# Patient Record
Sex: Female | Born: 1950 | Race: White | Hispanic: No | Marital: Single | Smoking: Never smoker
Health system: Southern US, Community
[De-identification: ages and names within clinical notes are randomized; demographics above are authoritative.]

## PROBLEM LIST (undated history)

## (undated) DIAGNOSIS — L02419 Cutaneous abscess of limb, unspecified: Secondary | ICD-10-CM

## (undated) DIAGNOSIS — L03119 Cellulitis of unspecified part of limb: Secondary | ICD-10-CM

## (undated) DIAGNOSIS — G35 Multiple sclerosis: Secondary | ICD-10-CM

## (undated) DIAGNOSIS — R32 Unspecified urinary incontinence: Secondary | ICD-10-CM

## (undated) DIAGNOSIS — D332 Benign neoplasm of brain, unspecified: Secondary | ICD-10-CM

## (undated) DIAGNOSIS — D696 Thrombocytopenia, unspecified: Secondary | ICD-10-CM

## (undated) DIAGNOSIS — R5383 Other fatigue: Secondary | ICD-10-CM

## (undated) DIAGNOSIS — G709 Myoneural disorder, unspecified: Secondary | ICD-10-CM

## (undated) DIAGNOSIS — I10 Essential (primary) hypertension: Secondary | ICD-10-CM

## (undated) DIAGNOSIS — Z9289 Personal history of other medical treatment: Secondary | ICD-10-CM

## (undated) DIAGNOSIS — G0491 Myelitis, unspecified: Secondary | ICD-10-CM

## (undated) DIAGNOSIS — Z9889 Other specified postprocedural states: Secondary | ICD-10-CM

## (undated) DIAGNOSIS — T4145XA Adverse effect of unspecified anesthetic, initial encounter: Secondary | ICD-10-CM

## (undated) DIAGNOSIS — T8859XA Other complications of anesthesia, initial encounter: Secondary | ICD-10-CM

## (undated) DIAGNOSIS — M1710 Unilateral primary osteoarthritis, unspecified knee: Secondary | ICD-10-CM

## (undated) DIAGNOSIS — C801 Malignant (primary) neoplasm, unspecified: Secondary | ICD-10-CM

## (undated) DIAGNOSIS — R112 Nausea with vomiting, unspecified: Secondary | ICD-10-CM

## (undated) DIAGNOSIS — W19XXXA Unspecified fall, initial encounter: Secondary | ICD-10-CM

## (undated) HISTORY — DX: Essential (primary) hypertension: I10

## (undated) HISTORY — DX: Benign neoplasm of brain, unspecified: D33.2

---

## 1999-06-11 DIAGNOSIS — G709 Myoneural disorder, unspecified: Secondary | ICD-10-CM

## 1999-06-11 HISTORY — DX: Myoneural disorder, unspecified: G70.9

## 2009-06-10 HISTORY — PX: ABDOMINAL HYSTERECTOMY: SHX81

## 2012-01-13 ENCOUNTER — Ambulatory Visit: Payer: Self-pay | Admitting: General Surgery

## 2012-04-24 ENCOUNTER — Ambulatory Visit: Payer: Self-pay | Admitting: Gastroenterology

## 2012-06-10 DIAGNOSIS — C801 Malignant (primary) neoplasm, unspecified: Secondary | ICD-10-CM

## 2012-06-10 HISTORY — PX: BREAST SURGERY: SHX581

## 2012-06-10 HISTORY — DX: Malignant (primary) neoplasm, unspecified: C80.1

## 2013-04-15 DIAGNOSIS — E669 Obesity, unspecified: Secondary | ICD-10-CM | POA: Insufficient documentation

## 2013-04-29 DIAGNOSIS — I10 Essential (primary) hypertension: Secondary | ICD-10-CM | POA: Insufficient documentation

## 2013-04-29 DIAGNOSIS — D320 Benign neoplasm of cerebral meninges: Secondary | ICD-10-CM | POA: Insufficient documentation

## 2013-04-30 DIAGNOSIS — D332 Benign neoplasm of brain, unspecified: Secondary | ICD-10-CM | POA: Insufficient documentation

## 2013-06-10 HISTORY — PX: BRAIN SURGERY: SHX531

## 2014-04-27 ENCOUNTER — Ambulatory Visit: Payer: Self-pay | Admitting: Hematology and Oncology

## 2014-04-27 LAB — LACTATE DEHYDROGENASE: LDH: 235 U/L (ref 81–246)

## 2014-04-27 LAB — FOLATE: FOLIC ACID: 18.5 ng/mL (ref 3.1–100.0)

## 2014-04-27 LAB — FIBRIN DEGRADATION PROD.(ARMC ONLY): Fibrin Degradation Prod.: <10 ug/ml (ref 2.1–7.7)

## 2014-04-27 LAB — CBC CANCER CENTER
Basophil: 1 %
Eosinophil: 2 %
HCT: 40.1 % (ref 35.0–47.0)
HGB: 12.8 g/dL (ref 12.0–16.0)
Lymphocytes: 31 %
MCH: 28.9 pg (ref 26.0–34.0)
MCHC: 31.8 g/dL — AB (ref 32.0–36.0)
MCV: 91 fL (ref 80–100)
Monocytes: 1 %
Platelet: 49 x10 3/mm — ABNORMAL LOW (ref 150–440)
RBC: 4.41 10*6/uL (ref 3.80–5.20)
RDW: 13.8 % (ref 11.5–14.5)
Segmented Neutrophils: 65 %
WBC: 6.7 x10 3/mm (ref 3.6–11.0)

## 2014-04-27 LAB — IRON AND TIBC
IRON SATURATION: 17 %
Iron Bind.Cap.(Total): 398 ug/dL (ref 250–450)
Iron: 68 ug/dL (ref 50–170)
Unbound Iron-Bind.Cap.: 330 ug/dL

## 2014-04-27 LAB — RETICULOCYTES
Absolute Retic Count: 0.0583 10*6/uL (ref 0.019–0.186)
RETICULOCYTE: 1.32 % (ref 0.4–3.1)

## 2014-04-27 LAB — FIBRINOGEN: FIBRINOGEN: 496 mg/dL — AB (ref 210–470)

## 2014-04-27 LAB — PROTIME-INR
INR: 1
PROTHROMBIN TIME: 12.7 s (ref 11.5–14.7)

## 2014-04-27 LAB — FERRITIN: Ferritin (ARMC): 75 ng/mL (ref 8–388)

## 2014-04-27 LAB — APTT: ACTIVATED PTT: 29.9 s (ref 23.6–35.9)

## 2014-04-29 LAB — URINE IEP, RANDOM

## 2014-04-29 LAB — PROT IMMUNOELECTROPHORES(ARMC)

## 2014-05-10 ENCOUNTER — Ambulatory Visit: Payer: Self-pay | Admitting: Hematology and Oncology

## 2014-06-10 ENCOUNTER — Ambulatory Visit: Payer: Self-pay | Admitting: Hematology and Oncology

## 2014-09-15 DIAGNOSIS — D332 Benign neoplasm of brain, unspecified: Secondary | ICD-10-CM

## 2014-09-15 HISTORY — DX: Benign neoplasm of brain, unspecified: D33.2

## 2015-02-24 DIAGNOSIS — I10 Essential (primary) hypertension: Secondary | ICD-10-CM | POA: Insufficient documentation

## 2015-02-24 DIAGNOSIS — G35 Multiple sclerosis: Secondary | ICD-10-CM | POA: Insufficient documentation

## 2015-02-24 DIAGNOSIS — E785 Hyperlipidemia, unspecified: Secondary | ICD-10-CM | POA: Insufficient documentation

## 2015-08-16 ENCOUNTER — Encounter: Payer: Self-pay | Admitting: Family Medicine

## 2015-08-16 ENCOUNTER — Ambulatory Visit (INDEPENDENT_AMBULATORY_CARE_PROVIDER_SITE_OTHER): Payer: Medicaid Other | Admitting: Family Medicine

## 2015-08-16 VITALS — BP 151/77 | HR 72 | Temp 98.3°F | Ht 66.6 in | Wt 303.0 lb

## 2015-08-16 DIAGNOSIS — S61259A Open bite of unspecified finger without damage to nail, initial encounter: Secondary | ICD-10-CM | POA: Diagnosis not present

## 2015-08-16 DIAGNOSIS — W5501XA Bitten by cat, initial encounter: Secondary | ICD-10-CM

## 2015-08-16 MED ORDER — CLINDAMYCIN HCL 300 MG PO CAPS: 300.0000 mg | ORAL_CAPSULE | Freq: Four times a day (QID) | ORAL | Status: DC

## 2015-08-16 MED ORDER — SULFAMETHOXAZOLE-TRIMETHOPRIM 800-160 MG PO TABS: 1.0000 | ORAL_TABLET | Freq: Two times a day (BID) | ORAL | Status: DC

## 2015-08-16 NOTE — Progress Notes (Signed)
BP 151/77 mmHg  Pulse 72  Temp(Src) 98.3 F (36.8 C)  Ht 5' 6.6" (1.692 m)  Wt 303 lb (137.44 kg)  BMI 48.01 kg/m2  SpO2 96%   Subjective:    Patient ID: Leslie Smith, female    DOB: 04-10-1951, 65 y.o.   MRN: [id number]  HPI: Leslie Smith is a 65 y.o. female  Chief Complaint  Patient presents with  . Animal Bite    left hand   patient with Bite to left thumb feels went down to the bone is treated and released from the emergency room placed on Augmentin 875 twice a day initially seemed to help some and then this not now some is gotten red inflamed tender no localization no fever Been getting significantly worse  Relevant past medical, surgical, family and social history reviewed and updated as indicated. Interim medical history since our last visit reviewed. Allergies and medications reviewed and updated.  Review of Systems  Constitutional: Positive for fatigue. Negative for fever.  HENT: Negative.   Respiratory: Negative.   Cardiovascular: Negative.     Per HPI unless specifically indicated above     Objective:    BP 151/77 mmHg  Pulse 72  Temp(Src) 98.3 F (36.8 C)  Ht 5' 6.6" (1.692 m)  Wt 303 lb (137.44 kg)  BMI 48.01 kg/m2  SpO2 96%  Wt Readings from Last 3 Encounters:  08/16/15 303 lb (137.44 kg)  09/15/14 299 lb (135.626 kg)    Physical Exam  Constitutional: She is oriented to person, place, and time. She appears well-developed and well-nourished. No distress.  HENT:  Head: Normocephalic and atraumatic.  Right Ear: Hearing normal.  Left Ear: Hearing normal.  Nose: Nose normal.  Eyes: Conjunctivae and lids are normal. Right eye exhibits no discharge. Left eye exhibits no discharge. No scleral icterus.  Pulmonary/Chest: Effort normal. No respiratory distress.  Musculoskeletal: Normal range of motion.  Neurological: She is alert and oriented to person, place, and time.  Skin: Skin is intact. No rash noted.  Left thumb with puncture wound inflamed  and swollen  Psychiatric: She has a normal mood and affect. Her speech is normal and behavior is normal. Judgment and thought content normal. Cognition and memory are normal.    Results for orders placed or performed in visit on 04/27/14  Prot. Immunoelectrophores [facility])  Result Value Ref Range   Prot Immunoelectrophores      PROT IMMUNOELECTROPHORES IFE and PE, Serum Immunoglobulin G, Qn, Serum     [   936 mg/dL            ]          [phone] Immunoglobulin A, Qn, Serum     [   214 mg/dL            ]            91-414 Immunoglobulin M, Qn, Serum     [   117 mg/dL            ]            40-230 Protein, Total, Serum           [   6.5 g/dL             ]           6.0-8.5 Albumin                         [  3.6 g/dL             ]           3.2-5.6 Alpha-1-Globulin                [   0.2 g/dL             ]           0.1-0.4 Alpha-2-Globulin                [   0.7 g/dL             ]           0.4-1.2 Beta Globulin                   [   1.0 g/dL             ]           0.6-1.3 Gamma Globulin                  [   0.9 g/dL             ]           0.5-1.6 M-Spike                         [   Not Observed g/dL    ]      Not Observed Globulin, Total                 [   2.9 g/dL             ]           2.0-4.5 A/G Ratio                       [   1.3                  ]           0.7-2.0 Immunofixation Resu lt, Serum    [   Final Report         ]                   An apparent normal immunofixation pattern. Please note:                    [   Final Report         ]                   Protein electrophoresis scan will follow via computer, mail, or courier delivery.               [place]            No: [id number]           [place], [place] [address]           Lindon Romp, MD         [phone]     Urine IEP, RANDOM  Result Value Ref Range   Urine IEP, Random      ========== TEST NAME ==========  ========= RESULTS =========  = REFERENCE RANGE =  URINE IEP,  RANDOM IFE and PE, Random Urine Protein,Total,Urine             [   10.1 mg/dL           ]  0.0-15.0 Albumin, U                      [   70.6 %               ]                   Alpha-1-Globulin, U             [   1.7 %                ]                   Alpha-2-Globulin, U             [   5.7 %                ]                   Beta Globulin, U                [   13.2 %               ]                   Gamma Globulin, U               [   8.8 %                ]                   M-Spike, %                      [   Not Observed %       ]      Not Observed Immunofixation Result, Urine    [   Final Report         ]                   An apparent normal immunofixation pattern.   Note:                         [   Final Report         ]                   Protein electrophoresis scan will follow via computer, mail, or courier delivery.               [place]             No: [id number]           [address], [place], [place] [address]           Lindon Romp, MD         [phone]     [facility]  Result Value Ref Range   WBC 6.7 3.6-11.0 x10 3/mm    RBC 4.41 3.80-5.20 x10 6/mm    HGB 12.8 12.0-16.0 g/dL   HCT 40.1 35.0-47.0 %   MCV 91 80-100 fL   MCH 28.9 26.0-34.0 pg   MCHC 31.8 (L) 32.0-36.0 g/dL   RDW 13.8 11.5-14.5 %   Platelet 49 (L) 150-440 x10 3/mm    Segmented Neutrophils 65 %   Lymphocytes 31 %   Monocytes 1 %   Eosinophil 2 %  Basophil 1 %   Comment - H1-Com1 HYPOCHROMIA    Comment - H1-Com2 PLTS VARIED IN SIZE   Reticulocytes  Result Value Ref Range   Reticulocyte 1.32 0.4-3.1 %   Absolute Retic Count 0.0583 0.019-0.186 x10 6/uL  Ferritin  Result Value Ref Range   Ferritin ([facility]) 75 8-388 ng/mL  Folate  Result Value Ref Range   Folic Acid [id number] 0000000 ng/mL  Lactate dehydrogenase  Result Value Ref Range   LDH 235 81-246 Unit/L  Protime-INR  Result Value Ref Range   Prothrombin Time 12.7 11.5-14.7 secs   INR 1.0   APTT   Result Value Ref Range   Activated PTT 29.9 23.6-35.9 secs  Fibrin Degradation Products [facility])  Result Value Ref Range   Fibrin Degradation Prod. < 10 2.1-7.7 ug/ml  Fibrinogen  Result Value Ref Range   Fibrinogen 496 (H) 210-470 mg/dL  Iron and TIBC  Result Value Ref Range   Iron 68 50-170 mcg/dL   Iron Bind.Cap.(Total) 398 250-450 mcg/dL   Unbound Iron-Bind.Cap. 330 mcg/dL   Iron Saturation 17 %      Assessment & Plan:   Problem List Items Addressed This Visit    None    Visit Diagnoses    Cat bite of finger, initial encounter    -  Primary    Discuss hot compresses use of antibiotics will give clindamycin and Septra if not better to orthopedics to consider I&D        Follow up plan: Return if symptoms worsen or fail to improve, for As scheduled.

## 2015-08-21 ENCOUNTER — Other Ambulatory Visit: Payer: Self-pay | Admitting: Family Medicine

## 2015-08-25 ENCOUNTER — Encounter (HOSPITAL_COMMUNITY): Payer: Self-pay | Admitting: Certified Registered Nurse Anesthetist

## 2015-08-25 ENCOUNTER — Ambulatory Visit (HOSPITAL_COMMUNITY)
Admission: RE | Admit: 2015-08-25 | Discharge: 2015-08-25 | Disposition: A | Discharge status: Home / Self Care | Payer: Medicaid Other | Source: Ambulatory Visit | Attending: Orthopedic Surgery | Admitting: Orthopedic Surgery

## 2015-08-25 ENCOUNTER — Encounter (HOSPITAL_COMMUNITY)
Admission: RE | Disposition: A | Discharge status: Home / Self Care | Payer: Self-pay | Source: Ambulatory Visit | Attending: Orthopedic Surgery

## 2015-08-25 ENCOUNTER — Ambulatory Visit (HOSPITAL_COMMUNITY): Payer: Medicaid Other | Admitting: Certified Registered Nurse Anesthetist

## 2015-08-25 ENCOUNTER — Telehealth: Payer: Self-pay | Admitting: Family Medicine

## 2015-08-25 ENCOUNTER — Other Ambulatory Visit: Payer: Self-pay | Admitting: Family Medicine

## 2015-08-25 DIAGNOSIS — S61052A Open bite of left thumb without damage to nail, initial encounter: Secondary | ICD-10-CM | POA: Diagnosis not present

## 2015-08-25 DIAGNOSIS — S66212A Strain of extensor muscle, fascia and tendon of left thumb at wrist and hand level, initial encounter: Secondary | ICD-10-CM | POA: Diagnosis not present

## 2015-08-25 DIAGNOSIS — L089 Local infection of the skin and subcutaneous tissue, unspecified: Secondary | ICD-10-CM | POA: Diagnosis not present

## 2015-08-25 DIAGNOSIS — W5501XA Bitten by cat, initial encounter: Secondary | ICD-10-CM | POA: Insufficient documentation

## 2015-08-25 DIAGNOSIS — E785 Hyperlipidemia, unspecified: Secondary | ICD-10-CM | POA: Insufficient documentation

## 2015-08-25 DIAGNOSIS — I10 Essential (primary) hypertension: Secondary | ICD-10-CM | POA: Diagnosis not present

## 2015-08-25 DIAGNOSIS — G35 Multiple sclerosis: Secondary | ICD-10-CM | POA: Diagnosis not present

## 2015-08-25 DIAGNOSIS — Z79899 Other long term (current) drug therapy: Secondary | ICD-10-CM | POA: Insufficient documentation

## 2015-08-25 DIAGNOSIS — W5501XD Bitten by cat, subsequent encounter: Secondary | ICD-10-CM

## 2015-08-25 HISTORY — DX: Nausea with vomiting, unspecified: R11.2

## 2015-08-25 HISTORY — PX: I & D EXTREMITY: SHX5045

## 2015-08-25 HISTORY — DX: Other specified postprocedural states: Z98.890

## 2015-08-25 HISTORY — DX: Other complications of anesthesia, initial encounter: T88.59XA

## 2015-08-25 HISTORY — DX: Adverse effect of unspecified anesthetic, initial encounter: T41.45XA

## 2015-08-25 LAB — BASIC METABOLIC PANEL WITH GFR
Anion gap: 18 — ABNORMAL HIGH (ref 5–15)
BUN: <5 mg/dL — ABNORMAL LOW (ref 6–20)
CO2: 8 mmol/L — ABNORMAL LOW (ref 22–32)
Calcium: 6.3 mg/dL — CL (ref 8.9–10.3)
Chloride: 108 mmol/L (ref 101–111)
Creatinine, Ser: <0.3 mg/dL — ABNORMAL LOW (ref 0.44–1.00)
Glucose, Bld: 21 mg/dL — CL (ref 65–99)
Potassium: 4.1 mmol/L (ref 3.5–5.1)
Sodium: 134 mmol/L — ABNORMAL LOW (ref 135–145)

## 2015-08-25 LAB — CBC
HCT: 14.2 % — ABNORMAL LOW (ref 36.0–46.0)
HEMATOCRIT: 25.6 % — AB (ref 36.0–46.0)
HEMOGLOBIN: 4.5 g/dL — AB (ref 12.0–15.0)
HEMOGLOBIN: 8.5 g/dL — AB (ref 12.0–15.0)
MCH: 28.3 pg (ref 26.0–34.0)
MCH: 29.2 pg (ref 26.0–34.0)
MCHC: 31.7 g/dL (ref 30.0–36.0)
MCHC: 33.2 g/dL (ref 30.0–36.0)
MCV: 89.3 fL (ref 78.0–100.0)
MCV: 88 fL (ref 78.0–100.0)
PLATELETS: 19 10*3/uL — AB (ref 150–400)
Platelets: 34 10*3/uL — ABNORMAL LOW (ref 150–400)
RBC: 1.59 MIL/uL — AB (ref 3.87–5.11)
RBC: 2.91 MIL/uL — ABNORMAL LOW (ref 3.87–5.11)
RDW: 14.2 % (ref 11.5–15.5)
RDW: 14.1 % (ref 11.5–15.5)
WBC: 3.9 10*3/uL — AB (ref 4.0–10.5)
WBC: 7.5 10*3/uL (ref 4.0–10.5)

## 2015-08-25 LAB — POCT I-STAT 4, (NA,K, GLUC, HGB,HCT)
Glucose, Bld: 58 mg/dL — ABNORMAL LOW (ref 65–99)
HCT: 24 % — ABNORMAL LOW (ref 36.0–46.0)
Hemoglobin: 8.2 g/dL — ABNORMAL LOW (ref 12.0–15.0)
Potassium: 4.1 mmol/L (ref 3.5–5.1)
Sodium: 136 mmol/L (ref 135–145)

## 2015-08-25 SURGERY — IRRIGATION AND DEBRIDEMENT EXTREMITY
Anesthesia: Monitor Anesthesia Care | Laterality: Left

## 2015-08-25 MED ORDER — OXYCODONE HCL 5 MG PO TABS
ORAL_TABLET | ORAL | Status: AC
  Administered 2015-08-25: 5 mg via ORAL
  Filled 2015-08-25: qty 1

## 2015-08-25 MED ORDER — OXYCODONE-ACETAMINOPHEN 5-325 MG PO TABS: 1.0000 | ORAL_TABLET | ORAL | Status: DC | PRN

## 2015-08-25 MED ORDER — HYDROMORPHONE HCL 1 MG/ML IJ SOLN
INTRAMUSCULAR | Status: AC
  Administered 2015-08-25: 0.5 mg via INTRAVENOUS
  Filled 2015-08-25: qty 1

## 2015-08-25 MED ORDER — OXYCODONE HCL 5 MG/5ML PO SOLN: 5.0000 mg | Freq: Once | ORAL | Status: AC | PRN

## 2015-08-25 MED ORDER — BUPIVACAINE HCL (PF) 0.25 % IJ SOLN
INTRAMUSCULAR | Status: DC | PRN
  Administered 2015-08-25: 20 mL

## 2015-08-25 MED ORDER — OXYCODONE-ACETAMINOPHEN 5-325 MG PO TABS
1.0000 | ORAL_TABLET | Freq: Once | ORAL | Status: AC
  Administered 2015-08-25: 1 via ORAL

## 2015-08-25 MED ORDER — LACTATED RINGERS IV SOLN
INTRAVENOUS | Status: DC
  Administered 2015-08-25 (×2): via INTRAVENOUS

## 2015-08-25 MED ORDER — AMOXICILLIN-POT CLAVULANATE 875-125 MG PO TABS: 1.0000 | ORAL_TABLET | Freq: Two times a day (BID) | ORAL | Status: DC

## 2015-08-25 MED ORDER — CHLORHEXIDINE GLUCONATE 4 % EX LIQD: 60.0000 mL | Freq: Once | CUTANEOUS | Status: DC

## 2015-08-25 MED ORDER — PROPOFOL 10 MG/ML IV BOLUS
INTRAVENOUS | Status: AC
  Filled 2015-08-25: qty 20

## 2015-08-25 MED ORDER — MIDAZOLAM HCL 2 MG/2ML IJ SOLN
INTRAMUSCULAR | Status: AC
  Filled 2015-08-25: qty 2

## 2015-08-25 MED ORDER — FENTANYL CITRATE (PF) 100 MCG/2ML IJ SOLN
INTRAMUSCULAR | Status: DC | PRN
  Administered 2015-08-25: 50 ug via INTRAVENOUS

## 2015-08-25 MED ORDER — FENTANYL CITRATE (PF) 250 MCG/5ML IJ SOLN
INTRAMUSCULAR | Status: AC
  Filled 2015-08-25: qty 5

## 2015-08-25 MED ORDER — OXYCODONE HCL 5 MG PO TABS
5.0000 mg | ORAL_TABLET | Freq: Once | ORAL | Status: AC | PRN
  Administered 2015-08-25: 5 mg via ORAL

## 2015-08-25 MED ORDER — HYDROMORPHONE HCL 1 MG/ML IJ SOLN
0.2500 mg | INTRAMUSCULAR | Status: DC | PRN
  Administered 2015-08-25 (×2): 0.5 mg via INTRAVENOUS

## 2015-08-25 MED ORDER — SODIUM CHLORIDE 0.9 % IR SOLN
Status: DC | PRN
  Administered 2015-08-25: 1000 mL

## 2015-08-25 MED ORDER — MEPERIDINE HCL 25 MG/ML IJ SOLN: 6.2500 mg | INTRAMUSCULAR | Status: DC | PRN

## 2015-08-25 MED ORDER — OXYCODONE-ACETAMINOPHEN 5-325 MG PO TABS
ORAL_TABLET | ORAL | Status: AC
  Filled 2015-08-25: qty 1

## 2015-08-25 MED ORDER — BUPIVACAINE HCL (PF) 0.25 % IJ SOLN
INTRAMUSCULAR | Status: AC
  Filled 2015-08-25: qty 30

## 2015-08-25 MED ORDER — MIDAZOLAM HCL 5 MG/5ML IJ SOLN
INTRAMUSCULAR | Status: DC | PRN
  Administered 2015-08-25 (×2): 1 mg via INTRAVENOUS

## 2015-08-25 MED ORDER — SCOPOLAMINE 1 MG/3DAYS TD PT72
MEDICATED_PATCH | TRANSDERMAL | Status: AC
  Administered 2015-08-25: 1.5 mg
  Filled 2015-08-25: qty 1

## 2015-08-25 SURGICAL SUPPLY — 57 items
BANDAGE ACE 4X5 VEL STRL LF (GAUZE/BANDAGES/DRESSINGS) ×3 IMPLANT
BANDAGE ELASTIC 3 VELCRO ST LF (GAUZE/BANDAGES/DRESSINGS) IMPLANT
BANDAGE ELASTIC 4 VELCRO ST LF (GAUZE/BANDAGES/DRESSINGS) IMPLANT
BNDG COHESIVE 1X5 TAN STRL LF (GAUZE/BANDAGES/DRESSINGS) IMPLANT
BNDG CONFORM 2 STRL LF (GAUZE/BANDAGES/DRESSINGS) ×6 IMPLANT
BNDG ESMARK 4X9 LF (GAUZE/BANDAGES/DRESSINGS) ×3 IMPLANT
BNDG GAUZE ELAST 4 BULKY (GAUZE/BANDAGES/DRESSINGS) IMPLANT
CORDS BIPOLAR (ELECTRODE) ×3 IMPLANT
COVER SURGICAL LIGHT HANDLE (MISCELLANEOUS) ×3 IMPLANT
CUFF TOURNIQUET SINGLE 18IN (TOURNIQUET CUFF) ×3 IMPLANT
CUFF TOURNIQUET SINGLE 24IN (TOURNIQUET CUFF) IMPLANT
DRAIN PENROSE 1/4X12 LTX STRL (WOUND CARE) IMPLANT
DRAPE SURG 17X23 STRL (DRAPES) ×3 IMPLANT
DRSG ADAPTIC 3X8 NADH LF (GAUZE/BANDAGES/DRESSINGS) IMPLANT
ELECT REM PT RETURN 9FT ADLT (ELECTROSURGICAL)
ELECTRODE REM PT RTRN 9FT ADLT (ELECTROSURGICAL) IMPLANT
GAUZE SPONGE 4X4 12PLY STRL (GAUZE/BANDAGES/DRESSINGS) ×3 IMPLANT
GAUZE XEROFORM 1X8 LF (GAUZE/BANDAGES/DRESSINGS) ×3 IMPLANT
GAUZE XEROFORM 5X9 LF (GAUZE/BANDAGES/DRESSINGS) IMPLANT
GLOVE BIOGEL PI IND STRL 8.5 (GLOVE) ×1 IMPLANT
GLOVE BIOGEL PI INDICATOR 8.5 (GLOVE) ×2
GLOVE SURG ORTHO 8.0 STRL STRW (GLOVE) ×3 IMPLANT
GOWN STRL REUS W/ TWL LRG LVL3 (GOWN DISPOSABLE) ×3 IMPLANT
GOWN STRL REUS W/ TWL XL LVL3 (GOWN DISPOSABLE) ×1 IMPLANT
GOWN STRL REUS W/TWL LRG LVL3 (GOWN DISPOSABLE) ×6
GOWN STRL REUS W/TWL XL LVL3 (GOWN DISPOSABLE) ×2
HANDPIECE INTERPULSE COAX TIP (DISPOSABLE)
KIT BASIN OR (CUSTOM PROCEDURE TRAY) ×3 IMPLANT
KIT ROOM TURNOVER OR (KITS) ×3 IMPLANT
MANIFOLD NEPTUNE II (INSTRUMENTS) ×3 IMPLANT
NEEDLE HYPO 25GX1X1/2 BEV (NEEDLE) ×3 IMPLANT
NS IRRIG 1000ML POUR BTL (IV SOLUTION) ×3 IMPLANT
PACK ORTHO EXTREMITY (CUSTOM PROCEDURE TRAY) ×3 IMPLANT
PAD ARMBOARD 7.5X6 YLW CONV (MISCELLANEOUS) ×6 IMPLANT
PAD CAST 4YDX4 CTTN HI CHSV (CAST SUPPLIES) ×1 IMPLANT
PADDING CAST COTTON 4X4 STRL (CAST SUPPLIES) ×2
SET HNDPC FAN SPRY TIP SCT (DISPOSABLE) IMPLANT
SOAP 2 % CHG 4 OZ (WOUND CARE) ×3 IMPLANT
SPLINT FIBERGLASS 3X12 (CAST SUPPLIES) ×3 IMPLANT
SPONGE LAP 18X18 X RAY DECT (DISPOSABLE) ×3 IMPLANT
SPONGE LAP 4X18 X RAY DECT (DISPOSABLE) ×3 IMPLANT
SUCTION FRAZIER HANDLE 10FR (MISCELLANEOUS) ×2
SUCTION TUBE FRAZIER 10FR DISP (MISCELLANEOUS) ×1 IMPLANT
SUT ETHIBOND 3-0 V-5 (SUTURE) ×3 IMPLANT
SUT ETHILON 4 0 PS 2 18 (SUTURE) IMPLANT
SUT ETHILON 5 0 P 3 18 (SUTURE)
SUT NYLON ETHILON 5-0 P-3 1X18 (SUTURE) IMPLANT
SUT PROLENE 4 0 PS 2 18 (SUTURE) ×3 IMPLANT
SYR CONTROL 10ML LL (SYRINGE) IMPLANT
TOWEL OR 17X24 6PK STRL BLUE (TOWEL DISPOSABLE) ×3 IMPLANT
TOWEL OR 17X26 10 PK STRL BLUE (TOWEL DISPOSABLE) ×3 IMPLANT
TUBE ANAEROBIC SPECIMEN COL (MISCELLANEOUS) IMPLANT
TUBE CONNECTING 12'X1/4 (SUCTIONS) ×1
TUBE CONNECTING 12X1/4 (SUCTIONS) ×2 IMPLANT
UNDERPAD 30X30 INCONTINENT (UNDERPADS AND DIAPERS) ×3 IMPLANT
WATER STERILE IRR 1000ML POUR (IV SOLUTION) ×3 IMPLANT
YANKAUER SUCT BULB TIP NO VENT (SUCTIONS) ×3 IMPLANT

## 2015-08-25 NOTE — OR Nursing (Signed)
Critical value of Hgb 4.5 and Plt count of 19 recvd from lab. Report forwarded via phone to [place] and Dr. Al Corpus in [place] room 7.  Report verified on computer results with them.

## 2015-08-25 NOTE — Anesthesia Preprocedure Evaluation (Signed)
Anesthesia Evaluation  Patient identified by MRN, date of birth, ID band Patient awake    Reviewed: Allergy & Precautions, NPO status , Patient's Chart, lab work & pertinent test results  History of Anesthesia Complications (+) PONV  Airway Mallampati: I  TM Distance: >3 FB Neck ROM: Full    Dental  (+) Teeth Intact, Dental Advisory Given   Pulmonary    breath sounds clear to auscultation       Cardiovascular hypertension, Pt. on medications  Rhythm:Regular Rate:Normal     Neuro/Psych    GI/Hepatic   Endo/Other  Morbid obesity  Renal/GU      Musculoskeletal   Abdominal   Peds  Hematology   Anesthesia Other Findings   Reproductive/Obstetrics                             Anesthesia Physical Anesthesia Plan  ASA: III  Anesthesia Plan: General   Post-op Pain Management:    Induction: Intravenous  Airway Management Planned: LMA  Additional Equipment:   Intra-op Plan:   Post-operative Plan: Extubation in OR  Informed Consent: I have reviewed the patients History and Physical, chart, labs and discussed the procedure including the risks, benefits and alternatives for the proposed anesthesia with the patient or authorized representative who has indicated his/her understanding and acceptance.   Dental advisory given  Plan Discussed with: CRNA, Anesthesiologist and Surgeon  Anesthesia Plan Comments:         Anesthesia Quick Evaluation

## 2015-08-25 NOTE — Transfer of Care (Signed)
Immediate Anesthesia Transfer of Care Note  Patient: Leslie Smith  Procedure(s) Performed: Procedure(s): IRRIGATION AND DEBRIDEMENT THUMB (Left)  Patient Location: PACU  Anesthesia Type:MAC  Level of Consciousness: awake, alert  and oriented  Airway & Oxygen Therapy: Patient Spontanous Breathing  Post-op Assessment: Report given to RN and Post -op Vital signs reviewed and stable  Post vital signs: Reviewed and stable  Last Vitals:  Filed Vitals:   08/25/15 1658 08/25/15 1822  BP: 174/47   Pulse: 75 70  Temp: 36.8 C 36.3 C  Resp: 22 16    Complications: No apparent anesthesia complications

## 2015-08-25 NOTE — Op Note (Signed)
NAMEKARIANA, SIELSKI NO.:  [id number]  MEDICAL RECORD NO.:  [id number]  LOCATION:  [facility]                         FACILITY:  [facility]  PHYSICIAN:  Linna Hoff IV, M.D.DATE OF BIRTH:  [birth year]  DATE OF PROCEDURE: DATE OF DISCHARGE:  08/25/2015                              OPERATIVE REPORT   PREOPERATIVE DIAGNOSIS:  Left thumb cat bite with infection.  POSTOPERATIVE DIAGNOSIS: 1. Left thumb cat bite with infection. 2. Left thumb EPL tendon rupture.  SURGEON:  Linna Hoff, M.D., who scrubbed and present for the entire procedure.  ASSISTANT SURGEON:  None.  ANESTHESIA:  4% Marcaine, local block with IV sedation.  INDICATIONS:  Ms. Kinghorn is a right-hand dominant female presented to the office with worsening infection over the dorsal aspect of the left thumb.  The patient was seen and evaluated and recommended to undergo the above procedure.  Risks, benefits, and alternatives were discussed in detail with the patient.  Signed informed consent was obtained. Risks include, but not limited to bleeding; infection; damage to nearby nerves, arteries, or tendons; loss of motion of wrist and digits; incomplete relief of symptoms; and need for further surgical intervention.  DESCRIPTION OF PROCEDURE:  The patient was properly identified in the preoperative holding area and marked by permanent marker on left thumb to indicate correct operative site.  The patient was brought back to the operating room, placed supine on anesthesia table where IV sedation was administered, local anesthetic was administered.  Well-padded tourniquet placed on the left forearm and sealed with 1000 drape.  Left upper extremity was prepped and draped in normal sterile fashion.  Time-out was called, correct site was identified and procedure then begun. Attention turned to the left thumb.  A longitudinal incision was made directly over the thumb MP joint.  Dissection was carried down  through the skin and subcutaneous tissue.  Small amount of purulence was encounter dorsally.  The patient did have a complete rupture of the EPL tendon.  The wound was then irrigated.  After thorough wound irrigation and drainage of the abscess area, the tendon was then repaired with 4-0 Ethibond suture figure-of-eight horizontal mattress sutures.  The wound was then irrigated, copious wound irrigation.  The skin was then closed loosely with a 4-0 Prolene sutures.  Xeroform dressing and sterile compressive bandage then applied.  The patient tolerated the procedure well.  Returned to the recovery room in good condition after being placed in a thumb spica splint.  POSTPROCEDURAL PLAN:  The patient will be discharged home and seen back in the office in approximately 3 days for wound check, application of a cast for total of 4 weeks cast immobilization.  Begin an outpatient therapy regimen at the 4-week mark and protect the tendon given the infection.     Melrose Nakayama, M.D.     FWO/MEDQ  D:  08/25/2015  T:  08/25/2015  Job:  [id number]

## 2015-08-25 NOTE — Discharge Instructions (Signed)
KEEP BANDAGE CLEAN AND DRY CALL OFFICE FOR F/U APPT [phone] ON Monday 3/20 KEEP HAND ELEVATED ABOVE HEART OK TO APPLY ICE TO OPERATIVE AREA CONTACT OFFICE IF ANY WORSENING PAIN OR CONCERNS.

## 2015-08-25 NOTE — H&P (Signed)
Leslie Smith is an 65 y.o. female.   Chief Complaint: left thumb pain and swelling HPI: pt with cat bite several days ago, been on abx Presented to office with persistent infection No prior surgery to left thumb  Past Medical History  Diagnosis Date  . Multiple sclerosis ([place])   . Hypertension   . Hyperlipidemia   . Complication of anesthesia   . PONV (postoperative nausea and vomiting)     Past Surgical History  Procedure Laterality Date  . Abdominal hysterectomy    . Breast surgery Left     biopsy  . 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 Heart disease Father   . Cancer Father    Social History:  reports that she has never smoked. She has never used smokeless tobacco. She reports that she drinks alcohol. She reports that she uses illicit drugs (Marijuana).  Allergies:  Allergies  Allergen Reactions  . Atorvastatin Other (See Comments)    Word finding  . Meloxicam Nausea Only    Medications Prior to Admission  Medication Sig Dispense Refill  . ibuprofen (ADVIL,MOTRIN) 800 MG tablet Take 800 mg by mouth every 8 (eight) hours as needed for mild pain or moderate pain.     Marland Kitchen losartan (COZAAR) 100 MG tablet TAKE ONE TABLET BY MOUTH EVERY DAY 30 tablet 6  . sulfamethoxazole-trimethoprim (BACTRIM DS,SEPTRA DS) 800-160 MG tablet Take 1 tablet by mouth 2 (two) times daily. 20 tablet 0  . tiZANidine (ZANAFLEX) 4 MG capsule Take 4 mg by mouth 3 (three) times daily as needed for muscle spasms.       No results found for this or any previous visit (from the past 48 hour(s)). No results found.  ROS NO RECENT ILLNESSES OR HOSPITALIZATIONS   Blood pressure 174/47, pulse 75, temperature 98.3 F (36.8 C), temperature source Oral, resp. rate 22, SpO2 100 %. Physical Exam  General Appearance:  Alert, cooperative, no distress, appears stated age  Head:  Normocephalic, without obvious abnormality, atraumatic   Eyes:  Pupils equal, conjunctiva/corneas clear,         Throat: Lips, mucosa, and tongue normal; teeth and gums normal  Neck: No visible masses     Lungs:   respirations unlabored  Chest Wall:  No tenderness or deformity  Heart:  Regular rate and rhythm,  Abdomen:   Soft, non-tender,         Extremities: LEFT THUMB: SMALL FLUCTUANT AREA OVER DORSUM OF THUMB  MP JOINT, FINGERS WARM WELL PERFUSED GOOD WRIST AND DIGITAL MOTION  Pulses: 2+ and symmetric  Skin: Skin color, texture, turgor normal, no rashes or lesions     Neurologic: Normal     Assessment/Plan LEFT THUMB CAT BITE WITH INFECTION  LEFT THUMB INCISION AND DRAINAGE   R/B/A DISCUSSED WITH PT IN OFFICE.  PT VOICED UNDERSTANDING OF PLAN CONSENT SIGNED DAY OF SURGERY PT SEEN AND EXAMINED PRIOR TO OPERATIVE PROCEDURE/DAY OF SURGERY SITE MARKED. QUESTIONS ANSWERED WILL GO HOME FOLLOWING SURGERY  WE ARE PLANNING SURGERY FOR YOUR UPPER EXTREMITY. THE RISKS AND BENEFITS OF SURGERY INCLUDE BUT NOT LIMITED TO BLEEDING INFECTION, DAMAGE TO NEARBY NERVES ARTERIES TENDONS, FAILURE OF SURGERY TO ACCOMPLISH ITS INTENDED GOALS, PERSISTENT SYMPTOMS AND NEED FOR FURTHER SURGICAL INTERVENTION. WITH THIS IN MIND WE WILL PROCEED. I HAVE DISCUSSED WITH THE PATIENT THE PRE AND POSTOPERATIVE REGIMEN AND THE DOS AND DON'TS. PT VOICED UNDERSTANDING AND INFORMED  CONSENT SIGNED.  Linna Hoff 08/25/2015, 5:35 PM

## 2015-08-25 NOTE — Anesthesia Postprocedure Evaluation (Signed)
Anesthesia Post Note  Patient: Leslie Smith  Procedure(s) Performed: Procedure(s) (LRB): IRRIGATION AND DEBRIDEMENT THUMB (Left)  Patient location during evaluation: PACU Anesthesia Type: MAC Level of consciousness: awake and alert Pain management: pain level controlled Vital Signs Assessment: post-procedure vital signs reviewed and stable Respiratory status: spontaneous breathing, nonlabored ventilation and respiratory function stable Cardiovascular status: stable and blood pressure returned to baseline Anesthetic complications: no    Last Vitals:  Filed Vitals:   08/25/15 1915 08/25/15 1940  BP: 135/67   Pulse: 71 76  Temp:  36.4 C  Resp: 18     Last Pain:  Filed Vitals:   08/25/15 1943  PainSc: 0-No pain                 Mahad Newstrom,W. EDMOND

## 2015-08-25 NOTE — Telephone Encounter (Signed)
Pt needs a ref to see Dr Caralyn Guile at [facility] orthopedics

## 2015-08-25 NOTE — Brief Op Note (Signed)
08/25/2015  5:37 PM  PATIENT:  Leslie Smith  65 y.o. female  PRE-OPERATIVE DIAGNOSIS:  cat bite infection  POST-OPERATIVE DIAGNOSIS:  cat bite infection  PROCEDURE:  Procedure(s): IRRIGATION AND DEBRIDEMENT THUMB (Left)  SURGEON:  Surgeon(s) and Role:    * Iran Planas, MD - Primary  PHYSICIAN ASSISTANT:   ASSISTANTS: none   ANESTHESIA:   local  EBL:     BLOOD ADMINISTERED:none  DRAINS: none   LOCAL MEDICATIONS USED:  MARCAINE     SPECIMEN:  No Specimen  DISPOSITION OF SPECIMEN:  N/A  COUNTS:  YES  TOURNIQUET:    DICTATION: .Other Dictation: Dictation Number [id number]  PLAN OF CARE: Discharge to home after PACU  PATIENT DISPOSITION:  PACU - hemodynamically stable.   Delay start of Pharmacological VTE agent (>24hrs) due to surgical blood loss or risk of bleeding: not applicable

## 2015-08-25 NOTE — Progress Notes (Signed)
Report to Virgina Organ, RN (pacu)

## 2015-08-25 NOTE — Telephone Encounter (Signed)
Urgent referral put in.

## 2015-08-28 ENCOUNTER — Encounter (HOSPITAL_COMMUNITY): Payer: Self-pay | Admitting: Orthopedic Surgery

## 2015-08-28 LAB — WOUND CULTURE: CULTURE: NO GROWTH

## 2015-08-28 NOTE — Telephone Encounter (Signed)
Call pt 

## 2015-08-31 LAB — ANAEROBIC CULTURE

## 2015-09-18 ENCOUNTER — Ambulatory Visit: Payer: Medicaid Other | Admitting: Family Medicine

## 2015-09-27 ENCOUNTER — Ambulatory Visit: Payer: Medicaid Other | Attending: Orthopedic Surgery | Admitting: Occupational Therapy

## 2015-09-27 ENCOUNTER — Encounter: Payer: Self-pay | Admitting: Occupational Therapy

## 2015-09-27 DIAGNOSIS — M7989 Other specified soft tissue disorders: Secondary | ICD-10-CM | POA: Diagnosis not present

## 2015-09-27 DIAGNOSIS — M25632 Stiffness of left wrist, not elsewhere classified: Secondary | ICD-10-CM | POA: Insufficient documentation

## 2015-09-27 DIAGNOSIS — M79642 Pain in left hand: Secondary | ICD-10-CM | POA: Diagnosis present

## 2015-09-27 NOTE — Therapy (Signed)
[facility] [address] [place], [place], [address] Phone: [phone]   Fax:  [phone]  Occupational Therapy Evaluation  Patient Details  Name: Leslie Smith MRN: [id number] Date of Birth: [birth year]  Date of Injury: 08/13/15 Date of Surgery: 08/25/15 S/p surgical repair left EPL tendon (Extensor Pollicus Longus Tendon [id number] Codes [id number]; [phone]) Referring MD: Dr Caralyn Guile  Encounter Date: 09/27/2015      OT End of Session - 09/27/15 1216    Authorization Time Period --  ICD 10 code: [id number]; [id number]      Past Medical History  Diagnosis Date  . Multiple sclerosis ([place]) 12/08/2002  . Hypertension 02/06/2012  . Hyperlipidemia 02/06/2012  . Complication of anesthesia   . PONV (postoperative nausea and vomiting)   . 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[place]) 09/15/2014    Past Surgical History  Procedure Laterality Date  . Abdominal hysterectomy    . Breast surgery Left     biopsy  . Brain surgery  Nov. 2014    tumor resection-denies seizures  . I&d extremity Left 08/25/2015    Procedure: IRRIGATION AND DEBRIDEMENT THUMB;  Surgeon: Iran Planas, MD;  Location: [facility];  Service: Orthopedics;  Laterality: Left;    There were no vitals filed for this visit.      Subjective Assessment - 09/27/15 1030    Subjective  Pt sustained a EPL laceration on 08/13/15 when she sustained a cat bite. Pt underwent EPL laceration/tendon repair on 08/25/15 by Dr Caralyn Guile. She is now 4 weeks and 5 days s/p surgery and presents for protective splinting and treatment per tendon healing protocol EPL repair L non-dominant hand. Pt has signicant PMH including MS (see PMH for details).    Pertinent History See PMH   Currently in Pain? Yes   Pain Score 2    Pain Location Hand   Pain Orientation Left   Pain Descriptors / Indicators Discomfort   Pain Type Surgical pain   Pain Onset More than a month ago   Pain Frequency Occasional   Pain Relieving Factors  Elevation of hand   Multiple Pain Sites No           [facility] OT Assessment - 09/27/15 0001    ADL   ADL comments Pt is a right hand dominant female s/p Left EPL repair on 08/25/15 (DOI 08/13/15) She also has MS & reports inability to use left hand as assist during ADL's.   Pt is 4 weeks and 5 days post op today.   Observation/Other Assessments   Observations Pt w/ well healed scar along left dorsal thumb, no open areas or wounds noted.  Pt was educated in gentle scar mobilization/massage 1-2x/day   Coordination   Fine Motor Movements are Fluid and Coordinated Not tested  NT secondary to recent EPL repair.   Edema   Edema None noted per visual observation today & compared to right dominant hand.   ROM / Strength   AROM / PROM / Strength --  NT secondary to 4 weeks and 5 days post-op EPL repair left    Hand Function   Left Hand Gross Grasp Impaired   Left Hand Grip (lbs) Impaired/NT secondary to EPL repair Left; No functional use left hand at this time. Protective splinting & gentle ARO Mper protocol only at this time, as per tendon protocol.   Comment Pt was edicated in no functional use left hand, wear protective splint and remove only per protocol for gentle AROM baesd on tendon  healing s/p repair.  left                  OT Treatments/Exercises (OP) - 09/27/15 0001    Exercises   Exercises --   Hand Exercises   Other Hand Exercises Pt was educated to wear splint at all times except for hand hygeine and home program per protocol (AROM to wrist and thumb, focus on wrist and thumb MP in slight flexion & attempting full active IP extension to assist in enhancing EPL excursion).  Pt verbalized understanding; will need review/follow up   Splinting   Splinting A protective splint was fabricated for pt left hand/thumb s/p EPL repair on 08/25/15. Splint places left hand in slight wrist extension w/ L thumb slightly rotated and abducted and IP in slight hyper extension as per  protocol.  Pt was educated in splint use, care & precautions.   Manual Therapy   Manual Therapy Soft tissue mobilization;Other (comment)  Gentle scar mobilization w/ Lthumb in protected position               OT Education - 09/27/15 1155    Education provided Yes   Education Details Left hand/thumb Splint wear and care; scar management; gentle AROM per EPL protocol. Pt is currently 4 weeks and 5 days post op today.   Person(s) Educated Patient   Methods Explanation;Demonstration;Verbal cues;Handout   Comprehension Verbalized understanding;Verbal cues required;Need further instruction          OT Short Term Goals - 09/27/15 1216    OT SHORT TERM GOAL #1   Title Pt wil lbe I protective splint use, care and precautions left hand/thumb   Baseline requires verbal and tactle cues   Time 3   Period Weeks   Status New   OT SHORT TERM GOAL #2   Title Pt will be mod I home program per tendon healing extensor pollicus longus repair   Baseline Verbal and tactile cues required at eval   Time 3   Period Weeks   Status New           OT Long Term Goals - 09/27/15 1218    OT LONG TERM GOAL #1   Title Pt will demonstrate scar mobility w/o limitations as seen by AROM left thumb WFL's and ability to oppose to base of 5th finger   Time 6   Period Weeks   Status New   OT LONG TERM GOAL #2   Title Pt wil lbe Mod I upgraded HEP left hand/thumb   Time 6   Period Weeks   Status New   OT LONG TERM GOAL #3   Title Pt will be Mod I ADL's using left hand as co-assist during ADL's and self care tasks as seen by reports of pain 0-1/10.   Baseline 2/10 initially   Time 6   Period Weeks   Status New   OT LONG TERM GOAL #4   Title Pt will demonstrate functional extension of left thumb IP w/o extensor lag noted, and functional grip left thumb for prehension/daily activities.   Baseline Impaired/NT secondary to no functional use at eval.   Time 6   Period Weeks   Status New                Plan - 09/27/15 1159    Clinical Impression Statement Pt is a 65 y/o female s/p left non-dominant hand EPL repair (ICD10 code [phone]; [phone]) on 08/25/15 (DOI: 08/13/15). She presents today at 4 weeks  and 5 days post-op for protective splinting and AROM/progression of program per tendon healing/protocol. A protective static splint was fabricated placing her left wrist in slight extension and thumb slightly rotation and abducted with IP in slight hyperextension for tendon healing.Pt was educated in splint use, care and precautions. and gentle AROM was initiated as per protocol. Pt will benefit from ongoing out-pt OT for pt education, HEP, splinting adjustments and  progression of program as per tendon healing for eventual return to functional use left non-dominant hand to WFL's for ADL's, IADL's and self care tasks secondary to recent surgical repair.    See PMH for PMH details as well.   Rehab Potential Good   OT Frequency Other (comment)  1-2 times per week for up to 6-8 visits   OT Duration 8 weeks   OT Treatment/Interventions Self-care/ADL training;Scar mobilization;DME and/or AE instruction;Parrafin;Patient/family education;Fluidtherapy;Splinting;Therapeutic exercises;Manual Therapy;Therapeutic exercise;Moist Heat;Therapeutic activities   Plan Pt requested f/u visits in [place], [place] [facility]. That office was called and pt to f/u w/ hand therapy/OT at that location which is closer to her home. Splint chek and review/upgrade HEP PRN per protocol.   Consulted and Agree with Plan of Care Patient      Patient will benefit from skilled therapeutic intervention in order to improve the following deficits and impairments:  Decreased strength, Pain, Decreased activity tolerance, Decreased mobility, Impaired UE functional use, Increased edema, Decreased range of motion, Decreased coordination, Impaired flexibility, Decreased scar mobility  Visit Diagnosis: Other  specified soft tissue disorders - Plan: Ot plan of care cert/re-cert  Stiffness of left wrist, not elsewhere classified - Plan: Ot plan of care cert/re-cert  Pain in left hand - Plan: Ot plan of care cert/re-cert    Problem List Patient Active Problem List   Diagnosis Date Noted  . Multiple sclerosis ([place])   . Hypertension   . Hyperlipidemia     Aaylah Pokorny Ardath Sax, OTR/L 09/27/2015, 12:37 PM  [facility] [address] [place], [place], [address] Phone: [phone]   Fax:  [phone]  Name: MORTICIA ODRISCOLL MRN: [id number] Date of Birth: 1950/10/13

## 2015-09-27 NOTE — Patient Instructions (Signed)
Left hand/thumb Splint wear and care; scar management; gentle AROM per EPL protocol. Pt is currently 4 weeks and 5 days post op today. Pt verbalized understanding.

## 2015-10-02 ENCOUNTER — Ambulatory Visit: Payer: Medicaid Other | Admitting: Occupational Therapy

## 2015-10-17 ENCOUNTER — Ambulatory Visit: Payer: Medicaid Other | Attending: Orthopedic Surgery | Admitting: Occupational Therapy

## 2015-10-17 ENCOUNTER — Encounter: Payer: Self-pay | Admitting: Occupational Therapy

## 2015-10-17 DIAGNOSIS — M7989 Other specified soft tissue disorders: Secondary | ICD-10-CM | POA: Diagnosis present

## 2015-10-17 DIAGNOSIS — M79642 Pain in left hand: Secondary | ICD-10-CM | POA: Diagnosis present

## 2015-10-17 DIAGNOSIS — M25632 Stiffness of left wrist, not elsewhere classified: Secondary | ICD-10-CM | POA: Diagnosis not present

## 2015-10-17 NOTE — Therapy (Signed)
[facility] [address] [place], [place], [address] Phone: [phone]   Fax:  [phone]  Occupational Therapy Treatment  Patient Details  Name: Leslie Smith MRN: [id number] Date of Birth: [birth year] No Data Recorded  Encounter Date: 10/17/2015      OT End of Session - 10/17/15 1156    Visit Number 2   Date for OT Re-Evaluation 10/27/15   Authorization Type MCD    Authorization Time Period Approved 3 visits from 10/03/15 - 11/27/15   Authorization - Visit Number 1   Authorization - Number of Visits 3   OT Start Time 1100   OT Stop Time 1145   OT Time Calculation (min) 45 min   Activity Tolerance Patient tolerated treatment well      Past Medical History  Diagnosis Date  . Multiple sclerosis ([facility]) 12/08/2002  . Hypertension 02/06/2012  . Hyperlipidemia 02/06/2012  . Complication of anesthesia   . PONV (postoperative nausea and vomiting)   . 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[facility]) 09/15/2014    Past Surgical History  Procedure Laterality Date  . Abdominal hysterectomy    . Breast surgery Left     biopsy  . Brain surgery  Nov. 2014    tumor resection-denies seizures  . I&d extremity Left 08/25/2015    Procedure: IRRIGATION AND DEBRIDEMENT THUMB;  Surgeon: Iran Planas, MD;  Location: [facility];  Service: Orthopedics;  Laterality: Left;    There were no vitals filed for this visit.      Subjective Assessment - 10/17/15 1111    Subjective  I can already tell a difference   Pertinent History MS   Patient Stated Goals To get my thumb better   Currently in Pain? Yes   Pain Score 2    Pain Location --  thumb   Pain Orientation Left   Pain Descriptors / Indicators Discomfort   Pain Type Surgical pain   Pain Onset More than a month ago   Pain Frequency Intermittent   Pain Relieving Factors elevation            [facility] OT Assessment - 10/17/15 0001    Hand Function   Right Hand Grip (lbs) 45 lbs   Left Hand Grip  (lbs) 30 lbs                  OT Treatments/Exercises (OP) - 10/17/15 0001    ADLs   ADL Comments Pt now > 7 weeks post-op and stiff at thumb IP and MP joints. Pt shown scar massage and instructed to perform 2x/day for 5 minute sessions   Hand Exercises   Other Hand Exercises see HEP for A/ROM, P/ROM, and putty HEP (Pt cleared for light strengthening at 7 weeks post-op). Emphasis placed on thumb IP ex's in flex and ext while blocking and reverse blocking at MP joint. Pt return demo all exercises. Pt issued red putty for home use   Splinting   Splinting Pt instructed to wean from post-surgical splint per protocol and to wear DIP extension pre-fab splint for pm wear. Pt shown how to properly apply coban wrapping at base of DIP splint. Issued DIP splint                OT Education - 10/17/15 1138    Education provided Yes   Education Details A/ROM, P/ROM, putty HEP; scar massage, nocturnal DIP splint to maintain slight hyerextension (due to lag). Also instructed to wean from other splint   Person(s)  Educated Patient   Methods Explanation;Demonstration;Handout   Comprehension Verbalized understanding;Returned demonstration          OT Short Term Goals - 10/17/15 1157    OT SHORT TERM GOAL #1   Title Pt wil lbe I protective splint use, care and precautions left hand/thumb   Baseline requires verbal and tactle cues   Time 3   Period Weeks   Status Achieved   OT SHORT TERM GOAL #2   Title Pt will be mod I home program per tendon healing extensor pollicus longus repair   Baseline Verbal and tactile cues required at eval   Time 3   Period Weeks   Status Achieved           OT Long Term Goals - 09/27/15 1218    OT LONG TERM GOAL #1   Title Pt will demonstrate scar mobility w/o limitations as seen by AROM left thumb WFL's and ability to oppose to base of 5th finger   Time 6   Period Weeks   Status New   OT LONG TERM GOAL #2   Title Pt wil lbe Mod I upgraded  HEP left hand/thumb   Time 6   Period Weeks   Status New   OT LONG TERM GOAL #3   Title Pt will be Mod I ADL's using left hand as co-assist during ADL's and self care tasks as seen by reports of pain 0-1/10.   Baseline 2/10 initially   Time 6   Period Weeks   Status New   OT LONG TERM GOAL #4   Title Pt will demonstrate functional extension of left thumb IP w/o extensor lag noted, and functional grip left thumb for prehension/daily activities.   Baseline Impaired/NT secondary to no functional use at eval.   Time 6   Period Weeks   Status New               Plan - 10/17/15 1158    Clinical Impression Statement Pt is now > 7 weeks post-op and behind protocol, therefore stiff and with slight extension lag Lt thumb IP joint. Pt met STG's today.    Rehab Potential Good   OT Treatment/Interventions Self-care/ADL training;Scar mobilization;DME and/or AE instruction;Parrafin;Patient/family education;Fluidtherapy;Splinting;Therapeutic exercises;Manual Therapy;Therapeutic exercise;Moist Heat;Therapeutic activities   Plan continue with ROM, strengthening. Assess ROM and progress towards LTG's. Assess nocturnal DIP splint. ? fluido or paraffin    Consulted and Agree with Plan of Care Patient      Patient will benefit from skilled therapeutic intervention in order to improve the following deficits and impairments:  Decreased strength, Pain, Decreased activity tolerance, Decreased mobility, Impaired UE functional use, Increased edema, Decreased range of motion, Decreased coordination, Impaired flexibility, Decreased scar mobility  Visit Diagnosis: Stiffness of left wrist, not elsewhere classified  Pain in left hand  Other specified soft tissue disorders    Problem List Patient Active Problem List   Diagnosis Date Noted  . Multiple sclerosis ([facility])   . Hypertension   . Hyperlipidemia     Carey Bullocks, OTR/L 10/17/2015, 12:04 PM  [facility] [address] [place], [place], [address] Phone: [phone]   Fax:  [phone]  Name: MILCA SYTSMA MRN: [id number] Date of Birth: 1950/08/04

## 2015-10-17 NOTE — Patient Instructions (Signed)
**   Hold the big joint of thumb down in bent position, bend then STRAIGHTEN tip joint of thumb as far as you can without straightening big joint. Focus is on straightening tip joint. Repeat 10-15 reps each, 4-6 times per day    MP Flexion (Active)   Bend thumb to touch base of little finger Repeat __10-15__ times. Do _4-6___ sessions per day.       IP Flexion (Active Blocked)   Brace thumb below tip joint. Bend joint as far as possible. Repeat __10__ times. Do _4-6___ sessions per day.      Composite Flexion (Passive)   Use other hand to bend both joints of thumb at the same time. Hold _10___ seconds. Repeat __5__ times. Do _4-6___ sessions per day.   Composite Extension (Passive)   Using other hand, straighten thumb completely at both joints (AWAY from palm). Hold __10__ seconds. Repeat __5__ times. Do _4-6___ sessions per day.    1. Grip Strengthening (Resistive Putty)   Squeeze putty using thumb and all fingers. Repeat _20___ times. Do __2__ sessions per day.   2. Roll putty into tube on table and pinch between index finger and thumb, then long finger and thumb x 10 reps each.  Do 2x/day   3. Pinch: Lateral    Squeeze putty between right thumb and side of each finger in turn. Make small balls and squash into coin shapes. Repeat _10___ times. Do __2__ sessions per day.   4. Extension (Resistive Putty)    Straighten thumb inside putty loop anchored by fingers. Or can do with rubberband Repeat __10__ times. Do _2___ sessions per day.    **Scar massage 2x/day for 5 minute sessions along incision applying deep pressure in circular motions, using cocoa butter or Vitamin E cream

## 2015-10-24 ENCOUNTER — Encounter: Payer: Self-pay | Admitting: Occupational Therapy

## 2015-10-24 ENCOUNTER — Ambulatory Visit: Payer: Medicaid Other | Admitting: Occupational Therapy

## 2015-10-24 DIAGNOSIS — M25632 Stiffness of left wrist, not elsewhere classified: Secondary | ICD-10-CM

## 2015-10-24 DIAGNOSIS — M79642 Pain in left hand: Secondary | ICD-10-CM

## 2015-10-24 DIAGNOSIS — M7989 Other specified soft tissue disorders: Secondary | ICD-10-CM

## 2015-10-24 NOTE — Patient Instructions (Signed)
Scar Tissue Massage    Place pad of fingertip on scar area left thumb. Apply steady downward pressure while moving in circular or back and forth fashion. Use another fin-ger on top to assist. Repeat until entire scar has been covered. Repeat _for_10__ minutes. Do __1-2__ sessions per day.   Reviewed splinting use, care and precautions - wear IP extension splint at night left thumb. Call prior to next appointment if extensor lag increases.  Reviewed home exercises program for A/ROM, P/ROM blocked and reverse blocking IP/MP flexion/extension of thumb, opposition, grip and pinches with putty as previously issued.

## 2015-10-24 NOTE — Therapy (Signed)
[facility] [address] [place], [place], [address] Phone: [phone]   Fax:  [phone]  Occupational Therapy Treatment  Patient Details  Name: Leslie Smith MRN: [id number] Date of Birth: 1951-04-03 No Data Recorded  Encounter Date: 10/24/2015      OT End of Session - 10/24/15 1035    Visit Number 3   Authorization Type MCD    Authorization Time Period Approved 3 visits from 10/03/15 - 11/27/15   Authorization - Visit Number 2   Authorization - Number of Visits 3   OT Start Time 0930   OT Stop Time 1012   OT Time Calculation (min) 42 min   Activity Tolerance Patient tolerated treatment well   Behavior During Therapy [facility] for tasks assessed/performed      Past Medical History  Diagnosis Date  . Multiple sclerosis ([place] [facility]) 12/08/2002  . Hypertension 02/06/2012  . Hyperlipidemia 02/06/2012  . Complication of anesthesia   . PONV (postoperative nausea and vomiting)   . 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[facility]) 09/15/2014    Past Surgical History  Procedure Laterality Date  . Abdominal hysterectomy    . Breast surgery Left     biopsy  . Brain surgery  Nov. 2014    tumor resection-denies seizures  . I&d extremity Left 08/25/2015    Procedure: IRRIGATION AND DEBRIDEMENT THUMB;  Surgeon: Iran Planas, MD;  Location: [facility];  Service: Orthopedics;  Laterality: Left;    There were no vitals filed for this visit.      Subjective Assessment - 10/24/15 1023    Subjective  Pt reports performing HEP 3x/day. Pt rates her pain as 2-3/10 and reports that she can "feel a knot" in thumb scar area. She is currently 8 weeks and 4 days post-op EPL repair left non-dominant hand.            [facility] OT Assessment - 10/24/15 0001    ROM / Strength   AROM / PROM / Strength AROM   AROM   AROM Assessment Site Thumb;Finger   Right/Left Finger Right   Right/Left Thumb Left;Right   Right Thumb Opposition Digit 5  Opposition to PIPJ of small  finger   Left Thumb Opposition Digit 5  Base of small finger   Right Hand AROM   R Thumb MCP 0-60 55 Degrees  0-55*   R Thumb IP 0-80 54 Degrees  Hyperextension - 54*   Left Hand AROM   L Thumb MCP 0-60 55 Degrees  0-55*   L Thumb IP 0-80 49 Degrees  -4-49* left thumb                  OT Treatments/Exercises (OP) - 10/24/15 0001    Hand Exercises   Other Hand Exercises see HEP for A/ROM, P/ROM, and putty HEP (Pt cleared for light strengthening at 7 weeks post-op, she is currently 8 weeks and 4 days). Emphasis placed on thumb IP ex's in flex and ext while blocking and reverse blocking at MP joint. Pt return demo of all exercises x10 reps each. Performed red putty ex's for home use (min verbal cues for positioning and follow through for ex's)   Manual Therapy   Manual Therapy Soft tissue mobilization;Other (comment)  Gentle scar mobilization x10 min - pt ed as well   Soft tissue mobilization see above - left dorsal thumb/scar          Place pad of fingertip on scar area left thumb. Apply steady downward pressure  while moving in circular or back and forth fashion. Use another fin-ger on top to assist. Repeat until entire scar has been covered. Repeat _for_10__ minutes. Do __1-2__ sessions per day.   Reviewed splinting use, care and precautions - wear IP extension splint at night left thumb. Call prior to next appointment if extensor lag increases.  Reviewed home exercises program for A/ROM, P/ROM blocked and reverse blocking IP/MP flexion/extension of thumb, opposition, grip and pinches with putty as previously issued.          OT Education - 10/24/15 1034    Education provided Yes   Education Details A/ROM, P/ROM, putty HEP, scar management, Noc use DIP extension splint left thumb to maintain slight hyperextension (due to -4* lag).    Person(s) Educated Patient   Methods Explanation;Demonstration;Verbal cues   Comprehension Verbalized understanding;Returned  demonstration          OT Short Term Goals - 10/17/15 1157    OT SHORT TERM GOAL #1   Title Pt wil lbe I protective splint use, care and precautions left hand/thumb   Baseline requires verbal and tactle cues   Time 3   Period Weeks   Status Achieved   OT SHORT TERM GOAL #2   Title Pt will be mod I home program per tendon healing extensor pollicus longus repair   Baseline Verbal and tactile cues required at eval   Time 3   Period Weeks   Status Achieved           OT Long Term Goals - 10/24/15 1040    OT LONG TERM GOAL #1   Title Pt will demonstrate scar mobility w/o limitations as seen by AROM left thumb WFL's and ability to oppose to base of 5th finger   Baseline On 10/24/15, pt able to oppose to volar PIP of small finger left   Time 6   Period Weeks   Status On-going  Progressing    OT LONG TERM GOAL #2   Title Pt wil lbe Mod I upgraded HEP left hand/thumb   Baseline 10/24/15 min vc's for positioning and holding of each exercise for 3-5 second count for maximum tendon excursion   Time 6   Period Weeks   Status On-going  Progressing                Plan - 10/24/15 1036    Clinical Impression Statement Pt is now 8 weeks and 4 days post-op EPL repair. She remains stiff in flexion/extension left thumb w/ slight extensor lag at IP joint (-4-49*). Progressing toward LTG's and will benefit from HEP to assist with increasing AROM, scar mobilization and overal lfunctional use left thumb.   Rehab Potential Good   OT Treatment/Interventions Self-care/ADL training;Scar mobilization;DME and/or AE instruction;Parrafin;Patient/family education;Fluidtherapy;Splinting;Therapeutic exercises;Manual Therapy;Therapeutic exercise;Moist Heat;Therapeutic activities   Plan Assess AROM, Grip/pinch and LTG's - d/c next visit to independent HEP as able.   Consulted and Agree with Plan of Care Patient      Patient will benefit from skilled therapeutic intervention in order to improve the  following deficits and impairments:  Decreased strength, Pain, Decreased activity tolerance, Decreased mobility, Impaired UE functional use, Increased edema, Decreased range of motion, Decreased coordination, Impaired flexibility, Decreased scar mobility  Visit Diagnosis: Stiffness of left wrist, not elsewhere classified  Pain in left hand  Other specified soft tissue disorders    Problem List Patient Active Problem List   Diagnosis Date Noted  . Multiple sclerosis ([place])   . Hypertension   .  Hyperlipidemia     Farin Buhman Ardath Sax , OTR/L  10/24/2015, 10:44 AM  [facility] [address] [place], [place], [address] Phone: [phone]   Fax:  [phone]  Name: LERISSA MCCLAREN MRN: [id number] Date of Birth: 1950-09-04

## 2015-10-26 ENCOUNTER — Encounter: Payer: Self-pay | Admitting: Family Medicine

## 2015-10-26 ENCOUNTER — Ambulatory Visit (INDEPENDENT_AMBULATORY_CARE_PROVIDER_SITE_OTHER): Payer: Medicaid Other | Admitting: Family Medicine

## 2015-10-26 VITALS — BP 138/76 | HR 70 | Temp 98.0°F | Ht 66.5 in | Wt 303.2 lb

## 2015-10-26 DIAGNOSIS — G35 Multiple sclerosis: Secondary | ICD-10-CM | POA: Diagnosis not present

## 2015-10-26 DIAGNOSIS — I10 Essential (primary) hypertension: Secondary | ICD-10-CM

## 2015-10-26 DIAGNOSIS — E785 Hyperlipidemia, unspecified: Secondary | ICD-10-CM

## 2015-10-26 DIAGNOSIS — Z0001 Encounter for general adult medical examination with abnormal findings: Secondary | ICD-10-CM | POA: Diagnosis not present

## 2015-10-26 DIAGNOSIS — C569 Malignant neoplasm of unspecified ovary: Secondary | ICD-10-CM | POA: Insufficient documentation

## 2015-10-26 DIAGNOSIS — Z0000 Encounter for general adult medical examination without abnormal findings: Secondary | ICD-10-CM

## 2015-10-26 LAB — LP+ALT+AST PICCOLO, WAIVED
ALT (SGPT) Piccolo, Waived: 16 U/L (ref 10–47)
AST (SGOT) Piccolo, Waived: 24 U/L (ref 11–38)
CHOL/HDL RATIO PICCOLO,WAIVE: 3.4 mg/dL
Cholesterol Piccolo, Waived: 226 mg/dL — ABNORMAL HIGH (ref <200)
HDL Chol Piccolo, Waived: 67 mg/dL (ref >59)
LDL CHOL CALC PICCOLO WAIVED: 132 mg/dL — AB (ref <100)
Triglycerides Piccolo,Waived: 138 mg/dL (ref <150)
VLDL CHOL CALC PICCOLO,WAIVE: 28 mg/dL (ref <30)

## 2015-10-26 LAB — URINALYSIS, ROUTINE W REFLEX MICROSCOPIC
BILIRUBIN UA: NEGATIVE
GLUCOSE, UA: NEGATIVE
KETONES UA: NEGATIVE
LEUKOCYTES UA: NEGATIVE
Nitrite, UA: NEGATIVE
PH UA: 5.5 (ref 5.0–7.5)
Urobilinogen, Ur: 0.2 mg/dL (ref 0.2–1.0)

## 2015-10-26 LAB — MICROSCOPIC EXAMINATION

## 2015-10-26 MED ORDER — LOSARTAN POTASSIUM 100 MG PO TABS
100.0000 mg | ORAL_TABLET | Freq: Every day | ORAL | Status: DC
Start: 2015-10-26 — End: 2016-12-02

## 2015-10-26 MED ORDER — IBUPROFEN 800 MG PO TABS: 800.0000 mg | ORAL_TABLET | Freq: Three times a day (TID) | ORAL | Status: DC | PRN

## 2015-10-26 NOTE — Assessment & Plan Note (Signed)
Good reports on MS patient not taking shots anymore as her doctor was able to stop those. Stops because of concern about liver count

## 2015-10-26 NOTE — Assessment & Plan Note (Signed)
The current medical regimen is effective;  continue present plan and medications.  

## 2015-10-26 NOTE — Progress Notes (Signed)
   BP 138/76 mmHg  Pulse 70  Temp(Src) 98 F (36.7 C)  Ht 5' 6.5" (1.689 m)  Wt 303 lb 3.2 oz (137.531 kg)  BMI 48.21 kg/m2  SpO2 95%   Subjective:    Patient ID: Leslie Smith, female    DOB: 09-24-1950, 65 y.o.   MRN: [id number]  HPI: Leslie Smith is a 65 y.o. female  Chief Complaint  Patient presents with  . Annual Exam   Patient's primary concern is difficulty with weight loss does eat very sensibly but still having great deal of problems has trouble working and exercise but all in all trying to eat better. Blood pressures been doing well no complaints from losartan. Taking muscle relaxer only rarely all in all's doing okay. MS  stable doing well Relevant past medical, surgical, family and social history reviewed and updated as indicated. Interim medical history since our last visit reviewed. Allergies and medications reviewed and updated.  Review of Systems  Constitutional: Negative.   HENT: Negative.   Eyes: Negative.   Respiratory: Negative.   Cardiovascular: Negative.   Gastrointestinal: Negative.   Endocrine: Negative.   Genitourinary: Negative.   Musculoskeletal: Negative.   Skin: Negative.   Allergic/Immunologic: Negative.   Neurological: Negative.   Hematological: Negative.   Psychiatric/Behavioral: Negative.     Per HPI unless specifically indicated above     Objective:    BP 138/76 mmHg  Pulse 70  Temp(Src) 98 F (36.7 C)  Ht 5' 6.5" (1.689 m)  Wt 303 lb 3.2 oz (137.531 kg)  BMI 48.21 kg/m2  SpO2 95%  Wt Readings from Last 3 Encounters:  10/26/15 303 lb 3.2 oz (137.531 kg)  08/16/15 303 lb (137.44 kg)  09/15/14 299 lb (135.626 kg)    Physical Exam      Assessment & Plan:   Problem List Items Addressed This Visit      Cardiovascular and Mediastinum   Essential hypertension    The current medical regimen is effective;  continue present plan and medications.       Relevant Medications   losartan (COZAAR) 100 MG tablet     Nervous and Auditory   Multiple sclerosis (Leslie Smith)    Good reports on MS patient not taking shots anymore as her doctor was able to stop those. Stops because of concern about liver count       Relevant Orders   TSH     Genitourinary   Ovarian cancer (Leslie Smith)   Relevant Medications   ibuprofen (ADVIL,MOTRIN) 800 MG tablet     Other   Hyperlipidemia    The current medical regimen is effective;  continue present plan and medications.       Relevant Medications   losartan (COZAAR) 100 MG tablet    Other Visit Diagnoses    Routine general medical examination at a health care facility    -  Primary    Relevant Orders    LP+ALT+AST Piccolo, Waived    CBC with Differential/Platelet    Comprehensive metabolic panel    Urinalysis, Routine w reflex microscopic (not at [facility])    Hepatitis C Antibody    TSH        Follow up plan: Return in about 6 months (around 04/27/2016) for bmp.

## 2015-10-27 LAB — CBC WITH DIFFERENTIAL/PLATELET
BASOS ABS: 0.1 10*3/uL (ref 0.0–0.2)
Basos: 1 %
EOS (ABSOLUTE): 0.2 10*3/uL (ref 0.0–0.4)
Eos: 2 %
HEMATOCRIT: 37.7 % (ref 34.0–46.6)
Hemoglobin: 12.5 g/dL (ref 11.1–15.9)
IMMATURE GRANULOCYTES: 0 %
Immature Grans (Abs): 0 10*3/uL (ref 0.0–0.1)
Lymphocytes Absolute: 1.7 10*3/uL (ref 0.7–3.1)
Lymphs: 19 %
MCH: 28.6 pg (ref 26.6–33.0)
MCHC: 33.2 g/dL (ref 31.5–35.7)
MCV: 86 fL (ref 79–97)
MONOCYTES: 6 %
MONOS ABS: 0.5 10*3/uL (ref 0.1–0.9)
Neutrophils Absolute: 6.4 10*3/uL (ref 1.4–7.0)
Neutrophils: 72 %
Platelets: 30 10*3/uL — CL (ref 150–379)
RBC: 4.37 x10E6/uL (ref 3.77–5.28)
RDW: 14.7 % (ref 12.3–15.4)
WBC: 8.8 10*3/uL (ref 3.4–10.8)

## 2015-10-27 LAB — COMPREHENSIVE METABOLIC PANEL
ALT: 19 IU/L (ref 0–32)
AST: 17 IU/L (ref 0–40)
Albumin/Globulin Ratio: 1.5 (ref 1.2–2.2)
Albumin: 4 g/dL (ref 3.6–4.8)
Alkaline Phosphatase: 73 IU/L (ref 39–117)
BUN/Creatinine Ratio: 20 (ref 12–28)
BUN: 14 mg/dL (ref 8–27)
Bilirubin Total: 0.4 mg/dL (ref 0.0–1.2)
CALCIUM: 9.3 mg/dL (ref 8.7–10.3)
CO2: 24 mmol/L (ref 18–29)
CREATININE: 0.69 mg/dL (ref 0.57–1.00)
Chloride: 101 mmol/L (ref 96–106)
GFR calc Af Amer: 106 mL/min/{1.73_m2} (ref >59)
GFR, EST NON AFRICAN AMERICAN: 92 mL/min/{1.73_m2} (ref >59)
GLOBULIN, TOTAL: 2.7 g/dL (ref 1.5–4.5)
GLUCOSE: 112 mg/dL — AB (ref 65–99)
Potassium: 4.2 mmol/L (ref 3.5–5.2)
SODIUM: 141 mmol/L (ref 134–144)
Total Protein: 6.7 g/dL (ref 6.0–8.5)

## 2015-10-27 LAB — HEPATITIS C ANTIBODY

## 2015-10-27 LAB — TSH: TSH: 2.2 u[IU]/mL (ref 0.450–4.500)

## 2015-10-30 ENCOUNTER — Encounter: Payer: Medicaid Other | Admitting: *Deleted

## 2015-10-30 ENCOUNTER — Encounter: Payer: Self-pay | Admitting: Family Medicine

## 2015-10-30 ENCOUNTER — Telehealth: Payer: Self-pay | Admitting: Family Medicine

## 2015-10-30 DIAGNOSIS — D696 Thrombocytopenia, unspecified: Secondary | ICD-10-CM | POA: Insufficient documentation

## 2015-10-30 NOTE — Telephone Encounter (Signed)
Phone call Discussed with patient thrombocytopenia stable will continue current care.

## 2015-11-08 ENCOUNTER — Telehealth: Payer: Self-pay | Admitting: Family Medicine

## 2015-11-08 NOTE — Telephone Encounter (Signed)
Pt called requests a call back from [place]. No further information provided. Thanks.

## 2015-11-14 ENCOUNTER — Ambulatory Visit: Payer: Medicaid Other | Attending: Orthopedic Surgery | Admitting: Occupational Therapy

## 2015-11-14 ENCOUNTER — Encounter: Payer: Self-pay | Admitting: Occupational Therapy

## 2015-11-14 DIAGNOSIS — M79642 Pain in left hand: Secondary | ICD-10-CM | POA: Diagnosis present

## 2015-11-14 DIAGNOSIS — M25632 Stiffness of left wrist, not elsewhere classified: Secondary | ICD-10-CM | POA: Diagnosis not present

## 2015-11-14 NOTE — Therapy (Signed)
[facility] [address] [place], [place], [address] Phone: [phone]   Fax:  [phone]  Occupational Therapy Treatment  Patient Details  Name: Leslie Smith MRN: [id number] Date of Birth: 03-28-1951 No Data Recorded  Encounter Date: 11/14/2015      OT End of Session - 11/14/15 1012    Visit Number 4   Date for OT Re-Evaluation 10/27/15   Authorization Type MCD    Authorization Time Period Approved 3 visits from 10/03/15 - 11/27/15   Authorization - Visit Number 3   Authorization - Number of Visits 3   OT Start Time 0930   OT Stop Time 9242   OT Time Calculation (min) 45 min   Activity Tolerance Patient tolerated treatment well      Past Medical History  Diagnosis Date  . Complication of anesthesia   . PONV (postoperative nausea and vomiting)   . 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 Abdominal hysterectomy    . Breast surgery Left     biopsy  . Brain surgery  Nov. 2014    tumor resection-denies seizures  . I&d extremity Left 08/25/2015    Procedure: IRRIGATION AND DEBRIDEMENT THUMB;  Surgeon: Iran Planas, MD;  Location: [facility];  Service: Orthopedics;  Laterality: Left;    There were no vitals filed for this visit.      Subjective Assessment - 11/14/15 0937    Pertinent History MS   Patient Stated Goals To get my thumb better   Currently in Pain? Yes   Pain Score 2    Pain Location --  thumb   Pain Orientation Left   Pain Descriptors / Indicators Discomfort   Pain Type Surgical pain   Pain Onset More than a month ago   Pain Frequency Intermittent   Aggravating Factors  extreme motion   Pain Relieving Factors elevation, heat            [facility] OT Assessment - 11/14/15 0001    Coordination   9 Hole Peg Test Right;Left   Right 9 Hole Peg Test 23.97 sec   Left 9 Hole Peg Test 28.03 sec   Left Hand AROM   L Thumb MCP 0-60 60 Degrees   L Thumb IP 0-80 50 Degrees  -8   L Thumb Radial ADduction/ABduction 0-55 85   L Thumb Palmar ADduction/ABduction 0-45 90   Hand Function   Right Hand Grip (lbs) 70 lbs   Right Hand Lateral Pinch 16 lbs   Right Hand 3 Point Pinch 14 lbs   Left Hand Grip (lbs) 55 lbs   Left Hand Lateral Pinch 14 lbs   Left 3 point pinch 12 lbs                  OT Treatments/Exercises (OP) - 11/14/15 0001    ADLs   ADL Comments Assessed ROM, grip strength, and progress towards LTG's.    Hand Exercises   Other Hand Exercises Reviewed putty HEP and updated to green resistance putty (issued green putty today). Pt performed each x 10 reps   Other Hand Exercises Gripper set at 55 lbs resistance to pick up blocks Lt hand for sustained grip strength. Pt able to perform with mod drops                OT Education - 11/14/15 1012    Education provided Yes   Education Details review of  putty HEP    Person(s) Educated Patient   Methods Explanation;Demonstration   Comprehension Verbalized understanding;Returned demonstration          OT Short Term Goals - 10/17/15 1157    OT SHORT TERM GOAL #1   Title Pt wil lbe I protective splint use, care and precautions left hand/thumb   Baseline requires verbal and tactle cues   Time 3   Period Weeks   Status Achieved   OT SHORT TERM GOAL #2   Title Pt will be mod I home program per tendon healing extensor pollicus longus repair   Baseline Verbal and tactile cues required at eval   Time 3   Period Weeks   Status Achieved           OT Long Term Goals - 11/14/15 0947    OT LONG TERM GOAL #1   Title Pt will demonstrate scar mobility w/o limitations as seen by AROM left thumb WFL's and ability to oppose to base of 5th finger   Time 6   Period Weeks   Status Achieved   OT LONG TERM GOAL #2   Title Pt wil lbe Mod I upgraded HEP left hand/thumb   Time 6   Period Weeks   Status Achieved   OT LONG TERM GOAL #3   Title Pt will be Mod I  ADL's using left hand as co-assist during ADL's and self care tasks as seen by reports of pain 0-1/10.   Baseline 2/10 initially   Time 6   Period Weeks   Status Achieved   OT LONG TERM GOAL #4   Title Pt will demonstrate functional extension of left thumb IP w/o extensor lag noted, and functional grip left thumb for prehension/daily activities.   Baseline Impaired/NT secondary to no functional use at eval.   Time 6   Period Weeks   Status Partially Met  -5 to -10 degree IP extensor lag, but functional for all tasks. Pt also with 55 lbs grip strength Lt hand               Plan - 11/14/15 1013    Clinical Impression Statement Pt met all STG's and LTG's with mild extensor lag IP joint of Lt thumb. Pt has functional grip strength and coordination for all functional tasks   Plan D/C O.T.    Consulted and Agree with Plan of Care Patient      Patient will benefit from skilled therapeutic intervention in order to improve the following deficits and impairments:     Visit Diagnosis: Stiffness of left wrist, not elsewhere classified  Pain in left hand    Problem List Patient Active Problem List   Diagnosis Date Noted  . Thrombocytopenia ([place]) 10/30/2015  . Ovarian cancer ([place]) 10/26/2015  . Multiple sclerosis ([place])   . Essential hypertension   . Hyperlipidemia    OCCUPATIONAL THERAPY DISCHARGE SUMMARY  Visits from Start of Care: 4  Current functional level related to goals / functional outcomes: SEE ABOVE   Remaining deficits: Strength IP extensor lag Lt thumb   Education / Equipment: Splint wear and care, HEP's, scar massage  Plan: Patient agrees to discharge.  Patient goals were met. Patient is being discharged due to meeting the stated rehab goals.  ?????       Carey Bullocks, OTR/L 11/14/2015, 10:28 AM  [facility] [address] [place], [place], [address] Phone: [phone]   Fax:   [phone]  Name:  Leslie Smith MRN: [id number] Date of Birth: 08-Nov-1950

## 2015-12-19 DIAGNOSIS — G35 Multiple sclerosis: Secondary | ICD-10-CM | POA: Diagnosis not present

## 2016-05-07 ENCOUNTER — Ambulatory Visit: Payer: Medicaid Other | Admitting: Family Medicine

## 2016-05-22 DIAGNOSIS — G35 Multiple sclerosis: Secondary | ICD-10-CM | POA: Diagnosis not present

## 2016-06-10 DIAGNOSIS — M1710 Unilateral primary osteoarthritis, unspecified knee: Secondary | ICD-10-CM

## 2016-06-10 DIAGNOSIS — M179 Osteoarthritis of knee, unspecified: Secondary | ICD-10-CM

## 2016-06-10 HISTORY — DX: Unilateral primary osteoarthritis, unspecified knee: M17.10

## 2016-06-10 HISTORY — DX: Osteoarthritis of knee, unspecified: M17.9

## 2016-10-14 DIAGNOSIS — G379 Demyelinating disease of central nervous system, unspecified: Secondary | ICD-10-CM | POA: Diagnosis not present

## 2016-10-21 ENCOUNTER — Other Ambulatory Visit: Payer: Self-pay | Admitting: Family Medicine

## 2016-10-21 NOTE — Telephone Encounter (Signed)
  Last routine OV: 10/26/15 Next OV: None on file.   Pt no showed OV on 05/07/16

## 2016-12-02 ENCOUNTER — Telehealth: Payer: Self-pay | Admitting: Family Medicine

## 2016-12-02 ENCOUNTER — Other Ambulatory Visit: Payer: Self-pay | Admitting: Family Medicine

## 2016-12-02 NOTE — Telephone Encounter (Signed)
Last OV: 10/26/15 Next OV: None on file.   Lab Results  Component Value Date   CHOL 226 (H) 10/26/2015   TRIG 138 10/26/2015   Lab Results  Component Value Date   CREATININE 0.69 10/26/2015   BUN 14 10/26/2015   NA 141 10/26/2015   K 4.2 10/26/2015   CL 101 10/26/2015   CO2 24 10/26/2015

## 2016-12-02 NOTE — Telephone Encounter (Signed)
Patient states she received a call from someone at this office but no name or message was left.  I told her I would see if you or Dr Jeananne Rama tried to reach her for any reason.  Please advise.  Thanks

## 2016-12-02 NOTE — Telephone Encounter (Signed)
Pt needs to schedule OV.

## 2016-12-02 NOTE — Telephone Encounter (Signed)
Left message on machine for pt to return call to the office.  

## 2016-12-02 NOTE — Telephone Encounter (Signed)
apt 

## 2016-12-04 NOTE — Telephone Encounter (Signed)
Tried to reach patient to schedule follow up appt but could not reach and her VM was full so I could not leave a message.  If she calls she needs an appointment.

## 2017-01-01 ENCOUNTER — Encounter: Payer: Medicaid Other | Admitting: Family Medicine

## 2017-01-01 ENCOUNTER — Telehealth: Payer: Self-pay | Admitting: Family Medicine

## 2017-01-01 NOTE — Telephone Encounter (Signed)
Patient called because she was running late for her 900 am appt she was going to be later than the 10 min policy rescheduled the appt for tomorrow 900 am.  She apologized.  Thanks

## 2017-01-02 ENCOUNTER — Ambulatory Visit (INDEPENDENT_AMBULATORY_CARE_PROVIDER_SITE_OTHER): Payer: Medicare Other | Admitting: Family Medicine

## 2017-01-02 ENCOUNTER — Encounter: Payer: Self-pay | Admitting: Family Medicine

## 2017-01-02 VITALS — BP 147/71 | HR 70 | Ht 66.0 in | Wt 315.0 lb

## 2017-01-02 DIAGNOSIS — M170 Bilateral primary osteoarthritis of knee: Secondary | ICD-10-CM

## 2017-01-02 DIAGNOSIS — E785 Hyperlipidemia, unspecified: Secondary | ICD-10-CM | POA: Diagnosis not present

## 2017-01-02 DIAGNOSIS — Z131 Encounter for screening for diabetes mellitus: Secondary | ICD-10-CM

## 2017-01-02 DIAGNOSIS — Z7189 Other specified counseling: Secondary | ICD-10-CM | POA: Insufficient documentation

## 2017-01-02 DIAGNOSIS — G35 Multiple sclerosis: Secondary | ICD-10-CM | POA: Diagnosis not present

## 2017-01-02 DIAGNOSIS — I10 Essential (primary) hypertension: Secondary | ICD-10-CM | POA: Diagnosis not present

## 2017-01-02 DIAGNOSIS — Z1239 Encounter for other screening for malignant neoplasm of breast: Secondary | ICD-10-CM

## 2017-01-02 DIAGNOSIS — Z1329 Encounter for screening for other suspected endocrine disorder: Secondary | ICD-10-CM | POA: Diagnosis not present

## 2017-01-02 DIAGNOSIS — M1710 Unilateral primary osteoarthritis, unspecified knee: Secondary | ICD-10-CM | POA: Insufficient documentation

## 2017-01-02 DIAGNOSIS — Z0000 Encounter for general adult medical examination without abnormal findings: Secondary | ICD-10-CM

## 2017-01-02 DIAGNOSIS — M179 Osteoarthritis of knee, unspecified: Secondary | ICD-10-CM | POA: Insufficient documentation

## 2017-01-02 MED ORDER — LOSARTAN POTASSIUM-HCTZ 100-25 MG PO TABS: 1.0000 | ORAL_TABLET | Freq: Every day | ORAL | 2 refills | Status: DC

## 2017-01-02 MED ORDER — TIZANIDINE HCL 4 MG PO CAPS: 4.0000 mg | ORAL_CAPSULE | Freq: Three times a day (TID) | ORAL | 2 refills | Status: DC | PRN

## 2017-01-02 NOTE — Assessment & Plan Note (Signed)
MS appears stable will leave alone for now

## 2017-01-02 NOTE — Progress Notes (Signed)
BP (!) 147/71   Pulse 70   Ht 5\' 6"  (1.676 m)   Wt (!) 315 lb (142.9 kg)   SpO2 98%   BMI 50.84 kg/m    Subjective:    Patient ID: Leslie Smith, female    DOB: 02-Nov-1950, 66 y.o.   MRN: [id number]  HPI: Leslie Smith is a 66 y.o. female  Chief Complaint  Patient presents with  . Knee Pain    Bilateral. Right is worse.   . Annual Exam  . Skin check  Patient with bilateral knee pain right worse unless will occasionally just be bothersome with some leg edema especially over the last several days which is now resolved. But may need problem is can be standing not doing anything and pain will hit her right knee to the point where she almost has to go down and can't stand on it anymore. That type episodes not associated with clicking or locking but does have occasional clicking or locking of her knee with difficult range of motion. MS is stable was able to stop the shots she was getting an associated medicine a year ago and has continued to do okay. Continues blood pressure medication losartan without problems. Having some sleep disorder and has used Zanaflex previously which helped with sleep and some muscle aches wants to have a refill on that. Has intermittent intertrigo but otherwise doing okay No GYN visits ovarian cancer is a non issue   Relevant past medical, surgical, family and social history reviewed and updated as indicated. Interim medical history since our last visit reviewed. Allergies and medications reviewed and updated.  Other than above Review of Systems  Constitutional: Negative.   HENT: Negative.   Eyes: Negative.   Respiratory: Negative.   Cardiovascular: Negative.   Gastrointestinal: Negative.   Endocrine: Negative.   Genitourinary: Negative.   Musculoskeletal: Negative.   Skin: Negative.   Allergic/Immunologic: Negative.   Neurological: Negative.   Hematological: Negative.   Psychiatric/Behavioral: Negative.     Per HPI unless specifically  indicated above     Objective:    BP (!) 147/71   Pulse 70   Ht 5\' 6"  (1.676 m)   Wt (!) 315 lb (142.9 kg)   SpO2 98%   BMI 50.84 kg/m   Wt Readings from Last 3 Encounters:  01/02/17 (!) 315 lb (142.9 kg)  10/26/15 (!) 303 lb 3.2 oz (137.5 kg)  08/16/15 (!) 303 lb (137.4 kg)    Physical Exam  Constitutional: She is oriented to person, place, and time. She appears well-developed and well-nourished.  HENT:  Head: Normocephalic and atraumatic.  Right Ear: External ear normal.  Left Ear: External ear normal.  Nose: Nose normal.  Mouth/Throat: Oropharynx is clear and moist.  Eyes: Pupils are equal, round, and reactive to light. Conjunctivae and EOM are normal.  Neck: Normal range of motion. Neck supple. Carotid bruit is not present.  Cardiovascular: Normal rate, regular rhythm and normal heart sounds.   No murmur heard. Pulmonary/Chest: Effort normal and breath sounds normal. She exhibits no mass. Right breast exhibits no mass, no skin change and no tenderness. Left breast exhibits no mass, no skin change and no tenderness. Breasts are symmetrical.  Abdominal: Soft. Bowel sounds are normal. There is no hepatosplenomegaly.  Musculoskeletal: Normal range of motion.  DJD changes knees  Neurological: She is alert and oriented to person, place, and time.  MS "Stuff"  Skin: No rash noted.  Psychiatric: She has a normal mood and  affect. Her behavior is normal. Judgment and thought content normal.    Results for orders placed or performed in visit on 10/26/15  Microscopic Examination  Result Value Ref Range   WBC, UA 0-5 0 - 5 /hpf   RBC, UA 3-10 (A) 0 - 2 /hpf   Epithelial Cells (non renal) 0-10 0 - 10 /hpf   Mucus, UA Present Not Estab.   Bacteria, UA Few None seen/Few  LP+ALT+AST Piccolo, Waived  Result Value Ref Range   ALT (SGPT) Piccolo, Waived 16 10 - 47 U/L   AST (SGOT) Piccolo, Waived 24 11 - 38 U/L   Cholesterol Piccolo, Waived 226 (H) <200 mg/dL   HDL Chol Piccolo,  Waived 67 >59 mg/dL   Triglycerides Piccolo,Waived 138 <150 mg/dL   Chol/HDL Ratio Piccolo,Waive 3.4 mg/dL   LDL Chol Calc Piccolo Waived 132 (H) <100 mg/dL   VLDL Chol Calc Piccolo,Waive 28 <30 mg/dL  CBC with Differential/Platelet  Result Value Ref Range   WBC 8.8 3.4 - 10.8 x10E3/uL   RBC 4.37 3.77 - 5.28 x10E6/uL   Hemoglobin 12.5 11.1 - 15.9 g/dL   Hematocrit 37.7 34.0 - 46.6 %   MCV 86 79 - 97 fL   MCH 28.6 26.6 - 33.0 pg   MCHC 33.2 31.5 - 35.7 g/dL   RDW 14.7 12.3 - 15.4 %   Platelets 30 (LL) 150 - 379 x10E3/uL   Neutrophils 72 %   Lymphs 19 %   Monocytes 6 %   Eos 2 %   Basos 1 %   Neutrophils Absolute 6.4 1.4 - 7.0 x10E3/uL   Lymphocytes Absolute 1.7 0.7 - 3.1 x10E3/uL   Monocytes Absolute 0.5 0.1 - 0.9 x10E3/uL   EOS (ABSOLUTE) 0.2 0.0 - 0.4 x10E3/uL   Basophils Absolute 0.1 0.0 - 0.2 x10E3/uL   Immature Granulocytes 0 %   Immature Grans (Abs) 0.0 0.0 - 0.1 x10E3/uL  Comprehensive metabolic panel  Result Value Ref Range   Glucose 112 (H) 65 - 99 mg/dL   BUN 14 8 - 27 mg/dL   Creatinine, Ser 0.69 0.57 - 1.00 mg/dL   GFR calc non Af Amer 92 >59 mL/min/1.73   GFR calc Af Amer 106 >59 mL/min/1.73   BUN/Creatinine Ratio 20 12 - 28   Sodium 141 134 - 144 mmol/L   Potassium 4.2 3.5 - 5.2 mmol/L   Chloride 101 96 - 106 mmol/L   CO2 24 18 - 29 mmol/L   Calcium 9.3 8.7 - 10.3 mg/dL   Total Protein 6.7 6.0 - 8.5 g/dL   Albumin 4.0 3.6 - 4.8 g/dL   Globulin, Total 2.7 1.5 - 4.5 g/dL   Albumin/Globulin Ratio 1.5 1.2 - 2.2   Bilirubin Total 0.4 0.0 - 1.2 mg/dL   Alkaline Phosphatase 73 39 - 117 IU/L   AST 17 0 - 40 IU/L   ALT 19 0 - 32 IU/L  Urinalysis, Routine w reflex microscopic (not at [facility])  Result Value Ref Range   Specific Gravity, UA >1.030 (H) 1.005 - 1.030   pH, UA 5.5 5.0 - 7.5   Color, UA Yellow Yellow   Appearance Ur Clear Clear   Leukocytes, UA Negative Negative   Protein, UA 2+ (A) Negative/Trace   Glucose, UA Negative Negative   Ketones, UA  Negative Negative   RBC, UA 2+ (A) Negative   Bilirubin, UA Negative Negative   Urobilinogen, Ur 0.2 0.2 - 1.0 mg/dL   Nitrite, UA Negative Negative   Microscopic Examination See  below:   Hepatitis C Antibody  Result Value Ref Range   Hep C Virus Ab <0.1 0.0 - 0.9 s/co ratio  TSH  Result Value Ref Range   TSH 2.200 0.450 - 4.500 uIU/mL      Assessment & Plan:   Problem List Items Addressed This Visit      Cardiovascular and Mediastinum   Essential hypertension    Poor control of hypertension with elevation especially with weight gain. Discuss weight loss diet exercise nutrition. Patient limited exercise of course by both MS and degenerative arthritis of both knees. Will add hydrochlorothiazide to losartan recheck 1 month.      Relevant Medications   losartan-hydrochlorothiazide (HYZAAR) 100-25 MG tablet   Other Relevant Orders   Comprehensive metabolic panel     Nervous and Auditory   Multiple sclerosis ([facility])    MS appears stable will leave alone for now      Relevant Medications   tiZANidine (ZANAFLEX) 4 MG capsule     Musculoskeletal and Integument   DJD (degenerative joint disease) of knee    Worsening arthritis changes of the knees will refer to orthopedics to further evaluate and x-ray as indicated      Relevant Medications   tiZANidine (ZANAFLEX) 4 MG capsule   Other Relevant Orders   Ambulatory referral to Orthopedic Surgery     Other   Hyperlipidemia   Relevant Medications   losartan-hydrochlorothiazide (HYZAAR) 100-25 MG tablet   Other Relevant Orders   Lipid panel   Advanced care planning/counseling discussion    A voluntary discussion about advance care planning including the explanation and discussion of advance directives was extensively discussed  with the patient.  Explanation about the health care proxy and Living will was reviewed and packet with forms with explanation of how to fill them out was given.  The patient will bring back copies of  forms       Other Visit Diagnoses    Breast cancer screening    -  Primary   Relevant Orders   MM DIGITAL SCREENING BILATERAL   Annual physical exam       Screening for diabetes mellitus (DM)       Relevant Orders   CBC with Differential/Platelet   Urinalysis, Routine w reflex microscopic   Thyroid disorder screen       Relevant Orders   TSH       Follow up plan: Return in about 4 weeks (around 01/30/2017) for BMP.

## 2017-01-02 NOTE — Assessment & Plan Note (Signed)
Poor control of hypertension with elevation especially with weight gain. Discuss weight loss diet exercise nutrition. Patient limited exercise of course by both MS and degenerative arthritis of both knees. Will add hydrochlorothiazide to losartan recheck 1 month.

## 2017-01-02 NOTE — Assessment & Plan Note (Signed)
A voluntary discussion about advance care planning including the explanation and discussion of advance directives was extensively discussed  with the patient.  Explanation about the health care proxy and Living will was reviewed and packet with forms with explanation of how to fill them out was given.  The patient will bring back copies of forms

## 2017-01-02 NOTE — Assessment & Plan Note (Signed)
Worsening arthritis changes of the knees will refer to orthopedics to further evaluate and x-ray as indicated

## 2017-01-03 LAB — COMPREHENSIVE METABOLIC PANEL
ALBUMIN: 4.2 g/dL (ref 3.6–4.8)
ALT: 16 IU/L (ref 0–32)
AST: 17 IU/L (ref 0–40)
Albumin/Globulin Ratio: 1.6 (ref 1.2–2.2)
Alkaline Phosphatase: 75 IU/L (ref 39–117)
BUN / CREAT RATIO: 17 (ref 12–28)
BUN: 13 mg/dL (ref 8–27)
Bilirubin Total: 0.4 mg/dL (ref 0.0–1.2)
CALCIUM: 9.1 mg/dL (ref 8.7–10.3)
CO2: 21 mmol/L (ref 20–29)
CREATININE: 0.77 mg/dL (ref 0.57–1.00)
Chloride: 103 mmol/L (ref 96–106)
GFR calc Af Amer: 94 mL/min/{1.73_m2} (ref >59)
GFR, EST NON AFRICAN AMERICAN: 81 mL/min/{1.73_m2} (ref >59)
Globulin, Total: 2.6 g/dL (ref 1.5–4.5)
Glucose: 109 mg/dL — ABNORMAL HIGH (ref 65–99)
Potassium: 4.1 mmol/L (ref 3.5–5.2)
Sodium: 141 mmol/L (ref 134–144)
Total Protein: 6.8 g/dL (ref 6.0–8.5)

## 2017-01-03 LAB — CBC WITH DIFFERENTIAL/PLATELET
BASOS: 1 %
Basophils Absolute: 0 10*3/uL (ref 0.0–0.2)
EOS (ABSOLUTE): 0.2 10*3/uL (ref 0.0–0.4)
EOS: 3 %
HEMATOCRIT: 41 % (ref 34.0–46.6)
HEMOGLOBIN: 13.1 g/dL (ref 11.1–15.9)
IMMATURE GRANS (ABS): 0 10*3/uL (ref 0.0–0.1)
IMMATURE GRANULOCYTES: 0 %
LYMPHS: 23 %
Lymphocytes Absolute: 1.7 10*3/uL (ref 0.7–3.1)
MCH: 28.1 pg (ref 26.6–33.0)
MCHC: 32 g/dL (ref 31.5–35.7)
MCV: 88 fL (ref 79–97)
MONOS ABS: 0.4 10*3/uL (ref 0.1–0.9)
Monocytes: 6 %
NEUTROS PCT: 67 %
Neutrophils Absolute: 5.1 10*3/uL (ref 1.4–7.0)
Platelets: 42 10*3/uL — CL (ref 150–379)
RBC: 4.66 x10E6/uL (ref 3.77–5.28)
RDW: 15 % (ref 12.3–15.4)
WBC: 7.6 10*3/uL (ref 3.4–10.8)

## 2017-01-03 LAB — LIPID PANEL
CHOL/HDL RATIO: 3.7 ratio (ref 0.0–4.4)
Cholesterol, Total: 236 mg/dL — ABNORMAL HIGH (ref 100–199)
HDL: 63 mg/dL (ref >39)
LDL CALC: 143 mg/dL — AB (ref 0–99)
TRIGLYCERIDES: 150 mg/dL — AB (ref 0–149)
VLDL Cholesterol Cal: 30 mg/dL (ref 5–40)

## 2017-01-03 LAB — TSH: TSH: 4.08 u[IU]/mL (ref 0.450–4.500)

## 2017-01-06 ENCOUNTER — Telehealth: Payer: Self-pay | Admitting: Family Medicine

## 2017-01-06 NOTE — Telephone Encounter (Signed)
Phone call Discussed with patient thrombocytopenia stable will continue observing. Cholesterol remains elevated patient will do better.

## 2017-01-07 DIAGNOSIS — M170 Bilateral primary osteoarthritis of knee: Secondary | ICD-10-CM | POA: Diagnosis not present

## 2017-01-07 LAB — URINALYSIS, ROUTINE W REFLEX MICROSCOPIC
BILIRUBIN UA: NEGATIVE
Glucose, UA: NEGATIVE
Ketones, UA: NEGATIVE
LEUKOCYTES UA: NEGATIVE
Nitrite, UA: NEGATIVE
PH UA: 5.5 (ref 5.0–7.5)
SPEC GRAV UA: 1.025 (ref 1.005–1.030)
Urobilinogen, Ur: 0.2 mg/dL (ref 0.2–1.0)

## 2017-01-07 LAB — MICROSCOPIC EXAMINATION

## 2017-01-08 DIAGNOSIS — M170 Bilateral primary osteoarthritis of knee: Secondary | ICD-10-CM | POA: Diagnosis not present

## 2017-01-29 ENCOUNTER — Encounter: Payer: Self-pay | Admitting: Family Medicine

## 2017-01-29 ENCOUNTER — Ambulatory Visit (INDEPENDENT_AMBULATORY_CARE_PROVIDER_SITE_OTHER): Payer: Medicare Other | Admitting: Family Medicine

## 2017-01-29 VITALS — BP 150/74 | HR 79 | Wt 313.0 lb

## 2017-01-29 DIAGNOSIS — I10 Essential (primary) hypertension: Secondary | ICD-10-CM

## 2017-01-29 DIAGNOSIS — M170 Bilateral primary osteoarthritis of knee: Secondary | ICD-10-CM | POA: Diagnosis not present

## 2017-01-29 NOTE — Progress Notes (Signed)
BP (!) 150/74 (BP Location: Right Arm)   Pulse 79   Wt (!) 313 lb (142 kg)   SpO2 99%   BMI 50.52 kg/m    Subjective:    Patient ID: Leslie Smith, female    DOB: Nov 07, 1950, 66 y.o.   MRN: [id number]  HPI: Leslie Smith is a 66 y.o. female  Hypertension recheck Patient is been taking her new hydrochlorothiazide at bedtime and use the bathroom a lot switching over to morning and has forgotten medication this morning. Has not been checking blood pressure at home but thinks it's been pretty good. Otherwise no other issues with blood pressure medications. Patient also very interested in various diets reviewed 2 day fast diet with patient  Relevant past medical, surgical, family and social history reviewed and updated as indicated. Interim medical history since our last visit reviewed. Allergies and medications reviewed and updated.  Review of Systems  Constitutional: Negative.   Respiratory: Negative.   Cardiovascular: Negative.     Per HPI unless specifically indicated above     Objective:    BP (!) 150/74 (BP Location: Right Arm)   Pulse 79   Wt (!) 313 lb (142 kg)   SpO2 99%   BMI 50.52 kg/m   Wt Readings from Last 3 Encounters:  01/29/17 (!) 313 lb (142 kg)  01/02/17 (!) 315 lb (142.9 kg)  10/26/15 (!) 303 lb 3.2 oz (137.5 kg)    Physical Exam  Constitutional: She is oriented to person, place, and time. She appears well-developed and well-nourished.  HENT:  Head: Normocephalic and atraumatic.  Eyes: Conjunctivae and EOM are normal.  Neck: Normal range of motion.  Cardiovascular: Normal rate, regular rhythm and normal heart sounds.   Pulmonary/Chest: Effort normal and breath sounds normal.  Musculoskeletal: Normal range of motion.  Neurological: She is alert and oriented to person, place, and time.  Skin: No erythema.  Psychiatric: She has a normal mood and affect. Her behavior is normal. Judgment and thought content normal.    Results for orders placed or  performed in visit on 01/02/17  Microscopic Examination  Result Value Ref Range   WBC, UA 0-5 0 - 5 /hpf   RBC, UA 0-2 0 - 2 /hpf   Epithelial Cells (non renal) 0-10 0 - 10 /hpf   Bacteria, UA Few None seen/Few   Yeast, UA Present (A) None seen  CBC with Differential/Platelet  Result Value Ref Range   WBC 7.6 3.4 - 10.8 x10E3/uL   RBC 4.66 3.77 - 5.28 x10E6/uL   Hemoglobin 13.1 11.1 - 15.9 g/dL   Hematocrit 41.0 34.0 - 46.6 %   MCV 88 79 - 97 fL   MCH 28.1 26.6 - 33.0 pg   MCHC 32.0 31.5 - 35.7 g/dL   RDW 15.0 12.3 - 15.4 %   Platelets 42 (LL) 150 - 379 x10E3/uL   Neutrophils 67 Not Estab. %   Lymphs 23 Not Estab. %   Monocytes 6 Not Estab. %   Eos 3 Not Estab. %   Basos 1 Not Estab. %   Neutrophils Absolute 5.1 1.4 - 7.0 x10E3/uL   Lymphocytes Absolute 1.7 0.7 - 3.1 x10E3/uL   Monocytes Absolute 0.4 0.1 - 0.9 x10E3/uL   EOS (ABSOLUTE) 0.2 0.0 - 0.4 x10E3/uL   Basophils Absolute 0.0 0.0 - 0.2 x10E3/uL   Immature Granulocytes 0 Not Estab. %   Immature Grans (Abs) 0.0 0.0 - 0.1 x10E3/uL   Hematology Comments: Note:   Comprehensive  metabolic panel  Result Value Ref Range   Glucose 109 (H) 65 - 99 mg/dL   BUN 13 8 - 27 mg/dL   Creatinine, Ser 0.77 0.57 - 1.00 mg/dL   GFR calc non Af Amer 81 >59 mL/min/1.73   GFR calc Af Amer 94 >59 mL/min/1.73   BUN/Creatinine Ratio 17 12 - 28   Sodium 141 134 - 144 mmol/L   Potassium 4.1 3.5 - 5.2 mmol/L   Chloride 103 96 - 106 mmol/L   CO2 21 20 - 29 mmol/L   Calcium 9.1 8.7 - 10.3 mg/dL   Total Protein 6.8 6.0 - 8.5 g/dL   Albumin 4.2 3.6 - 4.8 g/dL   Globulin, Total 2.6 1.5 - 4.5 g/dL   Albumin/Globulin Ratio 1.6 1.2 - 2.2   Bilirubin Total 0.4 0.0 - 1.2 mg/dL   Alkaline Phosphatase 75 39 - 117 IU/L   AST 17 0 - 40 IU/L   ALT 16 0 - 32 IU/L  Lipid panel  Result Value Ref Range   Cholesterol, Total 236 (H) 100 - 199 mg/dL   Triglycerides 150 (H) 0 - 149 mg/dL   HDL 63 >39 mg/dL   VLDL Cholesterol Cal 30 5 - 40 mg/dL   LDL  Calculated 143 (H) 0 - 99 mg/dL   Chol/HDL Ratio 3.7 0.0 - 4.4 ratio  TSH  Result Value Ref Range   TSH 4.080 0.450 - 4.500 uIU/mL  Urinalysis, Routine w reflex microscopic  Result Value Ref Range   Specific Gravity, UA 1.025 1.005 - 1.030   pH, UA 5.5 5.0 - 7.5   Color, UA Yellow Yellow   Appearance Ur Clear Clear   Leukocytes, UA Negative Negative   Protein, UA 3+ (A) Negative/Trace   Glucose, UA Negative Negative   Ketones, UA Negative Negative   RBC, UA 1+ (A) Negative   Bilirubin, UA Negative Negative   Urobilinogen, Ur 0.2 0.2 - 1.0 mg/dL   Nitrite, UA Negative Negative   Microscopic Examination See below:       Assessment & Plan:   Problem List Items Addressed This Visit      Cardiovascular and Mediastinum   Essential hypertension - Primary    Blood pressure still elevated off medications patient will take medications faithfully recheck in 1 month or so to assess blood pressure control also check BMP at that time.        Musculoskeletal and Integument   DJD (degenerative joint disease) of knee    Going to physical therapy to see if that helps          Follow up plan: Return in about 4 weeks (around 02/26/2017) for BMP.

## 2017-01-29 NOTE — Assessment & Plan Note (Signed)
Blood pressure still elevated off medications patient will take medications faithfully recheck in 1 month or so to assess blood pressure control also check BMP at that time.

## 2017-01-29 NOTE — Assessment & Plan Note (Signed)
Going to physical therapy to see if that helps

## 2017-01-30 DIAGNOSIS — Z6841 Body Mass Index (BMI) 40.0 and over, adult: Secondary | ICD-10-CM | POA: Diagnosis not present

## 2017-01-30 DIAGNOSIS — G35 Multiple sclerosis: Secondary | ICD-10-CM | POA: Diagnosis not present

## 2017-02-05 DIAGNOSIS — M25562 Pain in left knee: Secondary | ICD-10-CM | POA: Diagnosis not present

## 2017-02-05 DIAGNOSIS — R609 Edema, unspecified: Secondary | ICD-10-CM | POA: Diagnosis not present

## 2017-02-05 DIAGNOSIS — M170 Bilateral primary osteoarthritis of knee: Secondary | ICD-10-CM | POA: Diagnosis not present

## 2017-02-05 DIAGNOSIS — M25561 Pain in right knee: Secondary | ICD-10-CM | POA: Diagnosis not present

## 2017-02-12 DIAGNOSIS — R609 Edema, unspecified: Secondary | ICD-10-CM | POA: Diagnosis not present

## 2017-02-12 DIAGNOSIS — M25562 Pain in left knee: Secondary | ICD-10-CM | POA: Diagnosis not present

## 2017-02-12 DIAGNOSIS — M170 Bilateral primary osteoarthritis of knee: Secondary | ICD-10-CM | POA: Diagnosis not present

## 2017-02-12 DIAGNOSIS — M25561 Pain in right knee: Secondary | ICD-10-CM | POA: Diagnosis not present

## 2017-02-18 DIAGNOSIS — R609 Edema, unspecified: Secondary | ICD-10-CM | POA: Diagnosis not present

## 2017-02-18 DIAGNOSIS — M25562 Pain in left knee: Secondary | ICD-10-CM | POA: Diagnosis not present

## 2017-02-18 DIAGNOSIS — M25561 Pain in right knee: Secondary | ICD-10-CM | POA: Diagnosis not present

## 2017-02-18 DIAGNOSIS — M170 Bilateral primary osteoarthritis of knee: Secondary | ICD-10-CM | POA: Diagnosis not present

## 2017-02-20 DIAGNOSIS — R609 Edema, unspecified: Secondary | ICD-10-CM | POA: Diagnosis not present

## 2017-02-20 DIAGNOSIS — M25561 Pain in right knee: Secondary | ICD-10-CM | POA: Diagnosis not present

## 2017-02-20 DIAGNOSIS — M25562 Pain in left knee: Secondary | ICD-10-CM | POA: Diagnosis not present

## 2017-02-20 DIAGNOSIS — M170 Bilateral primary osteoarthritis of knee: Secondary | ICD-10-CM | POA: Diagnosis not present

## 2017-02-22 DIAGNOSIS — G9389 Other specified disorders of brain: Secondary | ICD-10-CM | POA: Diagnosis not present

## 2017-02-22 DIAGNOSIS — G35 Multiple sclerosis: Secondary | ICD-10-CM | POA: Diagnosis not present

## 2017-02-24 DIAGNOSIS — M25561 Pain in right knee: Secondary | ICD-10-CM | POA: Diagnosis not present

## 2017-02-24 DIAGNOSIS — M25562 Pain in left knee: Secondary | ICD-10-CM | POA: Diagnosis not present

## 2017-02-24 DIAGNOSIS — M170 Bilateral primary osteoarthritis of knee: Secondary | ICD-10-CM | POA: Diagnosis not present

## 2017-02-24 DIAGNOSIS — R609 Edema, unspecified: Secondary | ICD-10-CM | POA: Diagnosis not present

## 2017-02-27 DIAGNOSIS — M170 Bilateral primary osteoarthritis of knee: Secondary | ICD-10-CM | POA: Diagnosis not present

## 2017-02-27 DIAGNOSIS — R609 Edema, unspecified: Secondary | ICD-10-CM | POA: Diagnosis not present

## 2017-02-27 DIAGNOSIS — M25561 Pain in right knee: Secondary | ICD-10-CM | POA: Diagnosis not present

## 2017-02-27 DIAGNOSIS — M25562 Pain in left knee: Secondary | ICD-10-CM | POA: Diagnosis not present

## 2017-03-04 DIAGNOSIS — M25562 Pain in left knee: Secondary | ICD-10-CM | POA: Diagnosis not present

## 2017-03-04 DIAGNOSIS — R609 Edema, unspecified: Secondary | ICD-10-CM | POA: Diagnosis not present

## 2017-03-04 DIAGNOSIS — M170 Bilateral primary osteoarthritis of knee: Secondary | ICD-10-CM | POA: Diagnosis not present

## 2017-03-04 DIAGNOSIS — M25561 Pain in right knee: Secondary | ICD-10-CM | POA: Diagnosis not present

## 2017-03-06 DIAGNOSIS — R609 Edema, unspecified: Secondary | ICD-10-CM | POA: Diagnosis not present

## 2017-03-06 DIAGNOSIS — M25562 Pain in left knee: Secondary | ICD-10-CM | POA: Diagnosis not present

## 2017-03-06 DIAGNOSIS — M25561 Pain in right knee: Secondary | ICD-10-CM | POA: Diagnosis not present

## 2017-03-06 DIAGNOSIS — M170 Bilateral primary osteoarthritis of knee: Secondary | ICD-10-CM | POA: Diagnosis not present

## 2017-03-11 DIAGNOSIS — M25561 Pain in right knee: Secondary | ICD-10-CM | POA: Diagnosis not present

## 2017-03-11 DIAGNOSIS — M170 Bilateral primary osteoarthritis of knee: Secondary | ICD-10-CM | POA: Diagnosis not present

## 2017-03-11 DIAGNOSIS — M25562 Pain in left knee: Secondary | ICD-10-CM | POA: Diagnosis not present

## 2017-03-11 DIAGNOSIS — R609 Edema, unspecified: Secondary | ICD-10-CM | POA: Diagnosis not present

## 2017-03-13 DIAGNOSIS — R609 Edema, unspecified: Secondary | ICD-10-CM | POA: Diagnosis not present

## 2017-03-13 DIAGNOSIS — M25561 Pain in right knee: Secondary | ICD-10-CM | POA: Diagnosis not present

## 2017-03-13 DIAGNOSIS — M170 Bilateral primary osteoarthritis of knee: Secondary | ICD-10-CM | POA: Diagnosis not present

## 2017-03-13 DIAGNOSIS — M25562 Pain in left knee: Secondary | ICD-10-CM | POA: Diagnosis not present

## 2017-03-18 DIAGNOSIS — R609 Edema, unspecified: Secondary | ICD-10-CM | POA: Diagnosis not present

## 2017-03-18 DIAGNOSIS — M25562 Pain in left knee: Secondary | ICD-10-CM | POA: Diagnosis not present

## 2017-03-18 DIAGNOSIS — M25561 Pain in right knee: Secondary | ICD-10-CM | POA: Diagnosis not present

## 2017-03-18 DIAGNOSIS — M170 Bilateral primary osteoarthritis of knee: Secondary | ICD-10-CM | POA: Diagnosis not present

## 2017-03-19 ENCOUNTER — Encounter: Payer: Self-pay | Admitting: Family Medicine

## 2017-03-19 ENCOUNTER — Ambulatory Visit (INDEPENDENT_AMBULATORY_CARE_PROVIDER_SITE_OTHER): Payer: Medicare Other | Admitting: Family Medicine

## 2017-03-19 VITALS — BP 138/66 | HR 71 | Wt 312.0 lb

## 2017-03-19 DIAGNOSIS — G35 Multiple sclerosis: Secondary | ICD-10-CM

## 2017-03-19 DIAGNOSIS — M170 Bilateral primary osteoarthritis of knee: Secondary | ICD-10-CM

## 2017-03-19 DIAGNOSIS — I10 Essential (primary) hypertension: Secondary | ICD-10-CM | POA: Diagnosis not present

## 2017-03-19 MED ORDER — LOSARTAN POTASSIUM-HCTZ 100-25 MG PO TABS: 1.0000 | ORAL_TABLET | Freq: Every day | ORAL | 3 refills | Status: DC

## 2017-03-19 NOTE — Assessment & Plan Note (Signed)
stable °

## 2017-03-19 NOTE — Progress Notes (Signed)
BP 138/66 (BP Location: Left Arm)   Pulse 71   Wt (!) 312 lb (141.5 kg)   SpO2 99%   BMI 50.36 kg/m    Subjective:    Patient ID: Leslie Smith, female    DOB: 02-23-1951, 66 y.o.   MRN: [id number]  HPI: Leslie Smith is a 66 y.o. female  Patient follow-up hypertension all in all doing well having some elevated readings at home but associated with high stress activity. Blood pressure all in all doing well with no complaints from medications which taken faithfully. Going to physical therapy for her knee. Patient also working on weight loss and is been successful with 3 pounds down. Relevant past medical, surgical, family and social history reviewed and updated as indicated. Interim medical history since our last visit reviewed. Allergies and medications reviewed and updated.  Review of Systems  Constitutional: Negative.   Respiratory: Negative.   Cardiovascular: Negative.     Per HPI unless specifically indicated above     Objective:    BP 138/66 (BP Location: Left Arm)   Pulse 71   Wt (!) 312 lb (141.5 kg)   SpO2 99%   BMI 50.36 kg/m   Wt Readings from Last 3 Encounters:  03/19/17 (!) 312 lb (141.5 kg)  01/29/17 (!) 313 lb (142 kg)  01/02/17 (!) 315 lb (142.9 kg)    Physical Exam  Constitutional: She is oriented to person, place, and time. She appears well-developed and well-nourished.  HENT:  Head: Normocephalic and atraumatic.  Eyes: Conjunctivae and EOM are normal.  Neck: Normal range of motion.  Cardiovascular: Normal rate, regular rhythm and normal heart sounds.   Pulmonary/Chest: Effort normal and breath sounds normal.  Musculoskeletal: Normal range of motion.  Neurological: She is alert and oriented to person, place, and time.  Skin: No erythema.  Psychiatric: She has a normal mood and affect. Her behavior is normal. Judgment and thought content normal.    Results for orders placed or performed in visit on 01/02/17  Microscopic Examination  Result  Value Ref Range   WBC, UA 0-5 0 - 5 /hpf   RBC, UA 0-2 0 - 2 /hpf   Epithelial Cells (non renal) 0-10 0 - 10 /hpf   Bacteria, UA Few None seen/Few   Yeast, UA Present (A) None seen  CBC with Differential/Platelet  Result Value Ref Range   WBC 7.6 3.4 - 10.8 x10E3/uL   RBC 4.66 3.77 - 5.28 x10E6/uL   Hemoglobin 13.1 11.1 - 15.9 g/dL   Hematocrit 41.0 34.0 - 46.6 %   MCV 88 79 - 97 fL   MCH 28.1 26.6 - 33.0 pg   MCHC 32.0 31.5 - 35.7 g/dL   RDW 15.0 12.3 - 15.4 %   Platelets 42 (LL) 150 - 379 x10E3/uL   Neutrophils 67 Not Estab. %   Lymphs 23 Not Estab. %   Monocytes 6 Not Estab. %   Eos 3 Not Estab. %   Basos 1 Not Estab. %   Neutrophils Absolute 5.1 1.4 - 7.0 x10E3/uL   Lymphocytes Absolute 1.7 0.7 - 3.1 x10E3/uL   Monocytes Absolute 0.4 0.1 - 0.9 x10E3/uL   EOS (ABSOLUTE) 0.2 0.0 - 0.4 x10E3/uL   Basophils Absolute 0.0 0.0 - 0.2 x10E3/uL   Immature Granulocytes 0 Not Estab. %   Immature Grans (Abs) 0.0 0.0 - 0.1 x10E3/uL   Hematology Comments: Note:   Comprehensive metabolic panel  Result Value Ref Range   Glucose 109 (H)  65 - 99 mg/dL   BUN 13 8 - 27 mg/dL   Creatinine, Ser 0.77 0.57 - 1.00 mg/dL   GFR calc non Af Amer 81 >59 mL/min/1.73   GFR calc Af Amer 94 >59 mL/min/1.73   BUN/Creatinine Ratio 17 12 - 28   Sodium 141 134 - 144 mmol/L   Potassium 4.1 3.5 - 5.2 mmol/L   Chloride 103 96 - 106 mmol/L   CO2 21 20 - 29 mmol/L   Calcium 9.1 8.7 - 10.3 mg/dL   Total Protein 6.8 6.0 - 8.5 g/dL   Albumin 4.2 3.6 - 4.8 g/dL   Globulin, Total 2.6 1.5 - 4.5 g/dL   Albumin/Globulin Ratio 1.6 1.2 - 2.2   Bilirubin Total 0.4 0.0 - 1.2 mg/dL   Alkaline Phosphatase 75 39 - 117 IU/L   AST 17 0 - 40 IU/L   ALT 16 0 - 32 IU/L  Lipid panel  Result Value Ref Range   Cholesterol, Total 236 (H) 100 - 199 mg/dL   Triglycerides 150 (H) 0 - 149 mg/dL   HDL 63 >39 mg/dL   VLDL Cholesterol Cal 30 5 - 40 mg/dL   LDL Calculated 143 (H) 0 - 99 mg/dL   Chol/HDL Ratio 3.7 0.0 - 4.4 ratio   TSH  Result Value Ref Range   TSH 4.080 0.450 - 4.500 uIU/mL  Urinalysis, Routine w reflex microscopic  Result Value Ref Range   Specific Gravity, UA 1.025 1.005 - 1.030   pH, UA 5.5 5.0 - 7.5   Color, UA Yellow Yellow   Appearance Ur Clear Clear   Leukocytes, UA Negative Negative   Protein, UA 3+ (A) Negative/Trace   Glucose, UA Negative Negative   Ketones, UA Negative Negative   RBC, UA 1+ (A) Negative   Bilirubin, UA Negative Negative   Urobilinogen, Ur 0.2 0.2 - 1.0 mg/dL   Nitrite, UA Negative Negative   Microscopic Examination See below:       Assessment & Plan:   Problem List Items Addressed This Visit      Cardiovascular and Mediastinum   Essential hypertension - Primary    The current medical regimen is effective;  continue present plan and medications.       Relevant Medications   losartan-hydrochlorothiazide (HYZAAR) 100-25 MG tablet   Other Relevant Orders   Basic metabolic panel     Nervous and Auditory   Multiple sclerosis (HCC)    stable        Musculoskeletal and Integument   DJD (degenerative joint disease) of knee    The current medical regimen is effective;  continue present plan and medications.           Follow up plan: Return for BMP, winter for recheck BP.

## 2017-03-19 NOTE — Assessment & Plan Note (Signed)
The current medical regimen is effective;  continue present plan and medications.  

## 2017-03-20 ENCOUNTER — Encounter: Payer: Self-pay | Admitting: Family Medicine

## 2017-03-20 DIAGNOSIS — M25561 Pain in right knee: Secondary | ICD-10-CM | POA: Diagnosis not present

## 2017-03-20 DIAGNOSIS — M25562 Pain in left knee: Secondary | ICD-10-CM | POA: Diagnosis not present

## 2017-03-20 DIAGNOSIS — M170 Bilateral primary osteoarthritis of knee: Secondary | ICD-10-CM | POA: Diagnosis not present

## 2017-03-20 LAB — BASIC METABOLIC PANEL
BUN / CREAT RATIO: 22 (ref 12–28)
BUN: 17 mg/dL (ref 8–27)
CHLORIDE: 101 mmol/L (ref 96–106)
CO2: 23 mmol/L (ref 20–29)
Calcium: 9.1 mg/dL (ref 8.7–10.3)
Creatinine, Ser: 0.77 mg/dL (ref 0.57–1.00)
GFR calc non Af Amer: 81 mL/min/{1.73_m2} (ref >59)
GFR, EST AFRICAN AMERICAN: 93 mL/min/{1.73_m2} (ref >59)
Glucose: 86 mg/dL (ref 65–99)
POTASSIUM: 3.8 mmol/L (ref 3.5–5.2)
Sodium: 139 mmol/L (ref 134–144)

## 2017-03-25 DIAGNOSIS — M25561 Pain in right knee: Secondary | ICD-10-CM | POA: Diagnosis not present

## 2017-03-25 DIAGNOSIS — R609 Edema, unspecified: Secondary | ICD-10-CM | POA: Diagnosis not present

## 2017-03-25 DIAGNOSIS — M170 Bilateral primary osteoarthritis of knee: Secondary | ICD-10-CM | POA: Diagnosis not present

## 2017-03-25 DIAGNOSIS — M25562 Pain in left knee: Secondary | ICD-10-CM | POA: Diagnosis not present

## 2017-03-27 DIAGNOSIS — R609 Edema, unspecified: Secondary | ICD-10-CM | POA: Diagnosis not present

## 2017-03-27 DIAGNOSIS — M25561 Pain in right knee: Secondary | ICD-10-CM | POA: Diagnosis not present

## 2017-03-27 DIAGNOSIS — M25562 Pain in left knee: Secondary | ICD-10-CM | POA: Diagnosis not present

## 2017-03-27 DIAGNOSIS — M170 Bilateral primary osteoarthritis of knee: Secondary | ICD-10-CM | POA: Diagnosis not present

## 2017-04-01 DIAGNOSIS — M25562 Pain in left knee: Secondary | ICD-10-CM | POA: Diagnosis not present

## 2017-04-01 DIAGNOSIS — R609 Edema, unspecified: Secondary | ICD-10-CM | POA: Diagnosis not present

## 2017-04-01 DIAGNOSIS — M25561 Pain in right knee: Secondary | ICD-10-CM | POA: Diagnosis not present

## 2017-04-01 DIAGNOSIS — M170 Bilateral primary osteoarthritis of knee: Secondary | ICD-10-CM | POA: Diagnosis not present

## 2017-04-08 DIAGNOSIS — M170 Bilateral primary osteoarthritis of knee: Secondary | ICD-10-CM | POA: Diagnosis not present

## 2017-04-08 DIAGNOSIS — M25562 Pain in left knee: Secondary | ICD-10-CM | POA: Diagnosis not present

## 2017-04-08 DIAGNOSIS — R609 Edema, unspecified: Secondary | ICD-10-CM | POA: Diagnosis not present

## 2017-04-08 DIAGNOSIS — M25561 Pain in right knee: Secondary | ICD-10-CM | POA: Diagnosis not present

## 2017-04-29 DIAGNOSIS — M25562 Pain in left knee: Secondary | ICD-10-CM | POA: Diagnosis not present

## 2017-04-29 DIAGNOSIS — M25561 Pain in right knee: Secondary | ICD-10-CM | POA: Diagnosis not present

## 2017-04-29 DIAGNOSIS — M170 Bilateral primary osteoarthritis of knee: Secondary | ICD-10-CM | POA: Diagnosis not present

## 2017-05-07 DIAGNOSIS — M25561 Pain in right knee: Secondary | ICD-10-CM | POA: Diagnosis not present

## 2017-05-07 DIAGNOSIS — M25562 Pain in left knee: Secondary | ICD-10-CM | POA: Diagnosis not present

## 2017-05-07 DIAGNOSIS — M170 Bilateral primary osteoarthritis of knee: Secondary | ICD-10-CM | POA: Diagnosis not present

## 2017-05-13 DIAGNOSIS — M25562 Pain in left knee: Secondary | ICD-10-CM | POA: Diagnosis not present

## 2017-05-13 DIAGNOSIS — M25561 Pain in right knee: Secondary | ICD-10-CM | POA: Diagnosis not present

## 2017-05-13 DIAGNOSIS — M170 Bilateral primary osteoarthritis of knee: Secondary | ICD-10-CM | POA: Diagnosis not present

## 2017-05-15 DIAGNOSIS — M25561 Pain in right knee: Secondary | ICD-10-CM | POA: Diagnosis not present

## 2017-05-15 DIAGNOSIS — M170 Bilateral primary osteoarthritis of knee: Secondary | ICD-10-CM | POA: Diagnosis not present

## 2017-05-15 DIAGNOSIS — R609 Edema, unspecified: Secondary | ICD-10-CM | POA: Diagnosis not present

## 2017-05-15 DIAGNOSIS — M25562 Pain in left knee: Secondary | ICD-10-CM | POA: Diagnosis not present

## 2017-05-22 DIAGNOSIS — M25562 Pain in left knee: Secondary | ICD-10-CM | POA: Diagnosis not present

## 2017-05-22 DIAGNOSIS — M25561 Pain in right knee: Secondary | ICD-10-CM | POA: Diagnosis not present

## 2017-05-22 DIAGNOSIS — M170 Bilateral primary osteoarthritis of knee: Secondary | ICD-10-CM | POA: Diagnosis not present

## 2017-06-05 DIAGNOSIS — M170 Bilateral primary osteoarthritis of knee: Secondary | ICD-10-CM | POA: Diagnosis not present

## 2017-06-10 DIAGNOSIS — L02419 Cutaneous abscess of limb, unspecified: Secondary | ICD-10-CM

## 2017-06-10 DIAGNOSIS — L03119 Cellulitis of unspecified part of limb: Secondary | ICD-10-CM

## 2017-06-10 HISTORY — DX: Cutaneous abscess of limb, unspecified: L02.419

## 2017-06-10 HISTORY — DX: Cellulitis of unspecified part of limb: L03.119

## 2017-07-09 ENCOUNTER — Encounter: Payer: Self-pay | Admitting: Family Medicine

## 2017-07-09 ENCOUNTER — Ambulatory Visit (INDEPENDENT_AMBULATORY_CARE_PROVIDER_SITE_OTHER): Payer: Medicare Other | Admitting: Family Medicine

## 2017-07-09 VITALS — BP 133/77 | HR 70 | Wt 316.0 lb

## 2017-07-09 DIAGNOSIS — G35 Multiple sclerosis: Secondary | ICD-10-CM | POA: Diagnosis not present

## 2017-07-09 DIAGNOSIS — Z1211 Encounter for screening for malignant neoplasm of colon: Secondary | ICD-10-CM

## 2017-07-09 DIAGNOSIS — M170 Bilateral primary osteoarthritis of knee: Secondary | ICD-10-CM

## 2017-07-09 DIAGNOSIS — I10 Essential (primary) hypertension: Secondary | ICD-10-CM

## 2017-07-09 DIAGNOSIS — Z1231 Encounter for screening mammogram for malignant neoplasm of breast: Secondary | ICD-10-CM | POA: Diagnosis not present

## 2017-07-09 DIAGNOSIS — Z1239 Encounter for other screening for malignant neoplasm of breast: Secondary | ICD-10-CM

## 2017-07-09 NOTE — Assessment & Plan Note (Signed)
Much better after physical therapy now walking without a cane

## 2017-07-09 NOTE — Progress Notes (Signed)
BP 133/77   Pulse 70   Wt (!) 316 lb (143.3 kg)   SpO2 99%   BMI 51.00 kg/m    Subjective:    Patient ID: Leslie Smith, female    DOB: Sep 20, 1950, 67 y.o.   MRN: [id number]  HPI: Leslie Smith is a 67 y.o. female  Chief Complaint  Patient presents with  . Follow-up  . Hypertension  Recheck hypertension doing well no complaints taking medication without problems or issues. Does take at night as can remember that MS stable  has some ear popping is use some oils in her ear which has caused a contact dermatitis behind her ear will discontinue the oil use Allegra.   Relevant past medical, surgical, family and social history reviewed and updated as indicated. Interim medical history since our last visit reviewed. Allergies and medications reviewed and updated.  Review of Systems  Constitutional: Negative.   Respiratory: Negative.   Cardiovascular: Negative.     Per HPI unless specifically indicated above     Objective:    BP 133/77   Pulse 70   Wt (!) 316 lb (143.3 kg)   SpO2 99%   BMI 51.00 kg/m   Wt Readings from Last 3 Encounters:  07/09/17 (!) 316 lb (143.3 kg)  03/19/17 (!) 312 lb (141.5 kg)  01/29/17 (!) 313 lb (142 kg)    Physical Exam  Constitutional: She is oriented to person, place, and time. She appears well-developed and well-nourished.  HENT:  Head: Normocephalic and atraumatic.  Right Ear: External ear normal.  Left Ear: External ear normal.  Mouth/Throat: Oropharynx is clear and moist.  Eyes: Conjunctivae and EOM are normal.  Neck: Normal range of motion.  Cardiovascular: Normal rate, regular rhythm and normal heart sounds.  Pulmonary/Chest: Effort normal and breath sounds normal.  Musculoskeletal: Normal range of motion.  Neurological: She is alert and oriented to person, place, and time.  Skin: No erythema.  Psychiatric: She has a normal mood and affect. Her behavior is normal. Judgment and thought content normal.    Results for orders  placed or performed in visit on 04/03/84  Basic metabolic panel  Result Value Ref Range   Glucose 86 65 - 99 mg/dL   BUN 17 8 - 27 mg/dL   Creatinine, Ser 0.77 0.57 - 1.00 mg/dL   GFR calc non Af Amer 81 >59 mL/min/1.73   GFR calc Af Amer 93 >59 mL/min/1.73   BUN/Creatinine Ratio 22 12 - 28   Sodium 139 134 - 144 mmol/L   Potassium 3.8 3.5 - 5.2 mmol/L   Chloride 101 96 - 106 mmol/L   CO2 23 20 - 29 mmol/L   Calcium 9.1 8.7 - 10.3 mg/dL      Assessment & Plan:   Problem List Items Addressed This Visit      Cardiovascular and Mediastinum   Essential hypertension - Primary    The current medical regimen is effective;  continue present plan and medications.       Relevant Orders   Basic metabolic panel     Nervous and Auditory   Multiple sclerosis (HCC)    Stable         Musculoskeletal and Integument   DJD (degenerative joint disease) of knee    Much better after physical therapy now walking without a cane       Other Visit Diagnoses    Colon cancer screening       Relevant Orders   Ambulatory referral  to Gastroenterology   Breast cancer screening       Relevant Orders   MM DIGITAL SCREENING BILATERAL       Follow up plan: Return in about 5 months (around 12/07/2017) for BMP.

## 2017-07-09 NOTE — Assessment & Plan Note (Signed)
Stable

## 2017-07-09 NOTE — Assessment & Plan Note (Signed)
The current medical regimen is effective;  continue present plan and medications.  

## 2017-07-10 ENCOUNTER — Encounter: Payer: Self-pay | Admitting: Family Medicine

## 2017-07-10 LAB — BASIC METABOLIC PANEL
BUN/Creatinine Ratio: 20 (ref 12–28)
BUN: 14 mg/dL (ref 8–27)
CALCIUM: 9.2 mg/dL (ref 8.7–10.3)
CHLORIDE: 103 mmol/L (ref 96–106)
CO2: 22 mmol/L (ref 20–29)
Creatinine, Ser: 0.7 mg/dL (ref 0.57–1.00)
GFR, EST AFRICAN AMERICAN: 104 mL/min/{1.73_m2} (ref >59)
GFR, EST NON AFRICAN AMERICAN: 91 mL/min/{1.73_m2} (ref >59)
Glucose: 105 mg/dL — ABNORMAL HIGH (ref 65–99)
Potassium: 4 mmol/L (ref 3.5–5.2)
Sodium: 140 mmol/L (ref 134–144)

## 2017-07-16 ENCOUNTER — Encounter: Payer: Self-pay | Admitting: *Deleted

## 2017-08-11 ENCOUNTER — Telehealth: Payer: Self-pay | Admitting: Family Medicine

## 2017-08-11 DIAGNOSIS — Z8661 Personal history of infections of the central nervous system: Secondary | ICD-10-CM | POA: Diagnosis not present

## 2017-08-11 DIAGNOSIS — G35 Multiple sclerosis: Secondary | ICD-10-CM | POA: Diagnosis not present

## 2017-08-11 NOTE — Telephone Encounter (Signed)
Copied from [place]. Topic: Quick Communication - See Telephone Encounter >> Aug 11, 2017  3:27 PM Leslie Smith, NT wrote: CRM for notification. See Telephone encounter for:   08/11/17. Pt. Calling stating that she was leaving her MS physician at [facility] and was advised to see her pcp asap due to ankle being black and blue(provider stated possible flebitis) Pcp Dr. Jeananne Smith out of office and Dr. Park Smith was available to see pt. Tomorrow 08/12/17 at 8:45 also advised pt. To go to ed if she couldn't wait until tomorrow but pt. Declined and said that she will see another doctor at her appt. On tomorrow 08/12/17

## 2017-08-12 DIAGNOSIS — L03116 Cellulitis of left lower limb: Secondary | ICD-10-CM | POA: Diagnosis not present

## 2017-08-12 DIAGNOSIS — D225 Melanocytic nevi of trunk: Secondary | ICD-10-CM | POA: Diagnosis not present

## 2017-08-12 DIAGNOSIS — L309 Dermatitis, unspecified: Secondary | ICD-10-CM | POA: Diagnosis not present

## 2017-08-12 DIAGNOSIS — L821 Other seborrheic keratosis: Secondary | ICD-10-CM | POA: Diagnosis not present

## 2017-08-14 DIAGNOSIS — M19072 Primary osteoarthritis, left ankle and foot: Secondary | ICD-10-CM | POA: Diagnosis not present

## 2017-08-14 DIAGNOSIS — I890 Lymphedema, not elsewhere classified: Secondary | ICD-10-CM | POA: Diagnosis not present

## 2017-08-26 ENCOUNTER — Ambulatory Visit (INDEPENDENT_AMBULATORY_CARE_PROVIDER_SITE_OTHER): Payer: Medicare Other | Admitting: Family Medicine

## 2017-08-26 ENCOUNTER — Encounter: Payer: Self-pay | Admitting: Family Medicine

## 2017-08-26 DIAGNOSIS — L03116 Cellulitis of left lower limb: Secondary | ICD-10-CM | POA: Diagnosis not present

## 2017-08-26 MED ORDER — MUPIROCIN 2 % EX OINT: 1.0000 "application " | TOPICAL_OINTMENT | Freq: Two times a day (BID) | CUTANEOUS | 0 refills | Status: DC

## 2017-08-26 NOTE — Assessment & Plan Note (Signed)
Discussed treatment with compression and elevation higher than her nose. Discussed skin care with Vaseline etc. Discussed use of Bactroban ointment Discussed long-term compression

## 2017-08-26 NOTE — Progress Notes (Signed)
BP 138/81 (BP Location: Left Arm, Patient Position: Sitting, Cuff Size: Large)   Pulse 66   Wt (!) 314 lb 8 oz (142.7 kg)   SpO2 98%   BMI 50.76 kg/m    Subjective:    Patient ID: Leslie Smith, female    DOB: 10-Jun-1951, 67 y.o.   MRN: [id number]  HPI: Leslie Smith is a 67 y.o. female  Chief Complaint  Patient presents with  . Leg Pain    itching, redness and pain.   Patient tripped and fell over a rock in her friend's backyard scratched her leg developed cellulitis with edema.  Patient's been treated with cephalexin and amoxicillin.  Leg still itches a great deal is still swollen and red.  She is even been to orthopedics with x-rays with no fractures.  Was referred to physical therapy for edema.  Patient has not been wearing support hose are doing elevation. Her leg has been itching no fever or chills and there is redness.  Relevant past medical, surgical, family and social history reviewed and updated as indicated. Interim medical history since our last visit reviewed. Allergies and medications reviewed and updated.  Review of Systems  Constitutional: Negative.   Respiratory: Negative.   Cardiovascular: Negative.     Per HPI unless specifically indicated above     Objective:    BP 138/81 (BP Location: Left Arm, Patient Position: Sitting, Cuff Size: Large)   Pulse 66   Wt (!) 314 lb 8 oz (142.7 kg)   SpO2 98%   BMI 50.76 kg/m   Wt Readings from Last 3 Encounters:  08/26/17 (!) 314 lb 8 oz (142.7 kg)  07/09/17 (!) 316 lb (143.3 kg)  03/19/17 (!) 312 lb (141.5 kg)    Physical Exam  Constitutional: She is oriented to person, place, and time. She appears well-developed and well-nourished.  HENT:  Head: Normocephalic and atraumatic.  Eyes: Conjunctivae and EOM are normal.  Neck: Normal range of motion.  Cardiovascular: Normal rate, regular rhythm and normal heart sounds.  Pulmonary/Chest: Effort normal and breath sounds normal.  Musculoskeletal: Normal range of  motion.  Neurological: She is alert and oriented to person, place, and time.  Skin: No erythema.  Cellulitis changes left medial calf adjacent to her ankle with edema  Psychiatric: She has a normal mood and affect. Her behavior is normal. Judgment and thought content normal.    Results for orders placed or performed in visit on 40/98/11  Basic metabolic panel  Result Value Ref Range   Glucose 105 (H) 65 - 99 mg/dL   BUN 14 8 - 27 mg/dL   Creatinine, Ser 0.70 0.57 - 1.00 mg/dL   GFR calc non Af Amer 91 >59 mL/min/1.73   GFR calc Af Amer 104 >59 mL/min/1.73   BUN/Creatinine Ratio 20 12 - 28   Sodium 140 134 - 144 mmol/L   Potassium 4.0 3.5 - 5.2 mmol/L   Chloride 103 96 - 106 mmol/L   CO2 22 20 - 29 mmol/L   Calcium 9.2 8.7 - 10.3 mg/dL      Assessment & Plan:   Problem List Items Addressed This Visit      Other   Cellulitis of left lower leg    Discussed treatment with compression and elevation higher than her nose. Discussed skin care with Vaseline etc. Discussed use of Bactroban ointment Discussed long-term compression          Follow up plan: Return for As scheduled.

## 2017-09-09 ENCOUNTER — Other Ambulatory Visit: Payer: Self-pay

## 2017-09-09 ENCOUNTER — Telehealth: Payer: Self-pay | Admitting: Gastroenterology

## 2017-09-09 DIAGNOSIS — Z1211 Encounter for screening for malignant neoplasm of colon: Secondary | ICD-10-CM

## 2017-09-09 NOTE — Telephone Encounter (Signed)
Colonoscopy patient has been triaged

## 2017-09-09 NOTE — Telephone Encounter (Signed)
Gastroenterology Pre-Procedure Review  Request Date: 11/11/17 Requesting Physician: Dr. Allen Norris  PATIENT REVIEW QUESTIONS: The patient responded to the following health history questions as indicated:    1. Are you having any GI issues? no 2. Do you have a personal history of Polyps? yes (2013 [place]) 3. Do you have a family history of Colon Cancer or Polyps? no 4. Diabetes Mellitus? no 5. Joint replacements in the past 12 months?no 6. Major health problems in the past 3 months?no 7. Any artificial heart valves, MVP, or defibrillator?no    MEDICATIONS & ALLERGIES:    Patient reports the following regarding taking any anticoagulation/antiplatelet therapy:   Plavix, Coumadin, Eliquis, Xarelto, Lovenox, Pradaxa, Brilinta, or Effient? no Aspirin? no  Patient confirms/reports the following medications:  Current Outpatient Medications  Medication Sig Dispense Refill  . ibuprofen (ADVIL,MOTRIN) 800 MG tablet Take 1 tablet (800 mg total) by mouth every 8 (eight) hours as needed for mild pain or moderate pain. 30 tablet 6  . losartan-hydrochlorothiazide (HYZAAR) 100-25 MG tablet Take 1 tablet by mouth daily. 90 tablet 3  . mupirocin ointment (BACTROBAN) 2 % Place 1 application into the nose 2 (two) times daily. 22 g 0   No current facility-administered medications for this visit.     Patient confirms/reports the following allergies:  Allergies  Allergen Reactions  . Atorvastatin Other (See Comments)    Word finding  . Meloxicam Nausea Only  . Watermelon [Citrullus Vulgaris]     No orders of the defined types were placed in this encounter.   AUTHORIZATION INFORMATION Primary Insurance: 1D#: Group #:  Secondary Insurance: 1D#: Group #:  SCHEDULE INFORMATION: Date: 11/11/17 Time: Location:[facility]

## 2017-09-12 ENCOUNTER — Telehealth: Payer: Self-pay | Admitting: Family Medicine

## 2017-09-12 ENCOUNTER — Telehealth: Payer: Self-pay | Admitting: Gastroenterology

## 2017-09-12 ENCOUNTER — Encounter: Payer: Self-pay | Admitting: Gastroenterology

## 2017-09-12 NOTE — Telephone Encounter (Signed)
Patient called and wants to cancel procedure as she is already established with [facility].

## 2017-09-12 NOTE — Telephone Encounter (Signed)
Copied from [place]. Topic: Quick Communication - See Telephone Encounter >> Sep 12, 2017  2:44 PM Bea Graff, NT wrote: CRM for notification. See Telephone encounter for: 09/12/17.Pt was is unsure if Dr. Jeananne Rama wanted her to go to [facility] or if she is ok for scheduling at [facility] for her colonoscopy? She would like to know which place she should go to.

## 2017-09-15 NOTE — Telephone Encounter (Signed)
Error

## 2017-09-16 ENCOUNTER — Other Ambulatory Visit: Payer: Self-pay | Admitting: Gastroenterology

## 2017-09-16 DIAGNOSIS — D696 Thrombocytopenia, unspecified: Secondary | ICD-10-CM | POA: Diagnosis not present

## 2017-09-16 DIAGNOSIS — K5790 Diverticulosis of intestine, part unspecified, without perforation or abscess without bleeding: Secondary | ICD-10-CM | POA: Diagnosis not present

## 2017-09-16 DIAGNOSIS — R945 Abnormal results of liver function studies: Secondary | ICD-10-CM | POA: Diagnosis not present

## 2017-09-16 DIAGNOSIS — Z86010 Personal history of colonic polyps: Secondary | ICD-10-CM | POA: Diagnosis not present

## 2017-09-16 NOTE — Telephone Encounter (Signed)
Contacted [facility] Endo unit and cancelled pt's procedure.

## 2017-09-18 ENCOUNTER — Other Ambulatory Visit: Payer: Self-pay | Admitting: Gastroenterology

## 2017-09-18 DIAGNOSIS — D696 Thrombocytopenia, unspecified: Secondary | ICD-10-CM

## 2017-09-19 ENCOUNTER — Ambulatory Visit: Payer: Medicare Other

## 2017-09-23 ENCOUNTER — Ambulatory Visit: Payer: Medicare Other

## 2017-10-01 ENCOUNTER — Inpatient Hospital Stay
Admission: RE | Admit: 2017-10-01 | Discharge: 2017-10-01 | Disposition: A | Discharge status: Home / Self Care | Payer: Medicare Other | Source: Ambulatory Visit | Attending: Gastroenterology | Admitting: Gastroenterology

## 2017-10-10 ENCOUNTER — Other Ambulatory Visit: Payer: Medicare Other

## 2017-10-16 ENCOUNTER — Other Ambulatory Visit: Payer: Self-pay | Admitting: Family Medicine

## 2017-10-24 DIAGNOSIS — S99911A Unspecified injury of right ankle, initial encounter: Secondary | ICD-10-CM | POA: Diagnosis not present

## 2017-10-24 DIAGNOSIS — L03115 Cellulitis of right lower limb: Secondary | ICD-10-CM | POA: Diagnosis not present

## 2017-10-24 DIAGNOSIS — M7989 Other specified soft tissue disorders: Secondary | ICD-10-CM | POA: Diagnosis not present

## 2017-10-24 DIAGNOSIS — M25571 Pain in right ankle and joints of right foot: Secondary | ICD-10-CM | POA: Diagnosis not present

## 2017-11-11 ENCOUNTER — Ambulatory Visit: Admit: 2017-11-11 | Payer: Medicare Other | Admitting: Gastroenterology

## 2017-11-11 SURGERY — COLONOSCOPY WITH PROPOFOL
Anesthesia: General

## 2017-11-12 ENCOUNTER — Ambulatory Visit
Admission: RE | Admit: 2017-11-12 | Discharge: 2017-11-12 | Disposition: A | Discharge status: Home / Self Care | Payer: Medicare Other | Source: Ambulatory Visit | Attending: Gastroenterology | Admitting: Gastroenterology

## 2017-11-12 DIAGNOSIS — K439 Ventral hernia without obstruction or gangrene: Secondary | ICD-10-CM | POA: Diagnosis not present

## 2017-11-12 DIAGNOSIS — K5790 Diverticulosis of intestine, part unspecified, without perforation or abscess without bleeding: Secondary | ICD-10-CM | POA: Diagnosis not present

## 2017-11-12 DIAGNOSIS — D696 Thrombocytopenia, unspecified: Secondary | ICD-10-CM

## 2017-11-12 LAB — HM CT VIRTUAL COLONOSCOPY

## 2017-12-09 DIAGNOSIS — H2513 Age-related nuclear cataract, bilateral: Secondary | ICD-10-CM | POA: Diagnosis not present

## 2017-12-17 ENCOUNTER — Ambulatory Visit (INDEPENDENT_AMBULATORY_CARE_PROVIDER_SITE_OTHER): Payer: Medicare Other

## 2017-12-17 VITALS — BP 142/80 | HR 74 | Temp 97.5°F | Ht 65.0 in | Wt 316.5 lb

## 2017-12-17 DIAGNOSIS — Z780 Asymptomatic menopausal state: Secondary | ICD-10-CM | POA: Diagnosis not present

## 2017-12-17 DIAGNOSIS — E785 Hyperlipidemia, unspecified: Secondary | ICD-10-CM

## 2017-12-17 DIAGNOSIS — Z0000 Encounter for general adult medical examination without abnormal findings: Secondary | ICD-10-CM

## 2017-12-17 DIAGNOSIS — I10 Essential (primary) hypertension: Secondary | ICD-10-CM

## 2017-12-17 LAB — URINALYSIS, ROUTINE W REFLEX MICROSCOPIC
Bilirubin, UA: NEGATIVE
GLUCOSE, UA: NEGATIVE
Ketones, UA: NEGATIVE
Leukocytes, UA: NEGATIVE
NITRITE UA: NEGATIVE
SPEC GRAV UA: 1.02 (ref 1.005–1.030)
Urobilinogen, Ur: 0.2 mg/dL (ref 0.2–1.0)
pH, UA: 5.5 (ref 5.0–7.5)

## 2017-12-17 LAB — MICROSCOPIC EXAMINATION: BACTERIA UA: NONE SEEN

## 2017-12-17 NOTE — Patient Instructions (Signed)
Leslie Smith , Thank you for taking time to come for your Medicare Wellness Visit. I appreciate your ongoing commitment to your health goals. Please review the following plan we discussed and let me know if I can assist you in the future.   Screening recommendations/referrals: Colonoscopy: completed 10/2017 Mammogram: Please call [phone] to schedule your mammogram.  Bone Density: Please call [phone] to schedule. Recommended yearly ophthalmology/optometry visit for glaucoma screening and checkup Recommended yearly dental visit for hygiene and checkup  Vaccinations: Influenza vaccine:due 02/2018 Pneumococcal vaccine: due now- declined  Tdap vaccine: up to date  Shingles vaccine: shingrix eligible, check with your insurance company for coverage   Advanced directives: Advance directive discussed with you today. I have provided a copy for you to complete at home and have notarized. Once this is complete please bring a copy in to our office so we can scan it into your chart.  Conditions/risks identified: Recommend drinking at least 6-8 glasses of water a day   Next appointment: Follow up on 12/24/2017 at 10:30am with Dr.Crissman. Follow up in one year for your annual wellness exam.    Preventive Care 65 Years and Older, Female Preventive care refers to lifestyle choices and visits with your health care provider that can promote health and wellness. What does preventive care include?  A yearly physical exam. This is also called an annual well check.  Dental exams once or twice a year.  Routine eye exams. Ask your health care provider how often you should have your eyes checked.  Personal lifestyle choices, including:  Daily care of your teeth and gums.  Regular physical activity.  Eating a healthy diet.  Avoiding tobacco and drug use.  Limiting alcohol use.  Practicing safe sex.  Taking low-dose aspirin every day.  Taking vitamin and mineral supplements as recommended by  your health care provider. What happens during an annual well check? The services and screenings done by your health care provider during your annual well check will depend on your age, overall health, lifestyle risk factors, and family history of disease. Counseling  Your health care provider may ask you questions about your:  Alcohol use.  Tobacco use.  Drug use.  Emotional well-being.  Home and relationship well-being.  Sexual activity.  Eating habits.  History of falls.  Memory and ability to understand (cognition).  Work and work Statistician.  Reproductive health. Screening  You may have the following tests or measurements:  Height, weight, and BMI.  Blood pressure.  Lipid and cholesterol levels. These may be checked every 5 years, or more frequently if you are over 37 years old.  Skin check.  Lung cancer screening. You may have this screening every year starting at age 49 if you have a 30-pack-year history of smoking and currently smoke or have quit within the past 15 years.  Fecal occult blood test (FOBT) of the stool. You may have this test every year starting at age 74.  Flexible sigmoidoscopy or colonoscopy. You may have a sigmoidoscopy every 5 years or a colonoscopy every 10 years starting at age 24.  Hepatitis C blood test.  Hepatitis B blood test.  Sexually transmitted disease (STD) testing.  Diabetes screening. This is done by checking your blood sugar (glucose) after you have not eaten for a while (fasting). You may have this done every 1-3 years.  Bone density scan. This is done to screen for osteoporosis. You may have this done starting at age 41.  Mammogram. This may be done  every 1-2 years. Talk to your health care provider about how often you should have regular mammograms. Talk with your health care provider about your test results, treatment options, and if necessary, the need for more tests. Vaccines  Your health care provider may  recommend certain vaccines, such as:  Influenza vaccine. This is recommended every year.  Tetanus, diphtheria, and acellular pertussis (Tdap, Td) vaccine. You may need a Td booster every 10 years.  Zoster vaccine. You may need this after age 69.  Pneumococcal 13-valent conjugate (PCV13) vaccine. One dose is recommended after age 37.  Pneumococcal polysaccharide (PPSV23) vaccine. One dose is recommended after age 70. Talk to your health care provider about which screenings and vaccines you need and how often you need them. This information is not intended to replace advice given to you by your health care provider. Make sure you discuss any questions you have with your health care provider. Document Released: 06/23/2015 Document Revised: 02/14/2016 Document Reviewed: 03/28/2015 Elsevier Interactive Patient Education  2017 [place] Prevention in the Home Falls can cause injuries. They can happen to people of all ages. There are many things you can do to make your home safe and to help prevent falls. What can I do on the outside of my home?  Regularly fix the edges of walkways and driveways and fix any cracks.  Remove anything that might make you trip as you walk through a door, such as a raised step or threshold.  Trim any bushes or trees on the path to your home.  Use bright outdoor lighting.  Clear any walking paths of anything that might make someone trip, such as rocks or tools.  Regularly check to see if handrails are loose or broken. Make sure that both sides of any steps have handrails.  Any raised decks and porches should have guardrails on the edges.  Have any leaves, snow, or ice cleared regularly.  Use sand or salt on walking paths during winter.  Clean up any spills in your garage right away. This includes oil or grease spills. What can I do in the bathroom?  Use night lights.  Install grab bars by the toilet and in the tub and shower. Do not use towel  bars as grab bars.  Use non-skid mats or decals in the tub or shower.  If you need to sit down in the shower, use a plastic, non-slip stool.  Keep the floor dry. Clean up any water that spills on the floor as soon as it happens.  Remove soap buildup in the tub or shower regularly.  Attach bath mats securely with double-sided non-slip rug tape.  Do not have throw rugs and other things on the floor that can make you trip. What can I do in the bedroom?  Use night lights.  Make sure that you have a light by your bed that is easy to reach.  Do not use any sheets or blankets that are too big for your bed. They should not hang down onto the floor.  Have a firm chair that has side arms. You can use this for support while you get dressed.  Do not have throw rugs and other things on the floor that can make you trip. What can I do in the kitchen?  Clean up any spills right away.  Avoid walking on wet floors.  Keep items that you use a lot in easy-to-reach places.  If you need to reach something above you, use a  strong step stool that has a grab bar.  Keep electrical cords out of the way.  Do not use floor polish or wax that makes floors slippery. If you must use wax, use non-skid floor wax.  Do not have throw rugs and other things on the floor that can make you trip. What can I do with my stairs?  Do not leave any items on the stairs.  Make sure that there are handrails on both sides of the stairs and use them. Fix handrails that are broken or loose. Make sure that handrails are as long as the stairways.  Check any carpeting to make sure that it is firmly attached to the stairs. Fix any carpet that is loose or worn.  Avoid having throw rugs at the top or bottom of the stairs. If you do have throw rugs, attach them to the floor with carpet tape.  Make sure that you have a light switch at the top of the stairs and the bottom of the stairs. If you do not have them, ask someone to  add them for you. What else can I do to help prevent falls?  Wear shoes that:  Do not have high heels.  Have rubber bottoms.  Are comfortable and fit you well.  Are closed at the toe. Do not wear sandals.  If you use a stepladder:  Make sure that it is fully opened. Do not climb a closed stepladder.  Make sure that both sides of the stepladder are locked into place.  Ask someone to hold it for you, if possible.  Clearly mark and make sure that you can see:  Any grab bars or handrails.  First and last steps.  Where the edge of each step is.  Use tools that help you move around (mobility aids) if they are needed. These include:  Canes.  Walkers.  Scooters.  Crutches.  Turn on the lights when you go into a dark area. Replace any light bulbs as soon as they burn out.  Set up your furniture so you have a clear path. Avoid moving your furniture around.  If any of your floors are uneven, fix them.  If there are any pets around you, be aware of where they are.  Review your medicines with your doctor. Some medicines can make you feel dizzy. This can increase your chance of falling. Ask your doctor what other things that you can do to help prevent falls. This information is not intended to replace advice given to you by your health care provider. Make sure you discuss any questions you have with your health care provider. Document Released: 03/23/2009 Document Revised: 11/02/2015 Document Reviewed: 07/01/2014 Elsevier Interactive Patient Education  2017 [employer].

## 2017-12-17 NOTE — Progress Notes (Signed)
Subjective:   Leslie Smith is a 67 y.o. female who presents for an Initial Medicare Annual Wellness Visit.  Review of Systems    Cardiac Risk Factors include: advanced age (>49mn, >>76women);hypertension;obesity (BMI >30kg/m2)     Objective:    Today's Vitals   12/17/17 0946 12/17/17 0948  BP: (!) 142/80   Pulse: 74   Temp: (!) 97.5 F (36.4 C)   TempSrc: Temporal   SpO2: (!) 18%   Weight: (!) 316 lb 8 oz (143.6 kg)   Height: '5\' 5"'$  (1.651 m)   PainSc:  3    Body mass index is 52.67 kg/m.  Advanced Directives 12/17/2017 09/27/2015 08/25/2015  Does Patient Have a Medical Advance Directive? No Yes No  Type of Advance Directive - Living will -  Would patient like information on creating a medical advance directive? Yes (MAU/Ambulatory/Procedural Areas - Information given) - No - patient declined information    Current Medications (verified) Outpatient Encounter Medications as of 12/17/2017  Medication Sig  . IBU 800 MG tablet TAKE ONE TABLET BY MOUTH EVERY 8 HOURS AS NEEDED FOR MILD OR MODERATEPAIN  . losartan-hydrochlorothiazide (HYZAAR) 100-25 MG tablet Take 1 tablet by mouth daily.  . mupirocin ointment (BACTROBAN) 2 % Place 1 application into the nose 2 (two) times daily. (Patient not taking: Reported on 12/17/2017)   No facility-administered encounter medications on file as of 12/17/2017.     Allergies (verified) Atorvastatin; Meloxicam; and Watermelon [citrullus vulgaris]   History: Past Medical History:  Diagnosis Date  . 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 Complication of anesthesia   . Hypertension   . PONV (postoperative nausea and vomiting)    Past Surgical History:  Procedure Laterality Date  . ABDOMINAL HYSTERECTOMY    . BRAIN SURGERY  Nov. 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 BREAST SURGERY Left    biopsy  . I&D EXTREMITY Left 08/25/2015   Procedure: IRRIGATION AND DEBRIDEMENT THUMB;  Surgeon: FIran Planas MD;  [facility]  Service:  Orthopedics;  Laterality: Left;   Family History  Problem Relation Age of Onset  . Heart disease Father   . Cancer Father    Social History   Socioeconomic History  . Marital status: Single    Spouse name: Not on file  . Number of children: Not on file  . Years of education: high school, some college   . Highest education level: High school graduate  Occupational History  . Not on file  Social Needs  . Financial resource strain: Not very hard  . Food insecurity:    Worry: Never true    Inability: Never true  . Transportation needs:    Medical: No    Non-medical: No  Tobacco Use  . Smoking status: Never Smoker  . Smokeless tobacco: Never Used  Substance and Sexual Activity  . Alcohol use: Yes    Comment: occassional  . Drug use: Yes    Types: Marijuana  . Sexual activity: Not on file  Lifestyle  . Physical activity:    Days per week: 0 days    Minutes per session: 0 min  . Stress: Not at all  Relationships  . Social connections:    Talks on phone: More than three times a week    Gets together: More than three times a week    Attends religious service: More than 4 times per year    Active member of club or organization: No    Attends  meetings of clubs or organizations: Never    Relationship status: Never married  Other Topics Concern  . Not on file  Social History Narrative   Tai chi at [place] given: Not Answered   Clinical Intake:  Pre-visit preparation completed: Yes  Pain : 0-10 Pain Score: 3  Pain Type: Chronic pain Pain Location: Knee Pain Orientation: Left, Right Pain Descriptors / Indicators: Aching Pain Onset: More than a month ago Pain Frequency: Constant     Nutritional Status: BMI > 30  Obese Nutritional Risks: None Diabetes: No  How often do you need to have someone help you when you read instructions, pamphlets, or other written materials from your doctor or pharmacy?: 1 - Never What is the last  grade level you completed in school?: high school, some college   Interpreter Needed?: No  Information entered by :: Asir Bingley,LPN    Activities of Daily Living In your present state of health, do you have any difficulty performing the following activities: 12/17/2017  Hearing? N  Vision? N  Difficulty concentrating or making decisions? Y  Comment has MS  Walking or climbing stairs? Y  Comment knee pain   Dressing or bathing? N  Doing errands, shopping? N  Preparing Food and eating ? N  Using the Toilet? N  In the past six months, have you accidently leaked urine? N  Do you have problems with loss of bowel control? N  Managing your Medications? N  Managing your Finances? N  Housekeeping or managing your Housekeeping? N  Some recent data might be hidden     Immunizations and Health Maintenance Immunization History  Administered Date(s) Administered  . Td 03/04/2007, 08/12/2015   Health Maintenance Due  Topic Date Due  . MAMMOGRAM  03/28/2016  . COLONOSCOPY  04/24/2017    Patient Care Team: Guadalupe Maple, MD as PCP - General (Family Medicine)  Indicate any recent Medical Services you may have received from other than [facility] providers in the past year (date may be approximate).     Assessment:   This is a routine wellness examination for [place].  Hearing/Vision screen Vision Screening Comments: Goes to [facility] annually   Dietary issues and exercise activities discussed: Current Exercise Habits: The patient does not participate in regular exercise at present, Exercise limited by: None identified  Goals    . DIET - INCREASE WATER INTAKE     Recommend drinking at least 6-8 glasses of water a day       Depression Screen PHQ 2/9 Scores 12/17/2017 07/09/2017 01/29/2017 01/02/2017 10/26/2015  PHQ - 2 Score 0 0 0 0 1    Fall Risk Fall Risk  12/17/2017 07/09/2017 03/19/2017 01/29/2017 01/02/2017  Falls in the past year? Yes No No No No  Number falls in  past yr: 1 - - - -  Injury with Fall? Yes - - - -  Risk Factor Category  High Fall Risk - - - -  Follow up Falls prevention discussed - - - -    Is the patient's home free of loose throw rugs in walkways, pet beds, electrical cords, etc?   yes      Grab bars in the bathroom? no      Handrails on the stairs?   yes      Adequate lighting?   yes  Timed Get Up and Go Performed Completed in 9 seconds with no use of assistive devices, steady gait. No intervention  needed at this time.   Cognitive Function:     6CIT Screen 12/17/2017  What Year? 0 points  What month? 0 points  What time? 0 points  Count back from 20 0 points  Months in reverse 0 points  Repeat phrase 2 points  Total Score 2    Screening Tests Health Maintenance  Topic Date Due  . MAMMOGRAM  03/28/2016  . COLONOSCOPY  04/24/2017  . DEXA SCAN  01/02/2018 (Originally 01/06/2016)  . PNA vac Low Risk Adult (1 of 2 - PCV13) 01/02/2018 (Originally 01/06/2016)  . INFLUENZA VACCINE  01/08/2018  . TETANUS/TDAP  08/11/2025  . Hepatitis C Screening  Completed    Qualifies for Shingles Vaccine? Yes, discussed shingrix vaccine   Cancer Screenings: Lung: Low Dose CT Chest recommended if Age 67-80 years, 30 pack-year currently smoking OR have quit w/in 15years. Patient does not qualify. Breast:  Up to date on Mammogram? No , ordered Up to date of Bone Density/Dexa? No Colorectal: colonoscopy completed 11/12/2017  Additional Screenings:  Hepatitis C Screening: completed 10/26/2015    Plan:    I have personally reviewed and addressed the Medicare Annual Wellness questionnaire and have noted the following in the patient's chart:  A. Medical and social history B. Use of alcohol, tobacco or illicit drugs  C. Current medications and supplements D. Functional ability and status E.  Nutritional status F.  Physical activity G. Advance directives H. List of other physicians I.  Hospitalizations, surgeries, and ER visits in  previous 12 months J.  [place] such as hearing and vision if needed, cognitive and depression L. Referrals and appointments   In addition, I have reviewed and discussed with patient certain preventive protocols, quality metrics, and best practice recommendations. A written personalized care plan for preventive services as well as general preventive health recommendations were provided to patient.   Signed,  Tyler Aas, LPN Nurse Health Advisor   Nurse Notes:none

## 2017-12-18 LAB — COMPREHENSIVE METABOLIC PANEL
A/G RATIO: 1.6 (ref 1.2–2.2)
ALT: 16 IU/L (ref 0–32)
AST: 15 IU/L (ref 0–40)
Albumin: 3.9 g/dL (ref 3.6–4.8)
Alkaline Phosphatase: 72 IU/L (ref 39–117)
BUN / CREAT RATIO: 20 (ref 12–28)
BUN: 15 mg/dL (ref 8–27)
Bilirubin Total: 0.4 mg/dL (ref 0.0–1.2)
CALCIUM: 8.8 mg/dL (ref 8.7–10.3)
CO2: 23 mmol/L (ref 20–29)
Chloride: 104 mmol/L (ref 96–106)
Creatinine, Ser: 0.75 mg/dL (ref 0.57–1.00)
GFR, EST AFRICAN AMERICAN: 96 mL/min/{1.73_m2} (ref >59)
GFR, EST NON AFRICAN AMERICAN: 83 mL/min/{1.73_m2} (ref >59)
GLOBULIN, TOTAL: 2.5 g/dL (ref 1.5–4.5)
Glucose: 96 mg/dL (ref 65–99)
POTASSIUM: 4.2 mmol/L (ref 3.5–5.2)
SODIUM: 141 mmol/L (ref 134–144)
TOTAL PROTEIN: 6.4 g/dL (ref 6.0–8.5)

## 2017-12-18 LAB — CBC WITH DIFFERENTIAL/PLATELET
BASOS: 0 %
Basophils Absolute: 0 10*3/uL (ref 0.0–0.2)
EOS (ABSOLUTE): 0.2 10*3/uL (ref 0.0–0.4)
Eos: 2 %
HEMATOCRIT: 36.6 % (ref 34.0–46.6)
HEMOGLOBIN: 12.1 g/dL (ref 11.1–15.9)
Immature Grans (Abs): 0 10*3/uL (ref 0.0–0.1)
Immature Granulocytes: 0 %
LYMPHS ABS: 1.4 10*3/uL (ref 0.7–3.1)
Lymphs: 15 %
MCH: 29.2 pg (ref 26.6–33.0)
MCHC: 33.1 g/dL (ref 31.5–35.7)
MCV: 88 fL (ref 79–97)
MONOCYTES: 5 %
Monocytes Absolute: 0.5 10*3/uL (ref 0.1–0.9)
NEUTROS ABS: 7.3 10*3/uL — AB (ref 1.4–7.0)
Neutrophils: 78 %
Platelets: 59 10*3/uL — CL (ref 150–450)
RBC: 4.14 x10E6/uL (ref 3.77–5.28)
RDW: 15.1 % (ref 12.3–15.4)
WBC: 9.4 10*3/uL (ref 3.4–10.8)

## 2017-12-18 LAB — LIPID PANEL W/O CHOL/HDL RATIO
Cholesterol, Total: 229 mg/dL — ABNORMAL HIGH (ref 100–199)
HDL: 58 mg/dL (ref >39)
LDL Calculated: 139 mg/dL — ABNORMAL HIGH (ref 0–99)
Triglycerides: 162 mg/dL — ABNORMAL HIGH (ref 0–149)
VLDL Cholesterol Cal: 32 mg/dL (ref 5–40)

## 2017-12-18 LAB — TSH: TSH: 1.45 u[IU]/mL (ref 0.450–4.500)

## 2017-12-22 ENCOUNTER — Encounter: Payer: Self-pay | Admitting: Family Medicine

## 2017-12-24 ENCOUNTER — Encounter: Payer: Self-pay | Admitting: Family Medicine

## 2017-12-24 ENCOUNTER — Ambulatory Visit (INDEPENDENT_AMBULATORY_CARE_PROVIDER_SITE_OTHER): Payer: Medicare Other | Admitting: Family Medicine

## 2017-12-24 VITALS — BP 137/72 | HR 65 | Ht 67.0 in | Wt 314.0 lb

## 2017-12-24 DIAGNOSIS — I10 Essential (primary) hypertension: Secondary | ICD-10-CM | POA: Diagnosis not present

## 2017-12-24 DIAGNOSIS — Z7189 Other specified counseling: Secondary | ICD-10-CM | POA: Diagnosis not present

## 2017-12-24 DIAGNOSIS — G35 Multiple sclerosis: Secondary | ICD-10-CM | POA: Diagnosis not present

## 2017-12-24 DIAGNOSIS — D696 Thrombocytopenia, unspecified: Secondary | ICD-10-CM

## 2017-12-24 MED ORDER — LOSARTAN POTASSIUM-HCTZ 100-25 MG PO TABS: 1.0000 | ORAL_TABLET | Freq: Every day | ORAL | 4 refills | Status: DC

## 2017-12-24 MED ORDER — IBUPROFEN 800 MG PO TABS: ORAL_TABLET | ORAL | 6 refills | Status: DC

## 2017-12-24 NOTE — Progress Notes (Signed)
 BP 137/72   Pulse 65   Ht 5' 7" (1.702 m)   Wt (!) 314 lb (142.4 kg)   SpO2 98%   BMI 49.18 kg/m    Subjective:    Patient ID: Leslie Smith, female    DOB: 08/24/1950, 66 y.o.   MRN: [id number]  HPI: Leslie Smith is a 66 y.o. female  Chief Complaint  Patient presents with  . Follow-up    Saw Tiff last week  . Hypertension   Patient all in all doing well. MS stable followed by new MS Dr. and is doing okay except for some sleep and had tried some melatonin with no real relief or amitriptyline never even tried. Blood pressure doing well on medications no complaints. From lawnmowing had stick puncture wound in her leg which eventually worked its way out with a small piece of foreign body coming out most likely large splinter from the stick.  Symptoms have resolved now.  Relevant past medical, surgical, family and social history reviewed and updated as indicated. Interim medical history since our last visit reviewed. Allergies and medications reviewed and updated.  Review of Systems  Constitutional: Negative.   HENT: Negative.   Eyes: Negative.   Respiratory: Negative.   Cardiovascular: Negative.   Gastrointestinal: Negative.   Endocrine: Negative.   Genitourinary: Negative.   Musculoskeletal: Negative.   Skin: Negative.   Allergic/Immunologic: Negative.   Neurological: Negative.   Hematological: Negative.   Psychiatric/Behavioral: Negative.     Per HPI unless specifically indicated above     Objective:    BP 137/72   Pulse 65   Ht 5' 7" (1.702 m)   Wt (!) 314 lb (142.4 kg)   SpO2 98%   BMI 49.18 kg/m   Wt Readings from Last 3 Encounters:  12/24/17 (!) 314 lb (142.4 kg)  12/17/17 (!) 316 lb 8 oz (143.6 kg)  08/26/17 (!) 314 lb 8 oz (142.7 kg)    Physical Exam  Constitutional: She is oriented to person, place, and time. She appears well-developed and well-nourished.  HENT:  Head: Normocephalic and atraumatic.  Right Ear: External ear normal.  Left  Ear: External ear normal.  Nose: Nose normal.  Mouth/Throat: Oropharynx is clear and moist.  Eyes: Pupils are equal, round, and reactive to light. Conjunctivae and EOM are normal.  Neck: Normal range of motion. Neck supple. Carotid bruit is not present.  Cardiovascular: Normal rate, regular rhythm and normal heart sounds.  No murmur heard. Pulmonary/Chest: Effort normal and breath sounds normal. She exhibits no mass. Right breast exhibits no mass, no skin change and no tenderness. Left breast exhibits no mass, no skin change and no tenderness. Breasts are symmetrical.  Abdominal: Soft. Bowel sounds are normal. There is no hepatosplenomegaly.  Musculoskeletal: Normal range of motion.  Neurological: She is alert and oriented to person, place, and time.  Skin: No rash noted.  Psychiatric: She has a normal mood and affect. Her behavior is normal. Judgment and thought content normal.    Results for orders placed or performed in visit on 12/17/17  Microscopic Examination  Result Value Ref Range   WBC, UA 0-5 0 - 5 /hpf   RBC, UA 0-2 0 - 2 /hpf   Epithelial Cells (non renal) 0-10 0 - 10 /hpf   Bacteria, UA None seen None seen/Few  HM CT VIRTUAL COLONOSCOPY  Result Value Ref Range   HM CT Virtual Colonoscopy See report (in chart)   Urinalysis, Routine w reflex microscopic    Result Value Ref Range   Specific Gravity, UA 1.020 1.005 - 1.030   pH, UA 5.5 5.0 - 7.5   Color, UA Yellow Yellow   Appearance Ur Clear Clear   Leukocytes, UA Negative Negative   Protein, UA 1+ (A) Negative/Trace   Glucose, UA Negative Negative   Ketones, UA Negative Negative   RBC, UA Trace (A) Negative   Bilirubin, UA Negative Negative   Urobilinogen, Ur 0.2 0.2 - 1.0 mg/dL   Nitrite, UA Negative Negative   Microscopic Examination See below:   CBC with Differential  Result Value Ref Range   WBC 9.4 3.4 - 10.8 x10E3/uL   RBC 4.14 3.77 - 5.28 x10E6/uL   Hemoglobin 12.1 11.1 - 15.9 g/dL   Hematocrit 36.6 34.0 -  46.6 %   MCV 88 79 - 97 fL   MCH 29.2 26.6 - 33.0 pg   MCHC 33.1 31.5 - 35.7 g/dL   RDW 15.1 12.3 - 15.4 %   Platelets 59 (LL) 150 - 450 x10E3/uL   Neutrophils 78 Not Estab. %   Lymphs 15 Not Estab. %   Monocytes 5 Not Estab. %   Eos 2 Not Estab. %   Basos 0 Not Estab. %   Neutrophils Absolute 7.3 (H) 1.4 - 7.0 x10E3/uL   Lymphocytes Absolute 1.4 0.7 - 3.1 x10E3/uL   Monocytes Absolute 0.5 0.1 - 0.9 x10E3/uL   EOS (ABSOLUTE) 0.2 0.0 - 0.4 x10E3/uL   Basophils Absolute 0.0 0.0 - 0.2 x10E3/uL   Immature Granulocytes 0 Not Estab. %   Immature Grans (Abs) 0.0 0.0 - 0.1 x10E3/uL   Hematology Comments: Note:   Comp Met (CMET)  Result Value Ref Range   Glucose 96 65 - 99 mg/dL   BUN 15 8 - 27 mg/dL   Creatinine, Ser 0.75 0.57 - 1.00 mg/dL   GFR calc non Af Amer 83 >59 mL/min/1.73   GFR calc Af Amer 96 >59 mL/min/1.73   BUN/Creatinine Ratio 20 12 - 28   Sodium 141 134 - 144 mmol/L   Potassium 4.2 3.5 - 5.2 mmol/L   Chloride 104 96 - 106 mmol/L   CO2 23 20 - 29 mmol/L   Calcium 8.8 8.7 - 10.3 mg/dL   Total Protein 6.4 6.0 - 8.5 g/dL   Albumin 3.9 3.6 - 4.8 g/dL   Globulin, Total 2.5 1.5 - 4.5 g/dL   Albumin/Globulin Ratio 1.6 1.2 - 2.2   Bilirubin Total 0.4 0.0 - 1.2 mg/dL   Alkaline Phosphatase 72 39 - 117 IU/L   AST 15 0 - 40 IU/L   ALT 16 0 - 32 IU/L  Lipid Panel w/o Chol/HDL Ratio  Result Value Ref Range   Cholesterol, Total 229 (H) 100 - 199 mg/dL   Triglycerides 162 (H) 0 - 149 mg/dL   HDL 58 >39 mg/dL   VLDL Cholesterol Cal 32 5 - 40 mg/dL   LDL Calculated 139 (H) 0 - 99 mg/dL  TSH  Result Value Ref Range   TSH 1.450 0.450 - 4.500 uIU/mL      Assessment & Plan:   Problem List Items Addressed This Visit      Cardiovascular and Mediastinum   Essential hypertension - Primary    The current medical regimen is effective;  continue present plan and medications.       Relevant Medications   losartan-hydrochlorothiazide (HYZAAR) 100-25 MG tablet     Nervous and  Auditory   Multiple sclerosis (HCC)    Stable and followed  by neurology        Other   Thrombocytopenia (HCC)    Stable thrombocytopenia no symptoms platlet count actually increased some from last year      Advanced care planning/counseling discussion    A voluntary discussion about advanced care planning including explanation and discussion of advanced directives was extentively discussed with the patient.  Explained about the healthcare proxy and living will was reviewed and packet with forms with expiration of how to fill them out was given.  Time spent: Encounter 16+ min individuals present: Patient          Follow up plan: Return in about 6 months (around 06/26/2018) for BMP.      

## 2017-12-24 NOTE — Assessment & Plan Note (Signed)
The current medical regimen is effective;  continue present plan and medications.  

## 2017-12-24 NOTE — Assessment & Plan Note (Signed)
Stable thrombocytopenia no symptoms platlet count actually increased some from last year

## 2017-12-24 NOTE — Assessment & Plan Note (Signed)
Stable and followed by neurology

## 2017-12-24 NOTE — Assessment & Plan Note (Signed)
A voluntary discussion about advanced care planning including explanation and discussion of advanced directives was extentively discussed with the patient.  Explained about the healthcare proxy and living will was reviewed and packet with forms with expiration of how to fill them out was given.  Time spent: Encounter 16+ min individuals present: Patient 

## 2018-02-12 DIAGNOSIS — R5383 Other fatigue: Secondary | ICD-10-CM | POA: Diagnosis not present

## 2018-02-12 DIAGNOSIS — Z8661 Personal history of infections of the central nervous system: Secondary | ICD-10-CM | POA: Diagnosis not present

## 2018-02-18 ENCOUNTER — Other Ambulatory Visit: Payer: Self-pay

## 2018-02-18 ENCOUNTER — Encounter: Payer: Self-pay | Admitting: Emergency Medicine

## 2018-02-18 ENCOUNTER — Emergency Department
Admission: EM | Admit: 2018-02-18 | Discharge: 2018-02-18 | Disposition: A | Discharge status: Home / Self Care | Payer: Medicare Other | Attending: Student in an Organized Health Care Education/Training Program | Admitting: Student in an Organized Health Care Education/Training Program

## 2018-02-18 ENCOUNTER — Emergency Department: Payer: Medicare Other

## 2018-02-18 DIAGNOSIS — M25572 Pain in left ankle and joints of left foot: Secondary | ICD-10-CM | POA: Diagnosis not present

## 2018-02-18 DIAGNOSIS — S82832A Other fracture of upper and lower end of left fibula, initial encounter for closed fracture: Secondary | ICD-10-CM | POA: Diagnosis not present

## 2018-02-18 DIAGNOSIS — Y9389 Activity, other specified: Secondary | ICD-10-CM | POA: Insufficient documentation

## 2018-02-18 DIAGNOSIS — Y998 Other external cause status: Secondary | ICD-10-CM | POA: Diagnosis not present

## 2018-02-18 DIAGNOSIS — R2681 Unsteadiness on feet: Secondary | ICD-10-CM | POA: Diagnosis not present

## 2018-02-18 DIAGNOSIS — D696 Thrombocytopenia, unspecified: Secondary | ICD-10-CM

## 2018-02-18 DIAGNOSIS — Z79899 Other long term (current) drug therapy: Secondary | ICD-10-CM | POA: Insufficient documentation

## 2018-02-18 DIAGNOSIS — E785 Hyperlipidemia, unspecified: Secondary | ICD-10-CM | POA: Diagnosis not present

## 2018-02-18 DIAGNOSIS — S99912A Unspecified injury of left ankle, initial encounter: Secondary | ICD-10-CM | POA: Diagnosis present

## 2018-02-18 DIAGNOSIS — Y929 Unspecified place or not applicable: Secondary | ICD-10-CM | POA: Insufficient documentation

## 2018-02-18 DIAGNOSIS — S82852A Displaced trimalleolar fracture of left lower leg, initial encounter for closed fracture: Secondary | ICD-10-CM | POA: Diagnosis not present

## 2018-02-18 DIAGNOSIS — Y33XXXA Other specified events, undetermined intent, initial encounter: Secondary | ICD-10-CM | POA: Insufficient documentation

## 2018-02-18 DIAGNOSIS — I10 Essential (primary) hypertension: Secondary | ICD-10-CM | POA: Insufficient documentation

## 2018-02-18 DIAGNOSIS — W108XXA Fall (on) (from) other stairs and steps, initial encounter: Secondary | ICD-10-CM | POA: Diagnosis not present

## 2018-02-18 DIAGNOSIS — G35 Multiple sclerosis: Secondary | ICD-10-CM | POA: Insufficient documentation

## 2018-02-18 DIAGNOSIS — S8252XA Displaced fracture of medial malleolus of left tibia, initial encounter for closed fracture: Secondary | ICD-10-CM | POA: Diagnosis not present

## 2018-02-18 HISTORY — DX: Multiple sclerosis: G35

## 2018-02-18 MED ORDER — ONDANSETRON HCL 4 MG/2ML IJ SOLN
4.0000 mg | Freq: Once | INTRAMUSCULAR | Status: AC
  Administered 2018-02-18: 4 mg via INTRAVENOUS
  Filled 2018-02-18: qty 2

## 2018-02-18 MED ORDER — ONDANSETRON 8 MG PO TBDP
8.0000 mg | ORAL_TABLET | Freq: Once | ORAL | Status: DC
  Filled 2018-02-18: qty 1

## 2018-02-18 MED ORDER — OXYCODONE-ACETAMINOPHEN 5-325 MG PO TABS
1.0000 | ORAL_TABLET | Freq: Once | ORAL | Status: AC
  Administered 2018-02-18: 1 via ORAL

## 2018-02-18 MED ORDER — OXYCODONE-ACETAMINOPHEN 5-325 MG PO TABS
ORAL_TABLET | ORAL | Status: AC
  Administered 2018-02-18: 1 via ORAL
  Filled 2018-02-18: qty 1

## 2018-02-18 MED ORDER — OXYCODONE-ACETAMINOPHEN 5-325 MG PO TABS: 1.0000 | ORAL_TABLET | Freq: Four times a day (QID) | ORAL | 0 refills | Status: DC | PRN

## 2018-02-18 MED ORDER — MORPHINE SULFATE (PF) 4 MG/ML IV SOLN
4.0000 mg | Freq: Once | INTRAVENOUS | Status: AC
  Administered 2018-02-18: 4 mg via INTRAVENOUS
  Filled 2018-02-18: qty 1

## 2018-02-18 MED ORDER — OXYCODONE-ACETAMINOPHEN 5-325 MG PO TABS
1.0000 | ORAL_TABLET | Freq: Once | ORAL | Status: DC
  Filled 2018-02-18: qty 1

## 2018-02-18 NOTE — ED Notes (Signed)
Pt had difficulty with crutches but no walkers available to send home with pt. Pt continued to work with crutches and reports feeling safe enough to go home for the night and return tomorrow. Pt has a ride home.

## 2018-02-18 NOTE — ED Triage Notes (Signed)
Presents via ems   S/p fall  States her left leg gave out and she rolled her ankle  Swelling noted  Good pulses  Hx of MS  And has weakness to that leg

## 2018-02-18 NOTE — ED Provider Notes (Signed)
[facility] Emergency Department Provider Note  ____________________________________________  Time seen: Approximately 4:48 PM  I have reviewed the triage vital signs and the nursing notes.   HISTORY  Chief Complaint Fall and Ankle Pain    HPI Leslie Smith is a 67 y.o. female who presents the emergency department complaining of left ankle injury.  Patient reports that she has a history of MS, occasionally has her legs give out underneath her.  Patient was coming down a flight of stairs, was at the last step when she states that she landed awkwardly on her ankle.  Patient states that she has limited sensation at baseline, but does have a throbbing and pain to the medial aspect of her ankle.  Patient is unable to bear weight on same.  She denied her head or lose consciousness.  No medications for this complaint prior to arrival.  Patient did arrive via EMS and they did not administer any medications.    Past Medical History:  Diagnosis Date  . 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[place]) 09/15/2014  . Complication of anesthesia   . Hypertension   . MS (multiple sclerosis) ([place])   . PONV (postoperative nausea and vomiting)     Patient Active Problem List   Diagnosis Date Noted  . DJD (degenerative joint disease) of knee 01/02/2017  . Advanced care planning/counseling discussion 01/02/2017  . Thrombocytopenia ([place]) 10/30/2015  . Multiple sclerosis ([place])   . Essential hypertension   . Hyperlipidemia     Past Surgical History:  Procedure Laterality Date  . ABDOMINAL HYSTERECTOMY    . BRAIN SURGERY  Nov. 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 BREAST SURGERY Left    biopsy  . I&D EXTREMITY Left 08/25/2015   Procedure: IRRIGATION AND DEBRIDEMENT THUMB;  Surgeon: Iran Planas, MD;  Location: [place];  Service: Orthopedics;  Laterality: Left;    Prior to Admission medications   Medication Sig Start Date End Date Taking? Authorizing Provider  ibuprofen (IBU) 800 MG tablet  TAKE ONE TABLET BY MOUTH EVERY 8 HOURS AS NEEDED FOR MILD OR MODERATEPAIN 12/24/17   Guadalupe Maple, MD  losartan-hydrochlorothiazide (HYZAAR) 100-25 MG tablet Take 1 tablet by mouth daily. 12/24/17   Guadalupe Maple, MD  oxyCODONE-acetaminophen (PERCOCET/ROXICET) 5-325 MG tablet Take 1 tablet by mouth every 6 (six) hours as needed for severe pain. 02/18/18   Wilburta Milbourn, Charline Bills, PA-C    Allergies Atorvastatin; Meloxicam; and Watermelon [citrullus vulgaris]  Family History  Problem Relation Age of Onset  . Heart disease Father   . Cancer Father     Social History Social History   Tobacco Use  . Smoking status: Never Smoker  . Smokeless tobacco: Never Used  Substance Use Topics  . Alcohol use: Yes    Comment: occassional  . Drug use: Yes    Types: Marijuana     Review of Systems  Constitutional: No fever/chills Eyes: No visual changes. Cardiovascular: no chest pain. Respiratory: no cough. No SOB. Gastrointestinal: No abdominal pain.  No nausea, no vomiting.  Musculoskeletal: Positive for left ankle and foot injury. Skin: Negative for rash, abrasions, lacerations, ecchymosis. Neurological: Negative for headaches, focal weakness or numbness. 10-point ROS otherwise negative.  ____________________________________________   PHYSICAL EXAM:  VITAL SIGNS: ED Triage Vitals [02/18/18 1629]  Enc Vitals Group     BP (!) 149/51     Pulse Rate 76     Resp 18     Temp 98.2 F (36.8 C)  Temp Source Oral     SpO2 96 %     Weight 300 lb (136.1 kg)     Height 5\' 7"  (1.702 m)     Head Circumference      Peak Flow      Pain Score 6     Pain Loc      Pain Edu?      Excl. in [place]?      Constitutional: Alert and oriented. Well appearing and in no acute distress. Eyes: Conjunctivae are normal. PERRL. EOMI. Head: Atraumatic. Neck: No stridor.    Cardiovascular: Normal rate, regular rhythm. Normal S1 and S2.  Good peripheral circulation. Respiratory: Normal respiratory  effort without tachypnea or retractions. Lungs CTAB. Good air entry to the bases with no decreased or absent breath sounds. Musculoskeletal: Full range of motion to all extremities. No gross deformities appreciated.  Utilization of the left ankle reveals mild abrasion to the anterior aspect.  Mild ecchymosis globally around the ankle.  No gross deformity.  Patient has bilateral lower extremity edema, equal bilaterally.  Dorsalis pedis pulse intact and marked.  Sensation intact all 5 digits. Neurologic:  Normal speech and language. No gross focal neurologic deficits are appreciated.  Skin:  Skin is warm, dry and intact. No rash noted. Psychiatric: Mood and affect are normal. Speech and behavior are normal. Patient exhibits appropriate insight and judgement.   ____________________________________________   LABS (all labs ordered are listed, but only abnormal results are displayed)  Labs Reviewed - No data to display ____________________________________________  EKG   ____________________________________________  RADIOLOGY I personally viewed and evaluated these images as part of my medical decision making, as well as reviewing the written report by the radiologist.  I concur with radiologist finding of trimalleolar fracture with widening medial more T's.  Dg Ankle Complete Left  Result Date: 02/18/2018 CLINICAL DATA:  Fall with ankle injury EXAM: LEFT ANKLE COMPLETE - 3+ VIEW COMPARISON:  None. FINDINGS: Acute displaced medial malleolar fracture with widening of the medial mortise. Acute fracture distal metaphysis of the fibula with about 1/4 bone with of lateral displacement and posterior displacement of distal fracture fragment, fracture lucency extends to the superolateral ankle joint. Probable posterior malleolar fracture as well. IMPRESSION: 1. Acute displaced medial malleolar and distal fibular fractures with widening of the superior medial mortise. 2. Possible posterior malleolar  fracture. Electronically Signed   By: Donavan Foil M.D.   On: 02/18/2018 17:55   Dg Foot Complete Left  Result Date: 02/18/2018 CLINICAL DATA:  Fall with ankle injury EXAM: LEFT FOOT - COMPLETE 3+ VIEW COMPARISON:  None. FINDINGS: Medial malleolar and distal fibular fractures. No fracture within the bones of the foot. Small plantar calcaneal spur. Moderate posterior enthesophyte. IMPRESSION: 1. No acute osseous abnormality in the foot bones 2. Acute medial malleolar and distal fibular fractures Electronically Signed   By: Donavan Foil M.D.   On: 02/18/2018 17:56    ____________________________________________    PROCEDURES  Procedure(s) performed:    Procedures    Medications  morphine 4 MG/ML injection 4 mg (4 mg Intravenous Given 02/18/18 1729)  ondansetron (ZOFRAN) injection 4 mg (4 mg Intravenous Given 02/18/18 1729)  oxyCODONE-acetaminophen (PERCOCET/ROXICET) 5-325 MG per tablet 1 tablet (1 tablet Oral Given 02/18/18 2003)     ____________________________________________   INITIAL IMPRESSION / ASSESSMENT AND PLAN / ED COURSE  Pertinent labs & imaging results that were available during my care of the patient were reviewed by me and considered in my medical decision  making (see chart for details).  Review of the [place] CSRS was performed in accordance of the [place] prior to dispensing any controlled drugs.      Patient's diagnosis is consistent with closed trimalleolar fracture, thrombocytopenia, difficulty with anesthesia.  Patient presents emergency department after a fall.  Patient has MS, frequently has an extremity "give out".  This occurred while at the bottom of the stairway.  Patient presented with injury to the left ankle.  X-ray reveals trimalleolar fracture but no dislocation of the talar dome.  I discussed the case with on-call orthopedic surgeon as patient has a history of thrombocytopenia as well as difficulty with anesthesia.  Patient is to follow-up tomorrow morning  with surgery, and orthopedics will coordinate with hematology/oncology and neurology whether patient is a suitable candidate for surgery here, or whether she needs a tertiary [facility].  Prescription for Percocet will be provided to patient for pain.  She verbalizes understanding of need to present to the orthopedic office tomorrow morning at 9 AM.  Patient will be discharged home with prescriptions for Percocet. Patient is to follow up with orthopedics tomorrow morning as directed, primary care as needed or otherwise directed. Patient is given ED precautions to return to the ED for any worsening or new symptoms.     ____________________________________________  FINAL CLINICAL IMPRESSION(S) / ED DIAGNOSES  Final diagnoses:  Closed trimalleolar fracture of left ankle, initial encounter  Thrombocytopenia ([place])      NEW MEDICATIONS STARTED DURING THIS VISIT:  ED Discharge Orders         Ordered    oxyCODONE-acetaminophen (PERCOCET/ROXICET) 5-325 MG tablet  Every 6 hours PRN     02/18/18 1942              This chart was dictated using voice recognition software/Dragon. Despite best efforts to proofread, errors can occur which can change the meaning. Any change was purely unintentional.    Brynda Peon 02/18/18 2004    Merlyn Lot, MD 02/18/18 2013

## 2018-02-19 DIAGNOSIS — S82852A Displaced trimalleolar fracture of left lower leg, initial encounter for closed fracture: Secondary | ICD-10-CM | POA: Diagnosis not present

## 2018-02-20 ENCOUNTER — Ambulatory Visit
Admission: RE | Admit: 2018-02-20 | Discharge: 2018-02-21 | Disposition: A | Discharge status: Home Health | Payer: Medicare Other | Source: Ambulatory Visit | Attending: Orthopedic Surgery | Admitting: Orthopedic Surgery

## 2018-02-20 ENCOUNTER — Ambulatory Visit: Payer: Medicare Other | Admitting: Anesthesiology

## 2018-02-20 ENCOUNTER — Inpatient Hospital Stay: Admission: RE | Admit: 2018-02-20 | Payer: Medicare Other | Source: Ambulatory Visit

## 2018-02-20 ENCOUNTER — Ambulatory Visit: Payer: Medicare Other

## 2018-02-20 ENCOUNTER — Other Ambulatory Visit: Payer: Self-pay

## 2018-02-20 ENCOUNTER — Encounter: Payer: Self-pay | Admitting: *Deleted

## 2018-02-20 ENCOUNTER — Encounter
Admission: RE | Disposition: A | Discharge status: Home Health | Payer: Self-pay | Source: Ambulatory Visit | Attending: Orthopedic Surgery

## 2018-02-20 DIAGNOSIS — R2681 Unsteadiness on feet: Secondary | ICD-10-CM | POA: Insufficient documentation

## 2018-02-20 DIAGNOSIS — E785 Hyperlipidemia, unspecified: Secondary | ICD-10-CM | POA: Diagnosis not present

## 2018-02-20 DIAGNOSIS — W109XXA Fall (on) (from) unspecified stairs and steps, initial encounter: Secondary | ICD-10-CM | POA: Insufficient documentation

## 2018-02-20 DIAGNOSIS — Z9889 Other specified postprocedural states: Secondary | ICD-10-CM

## 2018-02-20 DIAGNOSIS — Z23 Encounter for immunization: Secondary | ICD-10-CM | POA: Insufficient documentation

## 2018-02-20 DIAGNOSIS — Z79899 Other long term (current) drug therapy: Secondary | ICD-10-CM | POA: Insufficient documentation

## 2018-02-20 DIAGNOSIS — Z86011 Personal history of benign neoplasm of the brain: Secondary | ICD-10-CM | POA: Insufficient documentation

## 2018-02-20 DIAGNOSIS — Z791 Long term (current) use of non-steroidal anti-inflammatories (NSAID): Secondary | ICD-10-CM | POA: Diagnosis not present

## 2018-02-20 DIAGNOSIS — S82832A Other fracture of upper and lower end of left fibula, initial encounter for closed fracture: Secondary | ICD-10-CM | POA: Diagnosis not present

## 2018-02-20 DIAGNOSIS — S82852A Displaced trimalleolar fracture of left lower leg, initial encounter for closed fracture: Secondary | ICD-10-CM | POA: Diagnosis not present

## 2018-02-20 DIAGNOSIS — Z79891 Long term (current) use of opiate analgesic: Secondary | ICD-10-CM | POA: Diagnosis not present

## 2018-02-20 DIAGNOSIS — G35 Multiple sclerosis: Secondary | ICD-10-CM | POA: Insufficient documentation

## 2018-02-20 DIAGNOSIS — D696 Thrombocytopenia, unspecified: Secondary | ICD-10-CM | POA: Diagnosis not present

## 2018-02-20 DIAGNOSIS — Z8781 Personal history of (healed) traumatic fracture: Secondary | ICD-10-CM

## 2018-02-20 DIAGNOSIS — I10 Essential (primary) hypertension: Secondary | ICD-10-CM | POA: Insufficient documentation

## 2018-02-20 DIAGNOSIS — S8252XA Displaced fracture of medial malleolus of left tibia, initial encounter for closed fracture: Secondary | ICD-10-CM | POA: Diagnosis not present

## 2018-02-20 HISTORY — DX: Malignant (primary) neoplasm, unspecified: C80.1

## 2018-02-20 HISTORY — DX: Thrombocytopenia, unspecified: D69.6

## 2018-02-20 HISTORY — PX: ORIF ANKLE FRACTURE: SHX5408

## 2018-02-20 HISTORY — DX: Cellulitis of unspecified part of limb: L03.119

## 2018-02-20 HISTORY — DX: Personal history of other medical treatment: Z92.89

## 2018-02-20 HISTORY — DX: Myoneural disorder, unspecified: G70.9

## 2018-02-20 HISTORY — DX: Cutaneous abscess of limb, unspecified: L02.419

## 2018-02-20 HISTORY — DX: Other fatigue: R53.83

## 2018-02-20 HISTORY — DX: Unspecified fall, initial encounter: W19.XXXA

## 2018-02-20 HISTORY — DX: Myelitis, unspecified: G04.91

## 2018-02-20 HISTORY — DX: Unilateral primary osteoarthritis, unspecified knee: M17.10

## 2018-02-20 LAB — CBC WITH DIFFERENTIAL/PLATELET
Basophils Absolute: 0.1 10*3/uL (ref 0–0.1)
Basophils Relative: 1 %
EOS ABS: 0.1 10*3/uL (ref 0–0.7)
EOS PCT: 2 %
HCT: 34.7 % — ABNORMAL LOW (ref 35.0–47.0)
Hemoglobin: 11.9 g/dL — ABNORMAL LOW (ref 12.0–16.0)
Lymphocytes Relative: 12 %
Lymphs Abs: 1.2 10*3/uL (ref 1.0–3.6)
MCH: 30 pg (ref 26.0–34.0)
MCHC: 34.2 g/dL (ref 32.0–36.0)
MCV: 87.7 fL (ref 80.0–100.0)
MONO ABS: 0.5 10*3/uL (ref 0.2–0.9)
MONOS PCT: 5 %
Neutro Abs: 7.8 10*3/uL — ABNORMAL HIGH (ref 1.4–6.5)
Neutrophils Relative %: 80 %
PLATELETS: 55 10*3/uL — AB (ref 150–440)
RBC: 3.96 MIL/uL (ref 3.80–5.20)
RDW: 14.6 % — AB (ref 11.5–14.5)
WBC: 9.7 10*3/uL (ref 3.6–11.0)

## 2018-02-20 LAB — BASIC METABOLIC PANEL
Anion gap: 8 (ref 5–15)
BUN: 14 mg/dL (ref 8–23)
CALCIUM: 8.7 mg/dL — AB (ref 8.9–10.3)
CHLORIDE: 106 mmol/L (ref 98–111)
CO2: 26 mmol/L (ref 22–32)
CREATININE: 0.55 mg/dL (ref 0.44–1.00)
GFR calc Af Amer: >60 mL/min (ref >60)
GFR calc non Af Amer: >60 mL/min (ref >60)
Glucose, Bld: 116 mg/dL — ABNORMAL HIGH (ref 70–99)
Potassium: 3.5 mmol/L (ref 3.5–5.1)
SODIUM: 140 mmol/L (ref 135–145)

## 2018-02-20 SURGERY — OPEN REDUCTION INTERNAL FIXATION (ORIF) ANKLE FRACTURE
Anesthesia: General | Site: Ankle | Laterality: Left

## 2018-02-20 MED ORDER — FENTANYL CITRATE (PF) 100 MCG/2ML IJ SOLN
INTRAMUSCULAR | Status: AC
  Filled 2018-02-20: qty 2

## 2018-02-20 MED ORDER — HYDROCODONE-ACETAMINOPHEN 5-325 MG PO TABS
1.0000 | ORAL_TABLET | ORAL | Status: DC | PRN
  Administered 2018-02-20 – 2018-02-21 (×2): 2 via ORAL
  Filled 2018-02-20 (×3): qty 2

## 2018-02-20 MED ORDER — SUGAMMADEX SODIUM 500 MG/5ML IV SOLN
INTRAVENOUS | Status: DC | PRN
  Administered 2018-02-20: 272.2 mg via INTRAVENOUS

## 2018-02-20 MED ORDER — ONDANSETRON HCL 4 MG/2ML IJ SOLN
4.0000 mg | Freq: Four times a day (QID) | INTRAMUSCULAR | Status: DC | PRN
  Administered 2018-02-20: 4 mg via INTRAVENOUS
  Filled 2018-02-20: qty 2

## 2018-02-20 MED ORDER — FENTANYL CITRATE (PF) 100 MCG/2ML IJ SOLN
25.0000 ug | INTRAMUSCULAR | Status: DC | PRN
  Administered 2018-02-20 (×4): 50 ug via INTRAVENOUS

## 2018-02-20 MED ORDER — METOCLOPRAMIDE HCL 5 MG/ML IJ SOLN: 5.0000 mg | Freq: Three times a day (TID) | INTRAMUSCULAR | Status: DC | PRN

## 2018-02-20 MED ORDER — MORPHINE SULFATE (PF) 4 MG/ML IV SOLN
2.0000 mg | INTRAVENOUS | Status: DC | PRN
  Administered 2018-02-20 (×3): 2 mg via INTRAVENOUS
  Filled 2018-02-20 (×3): qty 1

## 2018-02-20 MED ORDER — DEXTROSE 5 % IV SOLN
3.0000 g | Freq: Once | INTRAVENOUS | Status: AC
  Administered 2018-02-20: 3 g via INTRAVENOUS
  Filled 2018-02-20: qty 3000

## 2018-02-20 MED ORDER — NEOMYCIN-POLYMYXIN B GU 40-200000 IR SOLN
Status: AC
  Filled 2018-02-20: qty 4

## 2018-02-20 MED ORDER — OXYCODONE HCL 5 MG PO TABS: 5.0000 mg | ORAL_TABLET | Freq: Once | ORAL | Status: DC | PRN

## 2018-02-20 MED ORDER — ACETAMINOPHEN 10 MG/ML IV SOLN
INTRAVENOUS | Status: DC | PRN
  Administered 2018-02-20: 1000 mg via INTRAVENOUS

## 2018-02-20 MED ORDER — NEOMYCIN-POLYMYXIN B GU 40-200000 IR SOLN
Status: DC | PRN
  Administered 2018-02-20: 4 mL

## 2018-02-20 MED ORDER — MIDAZOLAM HCL 2 MG/2ML IJ SOLN
INTRAMUSCULAR | Status: DC | PRN
  Administered 2018-02-20: 2 mg via INTRAVENOUS

## 2018-02-20 MED ORDER — DEXAMETHASONE SODIUM PHOSPHATE 10 MG/ML IJ SOLN
INTRAMUSCULAR | Status: DC | PRN
  Administered 2018-02-20: 10 mg via INTRAVENOUS

## 2018-02-20 MED ORDER — PROMETHAZINE HCL 25 MG/ML IJ SOLN: 6.2500 mg | INTRAMUSCULAR | Status: DC | PRN

## 2018-02-20 MED ORDER — ROCURONIUM BROMIDE 100 MG/10ML IV SOLN
INTRAVENOUS | Status: DC | PRN
  Administered 2018-02-20: 50 mg via INTRAVENOUS

## 2018-02-20 MED ORDER — SCOPOLAMINE 1 MG/3DAYS TD PT72
MEDICATED_PATCH | TRANSDERMAL | Status: AC
  Administered 2018-02-20: 1.5 mg via TRANSDERMAL
  Filled 2018-02-20: qty 1

## 2018-02-20 MED ORDER — CHLORHEXIDINE GLUCONATE CLOTH 2 % EX PADS: 6.0000 | MEDICATED_PAD | Freq: Every day | CUTANEOUS | Status: DC

## 2018-02-20 MED ORDER — HYDROMORPHONE HCL 1 MG/ML IJ SOLN
INTRAMUSCULAR | Status: AC
  Filled 2018-02-20: qty 1

## 2018-02-20 MED ORDER — MIDAZOLAM HCL 2 MG/2ML IJ SOLN
INTRAMUSCULAR | Status: AC
  Filled 2018-02-20: qty 2

## 2018-02-20 MED ORDER — FAMOTIDINE 20 MG PO TABS
20.0000 mg | ORAL_TABLET | Freq: Once | ORAL | Status: AC
  Administered 2018-02-20: 20 mg via ORAL

## 2018-02-20 MED ORDER — SODIUM CHLORIDE 0.9 % IV SOLN
INTRAVENOUS | Status: DC
  Administered 2018-02-20: 17:00:00 via INTRAVENOUS

## 2018-02-20 MED ORDER — ONDANSETRON HCL 4 MG PO TABS: 4.0000 mg | ORAL_TABLET | Freq: Four times a day (QID) | ORAL | Status: DC | PRN

## 2018-02-20 MED ORDER — PNEUMOCOCCAL VAC POLYVALENT 25 MCG/0.5ML IJ INJ
0.5000 mL | INJECTION | INTRAMUSCULAR | Status: AC
  Administered 2018-02-21: 0.5 mL via INTRAMUSCULAR
  Filled 2018-02-20: qty 0.5

## 2018-02-20 MED ORDER — ONDANSETRON HCL 4 MG/2ML IJ SOLN
INTRAMUSCULAR | Status: DC | PRN
  Administered 2018-02-20: 4 mg via INTRAVENOUS

## 2018-02-20 MED ORDER — FAMOTIDINE 20 MG PO TABS
ORAL_TABLET | ORAL | Status: AC
  Administered 2018-02-20: 20 mg via ORAL
  Filled 2018-02-20: qty 1

## 2018-02-20 MED ORDER — FENTANYL CITRATE (PF) 100 MCG/2ML IJ SOLN
INTRAMUSCULAR | Status: DC | PRN
  Administered 2018-02-20 (×3): 50 ug via INTRAVENOUS

## 2018-02-20 MED ORDER — SCOPOLAMINE 1 MG/3DAYS TD PT72
1.0000 | MEDICATED_PATCH | TRANSDERMAL | Status: DC
  Administered 2018-02-20: 1.5 mg via TRANSDERMAL

## 2018-02-20 MED ORDER — METOCLOPRAMIDE HCL 10 MG PO TABS: 5.0000 mg | ORAL_TABLET | Freq: Three times a day (TID) | ORAL | Status: DC | PRN

## 2018-02-20 MED ORDER — OXYCODONE HCL 5 MG/5ML PO SOLN: 5.0000 mg | Freq: Once | ORAL | Status: DC | PRN

## 2018-02-20 MED ORDER — ACETAMINOPHEN 10 MG/ML IV SOLN
INTRAVENOUS | Status: AC
  Filled 2018-02-20: qty 100

## 2018-02-20 MED ORDER — HYDROMORPHONE HCL 1 MG/ML IJ SOLN
0.5000 mg | INTRAMUSCULAR | Status: DC | PRN
  Administered 2018-02-20 (×2): 0.5 mg via INTRAVENOUS

## 2018-02-20 MED ORDER — PROPOFOL 10 MG/ML IV BOLUS
INTRAVENOUS | Status: DC | PRN
  Administered 2018-02-20: 150 mg via INTRAVENOUS
  Administered 2018-02-20: 50 mg via INTRAVENOUS

## 2018-02-20 MED ORDER — LACTATED RINGERS IV SOLN
INTRAVENOUS | Status: DC
  Administered 2018-02-20: 14:00:00 via INTRAVENOUS
  Administered 2018-02-20: 100 mL/h via INTRAVENOUS

## 2018-02-20 MED ORDER — PROPOFOL 10 MG/ML IV BOLUS
INTRAVENOUS | Status: AC
  Filled 2018-02-20: qty 20

## 2018-02-20 MED ORDER — DEXTROSE 5 % IV SOLN
3.0000 g | Freq: Three times a day (TID) | INTRAVENOUS | Status: AC
  Administered 2018-02-20 – 2018-02-21 (×3): 3 g via INTRAVENOUS
  Filled 2018-02-20 (×3): qty 3

## 2018-02-20 SURGICAL SUPPLY — 60 items
BANDAGE ACE 4X5 VEL STRL LF (GAUZE/BANDAGES/DRESSINGS) ×6 IMPLANT
BIT DRILL 2.5X2.75 QC CALB (BIT) ×3 IMPLANT
BIT DRILL 2.9 CANN QC NONSTRL (BIT) ×3 IMPLANT
BIT DRILL CALIBRATED 2.7 (BIT) ×2 IMPLANT
BIT DRILL CALIBRATED 2.7MM (BIT) ×1
CANISTER PREVENA 45 (CANNISTER) ×3 IMPLANT
CANISTER SUCT 1200ML W/VALVE (MISCELLANEOUS) ×3 IMPLANT
CHLORAPREP W/TINT 26ML (MISCELLANEOUS) ×3 IMPLANT
CUFF TOURN 24 STER (MISCELLANEOUS) IMPLANT
CUFF TOURN 30 STER DUAL PORT (MISCELLANEOUS) IMPLANT
CUFF TOURN 34 STER (MISCELLANEOUS) ×3 IMPLANT
DRAPE FLUOR MINI C-ARM 54X84 (DRAPES) ×3 IMPLANT
DRAPE INCISE IOBAN 66X45 STRL (DRAPES) ×3 IMPLANT
DRAPE U-SHAPE 47X51 STRL (DRAPES) ×3 IMPLANT
DRSG EMULSION OIL 3X8 NADH (GAUZE/BANDAGES/DRESSINGS) ×3 IMPLANT
ELECT CAUTERY BLADE 6.4 (BLADE) ×3 IMPLANT
ELECT REM PT RETURN 9FT ADLT (ELECTROSURGICAL) ×3
ELECTRODE REM PT RTRN 9FT ADLT (ELECTROSURGICAL) ×1 IMPLANT
GAUZE PETRO XEROFOAM 1X8 (MISCELLANEOUS) ×3 IMPLANT
GAUZE SPONGE 4X4 12PLY STRL (GAUZE/BANDAGES/DRESSINGS) ×3 IMPLANT
GLOVE SURG SYN 9.0  PF PI (GLOVE) ×2
GLOVE SURG SYN 9.0 PF PI (GLOVE) ×1 IMPLANT
GOWN SRG 2XL LVL 4 RGLN SLV (GOWNS) ×1 IMPLANT
GOWN STRL NON-REIN 2XL LVL4 (GOWNS) ×2
GOWN STRL REUS W/ TWL LRG LVL3 (GOWN DISPOSABLE) ×1 IMPLANT
GOWN STRL REUS W/TWL LRG LVL3 (GOWN DISPOSABLE) ×2
HEMOVAC 400ML (MISCELLANEOUS)
K-WIRE ACE 1.6X6 (WIRE) ×6
KIT DRAIN HEMOVAC JP 7FR 400ML (MISCELLANEOUS) IMPLANT
KIT PREVENA INCISION MGT 13 (CANNISTER) ×3 IMPLANT
KIT TURNOVER KIT A (KITS) ×3 IMPLANT
KWIRE ACE 1.6X6 (WIRE) ×2 IMPLANT
LABEL OR SOLS (LABEL) ×3 IMPLANT
NS IRRIG 1000ML POUR BTL (IV SOLUTION) ×3 IMPLANT
PACK EXTREMITY ARMC (MISCELLANEOUS) ×3 IMPLANT
PAD ABD DERMACEA PRESS 5X9 (GAUZE/BANDAGES/DRESSINGS) ×6 IMPLANT
PAD CAST CTTN 4X4 STRL (SOFTGOODS) ×3 IMPLANT
PAD PREP 24X41 OB/GYN DISP (PERSONAL CARE ITEMS) ×3 IMPLANT
PADDING CAST COTTON 4X4 STRL (SOFTGOODS) ×6
PLATE LOCK 6H 77 BILAT FIB (Plate) ×3 IMPLANT
SCALPEL PROTECTED #15 DISP (BLADE) ×6 IMPLANT
SCREW ACE CAN 4.0 36M (Screw) ×6 IMPLANT
SCREW LOCK CORT STAR 3.5X10 (Screw) ×6 IMPLANT
SCREW LOCK CORT STAR 3.5X12 (Screw) ×3 IMPLANT
SCREW LOCK CORT STAR 3.5X14 (Screw) ×3 IMPLANT
SCREW LOW PROFILE 18MMX3.5MM (Screw) ×3 IMPLANT
SCREW NON LOCKING LP 3.5 14MM (Screw) ×6 IMPLANT
SPLINT CAST 1 STEP 3X12 (MISCELLANEOUS) ×6 IMPLANT
SPLINT CAST 1 STEP 5X30 WHT (MISCELLANEOUS) IMPLANT
SPONGE LAP 18X18 RF (DISPOSABLE) ×3 IMPLANT
STAPLER SKIN PROX 35W (STAPLE) ×3 IMPLANT
SUT ETHILON 3-0 FS-10 30 BLK (SUTURE) ×3
SUT MNCRL AB 4-0 PS2 18 (SUTURE) ×6 IMPLANT
SUT VIC AB 0 CT1 36 (SUTURE) ×3 IMPLANT
SUT VIC AB 2-0 SH 27 (SUTURE) ×4
SUT VIC AB 2-0 SH 27XBRD (SUTURE) ×2 IMPLANT
SUT VIC AB 3-0 SH 27 (SUTURE) ×2
SUT VIC AB 3-0 SH 27X BRD (SUTURE) ×1 IMPLANT
SUTURE EHLN 3-0 FS-10 30 BLK (SUTURE) ×1 IMPLANT
SYR 10ML LL (SYRINGE) ×3 IMPLANT

## 2018-02-20 NOTE — Anesthesia Post-op Follow-up Note (Signed)
Anesthesia QCDR form completed.        

## 2018-02-20 NOTE — Op Note (Signed)
02/20/2018  2:34 PM  PATIENT:  Leslie Smith  67 y.o. female  PRE-OPERATIVE DIAGNOSIS:  TRIMALLEOLAR FRACTURE LEFT ANKLE  POST-OPERATIVE DIAGNOSIS:  TRIMALLEOLAR FRACTURE LEFT ANKLE  PROCEDURE:  Procedure(s): OPEN REDUCTION INTERNAL FIXATION (ORIF) ANKLE FRACTURE (Left), medial and lateral malleoli  SURGEON: Laurene Footman, MD  ASSISTANTS: None  ANESTHESIA:   general  EBL:  Total I/O In: 1000 [I.V.:1000] Out: 100 [Blood:100]  BLOOD ADMINISTERED:none  DRAINS: Incisional wound VAC over lateral incision secondary to thrombocytopenia   LOCAL MEDICATIONS USED:  NONE  SPECIMEN:  No Specimen  DISPOSITION OF SPECIMEN:  N/A  COUNTS:  YES  TOURNIQUET: 42 minutes at 300 mmHg  IMPLANTS: Biomet composite locking fibular plate with multiple screws and 236 mm partially-threaded cannulated screws for medial malleolus  DICTATION: .Dragon Dictation patient was brought to the operating room and after adequate anesthesia was obtained the left leg was prepped and draped you sterile fashion with a bump underneath the left buttock to internally rotate the leg and a tourniquet applied the upper thigh.  After prepping and draping the usual sterile fashion appropriate patient identification and timeout procedures were completed.  A lateral approach was made to the fibula with the skin and subcutaneous tissue spread the fracture site was identified with internal rotation and the use of a reduction clamp anatomic alignment of the distal fibula was obtained.  A 6-hole composite plate was then contoured to fit and a nonlocking screw placed first to bring the plate against the fibula then 3 distal locking screws placed followed by nonlocking cortical screws more proximally the initial screw was then changed out to a locking screw to aid in fixation.  The wound was closed after thorough irrigation with 2-0 Vicryl subcutaneously and skin staples.  Going to the medial side anterior medial approach was made in  the malleolus exposed and reduced to parallel K wires were placed and were appropriate appropriately positioned proximal cortex was then drilled in the 36 mm partially-threaded cancellus screws were placed that gave good compression at the fracture site.  This wound was then irrigated and closed again with 2-0 Vicryl subcutaneously and skin staples.  Incisional wound VAC was applied over the lateral incision because of her thrombocytopenia Xeroform over the medial incision as well as an abrasion just anterior medial to this medial incision followed by 4 x 4's web roll and a well-padded splint followed by Ace wraps he was let down at the close of the case.  PLAN OF CARE: Admit for overnight observation  PATIENT DISPOSITION:  PACU - hemodynamically stable.

## 2018-02-20 NOTE — Anesthesia Preprocedure Evaluation (Signed)
Anesthesia Evaluation  Patient identified by MRN, date of birth, ID band Patient awake    Reviewed: Allergy & Precautions, H&P , NPO status , Patient's Chart, lab work & pertinent test results  History of Anesthesia Complications (+) PONV and history of anesthetic complications  Airway Mallampati: III  TM Distance: <3 FB Neck ROM: full    Dental  (+) Chipped, Poor Dentition, Missing, Implants   Pulmonary neg pulmonary ROS, neg shortness of breath,           Cardiovascular Exercise Tolerance: Good hypertension, (-) angina(-) Past MI and (-) DOE      Neuro/Psych  Neuromuscular disease negative psych ROS   GI/Hepatic negative GI ROS, Neg liver ROS, neg GERD  ,  Endo/Other  negative endocrine ROS  Renal/GU      Musculoskeletal  (+) Arthritis ,   Abdominal   Peds  Hematology negative hematology ROS (+)   Anesthesia Other Findings Past Medical History: 09/15/2014: 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[place]) 2014: Cancer ([place])     Comment:  brain tumor, resected 2019: Cellulitis and abscess of leg     Comment:  2 episodes this year No date: Complication of anesthesia 2018: DJD (degenerative joint disease) of knee No date: Fall No date: Fatigue No date: History of blood transfusion No date: Hypertension No date: MS (multiple sclerosis) ([place]) No date: Myelitis ([place]) 2001: Neuromuscular disorder ([place])     Comment:  multiple sclerosis diagnosed by MRI No date: PONV (postoperative nausea and vomiting)     Comment:  severe No date: Thrombocytopenia ([place])  Past Surgical History: 2011: ABDOMINAL HYSTERECTOMY     Comment:  required wound vac for 6 weeks. ovaries had grown               attached to her back bone 2015: BRAIN SURGERY     Comment:  tumor resection-denies seizures 2014: BREAST SURGERY; Left     Comment:  biopsy. negative 08/25/2015: I&D EXTREMITY; Left     Comment:  Procedure: IRRIGATION AND DEBRIDEMENT THUMB;  Surgeon:                Iran Planas, MD;  Location: [place];  Service:               Orthopedics;  Laterality: Left;  BMI    Body Mass Index:  46.99 kg/m      Reproductive/Obstetrics negative OB ROS                             Anesthesia Physical Anesthesia Plan  ASA: III  Anesthesia Plan: General ETT   Post-op Pain Management:    Induction: Intravenous  PONV Risk Score and Plan: Ondansetron, Dexamethasone, Midazolam and Treatment may vary due to age or medical condition  Airway Management Planned: Oral ETT and Video Laryngoscope Planned  Additional Equipment:   Intra-op Plan:   Post-operative Plan: Extubation in OR  Informed Consent: I have reviewed the patients History and Physical, chart, labs and discussed the procedure including the risks, benefits and alternatives for the proposed anesthesia with the patient or authorized representative who has indicated his/her understanding and acceptance.   Dental Advisory Given  Plan Discussed with: Anesthesiologist, CRNA and Surgeon  Anesthesia Plan Comments: (Patient consented for risk of MS flare and voiced understanding  Patient consented for risks of anesthesia including but not limited to:  - adverse reactions to medications - damage to teeth, lips or other oral mucosa - sore  throat or hoarseness - Damage to heart, brain, lungs or loss of life  Patient voiced understanding.)        Anesthesia Quick Evaluation

## 2018-02-20 NOTE — Anesthesia Postprocedure Evaluation (Signed)
Anesthesia Post Note  Patient: Leslie Smith  Procedure(s) Performed: OPEN REDUCTION INTERNAL FIXATION (ORIF) ANKLE FRACTURE (Left Ankle)  Patient location during evaluation: PACU Anesthesia Type: General Level of consciousness: awake and alert Pain management: pain level controlled Vital Signs Assessment: post-procedure vital signs reviewed and stable Respiratory status: spontaneous breathing, nonlabored ventilation, respiratory function stable and patient connected to nasal cannula oxygen Cardiovascular status: blood pressure returned to baseline and stable Postop Assessment: no apparent nausea or vomiting Anesthetic complications: no     Last Vitals:  Vitals:   02/20/18 1500 02/20/18 1505  BP: (!) 123/47   Pulse: 71 73  Resp: 13 12  Temp:    SpO2: 96% 96%    Last Pain:  Vitals:   02/20/18 1505  TempSrc:   PainSc: 10-Worst pain ever                 Precious Haws Jerrett Baldinger

## 2018-02-20 NOTE — Anesthesia Procedure Notes (Signed)
Procedure Name: Intubation Date/Time: 02/20/2018 1:25 PM Performed by: Nelda Marseille, CRNA Pre-anesthesia Checklist: Patient identified, Patient being monitored, Timeout performed, Emergency Drugs available and Suction available Patient Re-evaluated:Patient Re-evaluated prior to induction Oxygen Delivery Method: Circle system utilized Preoxygenation: Pre-oxygenation with 100% oxygen Induction Type: IV induction Ventilation: Mask ventilation without difficulty Laryngoscope Size: Mac, 3 and McGraph Grade View: Grade IV Tube type: Oral Tube size: 7.0 mm Number of attempts: 1 Airway Equipment and Method: Stylet Placement Confirmation: ETT inserted through vocal cords under direct vision,  positive ETCO2 and breath sounds checked- equal and bilateral Secured at: 21 cm Tube secured with: Tape Dental Injury: Teeth and Oropharynx as per pre-operative assessment

## 2018-02-20 NOTE — H&P (Addendum)
Chief Complaint  Patient presents with  . Ankle Pain    Left.    History of the Present Illness: Leslie Smith is a 67 y.o. female here for follow-up after her emergency department visit for a trimalleolar left ankle fracture with slight displacement.  She comes in today for further discussion.  She explains that due to her MS, she lost her footing at the end of a step and fell causing her ankle fracture.    The patient has a history of MS and is followed by neurology at [facility].  I sent a message earlier to see if there is an issue with her having anesthesia.  The patient reports that she has had nausea and vomiting with every surgery she has undergone.  She has had brain surgery in the past when she had more severe nausea and vomiting afterward.    Her labwork shows thrombocytopenia.  She reports she has been taking Betaseron interferon injections for over 10 years which may have caused this although she has been off of the injections for approximately 3 years now.  Her platelet counts have not returned to normal.  I have reviewed past medical, surgical, social and family history, and allergies as documented in the EMR.   Past Medical History: Past Medical History:  Diagnosis Date  . Arthritis   . Cancer (CMS-HCC)   . Encounter for blood transfusion    unknown; ? after hysterectomy  . Hyperlipidemia   . Hypertension   . Multiple sclerosis (CMS-HCC)     Past Surgical History: Past Surgical History:  Procedure Laterality Date  . APPLICATION CRANIAL TONGS CALIPER/STEREOTACTIC FRAME Right 04/29/2013   Procedure: APPLICATION CRANIAL TONGS CALIPER/STEREOTACTIC FRAME;  Surgeon: Ralene Cork, MD;  Location: [facility];  Service: Neurosurgery;  Laterality: Right;  . CRANIOTOMY FOR BRAIN TUMOR RESECTION Right 04/29/2013   Procedure: CRANIOTOMY FOR BRAIN TUMOR RESECTION;  Surgeon: Ralene Cork, MD;  Location: [facility];  Service:  Neurosurgery;  Laterality: Right;  . HARVEST TISSUE Right 04/29/2013   Procedure: HARVEST TISSUE;  Surgeon: Ralene Cork, MD;  Location: [facility];  Service: Neurosurgery;  Laterality: Right;  frontal   . HYSTERECTOMY    . MICROSURGERY Right 04/29/2013   Procedure: MICROSURGERY;  Surgeon: Ralene Cork, MD;  Location: [facility];  Service: Neurosurgery;  Laterality: Right;    Past Family History: FamilyHistory       Family History  Problem Relation Age of Onset  . Coronary Artery Disease (Blocked arteries around heart) Father        fatal MI at 58yo  . Cancer Father        pancreatic       Medications:       Current Outpatient Medications Ordered in Epic  Medication Sig Dispense Refill  . ibuprofen (ADVIL,MOTRIN) 800 MG tablet Take 800 mg by mouth every 6 (six) hours as needed for Pain.    Marland Kitchen losartan-hydrochlorothiazide (HYZAAR) 100-25 mg tablet Take 1 tablet by mouth once daily      . melatonin 3 mg tablet Take 1 tablet (3 mg total) by mouth nightly Take 4-6 hours before bedtime 30 tablet 11  . oxyCODONE-acetaminophen (PERCOCET) 5-325 mg tablet Take 1 tablet by mouth every 6 (six) hours as needed    . ondansetron (ZOFRAN-ODT) 4 MG disintegrating tablet Take 1 tablet (4 mg total) by mouth every 8 (eight) hours as needed for Nausea 30 tablet 0   No current  Epic-ordered facility-administered medications on file.     Allergies:      Allergies  Allergen Reactions  . Watermelon Diarrhea  . Atorvastatin Other (See Comments)    Word finding  . Unable To Assess Other (See Comments)     Body mass index is 49.96 kg/m.   Review of Systems: A comprehensive 14 point ROS was performed, reviewed, and the pertinent orthopaedic findings are documented in the HPI.      Vitals:   02/19/18 0916  BP: 118/72    General Physical Examination:  General/Constitutional: No apparent distress: well-nourished and well  developed. Eyes: Pupils equal, round with synchronous movement. Lungs: Clear to auscultation HEENT: Normal Vascular: No edema, swelling or tenderness, except as noted in detailed exam. Cardiac:  Heart rate and rhythm is regular. Integumentary: No impressive skin lesions present, except as noted in detailed exam. Neuro/Psych: Normal mood and affect, oriented to person, place and time.   Musculoskeletal Examination: On exam, the patient has swelling and ecchymosis of the left ankle.  The exam is brief due to her known fracture.    Lungs are clear.  Heart rate and rhythm is normal.  HEENT is normal.   Radiographs: We reviewed her x-rays from the hospital in detail today that show lateral displacement of the distal fibula fracture and lateral displacement of the medial malleolus.   The posterior malleolus is minimally displaced.   Assessment:   ICD-10-CM ICD-9-CM  1. Closed displaced trimalleolar fracture of left ankle, initial encounter S82.852A 824.6     Plan: We discussed her condition and treatment plan today.  She will be scheduled for open reduction internal fixation of the left ankle medial and lateral malleoli tomorrow on 02/20/2018.  The surgery was discussed in detail today.  We discussed her postoperative course as well and that she may benefit from a knee walker.  She was also prescribed a regular walker as she does not tolerate crutches well due to her MS.   She was electronically prescribed anti-nausea medication and will be prescribed pain medication after surgery.     Surgical Risks:  The nature of the condition and the proposed procedure has been reviewed in detail with the patient.  Surgical versus non-surgical options and prognosis for recovery have been reviewed and the inherent risks and benefits of each have been discussed including the risks of infection, bleeding, injury to nerves / blood vessels / tendons, incomplete relief of symptoms,  persisting pain and / or stiffness, loss of function, complex regional pain syndrome, failure of procedure, as appropriate.  Teeth:  Normal   Scribe Attestation: I, Candie Mile, am acting as scribe for [employer], MD   Reviewed paper H+P, will be scanned into chart. No changes noted.

## 2018-02-20 NOTE — Transfer of Care (Signed)
Immediate Anesthesia Transfer of Care Note  Patient: Leslie Smith  Procedure(s) Performed: OPEN REDUCTION INTERNAL FIXATION (ORIF) ANKLE FRACTURE (Left Ankle)  Patient Location: PACU  Anesthesia Type:General  Level of Consciousness: awake and sedated  Airway & Oxygen Therapy: Patient Spontanous Breathing and Patient connected to face mask oxygen  Post-op Assessment: Report given to RN and Post -op Vital signs reviewed and stable  Post vital signs: Reviewed and stable  Last Vitals:  Vitals Value Taken Time  BP    Temp    Pulse    Resp 12 02/20/2018  2:28 PM  SpO2    Vitals shown include unvalidated device data.  Last Pain:  Vitals:   02/20/18 1130  TempSrc: Oral  PainSc: 8       Patients Stated Pain Goal: 1 (83/25/49 8264)  Complications: No apparent anesthesia complications

## 2018-02-20 NOTE — OR Nursing (Addendum)
Most recent ekg 2 yrs ago, viewed by dr. Amie Critchley.  Determined that patient did not need another ekg today.  Platelet count from today is 55,000. Dr. Amie Critchley and Dr. Rudene Christians both notified and okay to proceed.

## 2018-02-21 DIAGNOSIS — R2681 Unsteadiness on feet: Secondary | ICD-10-CM | POA: Diagnosis not present

## 2018-02-21 DIAGNOSIS — D696 Thrombocytopenia, unspecified: Secondary | ICD-10-CM | POA: Diagnosis not present

## 2018-02-21 DIAGNOSIS — S82852A Displaced trimalleolar fracture of left lower leg, initial encounter for closed fracture: Secondary | ICD-10-CM | POA: Diagnosis not present

## 2018-02-21 DIAGNOSIS — Z23 Encounter for immunization: Secondary | ICD-10-CM | POA: Diagnosis not present

## 2018-02-21 DIAGNOSIS — G35 Multiple sclerosis: Secondary | ICD-10-CM | POA: Diagnosis not present

## 2018-02-21 DIAGNOSIS — I10 Essential (primary) hypertension: Secondary | ICD-10-CM | POA: Diagnosis not present

## 2018-02-21 DIAGNOSIS — M25579 Pain in unspecified ankle and joints of unspecified foot: Secondary | ICD-10-CM | POA: Diagnosis not present

## 2018-02-21 DIAGNOSIS — Z7401 Bed confinement status: Secondary | ICD-10-CM | POA: Diagnosis not present

## 2018-02-21 MED ORDER — OXYCODONE HCL 5 MG PO TABS
5.0000 mg | ORAL_TABLET | ORAL | Status: DC | PRN
  Administered 2018-02-21 (×2): 5 mg via ORAL
  Filled 2018-02-21 (×2): qty 1

## 2018-02-21 MED ORDER — OXYCODONE HCL 5 MG PO TABS: 5.0000 mg | ORAL_TABLET | ORAL | 0 refills | Status: DC | PRN

## 2018-02-21 NOTE — Care Management Note (Addendum)
Case Management Note  Patient Details  Name: Leslie Smith MRN: [id number] Date of Birth: 1950-09-16  Subjective/Objective:  Patient to be discharged per MD order. Orders in place for home health services. Patient is POD1 from ankle surgery. Agreeable to home health services and prefers to use [facility]. Referral placed with Helene Kelp who agrees to accept patient for PT in the home. Patient needs DME rolling walker. Referral for walker placed with [facility]. Awaiting delivery. Family to provide transport. No further needs.  Merrily Pew Rembert Browe RN BSN RNCM [phone]  Update: After PT it was determined patient wold also need and aide to assist with ADL's and would require more equipment. Spoke with [facility] and they will deliver a bariatric walker/BSC. Delivered regular RW in the meantime. Will use EMS since patient has stairs to get into the home and patient is non weight bearing.                     Action/Plan:   Expected Discharge Date:  02/23/18               Expected Discharge Plan:  [place]  In-House Referral:     Discharge planning Services  CM Consult  Post Acute Care Choice:  Durable Medical Equipment, Home Health Choice offered to:     DME Arranged:  Walker rolling DME Agency:  [place]:  PT [facility] Agency:  [facility] ([facility]  Status of Service:  Completed, signed off  If discussed at [employer] of Stay Meetings, dates discussed:    Additional Comments:  Latanya Maudlin, RN 02/21/2018, 8:36 AM

## 2018-02-21 NOTE — Progress Notes (Signed)
  Subjective: 1 Day Post-Op Procedure(s) (LRB): OPEN REDUCTION INTERNAL FIXATION (ORIF) ANKLE FRACTURE (Left) Patient reports pain as moderate.   Patient seen in rounds with Dr. Rudene Christians. Patient is well, and has had no acute complaints or problems Plan is to go Home after hospital stay. Negative for chest pain and shortness of breath Fever: no Gastrointestinal: Negative for nausea and vomiting  Objective: Vital signs in last 24 hours: Temp:  [97.4 F (36.3 C)-98.2 F (36.8 C)] 98 F (36.7 C) (09/14 0020) Pulse Rate:  [63-99] 68 (09/14 0020) Resp:  [12-18] 17 (09/14 0020) BP: (111-146)/(39-68) 135/55 (09/14 0020) SpO2:  [91 %-99 %] 96 % (09/14 0020) Weight:  [136.1 kg] 136.1 kg (09/13 1130)  Intake/Output from previous day:  Intake/Output Summary (Last 24 hours) at 02/21/2018 0628 Last data filed at 02/21/2018 0148 Gross per 24 hour  Intake 1660.45 ml  Output 100 ml  Net 1560.45 ml    Intake/Output this shift: Total I/O In: 360 [P.O.:360] Out: -   Labs: Recent Labs    02/20/18 1123  HGB 11.9*   Recent Labs    02/20/18 1123  WBC 9.7  RBC 3.96  HCT 34.7*  PLT 55*   Recent Labs    02/20/18 1123  NA 140  K 3.5  CL 106  CO2 26  BUN 14  CREATININE 0.55  GLUCOSE 116*  CALCIUM 8.7*   No results for input(s): LABPT, INR in the last 72 hours.   EXAM General - Patient is Alert and Oriented Extremity - Sensation intact distally Dorsiflexion/Plantar flexion intact Dressing/Incision - clean, dry, with the wound VAC intact Motor Function - intact, moving toes well on exam.   Past Medical History:  Diagnosis Date  . 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 Cancer [facility]) 2014   brain tumor, resected  . Cellulitis and abscess of leg 2019   2 episodes this year  . Complication of anesthesia   . DJD (degenerative joint disease) of knee 2018  . Fall   . Fatigue   . History of blood transfusion   . Hypertension   . MS (multiple sclerosis) ([place])   . Myelitis  ([place])   . Neuromuscular disorder ([place]) 2001   multiple sclerosis diagnosed by MRI  . PONV (postoperative nausea and vomiting)    severe  . Thrombocytopenia ([facility])     Assessment/Plan: 1 Day Post-Op Procedure(s) (LRB): OPEN REDUCTION INTERNAL FIXATION (ORIF) ANKLE FRACTURE (Left) Active Problems:   S/P ORIF (open reduction internal fixation) fracture  Estimated body mass index is 46.99 kg/m as calculated from the following:   Height as of this encounter: 5\' 7"  (1.702 m).   Weight as of this encounter: 136.1 kg. Advance diet Up with therapy D/C IV fluids  Discharge home today after physical therapy.  DVT Prophylaxis - Foot Pumps Toe touch weight-Bearing to left leg  Reche Dixon, PA-C Orthopaedic Surgery 02/21/2018, 6:28 AM

## 2018-02-21 NOTE — Plan of Care (Signed)
  Problem: Acute Rehab PT Goals(only PT should resolve) Goal: Pt Will Go Supine/Side To Sit Flowsheets (Taken 02/21/2018 1148) Pt will go Supine/Side to Sit: Independently Note:  To return to home mobility.    Problem: Acute Rehab PT Goals(only PT should resolve) Goal: Patient Will Perform Sitting Balance Flowsheets (Taken 02/21/2018 1148) Patient will perform sitting balance: with modified independence Note:  With RW to return to home mobility.    Problem: Acute Rehab PT Goals(only PT should resolve) Goal: Pt Will Transfer Bed To Chair/Chair To Bed Flowsheets (Taken 02/21/2018 1148) Pt will Transfer Bed to Chair/Chair to Bed: with modified independence Note:  With RW to return to home mobility.    Problem: Acute Rehab PT Goals(only PT should resolve) Goal: Pt Will Ambulate Flowsheets (Taken 02/21/2018 1148) Pt will Ambulate: 25 feet; with modified independence; with rolling walker Note:  To return to home mobility.

## 2018-02-21 NOTE — Discharge Instructions (Signed)
INSTRUCTIONS AFTER Surgery  o Remove items at home which could result in a fall. This includes throw rugs or furniture in walking pathways o ICE to the affected joint every three hours while awake for 30 minutes at a time, for at least the first 3-5 days, and then as needed for pain and swelling.  Continue to use ice for pain and swelling. You may notice swelling that will progress down to the foot and ankle.  This is normal after surgery.  Elevate your leg when you are not up walking on it.   o Continue to use the breathing machine you got in the hospital (incentive spirometer) which will help keep your temperature down.  It is common for your temperature to cycle up and down following surgery, especially at night when you are not up moving around and exerting yourself.  The breathing machine keeps your lungs expanded and your temperature down.   DIET:  As you were doing prior to hospitalization, we recommend a well-balanced diet.  DRESSING / WOUND CARE / SHOWERING  Splint will remain in place including the wound VAC.  The wound VAC will be removed on day 6 at [facility].  No showering or bathing.  ACTIVITY  o Increase activity slowly as tolerated, but follow the weight bearing instructions below.   o No driving for 6 weeks or until further direction given by your physician.  You cannot drive while taking narcotics.  o No lifting or carrying greater than 10 lbs. until further directed by your surgeon. o Avoid periods of inactivity such as sitting longer than an hour when not asleep. This helps prevent blood clots.  o You may return to work once you are authorized by your doctor.     WEIGHT BEARING  Toe-touch weightbearing on the left with a walker   EXERCISES Toe range of motion exercises.  CONSTIPATION  Constipation is defined medically as fewer than three stools per week and severe constipation as less than one stool per week.  Even if you have a regular bowel pattern at home,  your normal regimen is likely to be disrupted due to multiple reasons following surgery.  Combination of anesthesia, postoperative narcotics, change in appetite and fluid intake all can affect your bowels.   YOU MUST use at least one of the following options; they are listed in order of increasing strength to get the job done.  They are all available over the counter, and you may need to use some, POSSIBLY even all of these options:    Drink plenty of fluids (prune juice may be helpful) and high fiber foods Colace 100 mg by mouth twice a day  Senokot for constipation as directed and as needed Dulcolax (bisacodyl), take with full glass of water  Miralax (polyethylene glycol) once or twice a day as needed.  If you have tried all these things and are unable to have a bowel movement in the first 3-4 days after surgery call either your surgeon or your primary doctor.    If you experience loose stools or diarrhea, hold the medications until you stool forms back up.  If your symptoms do not get better within 1 week or if they get worse, check with your doctor.  If you experience "the worst abdominal pain ever" or develop nausea or vomiting, please contact the office immediately for further recommendations for treatment.   ITCHING:  If you experience itching with your medications, try taking only a single pain pill, or even  half a pain pill at a time.  You can also use Benadryl over the counter for itching or also to help with sleep.   TED HOSE STOCKINGS:  Use stockings on both legs until for at least 2 weeks or as directed by physician office. They may be removed at night for sleeping.  MEDICATIONS:  See your medication summary on the After Visit Summary that nursing will review with you.  You may have some home medications which will be placed on hold until you complete the course of blood thinner medication.  It is important for you to complete the blood thinner medication as  prescribed.  PRECAUTIONS:  If you experience chest pain or shortness of breath - call 911 immediately for transfer to the hospital emergency department.   If you develop a fever greater that 101 F, purulent drainage from wound, increased redness or drainage from wound, foul odor from the wound/dressing, or calf pain - CONTACT YOUR SURGEON.                                                   FOLLOW-UP APPOINTMENTS:  If you do not already have a post-op appointment, please call the office for an appointment to be seen by your surgeon.  Guidelines for how soon to be seen are listed in your After Visit Summary, but are typically between 1-4 weeks after surgery.  OTHER INSTRUCTIONS:     MAKE SURE YOU:   Understand these instructions.   Get help right away if you are not doing well or get worse.    Thank you for letting us be a part of your medical care team.  It is a privilege we respect greatly.  We hope these instructions will help you stay on track for a fast and full recovery!

## 2018-02-21 NOTE — Plan of Care (Signed)
  Problem: Education: Goal: Knowledge of General Education information will improve Description Including pain rating scale, medication(s)/side effects and non-pharmacologic comfort measures Outcome: Progressing   

## 2018-02-21 NOTE — Evaluation (Signed)
Physical Therapy Evaluation Patient Details Name: Leslie Smith MRN: [id number] DOB: [birth year] Today's Date: 02/21/2018   History of Present Illness  Patient is a pleasant 67 y/o female that presents s/p ORIF L tibia/fibular fracture. She has a history of MS and fell going down stairs while at a friends house.   Clinical Impression  Patient is a pleasant 67 y/o female with history of MS that sustained a fall on stairs and suffered a tri-malleolar L ankle fracture. She underwent ORIF and is now TTWBing on her LLE. She was previously independent, but has 3 steps to enter and exit the home. She is able to live on the first floor with bed/bathroom, but does not have regular assistance in the home. She is able to transfer from supine to sit independently, and was able to stand from elevated surface with modified independence. Patient was educated on proper technique with hopping on RLE, which she was able to complete for short distance without loss of balance. She does have a history of R knee pain due to "arthritis" and will likely be limited in her household mobility for the next 6 weeks while she remains TTWBing on her LLE. Discussed having a ramp built for patient and use of EMS transport with stretcher to get in/out of the house until a ramp is built as given her body habitus hopping/scooting up and down the stairs is not feasible. Patient would likely benefit from skilled PT services to improve her safety and household mobility.     Follow Up Recommendations SNF;Supervision/Assistance - 24 hour(Per discussion with MD patient will have to go home after this admission, she would benefit from [place], [place], RN, RN aide.)    Equipment Recommendations  Wheelchair (measurements PT);3in1 (PT);Rolling walker with 5" wheels  Patient suffers from L ankle fracture which impairs her ability to perform daily activities like toileting, feeding, dressing, grooming, bathing in the home. A :cane, walker, or crutches  will not resolve the patient's issue with performing activities of daily living. A wheelchair is required/recommended and will allow patient to safely perform daily activities.   Patient can safely propel the wheelchair in the home or has a caregiver who can provide assistance.     Recommendations for Other Services       Precautions / Restrictions Restrictions Weight Bearing Restrictions: Yes LLE Weight Bearing: Touchdown weight bearing      Mobility  Bed Mobility Overal bed mobility: Independent             General bed mobility comments: Patient is able to transfer to EOB from supine with no assistance.   Transfers Overall transfer level: Needs assistance Equipment used: Rolling walker (2 wheeled) Transfers: Sit to/from Stand Sit to Stand: Min guard         General transfer comment: Patient is able to transfer from elevated bed surface with cuing for hand placement on RW.   Ambulation/Gait Ambulation/Gait assistance: Min assist Gait Distance (Feet): 3 Feet Assistive device: Rolling walker (2 wheeled) Gait Pattern/deviations: Step-to pattern     General Gait Details: Patient is able to take hops to bedside chair with cuing for turning through RW and maintaining TTWB on LLE only.   Stairs            Wheelchair Mobility    Modified Rankin (Stroke Patients Only)       Balance Overall balance assessment: Needs assistance;History of Falls Sitting-balance support: No upper extremity supported Sitting balance-Leahy Scale: Good     Standing balance  support: Bilateral upper extremity supported Standing balance-Leahy Scale: Fair                               Pertinent Vitals/Pain Pain Assessment: Faces Faces Pain Scale: Hurts little more Pain Location: L ankle  Pain Descriptors / Indicators: Aching Pain Intervention(s): Limited activity within patient's tolerance;Repositioned    Home Living Family/patient expects to be discharged to::  Private residence Living Arrangements: Alone(Will have a friend check on her every night) Available Help at Discharge: Friend(s);Available PRN/intermittently Type of Home: House Home Access: Stairs to enter Entrance Stairs-Rails: Can reach both Entrance Stairs-Number of Steps: 3 Home Layout: Two level;Able to live on main level with bedroom/bathroom Home Equipment: Gilford Rile - 2 wheels;Cane - single point;Grab bars - tub/shower      Prior Function Level of Independence: Independent with assistive device(s)         Comments: Patient reports she was independent with use of RW/SPC intermittently when her R knee would bother her prior to this injury.      Hand Dominance        Extremity/Trunk Assessment   Upper Extremity Assessment Upper Extremity Assessment: Overall WFL for tasks assessed    Lower Extremity Assessment Lower Extremity Assessment: LLE deficits/detail LLE Deficits / Details: She is able to hold her LLE elevated while hopping and turning.        Communication   Communication: No difficulties  Cognition Arousal/Alertness: Awake/alert Behavior During Therapy: WFL for tasks assessed/performed Overall Cognitive Status: Within Functional Limits for tasks assessed                                        General Comments General comments (skin integrity, edema, etc.): Ankle in wrapping appropriately.     Exercises Other Exercises Other Exercises: Discussed home entry and exit options given her injury, how EMS will transport home via stretcher and that having a ramp built would make home entry/exit much easier and more managable for patient.  Other Exercises: Discussed placement of BSC near her sitting chair, WBing status, and use of wheelchair use around the house for primary mobility.  Other Exercises: Educated and observed patient transfer from bedside chair with +1 min-mod A due to low position, and cga x 1 for 12' of hopping with RW while  maintaining NWBing status on LLE.    Assessment/Plan    PT Assessment Patient needs continued PT services  PT Problem List Decreased strength;Decreased mobility;Decreased safety awareness;Decreased range of motion;Decreased coordination;Decreased knowledge of precautions;Obesity;Decreased activity tolerance;Pain;Decreased knowledge of use of DME;Decreased balance       PT Treatment Interventions DME instruction;Therapeutic activities;Gait training;Therapeutic exercise;Patient/family education;Stair training;Balance training;Neuromuscular re-education;Functional mobility training;Wheelchair mobility training    PT Goals (Current goals can be found in the Care Plan section)  Acute Rehab PT Goals Patient Stated Goal: To go back to church PT Goal Formulation: With patient Time For Goal Achievement: 03/07/18 Potential to Achieve Goals: Good    Frequency BID   Barriers to discharge Decreased caregiver support;Inaccessible home environment Patient has 3 steps to enter and exit, discussed building a ramp once she is inside the home.     Co-evaluation               AM-PAC PT "6 Clicks" Daily Activity  Outcome Measure Difficulty turning over in bed (including adjusting bedclothes, sheets and  blankets)?: None Difficulty moving from lying on back to sitting on the side of the bed? : None Difficulty sitting down on and standing up from a chair with arms (e.g., wheelchair, bedside commode, etc,.)?: A Little Help needed moving to and from a bed to chair (including a wheelchair)?: A Little Help needed walking in hospital room?: A Lot Help needed climbing 3-5 steps with a railing? : Total 6 Click Score: 17    End of Session Equipment Utilized During Treatment: Gait belt Activity Tolerance: Patient tolerated treatment well Patient left: with call bell/phone within reach;in chair;with chair alarm set Nurse Communication: Mobility status;Weight bearing status;Other (comment)(Discussed case  with CM team, will have EMS transport home) PT Visit Diagnosis: Unsteadiness on feet (R26.81);Muscle weakness (generalized) (M62.81);History of falling (Z91.81)    Time: 2010-0712 PT Time Calculation (min) (ACUTE ONLY): 51 min   Charges:   PT Evaluation $PT Eval Moderate Complexity: 1 Mod PT Treatments $Gait Training: 23-37 mins       Royce Macadamia PT, DPT, CSCS    02/21/2018, 12:00 PM

## 2018-02-21 NOTE — Discharge Summary (Signed)
Physician Discharge Summary  Subjective: 1 Day Post-Op Procedure(s) (LRB): OPEN REDUCTION INTERNAL FIXATION (ORIF) ANKLE FRACTURE (Left) Patient reports pain as moderate.   Patient seen in rounds with Dr. Rudene Christians. Patient is well, and has had no acute complaints or problems Patient is ready to go home after physical therapy today.  The patient has stable vitals.  Her pain management is slowly becoming more controlled.  She has a wound VAC that is intact.  Physician Discharge Summary  Patient ID: Leslie Smith MRN: [id number] DOB/AGE: September 10, 1950 67 y.o.  Admit date: 02/20/2018 Discharge date: 02/21/2018  Admission Diagnoses:  Discharge Diagnoses:  Active Problems:   S/P ORIF (open reduction internal fixation) fracture   Discharged Condition: fair  Hospital Course: The patient is postop day 1 from a left ankle trimalleolar fracture with ORIF.  She is slowly getting her pain under control.  The patient has a wound VAC that is in place and is being managed.  She is ready to go home after physical therapy  Treatments: surgery:  OPEN REDUCTION INTERNAL FIXATION (ORIF) ANKLE FRACTURE (Left), medial and lateral malleoli  SURGEON: Laurene Footman, MD  ASSISTANTS: None  ANESTHESIA:   general  EBL:  Total I/O In: 1000 [I.V.:1000] Out: 100 [Blood:100]  BLOOD ADMINISTERED:none  DRAINS: Incisional wound VAC over lateral incision secondary to thrombocytopenia   LOCAL MEDICATIONS USED:  NONE  SPECIMEN:  No Specimen  DISPOSITION OF SPECIMEN:  N/A  COUNTS:  YES  TOURNIQUET: 42 minutes at 300 mmHg  IMPLANTS: Biomet composite locking fibular plate with multiple screws and 236 mm partially-threaded cannulated screws for medial malleolus  Discharge Exam: Blood pressure (!) 135/55, pulse 68, temperature 98 F (36.7 C), temperature source Oral, resp. rate 17, height 5\' 7"  (1.702 m), weight 136.1 kg, SpO2 96 %.   Disposition: Discharge disposition: 01-Home or Self  Care        Allergies as of 02/21/2018      Reactions   Watermelon [citrullus Vulgaris] Diarrhea   Severe diarrhea within 2 minutes of eating   Atorvastatin Other (See Comments)   Word finding.  Took only for 6 months. Also did not take due to MS   Meloxicam Nausea Only      Medication List    TAKE these medications   cholecalciferol 1000 units tablet Commonly known as:  VITAMIN D Take 2,000 Units by mouth daily.   ibuprofen 800 MG tablet Commonly known as:  ADVIL,MOTRIN TAKE ONE TABLET BY MOUTH EVERY 8 HOURS AS NEEDED FOR MILD OR MODERATEPAIN   losartan-hydrochlorothiazide 100-25 MG tablet Commonly known as:  HYZAAR Take 1 tablet by mouth daily.   oxyCODONE 5 MG immediate release tablet Commonly known as:  Oxy IR/ROXICODONE Take 1 tablet (5 mg total) by mouth every 4 (four) hours as needed for moderate pain.   oxyCODONE-acetaminophen 5-325 MG tablet Commonly known as:  PERCOCET/ROXICET Take 1 tablet by mouth every 6 (six) hours as needed for severe pain.      Follow-up Information    Hessie Knows, MD On 02/27/2018.   Specialty:  Orthopedic Surgery Contact information: [place] [address] [phone]           Signed: Prescott Parma, Bernadean Saling 02/21/2018, 6:38 AM   Objective: Vital signs in last 24 hours: Temp:  [97.4 F (36.3 C)-98.2 F (36.8 C)] 98 F (36.7 C) (09/14 0020) Pulse Rate:  [63-99] 68 (09/14 0020) Resp:  [12-18] 17 (09/14 0020) BP: (111-146)/(39-68) 135/55 (09/14 0020)  SpO2:  [91 %-99 %] 96 % (09/14 0020) Weight:  [136.1 kg] 136.1 kg (09/13 1130)  Intake/Output from previous day:  Intake/Output Summary (Last 24 hours) at 02/21/2018 0638 Last data filed at 02/21/2018 0148 Gross per 24 hour  Intake 1660.45 ml  Output 100 ml  Net 1560.45 ml    Intake/Output this shift: Total I/O In: 360 [P.O.:360] Out: -   Labs: Recent Labs    02/20/18 1123  HGB 11.9*   Recent Labs     02/20/18 1123  WBC 9.7  RBC 3.96  HCT 34.7*  PLT 55*   Recent Labs    02/20/18 1123  NA 140  K 3.5  CL 106  CO2 26  BUN 14  CREATININE 0.55  GLUCOSE 116*  CALCIUM 8.7*   No results for input(s): LABPT, INR in the last 72 hours.  EXAM: General - Patient is Alert and Oriented Extremity - Sensation intact distally Dorsiflexion/Plantar flexion intact Incision - clean, dry, with a wound VAC in place Motor Function -dorsiflexion and plantarflexion of toes intact  Assessment/Plan: 1 Day Post-Op Procedure(s) (LRB): OPEN REDUCTION INTERNAL FIXATION (ORIF) ANKLE FRACTURE (Left) Procedure(s) (LRB): OPEN REDUCTION INTERNAL FIXATION (ORIF) ANKLE FRACTURE (Left) Past Medical History:  Diagnosis Date  . 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[place]) 09/15/2014  . Cancer [facility]) 2014   brain tumor, resected  . Cellulitis and abscess of leg 2019   2 episodes this year  . Complication of anesthesia   . DJD (degenerative joint disease) of knee 2018  . Fall   . Fatigue   . History of blood transfusion   . Hypertension   . MS (multiple sclerosis) ([place])   . Myelitis ([place])   . Neuromuscular disorder ([place]) 2001   multiple sclerosis diagnosed by MRI  . PONV (postoperative nausea and vomiting)    severe  . Thrombocytopenia (HCC)    Active Problems:   S/P ORIF (open reduction internal fixation) fracture  Estimated body mass index is 46.99 kg/m as calculated from the following:   Height as of this encounter: 5\' 7"  (1.702 m).   Weight as of this encounter: 136.1 kg. Advance diet Up with therapy D/C IV fluids  Discharge home today Diet - Regular diet Follow up - in 6 days Activity -toe-touch weightbearing on the left Disposition - Home Condition Upon Discharge - Stable DVT Prophylaxis - None  Reche Dixon, PA-C Orthopaedic Surgery 02/21/2018, 6:38 AM

## 2018-02-21 NOTE — Progress Notes (Addendum)
Discharge instructions and prescription given to pt. IV removed. No questions from pt at this time.

## 2018-02-22 DIAGNOSIS — Z9181 History of falling: Secondary | ICD-10-CM | POA: Diagnosis not present

## 2018-02-22 DIAGNOSIS — S82892D Other fracture of left lower leg, subsequent encounter for closed fracture with routine healing: Secondary | ICD-10-CM | POA: Diagnosis not present

## 2018-02-23 ENCOUNTER — Encounter: Payer: Self-pay | Admitting: Orthopedic Surgery

## 2018-02-26 DIAGNOSIS — Z9181 History of falling: Secondary | ICD-10-CM | POA: Diagnosis not present

## 2018-02-26 DIAGNOSIS — S82892D Other fracture of left lower leg, subsequent encounter for closed fracture with routine healing: Secondary | ICD-10-CM | POA: Diagnosis not present

## 2018-02-27 DIAGNOSIS — S82852A Displaced trimalleolar fracture of left lower leg, initial encounter for closed fracture: Secondary | ICD-10-CM | POA: Diagnosis not present

## 2018-02-27 DIAGNOSIS — Z743 Need for continuous supervision: Secondary | ICD-10-CM | POA: Diagnosis not present

## 2018-02-27 DIAGNOSIS — M25572 Pain in left ankle and joints of left foot: Secondary | ICD-10-CM | POA: Diagnosis not present

## 2018-03-03 DIAGNOSIS — S82892D Other fracture of left lower leg, subsequent encounter for closed fracture with routine healing: Secondary | ICD-10-CM | POA: Diagnosis not present

## 2018-03-03 DIAGNOSIS — Z9181 History of falling: Secondary | ICD-10-CM | POA: Diagnosis not present

## 2018-03-04 DIAGNOSIS — Z9181 History of falling: Secondary | ICD-10-CM | POA: Diagnosis not present

## 2018-03-04 DIAGNOSIS — S82892D Other fracture of left lower leg, subsequent encounter for closed fracture with routine healing: Secondary | ICD-10-CM | POA: Diagnosis not present

## 2018-03-05 DIAGNOSIS — S82892D Other fracture of left lower leg, subsequent encounter for closed fracture with routine healing: Secondary | ICD-10-CM | POA: Diagnosis not present

## 2018-03-05 DIAGNOSIS — Z9181 History of falling: Secondary | ICD-10-CM | POA: Diagnosis not present

## 2018-03-06 DIAGNOSIS — S82852A Displaced trimalleolar fracture of left lower leg, initial encounter for closed fracture: Secondary | ICD-10-CM | POA: Diagnosis not present

## 2018-03-06 DIAGNOSIS — Z7401 Bed confinement status: Secondary | ICD-10-CM | POA: Diagnosis not present

## 2018-03-07 DIAGNOSIS — Z9181 History of falling: Secondary | ICD-10-CM | POA: Diagnosis not present

## 2018-03-07 DIAGNOSIS — S82892D Other fracture of left lower leg, subsequent encounter for closed fracture with routine healing: Secondary | ICD-10-CM | POA: Diagnosis not present

## 2018-03-09 DIAGNOSIS — Z9181 History of falling: Secondary | ICD-10-CM | POA: Diagnosis not present

## 2018-03-09 DIAGNOSIS — S82892D Other fracture of left lower leg, subsequent encounter for closed fracture with routine healing: Secondary | ICD-10-CM | POA: Diagnosis not present

## 2018-03-10 DIAGNOSIS — S82892D Other fracture of left lower leg, subsequent encounter for closed fracture with routine healing: Secondary | ICD-10-CM | POA: Diagnosis not present

## 2018-03-10 DIAGNOSIS — Z9181 History of falling: Secondary | ICD-10-CM | POA: Diagnosis not present

## 2018-03-12 DIAGNOSIS — Z9181 History of falling: Secondary | ICD-10-CM | POA: Diagnosis not present

## 2018-03-12 DIAGNOSIS — S82892D Other fracture of left lower leg, subsequent encounter for closed fracture with routine healing: Secondary | ICD-10-CM | POA: Diagnosis not present

## 2018-03-13 DIAGNOSIS — Z9181 History of falling: Secondary | ICD-10-CM | POA: Diagnosis not present

## 2018-03-13 DIAGNOSIS — S82892D Other fracture of left lower leg, subsequent encounter for closed fracture with routine healing: Secondary | ICD-10-CM | POA: Diagnosis not present

## 2018-03-16 DIAGNOSIS — S82892D Other fracture of left lower leg, subsequent encounter for closed fracture with routine healing: Secondary | ICD-10-CM | POA: Diagnosis not present

## 2018-03-16 DIAGNOSIS — Z9181 History of falling: Secondary | ICD-10-CM | POA: Diagnosis not present

## 2018-03-17 DIAGNOSIS — S82892D Other fracture of left lower leg, subsequent encounter for closed fracture with routine healing: Secondary | ICD-10-CM | POA: Diagnosis not present

## 2018-03-17 DIAGNOSIS — Z9181 History of falling: Secondary | ICD-10-CM | POA: Diagnosis not present

## 2018-03-19 DIAGNOSIS — Z9181 History of falling: Secondary | ICD-10-CM | POA: Diagnosis not present

## 2018-03-19 DIAGNOSIS — S82892D Other fracture of left lower leg, subsequent encounter for closed fracture with routine healing: Secondary | ICD-10-CM | POA: Diagnosis not present

## 2018-03-23 DIAGNOSIS — Z9181 History of falling: Secondary | ICD-10-CM | POA: Diagnosis not present

## 2018-03-23 DIAGNOSIS — S82892D Other fracture of left lower leg, subsequent encounter for closed fracture with routine healing: Secondary | ICD-10-CM | POA: Diagnosis not present

## 2018-03-24 DIAGNOSIS — S82892D Other fracture of left lower leg, subsequent encounter for closed fracture with routine healing: Secondary | ICD-10-CM | POA: Diagnosis not present

## 2018-03-24 DIAGNOSIS — Z9181 History of falling: Secondary | ICD-10-CM | POA: Diagnosis not present

## 2018-03-26 DIAGNOSIS — Z9181 History of falling: Secondary | ICD-10-CM | POA: Diagnosis not present

## 2018-03-26 DIAGNOSIS — S82892D Other fracture of left lower leg, subsequent encounter for closed fracture with routine healing: Secondary | ICD-10-CM | POA: Diagnosis not present

## 2018-03-31 DIAGNOSIS — S82852A Displaced trimalleolar fracture of left lower leg, initial encounter for closed fracture: Secondary | ICD-10-CM | POA: Diagnosis not present

## 2018-03-31 DIAGNOSIS — Z7401 Bed confinement status: Secondary | ICD-10-CM | POA: Diagnosis not present

## 2018-04-01 DIAGNOSIS — Z9181 History of falling: Secondary | ICD-10-CM | POA: Diagnosis not present

## 2018-04-01 DIAGNOSIS — S82892D Other fracture of left lower leg, subsequent encounter for closed fracture with routine healing: Secondary | ICD-10-CM | POA: Diagnosis not present

## 2018-04-02 DIAGNOSIS — S82892D Other fracture of left lower leg, subsequent encounter for closed fracture with routine healing: Secondary | ICD-10-CM | POA: Diagnosis not present

## 2018-04-02 DIAGNOSIS — Z9181 History of falling: Secondary | ICD-10-CM | POA: Diagnosis not present

## 2018-04-07 DIAGNOSIS — Z9181 History of falling: Secondary | ICD-10-CM | POA: Diagnosis not present

## 2018-04-07 DIAGNOSIS — S82892D Other fracture of left lower leg, subsequent encounter for closed fracture with routine healing: Secondary | ICD-10-CM | POA: Diagnosis not present

## 2018-04-08 DIAGNOSIS — S82892D Other fracture of left lower leg, subsequent encounter for closed fracture with routine healing: Secondary | ICD-10-CM | POA: Diagnosis not present

## 2018-04-08 DIAGNOSIS — Z9181 History of falling: Secondary | ICD-10-CM | POA: Diagnosis not present

## 2018-04-09 DIAGNOSIS — Z9181 History of falling: Secondary | ICD-10-CM | POA: Diagnosis not present

## 2018-04-09 DIAGNOSIS — S82892D Other fracture of left lower leg, subsequent encounter for closed fracture with routine healing: Secondary | ICD-10-CM | POA: Diagnosis not present

## 2018-04-10 DIAGNOSIS — S82892D Other fracture of left lower leg, subsequent encounter for closed fracture with routine healing: Secondary | ICD-10-CM | POA: Diagnosis not present

## 2018-04-10 DIAGNOSIS — Z9181 History of falling: Secondary | ICD-10-CM | POA: Diagnosis not present

## 2018-04-13 DIAGNOSIS — Z7401 Bed confinement status: Secondary | ICD-10-CM | POA: Diagnosis not present

## 2018-04-13 DIAGNOSIS — M25572 Pain in left ankle and joints of left foot: Secondary | ICD-10-CM | POA: Diagnosis not present

## 2018-04-14 DIAGNOSIS — S82892D Other fracture of left lower leg, subsequent encounter for closed fracture with routine healing: Secondary | ICD-10-CM | POA: Diagnosis not present

## 2018-04-14 DIAGNOSIS — Z9181 History of falling: Secondary | ICD-10-CM | POA: Diagnosis not present

## 2018-04-16 DIAGNOSIS — Z9181 History of falling: Secondary | ICD-10-CM | POA: Diagnosis not present

## 2018-04-16 DIAGNOSIS — S82892D Other fracture of left lower leg, subsequent encounter for closed fracture with routine healing: Secondary | ICD-10-CM | POA: Diagnosis not present

## 2018-04-17 DIAGNOSIS — S82892D Other fracture of left lower leg, subsequent encounter for closed fracture with routine healing: Secondary | ICD-10-CM | POA: Diagnosis not present

## 2018-04-17 DIAGNOSIS — Z9181 History of falling: Secondary | ICD-10-CM | POA: Diagnosis not present

## 2018-04-20 DIAGNOSIS — S82892D Other fracture of left lower leg, subsequent encounter for closed fracture with routine healing: Secondary | ICD-10-CM | POA: Diagnosis not present

## 2018-04-20 DIAGNOSIS — Z9181 History of falling: Secondary | ICD-10-CM | POA: Diagnosis not present

## 2018-04-23 DIAGNOSIS — Z9181 History of falling: Secondary | ICD-10-CM | POA: Diagnosis not present

## 2018-04-23 DIAGNOSIS — M1710 Unilateral primary osteoarthritis, unspecified knee: Secondary | ICD-10-CM | POA: Diagnosis not present

## 2018-04-23 DIAGNOSIS — I10 Essential (primary) hypertension: Secondary | ICD-10-CM | POA: Diagnosis not present

## 2018-04-23 DIAGNOSIS — S82892D Other fracture of left lower leg, subsequent encounter for closed fracture with routine healing: Secondary | ICD-10-CM | POA: Diagnosis not present

## 2018-04-23 DIAGNOSIS — T8131XD Disruption of external operation (surgical) wound, not elsewhere classified, subsequent encounter: Secondary | ICD-10-CM | POA: Diagnosis not present

## 2018-04-23 DIAGNOSIS — G709 Myoneural disorder, unspecified: Secondary | ICD-10-CM | POA: Diagnosis not present

## 2018-04-23 DIAGNOSIS — G35 Multiple sclerosis: Secondary | ICD-10-CM | POA: Diagnosis not present

## 2018-04-24 DIAGNOSIS — T8131XD Disruption of external operation (surgical) wound, not elsewhere classified, subsequent encounter: Secondary | ICD-10-CM | POA: Diagnosis not present

## 2018-04-24 DIAGNOSIS — S82892D Other fracture of left lower leg, subsequent encounter for closed fracture with routine healing: Secondary | ICD-10-CM | POA: Diagnosis not present

## 2018-04-24 DIAGNOSIS — G709 Myoneural disorder, unspecified: Secondary | ICD-10-CM | POA: Diagnosis not present

## 2018-04-24 DIAGNOSIS — M1710 Unilateral primary osteoarthritis, unspecified knee: Secondary | ICD-10-CM | POA: Diagnosis not present

## 2018-04-24 DIAGNOSIS — I10 Essential (primary) hypertension: Secondary | ICD-10-CM | POA: Diagnosis not present

## 2018-04-24 DIAGNOSIS — G35 Multiple sclerosis: Secondary | ICD-10-CM | POA: Diagnosis not present

## 2018-04-27 ENCOUNTER — Telehealth: Payer: Self-pay

## 2018-04-27 MED ORDER — HYDROCHLOROTHIAZIDE 25 MG PO TABS: 25.0000 mg | ORAL_TABLET | Freq: Every day | ORAL | 3 refills | Status: DC

## 2018-04-27 MED ORDER — LOSARTAN POTASSIUM 100 MG PO TABS: 100.0000 mg | ORAL_TABLET | Freq: Every day | ORAL | 3 refills | Status: DC

## 2018-04-27 NOTE — Telephone Encounter (Signed)
Fax from pharmacy. Losartan-HCTZ 100/25 mg is out of stock.   Please send in 2 separate Rx's.

## 2018-04-28 DIAGNOSIS — T8131XD Disruption of external operation (surgical) wound, not elsewhere classified, subsequent encounter: Secondary | ICD-10-CM | POA: Diagnosis not present

## 2018-04-28 DIAGNOSIS — M1710 Unilateral primary osteoarthritis, unspecified knee: Secondary | ICD-10-CM | POA: Diagnosis not present

## 2018-04-28 DIAGNOSIS — I10 Essential (primary) hypertension: Secondary | ICD-10-CM | POA: Diagnosis not present

## 2018-04-28 DIAGNOSIS — S82892D Other fracture of left lower leg, subsequent encounter for closed fracture with routine healing: Secondary | ICD-10-CM | POA: Diagnosis not present

## 2018-04-28 DIAGNOSIS — G35 Multiple sclerosis: Secondary | ICD-10-CM | POA: Diagnosis not present

## 2018-04-28 DIAGNOSIS — G709 Myoneural disorder, unspecified: Secondary | ICD-10-CM | POA: Diagnosis not present

## 2018-05-04 ENCOUNTER — Other Ambulatory Visit: Payer: Self-pay

## 2018-05-04 ENCOUNTER — Inpatient Hospital Stay: Admission: RE | Admit: 2018-05-04 | Payer: Medicare Other | Source: Ambulatory Visit

## 2018-05-04 ENCOUNTER — Encounter
Admission: RE | Admit: 2018-05-04 | Discharge: 2018-05-04 | Disposition: A | Discharge status: Home / Self Care | Payer: Medicare Other | Source: Ambulatory Visit | Attending: Orthopedic Surgery | Admitting: Orthopedic Surgery

## 2018-05-04 DIAGNOSIS — Y831 Surgical operation with implant of artificial internal device as the cause of abnormal reaction of the patient, or of later complication, without mention of misadventure at the time of the procedure: Secondary | ICD-10-CM | POA: Diagnosis not present

## 2018-05-04 DIAGNOSIS — T84625A Infection and inflammatory reaction due to internal fixation device of left fibula, initial encounter: Secondary | ICD-10-CM | POA: Diagnosis not present

## 2018-05-04 DIAGNOSIS — I10 Essential (primary) hypertension: Secondary | ICD-10-CM | POA: Diagnosis not present

## 2018-05-04 DIAGNOSIS — Z6841 Body Mass Index (BMI) 40.0 and over, adult: Secondary | ICD-10-CM | POA: Diagnosis not present

## 2018-05-04 DIAGNOSIS — E785 Hyperlipidemia, unspecified: Secondary | ICD-10-CM | POA: Diagnosis not present

## 2018-05-04 DIAGNOSIS — G35 Multiple sclerosis: Secondary | ICD-10-CM | POA: Diagnosis not present

## 2018-05-04 DIAGNOSIS — Z01810 Encounter for preprocedural cardiovascular examination: Secondary | ICD-10-CM | POA: Diagnosis not present

## 2018-05-04 DIAGNOSIS — Z79899 Other long term (current) drug therapy: Secondary | ICD-10-CM | POA: Diagnosis not present

## 2018-05-04 DIAGNOSIS — Z01818 Encounter for other preprocedural examination: Secondary | ICD-10-CM

## 2018-05-04 DIAGNOSIS — T84629A Infection and inflammatory reaction due to internal fixation device of unspecified bone of leg, initial encounter: Secondary | ICD-10-CM | POA: Diagnosis present

## 2018-05-04 HISTORY — DX: Unspecified urinary incontinence: R32

## 2018-05-04 LAB — CBC
HCT: 38.1 % (ref 36.0–46.0)
HEMOGLOBIN: 12.3 g/dL (ref 12.0–15.0)
MCH: 28.3 pg (ref 26.0–34.0)
MCHC: 32.3 g/dL (ref 30.0–36.0)
MCV: 87.8 fL (ref 80.0–100.0)
Platelets: 64 10*3/uL — ABNORMAL LOW (ref 150–400)
RBC: 4.34 MIL/uL (ref 3.87–5.11)
RDW: 14.2 % (ref 11.5–15.5)
WBC: 9.9 10*3/uL (ref 4.0–10.5)
nRBC: 0 % (ref 0.0–0.2)

## 2018-05-04 LAB — BASIC METABOLIC PANEL
ANION GAP: 11 (ref 5–15)
BUN: 20 mg/dL (ref 8–23)
CALCIUM: 9.3 mg/dL (ref 8.9–10.3)
CO2: 27 mmol/L (ref 22–32)
Chloride: 101 mmol/L (ref 98–111)
Creatinine, Ser: 0.73 mg/dL (ref 0.44–1.00)
GFR calc Af Amer: >60 mL/min (ref >60)
GFR calc non Af Amer: >60 mL/min (ref >60)
GLUCOSE: 107 mg/dL — AB (ref 70–99)
POTASSIUM: 4 mmol/L (ref 3.5–5.1)
SODIUM: 139 mmol/L (ref 135–145)

## 2018-05-04 MED ORDER — DEXTROSE 5 % IV SOLN
3.0000 g | Freq: Once | INTRAVENOUS | Status: AC
  Administered 2018-05-05: 3 g via INTRAVENOUS
  Filled 2018-05-04: qty 3

## 2018-05-04 NOTE — Pre-Procedure Instructions (Signed)
SPOKE WITH DR Rosey Bath AND OK TO PROCEED WITH PLT COUNT 64. WAS 55 IN September WHEN INITIAL SURGERY DONE.

## 2018-05-04 NOTE — Patient Instructions (Addendum)
Your procedure is scheduled on: 05/05/18 Report to [facility]. To find out your arrival time please call [phone] between 1PM - 3PM on TODAY.  Remember: Instructions that are not followed completely may result in serious medical risk, up to and including death, or upon the discretion of your surgeon and anesthesiologist your surgery may need to be rescheduled.     _X__ 1. Do not eat food after midnight the night before your procedure.                 No gum chewing or hard candies. You may drink clear liquids up to 2 hours                 before you are scheduled to arrive for your surgery- DO not drink clear                 liquids within 2 hours of the start of your surgery.                 Clear Liquids include:  water, apple juice without pulp, clear carbohydrate                 drink such as Clearfast or Gatorade, Black Coffee or Tea (Do not add                 anything to coffee or tea).  __X__2.  On the morning of surgery brush your teeth with toothpaste and water, you                 may rinse your mouth with mouthwash if you wish.  Do not swallow any              toothpaste of mouthwash.     _X__ 3.  No Alcohol for 24 hours before or after surgery.   _X__ 4.  Do Not Smoke or use e-cigarettes For 24 Hours Prior to Your Surgery.                 Do not use any chewable tobacco products for at least 6 hours prior to                 surgery.  ____  5.  Bring all medications with you on the day of surgery if instructed.   __X__  6.  Notify your doctor if there is any change in your medical condition      (cold, fever, infections).     Do not wear jewelry, make-up, hairpins, clips or nail polish. Do not wear lotions, powders, or perfumes.  Do not shave 48 hours prior to surgery. Men may shave face and neck. Do not bring valuables to the hospital.    [facility] is not responsible for any belongings or  valuables.  Contacts, dentures/partials or body piercings may not be worn into surgery. Bring a case for your contacts, glasses or hearing aids, a denture cup will be supplied. Leave your suitcase in the car. After surgery it may be brought to your room. For patients admitted to the hospital, discharge time is determined by your treatment team.   Patients discharged the day of surgery will not be allowed to drive home.   Please read over the following fact sheets that you were given:   MRSA Information  __X__ Take these medicines the morning of surgery with A SIP OF WATER:    1.  2.   3.   4.  5.  6.  ____ Fleet Enema (as directed)   __X__ Use CHG Soap/SAGE wipes as directed  ____ Use inhalers on the day of surgery  ____ Stop metformin/Janumet/Farxiga 2 days prior to surgery    ____ Take 1/2 of usual insulin dose the night before surgery. No insulin the morning          of surgery.   ____ Stop Blood Thinners Coumadin/Plavix/Xarelto/Pleta/Pradaxa/Eliquis/Effient/Aspirin  on   Or contact your Surgeon, Cardiologist or Medical Doctor regarding  ability to stop your blood thinners  __X__ Stop Anti-inflammatories 7 days before surgery such as Advil, Ibuprofen, Motrin,  BC or Goodies Powder, Naprosyn, Naproxen, Aleve, Aspirin    __X__ Stop all herbal supplements, fish oil or vitamin E until after surgery.    ____ Bring C-Pap to the hospital.

## 2018-05-05 ENCOUNTER — Ambulatory Visit: Payer: Medicare Other | Admitting: Anesthesiology

## 2018-05-05 ENCOUNTER — Other Ambulatory Visit: Payer: Self-pay

## 2018-05-05 ENCOUNTER — Ambulatory Visit
Admission: RE | Admit: 2018-05-05 | Discharge: 2018-05-05 | Disposition: A | Discharge status: Home / Self Care | Payer: Medicare Other | Source: Ambulatory Visit | Attending: Orthopedic Surgery | Admitting: Orthopedic Surgery

## 2018-05-05 ENCOUNTER — Encounter
Admission: RE | Disposition: A | Discharge status: Home / Self Care | Payer: Self-pay | Source: Ambulatory Visit | Attending: Orthopedic Surgery

## 2018-05-05 ENCOUNTER — Encounter: Payer: Self-pay | Admitting: *Deleted

## 2018-05-05 DIAGNOSIS — E785 Hyperlipidemia, unspecified: Secondary | ICD-10-CM | POA: Diagnosis not present

## 2018-05-05 DIAGNOSIS — Z79899 Other long term (current) drug therapy: Secondary | ICD-10-CM | POA: Diagnosis not present

## 2018-05-05 DIAGNOSIS — Z8781 Personal history of (healed) traumatic fracture: Principal | ICD-10-CM

## 2018-05-05 DIAGNOSIS — G35 Multiple sclerosis: Secondary | ICD-10-CM | POA: Insufficient documentation

## 2018-05-05 DIAGNOSIS — Z9889 Other specified postprocedural states: Secondary | ICD-10-CM

## 2018-05-05 DIAGNOSIS — M25572 Pain in left ankle and joints of left foot: Secondary | ICD-10-CM | POA: Diagnosis not present

## 2018-05-05 DIAGNOSIS — Z6841 Body Mass Index (BMI) 40.0 and over, adult: Secondary | ICD-10-CM | POA: Insufficient documentation

## 2018-05-05 DIAGNOSIS — I10 Essential (primary) hypertension: Secondary | ICD-10-CM | POA: Insufficient documentation

## 2018-05-05 DIAGNOSIS — T84625A Infection and inflammatory reaction due to internal fixation device of left fibula, initial encounter: Secondary | ICD-10-CM | POA: Insufficient documentation

## 2018-05-05 DIAGNOSIS — Y831 Surgical operation with implant of artificial internal device as the cause of abnormal reaction of the patient, or of later complication, without mention of misadventure at the time of the procedure: Secondary | ICD-10-CM | POA: Insufficient documentation

## 2018-05-05 DIAGNOSIS — T847XXA Infection and inflammatory reaction due to other internal orthopedic prosthetic devices, implants and grafts, initial encounter: Secondary | ICD-10-CM | POA: Diagnosis not present

## 2018-05-05 DIAGNOSIS — T8459XA Infection and inflammatory reaction due to other internal joint prosthesis, initial encounter: Secondary | ICD-10-CM | POA: Diagnosis not present

## 2018-05-05 HISTORY — PX: HARDWARE REMOVAL: SHX979

## 2018-05-05 SURGERY — REMOVAL, HARDWARE
Anesthesia: General | Laterality: Left

## 2018-05-05 MED ORDER — ONDANSETRON HCL 4 MG/2ML IJ SOLN: 4.0000 mg | Freq: Once | INTRAMUSCULAR | Status: DC | PRN

## 2018-05-05 MED ORDER — ACETAMINOPHEN 10 MG/ML IV SOLN
INTRAVENOUS | Status: AC
  Filled 2018-05-05: qty 100

## 2018-05-05 MED ORDER — PROPOFOL 10 MG/ML IV BOLUS
INTRAVENOUS | Status: DC | PRN
  Administered 2018-05-05: 200 mg via INTRAVENOUS

## 2018-05-05 MED ORDER — LIDOCAINE HCL (PF) 2 % IJ SOLN
INTRAMUSCULAR | Status: AC
  Filled 2018-05-05: qty 10

## 2018-05-05 MED ORDER — NEOMYCIN-POLYMYXIN B GU 40-200000 IR SOLN
Status: DC | PRN
  Administered 2018-05-05: 2 mL

## 2018-05-05 MED ORDER — FENTANYL CITRATE (PF) 100 MCG/2ML IJ SOLN
INTRAMUSCULAR | Status: AC
  Administered 2018-05-05: 25 ug via INTRAVENOUS
  Filled 2018-05-05: qty 2

## 2018-05-05 MED ORDER — LACTATED RINGERS IV SOLN
INTRAVENOUS | Status: DC
  Administered 2018-05-05: 15:00:00 via INTRAVENOUS

## 2018-05-05 MED ORDER — ONDANSETRON HCL 4 MG/2ML IJ SOLN
INTRAMUSCULAR | Status: DC | PRN
  Administered 2018-05-05: 4 mg via INTRAVENOUS

## 2018-05-05 MED ORDER — OXYCODONE HCL 5 MG PO TABS: 5.0000 mg | ORAL_TABLET | ORAL | 0 refills | Status: DC | PRN

## 2018-05-05 MED ORDER — LIDOCAINE HCL (CARDIAC) PF 100 MG/5ML IV SOSY
PREFILLED_SYRINGE | INTRAVENOUS | Status: DC | PRN
  Administered 2018-05-05: 100 mg via INTRAVENOUS

## 2018-05-05 MED ORDER — PROPOFOL 10 MG/ML IV BOLUS
INTRAVENOUS | Status: AC
  Filled 2018-05-05: qty 20

## 2018-05-05 MED ORDER — ROCURONIUM BROMIDE 50 MG/5ML IV SOLN
INTRAVENOUS | Status: AC
  Filled 2018-05-05: qty 1

## 2018-05-05 MED ORDER — DEXAMETHASONE SODIUM PHOSPHATE 10 MG/ML IJ SOLN
INTRAMUSCULAR | Status: DC | PRN
  Administered 2018-05-05: 10 mg via INTRAVENOUS

## 2018-05-05 MED ORDER — SCOPOLAMINE 1 MG/3DAYS TD PT72
MEDICATED_PATCH | TRANSDERMAL | Status: AC
  Administered 2018-05-05: 1.5 mg via TRANSDERMAL
  Filled 2018-05-05: qty 1

## 2018-05-05 MED ORDER — MIDAZOLAM HCL 5 MG/5ML IJ SOLN
INTRAMUSCULAR | Status: DC | PRN
  Administered 2018-05-05: 2 mg via INTRAVENOUS

## 2018-05-05 MED ORDER — ROCURONIUM BROMIDE 100 MG/10ML IV SOLN
INTRAVENOUS | Status: DC | PRN
  Administered 2018-05-05: 15 mg via INTRAVENOUS
  Administered 2018-05-05: 5 mg via INTRAVENOUS

## 2018-05-05 MED ORDER — ONDANSETRON HCL 4 MG/2ML IJ SOLN: 4.0000 mg | Freq: Four times a day (QID) | INTRAMUSCULAR | Status: DC | PRN

## 2018-05-05 MED ORDER — HYDROCODONE-ACETAMINOPHEN 5-325 MG PO TABS
1.0000 | ORAL_TABLET | ORAL | Status: DC | PRN
  Administered 2018-05-05: 1 via ORAL

## 2018-05-05 MED ORDER — SUGAMMADEX SODIUM 500 MG/5ML IV SOLN
INTRAVENOUS | Status: AC
  Filled 2018-05-05: qty 5

## 2018-05-05 MED ORDER — FENTANYL CITRATE (PF) 100 MCG/2ML IJ SOLN
25.0000 ug | INTRAMUSCULAR | Status: AC | PRN
  Administered 2018-05-05 (×6): 25 ug via INTRAVENOUS

## 2018-05-05 MED ORDER — ONDANSETRON HCL 4 MG PO TABS: 4.0000 mg | ORAL_TABLET | Freq: Four times a day (QID) | ORAL | Status: DC | PRN

## 2018-05-05 MED ORDER — METOCLOPRAMIDE HCL 10 MG PO TABS: 5.0000 mg | ORAL_TABLET | Freq: Three times a day (TID) | ORAL | Status: DC | PRN

## 2018-05-05 MED ORDER — FAMOTIDINE 20 MG PO TABS
20.0000 mg | ORAL_TABLET | Freq: Once | ORAL | Status: AC
  Administered 2018-05-05: 20 mg via ORAL

## 2018-05-05 MED ORDER — SODIUM CHLORIDE 0.9 % IV SOLN: INTRAVENOUS | Status: DC

## 2018-05-05 MED ORDER — SUCCINYLCHOLINE CHLORIDE 20 MG/ML IJ SOLN
INTRAMUSCULAR | Status: AC
  Filled 2018-05-05: qty 1

## 2018-05-05 MED ORDER — HYDROCODONE-ACETAMINOPHEN 5-325 MG PO TABS
ORAL_TABLET | ORAL | Status: AC
  Filled 2018-05-05: qty 1

## 2018-05-05 MED ORDER — METOCLOPRAMIDE HCL 5 MG/ML IJ SOLN: 5.0000 mg | Freq: Three times a day (TID) | INTRAMUSCULAR | Status: DC | PRN

## 2018-05-05 MED ORDER — FENTANYL CITRATE (PF) 100 MCG/2ML IJ SOLN
INTRAMUSCULAR | Status: DC | PRN
  Administered 2018-05-05 (×2): 50 ug via INTRAVENOUS

## 2018-05-05 MED ORDER — FENTANYL CITRATE (PF) 100 MCG/2ML IJ SOLN
INTRAMUSCULAR | Status: AC
  Filled 2018-05-05: qty 2

## 2018-05-05 MED ORDER — FAMOTIDINE 20 MG PO TABS
ORAL_TABLET | ORAL | Status: AC
  Administered 2018-05-05: 20 mg via ORAL
  Filled 2018-05-05: qty 1

## 2018-05-05 MED ORDER — DEXAMETHASONE SODIUM PHOSPHATE 10 MG/ML IJ SOLN
INTRAMUSCULAR | Status: AC
  Filled 2018-05-05: qty 1

## 2018-05-05 MED ORDER — SCOPOLAMINE 1 MG/3DAYS TD PT72
1.0000 | MEDICATED_PATCH | Freq: Once | TRANSDERMAL | Status: DC
  Administered 2018-05-05: 1.5 mg via TRANSDERMAL

## 2018-05-05 MED ORDER — SUGAMMADEX SODIUM 200 MG/2ML IV SOLN
INTRAVENOUS | Status: DC | PRN
  Administered 2018-05-05: 300 mg via INTRAVENOUS

## 2018-05-05 MED ORDER — HYDROMORPHONE HCL 1 MG/ML IJ SOLN
INTRAMUSCULAR | Status: AC
  Administered 2018-05-05: 0.5 mg via INTRAVENOUS
  Filled 2018-05-05: qty 1

## 2018-05-05 MED ORDER — ONDANSETRON HCL 4 MG/2ML IJ SOLN
INTRAMUSCULAR | Status: AC
  Filled 2018-05-05: qty 2

## 2018-05-05 MED ORDER — ACETAMINOPHEN 10 MG/ML IV SOLN
INTRAVENOUS | Status: DC | PRN
  Administered 2018-05-05: 1000 mg via INTRAVENOUS

## 2018-05-05 MED ORDER — HYDROMORPHONE HCL 1 MG/ML IJ SOLN
0.5000 mg | INTRAMUSCULAR | Status: DC | PRN
  Administered 2018-05-05 (×2): 0.5 mg via INTRAVENOUS

## 2018-05-05 MED ORDER — SUCCINYLCHOLINE CHLORIDE 20 MG/ML IJ SOLN
INTRAMUSCULAR | Status: DC | PRN
  Administered 2018-05-05: 100 mg via INTRAVENOUS

## 2018-05-05 MED ORDER — MIDAZOLAM HCL 2 MG/2ML IJ SOLN
INTRAMUSCULAR | Status: AC
  Filled 2018-05-05: qty 2

## 2018-05-05 SURGICAL SUPPLY — 38 items
BANDAGE ACE 6X5 VEL STRL LF (GAUZE/BANDAGES/DRESSINGS) ×2 IMPLANT
CANISTER SUCT 1200ML W/VALVE (MISCELLANEOUS) ×2 IMPLANT
CHLORAPREP W/TINT 26ML (MISCELLANEOUS) ×2 IMPLANT
COVER WAND RF STERILE (DRAPES) ×2 IMPLANT
CUFF TOURN 24 STER (MISCELLANEOUS) IMPLANT
DRAPE C-ARM XRAY 36X54 (DRAPES) ×2 IMPLANT
DRAPE INCISE IOBAN 66X45 STRL (DRAPES) ×2 IMPLANT
DRSG EMULSION OIL 3X8 NADH (GAUZE/BANDAGES/DRESSINGS) ×2 IMPLANT
ELECT CAUTERY BLADE 6.4 (BLADE) ×2 IMPLANT
ELECT REM PT RETURN 9FT ADLT (ELECTROSURGICAL) ×2
ELECTRODE REM PT RTRN 9FT ADLT (ELECTROSURGICAL) ×1 IMPLANT
GAUZE PETRO XEROFOAM 1X8 (MISCELLANEOUS) ×2 IMPLANT
GAUZE SPONGE 4X4 12PLY STRL (GAUZE/BANDAGES/DRESSINGS) ×2 IMPLANT
GLOVE SURG SYN 9.0  PF PI (GLOVE) ×1
GLOVE SURG SYN 9.0 PF PI (GLOVE) ×1 IMPLANT
GOWN SRG 2XL LVL 4 RGLN SLV (GOWNS) ×1 IMPLANT
GOWN STRL NON-REIN 2XL LVL4 (GOWNS) ×1
GOWN STRL REUS W/ TWL LRG LVL3 (GOWN DISPOSABLE) ×1 IMPLANT
GOWN STRL REUS W/TWL LRG LVL3 (GOWN DISPOSABLE) ×1
KIT PREVENA INCISION MGT 13 (CANNISTER) ×2 IMPLANT
KIT TURNOVER KIT A (KITS) ×2 IMPLANT
NEEDLE FILTER BLUNT 18X 1/2SAF (NEEDLE) ×1
NEEDLE FILTER BLUNT 18X1 1/2 (NEEDLE) ×1 IMPLANT
NS IRRIG 1000ML POUR BTL (IV SOLUTION) ×2 IMPLANT
PACK EXTREMITY ARMC (MISCELLANEOUS) ×2 IMPLANT
PAD ABD DERMACEA PRESS 5X9 (GAUZE/BANDAGES/DRESSINGS) ×4 IMPLANT
PADDING CAST 3IN STRL (MISCELLANEOUS) ×2
PADDING CAST BLEND 3X4 STRL (MISCELLANEOUS) ×2 IMPLANT
SCALPEL PROTECTED #15 DISP (BLADE) ×4 IMPLANT
STAPLER SKIN PROX 35W (STAPLE) ×2 IMPLANT
SUT ETHIBOND NAB CT1 #1 30IN (SUTURE) IMPLANT
SUT ETHILON 3-0 FS-10 30 BLK (SUTURE)
SUT VIC AB 0 CT1 36 (SUTURE) IMPLANT
SUT VIC AB 2-0 CT1 27 (SUTURE)
SUT VIC AB 2-0 CT1 TAPERPNT 27 (SUTURE) IMPLANT
SUTURE EHLN 3-0 FS-10 30 BLK (SUTURE) IMPLANT
SYR 10ML LL (SYRINGE) ×2 IMPLANT
WATER STERILE IRR 1000ML POUR (IV SOLUTION) ×2 IMPLANT

## 2018-05-05 NOTE — H&P (Signed)
Chief Complaint  Patient presents with  . Left Ankle - Post Operative Visit  . Post Operative Visit  L ANKLE ORIF 02/20/18.   History of the Present Illness: Leslie Smith is a 67 y.o. female here for follow-up of her ankle surgery performed on 02/20/2018. The lateral wound developed problems, and she returns today for recheck. She was seen by Rachelle Hora, PA on 04/13/2018 and did not have drainage of the wound at that time.   Her last x-ray was obtained on 03/31/2018 and showed good alignment.   The patient reports that her ankle feels that it is improving but then feels it is worsening. She notes that a nurse probed the wound with a swab last week and was uncomfortable. She presents today having Coban wrap over the wound and notes this was applied Friday 04/24/2018 approximately 3 days ago. She explains that her wound has a sensation that something is moving within the area. She explains she has MS and has nerve type sensations at times. She notes her MS also affects her pain levels.  I have reviewed past medical, surgical, social and family history, and allergies as documented in the EMR.  Past Medical History: Past Medical History:  Diagnosis Date  . Arthritis  . Cancer (CMS-HCC)  . Encounter for blood transfusion  unknown; ? after hysterectomy  . Hyperlipidemia  . Hypertension  . Multiple sclerosis (CMS-HCC)   Past Surgical History: Past Surgical History:  Procedure Laterality Date  . APPLICATION CRANIAL TONGS CALIPER/STEREOTACTIC FRAME Right 57/32/2025  Procedure: APPLICATION CRANIAL TONGS CALIPER/STEREOTACTIC FRAME; Surgeon: Ralene Cork, MD; Location: [facility]; Service: Neurosurgery; Laterality: Right;  . CRANIOTOMY FOR BRAIN TUMOR RESECTION Right 04/29/2013  Procedure: CRANIOTOMY FOR BRAIN TUMOR RESECTION; Surgeon: Ralene Cork, MD; Location: [facility]; Service: Neurosurgery; Laterality: Right;  . HARVEST TISSUE Right 04/29/2013   Procedure: HARVEST TISSUE; Surgeon: Ralene Cork, MD; Location: [facility]; Service: Neurosurgery; Laterality: Right; frontal  . HYSTERECTOMY  . MICROSURGERY Right 04/29/2013  Procedure: MICROSURGERY; Surgeon: Ralene Cork, MD; Location: [facility]; Service: Neurosurgery; Laterality: Right;   Past Family History: Family History  Problem Relation Age of Onset  . Coronary Artery Disease (Blocked arteries around heart) Father  fatal MI at 82yo  . Cancer Father  pancreatic   Medications: Current Outpatient Medications Ordered in Epic  Medication Sig Dispense Refill  . cholecalciferol (CHOLECALCIFEROL) 1,000 unit tablet Take by mouth Take 2,000 Units by mouth daily.  Marland Kitchen ibuprofen (ADVIL,MOTRIN) 800 MG tablet Take 800 mg by mouth every 6 (six) hours as needed for Pain.  Marland Kitchen losartan (COZAAR) 100 MG tablet Take 100 mg by mouth once daily  . melatonin 3 mg tablet Take 1 tablet (3 mg total) by mouth nightly Take 4-6 hours before bedtime 30 tablet 11  . oxyCODONE (ROXICODONE) 5 MG immediate release tablet Take by mouth Take 1 tablet (5 mg total) by mouth every 4 (four) hours as needed for moderate pain.  Marland Kitchen oxyCODONE-acetaminophen (PERCOCET) 5-325 mg tablet Take 1 tablet by mouth every 6 (six) hours as needed  . tiZANidine (ZANAFLEX) 4 MG tablet Take 4 mg by mouth every 8 (eight) hours as needed   No current Epic-ordered facility-administered medications on file.   Allergies: Allergies  Allergen Reactions  . Watermelon Diarrhea  Severe diarrhea within 2 minutes of eating  . Unable To Assess Other (See Comments)  . Atorvastatin Other (See Comments)  Word finding Other reaction(s): Other (See Comments) Word finding. Took only for  6 months. Also did not take due to MS  . Meloxicam Nausea    Body mass index is 49.96 kg/m.  Review of Systems: A comprehensive 14 point ROS was performed, reviewed, and the pertinent orthopaedic findings are documented in the  HPI.  Vitals:  04/27/18 0948  BP: 130/80   General Physical Examination:  General/Constitutional: No apparent distress: well-nourished and well developed. Eyes: Pupils equal, round with synchronous movement. Lungs: Clear to auscultation HEENT: Normal Vascular: No edema, swelling or tenderness, except as noted in detailed exam. Cardiac: Heart rate and rhythm is regular. Integumentary: No impressive skin lesions present, except as noted in detailed exam. Neuro/Psych: Normal mood and affect, oriented to person, place and time.  Musculoskeletal Examination: On exam, the Coban wrap was removed today and showed a small area of 1 cm with drainage. A new bandage was placed.   Lungs are clear. Heart rate and rhythm is normal. HEENT is normal.  Radiographs: No new imaging studies were obtained today.  Assessment: ICD-10-CM  1. Hardware complicating wound infection, subsequent encounter T84.7XXD  2. BMI 45.0-49.9, adult (CMS-HCC) [phone]   Plan: We discussed her continued treatment plan at length today. At this point, I think she has a deep infection related to her plate and the plate needs to be removed. We will schedule this removal in the near future.   Surgical Risks:  The nature of the condition and the proposed procedure has been reviewed in detail with the patient. Surgical versus non-surgical options and prognosis for recovery have been reviewed and the inherent risks and benefits of each have been discussed including the risks of infection, bleeding, injury to nerves / blood vessels / tendons, incomplete relief of symptoms, persisting pain and / or stiffness, loss of function, complex regional pain syndrome, failure of procedure, as appropriate.  Teeth: Normal  Scribe Attestation: I, Candie Mile, am acting as scribe for [employer], MD

## 2018-05-05 NOTE — Anesthesia Post-op Follow-up Note (Signed)
Anesthesia QCDR form completed.        

## 2018-05-05 NOTE — Care Management (Signed)
RNCM notified that patient is followed by [facility] for home health.

## 2018-05-05 NOTE — Transfer of Care (Signed)
Immediate Anesthesia Transfer of Care Note  Patient: Leslie Smith  Procedure(s) Performed: HARDWARE REMOVAL- LEFT ANKLE (Left )  Patient Location: PACU  Anesthesia Type:General  Level of Consciousness: awake and patient cooperative  Airway & Oxygen Therapy: Patient Spontanous Breathing and Patient connected to face mask oxygen  Post-op Assessment: Report given to RN and Post -op Vital signs reviewed and stable  Post vital signs: Reviewed and stable  Last Vitals:  Vitals Value Taken Time  BP 152/58 05/05/2018  5:43 PM  Temp 36.4 C 05/05/2018  5:43 PM  Pulse 88 05/05/2018  5:44 PM  Resp 16 05/05/2018  5:44 PM  SpO2 97 % 05/05/2018  5:44 PM  Vitals shown include unvalidated device data.  Last Pain:  Vitals:   05/05/18 1421  TempSrc: Tympanic  PainSc: 3          Complications: No apparent anesthesia complications

## 2018-05-05 NOTE — H&P (Signed)
Reviewed paper H+P, will be scanned into chart. No changes noted.  

## 2018-05-05 NOTE — Anesthesia Postprocedure Evaluation (Signed)
Anesthesia Post Note  Patient: Leslie Smith  Procedure(s) Performed: HARDWARE REMOVAL- LEFT ANKLE (Left )  Patient location during evaluation: PACU Anesthesia Type: General Level of consciousness: awake and alert Pain management: pain level controlled Vital Signs Assessment: post-procedure vital signs reviewed and stable Respiratory status: spontaneous breathing and respiratory function stable Cardiovascular status: stable Anesthetic complications: no     Last Vitals:  Vitals:   05/05/18 1758 05/05/18 1805  BP: (!) 129/46   Pulse: 79 73  Resp: 12 16  Temp:    SpO2: 96% 95%    Last Pain:  Vitals:   05/05/18 1805  TempSrc:   PainSc: 7                  KEPHART,WILLIAM K

## 2018-05-05 NOTE — Op Note (Signed)
05/05/2018  5:45 PM  PATIENT:  Leslie Smith  67 y.o. female  PRE-OPERATIVE DIAGNOSIS:  HARDWARE COMPLICATING WOUND INFECTION  POST-OPERATIVE DIAGNOSIS:  HARDWARE COMPLICATING WOUND INFECTION  PROCEDURE:  Procedure(s): HARDWARE REMOVAL- LEFT ANKLE (Left)  SURGEON: Laurene Footman, MD  ASSISTANTS: None  ANESTHESIA:   general  EBL:  No intake/output data recorded.  BLOOD ADMINISTERED:none  DRAINS: none   LOCAL MEDICATIONS USED:  NONE  SPECIMEN:  No Specimen  DISPOSITION OF SPECIMEN:  N/A  COUNTS:  YES  TOURNIQUET:   14 minutes at 300 mmHg  IMPLANTS: None  DICTATION: .Dragon Dictation patient was brought to the operating room and after adequate anesthesia was obtained the left leg was prepped and draped in the usual sterile fashion with a tourniquet applied to the upper thigh.  After appropriate patient identification and timeout procedures were completed, tourniquet was raised.  Going through the prior incision the skin and subcutaneous tissue was spread in the midportion of the wound the skin was quite fibrotic fibrotic with granulation tissue present.  The plate was exposed without difficulty and all screws removed again without difficulty and the plate removed.  Francee Piccolo was used to clean off any granulation tissue from the wound and over the lateral bone.  The fracture appeared healed.  The wound was then thoroughly irrigated and then partially closed using skin staples with an incisional wound VAC then applied.  The leg was then wrapped with web roll and an Ace wrap and patient sent to recovery in stable condition, hardware was discarded  PLAN OF CARE: Discharge to home after PACU  PATIENT DISPOSITION:  PACU - hemodynamically stable.

## 2018-05-05 NOTE — Anesthesia Procedure Notes (Signed)
Procedure Name: Intubation Date/Time: 05/05/2018 5:02 PM Performed by: Dionne Bucy, CRNA Pre-anesthesia Checklist: Patient identified, Patient being monitored, Timeout performed, Emergency Drugs available and Suction available Patient Re-evaluated:Patient Re-evaluated prior to induction Oxygen Delivery Method: Circle system utilized Preoxygenation: Pre-oxygenation with 100% oxygen Induction Type: IV induction Ventilation: Mask ventilation without difficulty Laryngoscope Size: 3 and McGraph Grade View: Grade I Tube type: Oral Tube size: 7.0 mm Number of attempts: 1 Airway Equipment and Method: Stylet Placement Confirmation: ETT inserted through vocal cords under direct vision,  positive ETCO2 and breath sounds checked- equal and bilateral Secured at: 21 cm Tube secured with: Tape Dental Injury: Teeth and Oropharynx as per pre-operative assessment

## 2018-05-05 NOTE — Discharge Instructions (Addendum)
AMBULATORY SURGERY  DISCHARGE INSTRUCTIONS   1) The drugs that you were given will stay in your system until tomorrow so for the next 24 hours you should not:  A) Drive an automobile B) Make any legal decisions C) Drink any alcoholic beverage   2) You may resume regular meals tomorrow.  Today it is better to start with liquids and gradually work up to solid foods.  You may eat anything you prefer, but it is better to start with liquids, then soup and crackers, and gradually work up to solid foods.   3) Please notify your doctor immediately if you have any unusual bleeding, trouble breathing, redness and pain at the surgery site, drainage, fever, or pain not relieved by medication. 4)   5) Your post-operative visit with Dr.                                     is: Date:                        Time:    Please call to schedule your post-operative visit.  6) Additional Instructions:        Keep leg elevated is much as possible this week.  Pain medicine as directed.  Keep dressing in place until return visit.  Wound VAC should last for 1 week before stopping.  If it gets full continuously beeps, remove entire bandage put gauze over the wound and reapply Ace wrap only

## 2018-05-05 NOTE — Anesthesia Preprocedure Evaluation (Signed)
Anesthesia Evaluation  Patient identified by MRN, date of birth, ID band Patient awake    Reviewed: Allergy & Precautions, H&P , NPO status , Patient's Chart, lab work & pertinent test results  History of Anesthesia Complications (+) PONV and history of anesthetic complications  Airway Mallampati: III  TM Distance: <3 FB Neck ROM: full    Dental  (+) Chipped, Poor Dentition, Missing, Implants   Pulmonary neg pulmonary ROS, neg shortness of breath,           Cardiovascular Exercise Tolerance: Good hypertension, (-) angina(-) Past MI and (-) DOE      Neuro/Psych  Neuromuscular disease negative psych ROS   GI/Hepatic negative GI ROS, Neg liver ROS, neg GERD  ,  Endo/Other  Morbid obesity  Renal/GU      Musculoskeletal  (+) Arthritis ,   Abdominal   Peds negative pediatric ROS (+)  Hematology negative hematology ROS (+)   Anesthesia Other Findings Past Medical History: 09/15/2014: 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[place]) 2014: Cancer ([place])     Comment:  brain tumor, resected 2019: Cellulitis and abscess of leg     Comment:  2 episodes this year No date: Complication of anesthesia 2018: DJD (degenerative joint disease) of knee No date: Fall No date: Fatigue No date: History of blood transfusion No date: Hypertension No date: MS (multiple sclerosis) ([place]) No date: Myelitis ([place]) 2001: Neuromuscular disorder ([place])     Comment:  multiple sclerosis diagnosed by MRI No date: PONV (postoperative nausea and vomiting)     Comment:  severe No date: Thrombocytopenia ([place])  Past Surgical History: 2011: ABDOMINAL HYSTERECTOMY     Comment:  required wound vac for 6 weeks. ovaries had grown               attached to her back bone 2015: BRAIN SURGERY     Comment:  tumor resection-denies seizures 2014: BREAST SURGERY; Left     Comment:  biopsy. negative 08/25/2015: I&D EXTREMITY; Left     Comment:  Procedure: IRRIGATION AND  DEBRIDEMENT THUMB;  Surgeon:               Iran Planas, MD;  Location: [facility];  Service:               Orthopedics;  Laterality: Left;  BMI    Body Mass Index:  46.99 kg/m      Reproductive/Obstetrics negative OB ROS                             Anesthesia Physical  Anesthesia Plan  ASA: III  Anesthesia Plan: General ETT   Post-op Pain Management:    Induction: Intravenous  PONV Risk Score and Plan: Ondansetron, Dexamethasone, Midazolam and Treatment may vary due to age or medical condition  Airway Management Planned: Oral ETT and Video Laryngoscope Planned  Additional Equipment:   Intra-op Plan:   Post-operative Plan: Extubation in OR  Informed Consent: I have reviewed the patients History and Physical, chart, labs and discussed the procedure including the risks, benefits and alternatives for the proposed anesthesia with the patient or authorized representative who has indicated his/her understanding and acceptance.   Dental Advisory Given  Plan Discussed with: Anesthesiologist, CRNA and Surgeon  Anesthesia Plan Comments: (Patient consented for risk of MS flare and voiced understanding  Patient consented for risks of anesthesia including but not limited to:  - adverse reactions to medications - damage to teeth, lips or other  oral mucosa - sore throat or hoarseness - Damage to heart, brain, lungs or loss of life  Patient voiced understanding.)        Anesthesia Quick Evaluation

## 2018-05-06 ENCOUNTER — Encounter: Payer: Self-pay | Admitting: Orthopedic Surgery

## 2018-06-23 DIAGNOSIS — M25572 Pain in left ankle and joints of left foot: Secondary | ICD-10-CM | POA: Diagnosis not present

## 2018-06-23 DIAGNOSIS — T847XXD Infection and inflammatory reaction due to other internal orthopedic prosthetic devices, implants and grafts, subsequent encounter: Secondary | ICD-10-CM | POA: Diagnosis not present

## 2018-06-23 DIAGNOSIS — M84473G Pathological fracture, unspecified ankle, subsequent encounter for fracture with delayed healing: Secondary | ICD-10-CM | POA: Diagnosis not present

## 2018-06-25 DIAGNOSIS — M84473G Pathological fracture, unspecified ankle, subsequent encounter for fracture with delayed healing: Secondary | ICD-10-CM | POA: Diagnosis not present

## 2018-06-25 DIAGNOSIS — T847XXD Infection and inflammatory reaction due to other internal orthopedic prosthetic devices, implants and grafts, subsequent encounter: Secondary | ICD-10-CM | POA: Diagnosis not present

## 2018-06-25 DIAGNOSIS — M25572 Pain in left ankle and joints of left foot: Secondary | ICD-10-CM | POA: Diagnosis not present

## 2018-06-30 ENCOUNTER — Ambulatory Visit: Payer: Medicare Other | Admitting: Family Medicine

## 2018-06-30 DIAGNOSIS — T847XXD Infection and inflammatory reaction due to other internal orthopedic prosthetic devices, implants and grafts, subsequent encounter: Secondary | ICD-10-CM | POA: Diagnosis not present

## 2018-06-30 DIAGNOSIS — M84473G Pathological fracture, unspecified ankle, subsequent encounter for fracture with delayed healing: Secondary | ICD-10-CM | POA: Diagnosis not present

## 2018-06-30 DIAGNOSIS — M25572 Pain in left ankle and joints of left foot: Secondary | ICD-10-CM | POA: Diagnosis not present

## 2018-07-02 DIAGNOSIS — M25572 Pain in left ankle and joints of left foot: Secondary | ICD-10-CM | POA: Diagnosis not present

## 2018-07-02 DIAGNOSIS — T847XXD Infection and inflammatory reaction due to other internal orthopedic prosthetic devices, implants and grafts, subsequent encounter: Secondary | ICD-10-CM | POA: Diagnosis not present

## 2018-07-02 DIAGNOSIS — M84473G Pathological fracture, unspecified ankle, subsequent encounter for fracture with delayed healing: Secondary | ICD-10-CM | POA: Diagnosis not present

## 2018-07-07 DIAGNOSIS — M25572 Pain in left ankle and joints of left foot: Secondary | ICD-10-CM | POA: Diagnosis not present

## 2018-07-07 DIAGNOSIS — T847XXD Infection and inflammatory reaction due to other internal orthopedic prosthetic devices, implants and grafts, subsequent encounter: Secondary | ICD-10-CM | POA: Diagnosis not present

## 2018-07-07 DIAGNOSIS — M84473G Pathological fracture, unspecified ankle, subsequent encounter for fracture with delayed healing: Secondary | ICD-10-CM | POA: Diagnosis not present

## 2018-07-09 DIAGNOSIS — T847XXD Infection and inflammatory reaction due to other internal orthopedic prosthetic devices, implants and grafts, subsequent encounter: Secondary | ICD-10-CM | POA: Diagnosis not present

## 2018-07-09 DIAGNOSIS — M25572 Pain in left ankle and joints of left foot: Secondary | ICD-10-CM | POA: Diagnosis not present

## 2018-07-09 DIAGNOSIS — M84473G Pathological fracture, unspecified ankle, subsequent encounter for fracture with delayed healing: Secondary | ICD-10-CM | POA: Diagnosis not present

## 2018-07-14 DIAGNOSIS — M25572 Pain in left ankle and joints of left foot: Secondary | ICD-10-CM | POA: Diagnosis not present

## 2018-07-14 DIAGNOSIS — M84473G Pathological fracture, unspecified ankle, subsequent encounter for fracture with delayed healing: Secondary | ICD-10-CM | POA: Diagnosis not present

## 2018-07-14 DIAGNOSIS — T847XXD Infection and inflammatory reaction due to other internal orthopedic prosthetic devices, implants and grafts, subsequent encounter: Secondary | ICD-10-CM | POA: Diagnosis not present

## 2018-07-21 DIAGNOSIS — M84473G Pathological fracture, unspecified ankle, subsequent encounter for fracture with delayed healing: Secondary | ICD-10-CM | POA: Diagnosis not present

## 2018-07-21 DIAGNOSIS — T847XXD Infection and inflammatory reaction due to other internal orthopedic prosthetic devices, implants and grafts, subsequent encounter: Secondary | ICD-10-CM | POA: Diagnosis not present

## 2018-07-21 DIAGNOSIS — M25572 Pain in left ankle and joints of left foot: Secondary | ICD-10-CM | POA: Diagnosis not present

## 2018-07-23 DIAGNOSIS — T847XXD Infection and inflammatory reaction due to other internal orthopedic prosthetic devices, implants and grafts, subsequent encounter: Secondary | ICD-10-CM | POA: Diagnosis not present

## 2018-07-23 DIAGNOSIS — M25572 Pain in left ankle and joints of left foot: Secondary | ICD-10-CM | POA: Diagnosis not present

## 2018-07-23 DIAGNOSIS — M84473G Pathological fracture, unspecified ankle, subsequent encounter for fracture with delayed healing: Secondary | ICD-10-CM | POA: Diagnosis not present

## 2018-07-28 DIAGNOSIS — M84473G Pathological fracture, unspecified ankle, subsequent encounter for fracture with delayed healing: Secondary | ICD-10-CM | POA: Diagnosis not present

## 2018-07-28 DIAGNOSIS — T847XXD Infection and inflammatory reaction due to other internal orthopedic prosthetic devices, implants and grafts, subsequent encounter: Secondary | ICD-10-CM | POA: Diagnosis not present

## 2018-07-28 DIAGNOSIS — M25572 Pain in left ankle and joints of left foot: Secondary | ICD-10-CM | POA: Diagnosis not present

## 2018-07-30 DIAGNOSIS — M25572 Pain in left ankle and joints of left foot: Secondary | ICD-10-CM | POA: Diagnosis not present

## 2018-07-30 DIAGNOSIS — T847XXD Infection and inflammatory reaction due to other internal orthopedic prosthetic devices, implants and grafts, subsequent encounter: Secondary | ICD-10-CM | POA: Diagnosis not present

## 2018-07-30 DIAGNOSIS — M84473G Pathological fracture, unspecified ankle, subsequent encounter for fracture with delayed healing: Secondary | ICD-10-CM | POA: Diagnosis not present

## 2018-08-04 DIAGNOSIS — T847XXD Infection and inflammatory reaction due to other internal orthopedic prosthetic devices, implants and grafts, subsequent encounter: Secondary | ICD-10-CM | POA: Diagnosis not present

## 2018-08-04 DIAGNOSIS — M84473G Pathological fracture, unspecified ankle, subsequent encounter for fracture with delayed healing: Secondary | ICD-10-CM | POA: Diagnosis not present

## 2018-08-04 DIAGNOSIS — M25572 Pain in left ankle and joints of left foot: Secondary | ICD-10-CM | POA: Diagnosis not present

## 2018-08-06 DIAGNOSIS — T847XXD Infection and inflammatory reaction due to other internal orthopedic prosthetic devices, implants and grafts, subsequent encounter: Secondary | ICD-10-CM | POA: Diagnosis not present

## 2018-08-06 DIAGNOSIS — M25572 Pain in left ankle and joints of left foot: Secondary | ICD-10-CM | POA: Diagnosis not present

## 2018-08-06 DIAGNOSIS — M84473G Pathological fracture, unspecified ankle, subsequent encounter for fracture with delayed healing: Secondary | ICD-10-CM | POA: Diagnosis not present

## 2018-08-11 DIAGNOSIS — M84473G Pathological fracture, unspecified ankle, subsequent encounter for fracture with delayed healing: Secondary | ICD-10-CM | POA: Diagnosis not present

## 2018-08-11 DIAGNOSIS — M25572 Pain in left ankle and joints of left foot: Secondary | ICD-10-CM | POA: Diagnosis not present

## 2018-08-11 DIAGNOSIS — T847XXD Infection and inflammatory reaction due to other internal orthopedic prosthetic devices, implants and grafts, subsequent encounter: Secondary | ICD-10-CM | POA: Diagnosis not present

## 2018-08-18 DIAGNOSIS — M25572 Pain in left ankle and joints of left foot: Secondary | ICD-10-CM | POA: Diagnosis not present

## 2018-08-18 DIAGNOSIS — T847XXD Infection and inflammatory reaction due to other internal orthopedic prosthetic devices, implants and grafts, subsequent encounter: Secondary | ICD-10-CM | POA: Diagnosis not present

## 2018-08-18 DIAGNOSIS — M84473G Pathological fracture, unspecified ankle, subsequent encounter for fracture with delayed healing: Secondary | ICD-10-CM | POA: Diagnosis not present

## 2018-09-01 DIAGNOSIS — Z6841 Body Mass Index (BMI) 40.0 and over, adult: Secondary | ICD-10-CM | POA: Diagnosis not present

## 2018-09-01 DIAGNOSIS — T847XXD Infection and inflammatory reaction due to other internal orthopedic prosthetic devices, implants and grafts, subsequent encounter: Secondary | ICD-10-CM | POA: Diagnosis not present

## 2018-09-19 IMAGING — CT CT VIRTUAL COLONOSCOPY DIAGNOSTIC
2 of 9 series · 12 of 46 positions shown, 18 images · non-contrast
Comparison: None.

CLINICAL DATA: Colon polyps and diverticulosis.  Thrombocytopenia.

EXAM:
CT VIRTUAL COLONOSCOPY DIAGNOSTIC
TECHNIQUE: The patient was given a standard Lo so bowel preparation with
Gastrografin and barium for fluid and stool tagging respectively.
The quality of the bowel preparation is moderate to excellent.
Automated CO2 insufflation of the colon was performed prior to image
acquisition and colonic distention is moderate to excellent on
supine imaging with areas of poor expansion on prone imaging. Image
post processing was used to generate a 3D endoluminal fly-through
projection of the colon and to electronically subtract stool/fluid
as appropriate.

[Series 5: supine colon 3.00 br40 s3 cor cor supine · coronal · 0.94mm/px · 3 of 162 slices shown]
[im 41/162  soft-tissue]
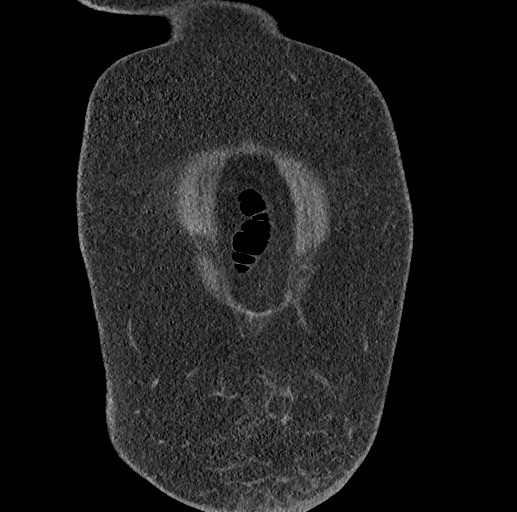
[im 81/162  soft-tissue]
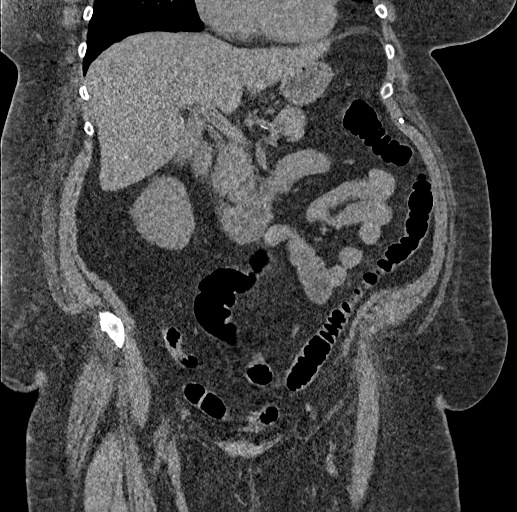
[im 121/162  soft-tissue]
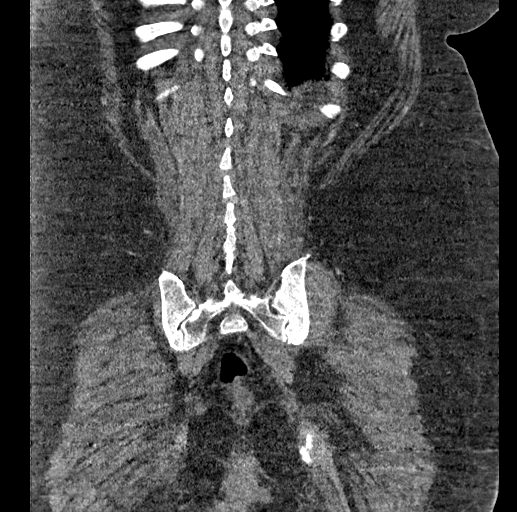

[Series 10: prone colon 1.50 br40 s3 prone thin · axial · 0.96mm/px · z∈[+1123,+1537]mm · 9 of 506 slices shown, 15 images]
[im 46/506  soft-tissue]
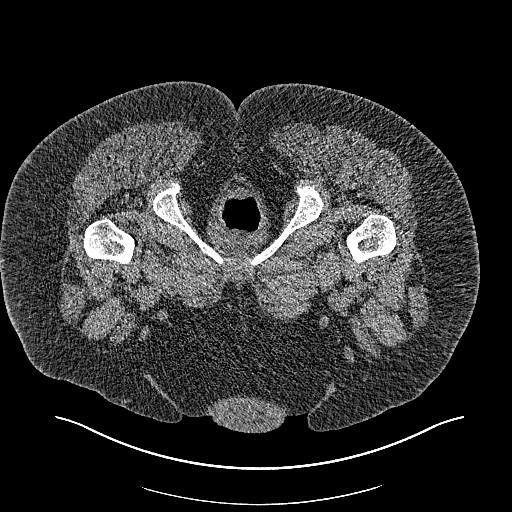
[im 46/506  bone]
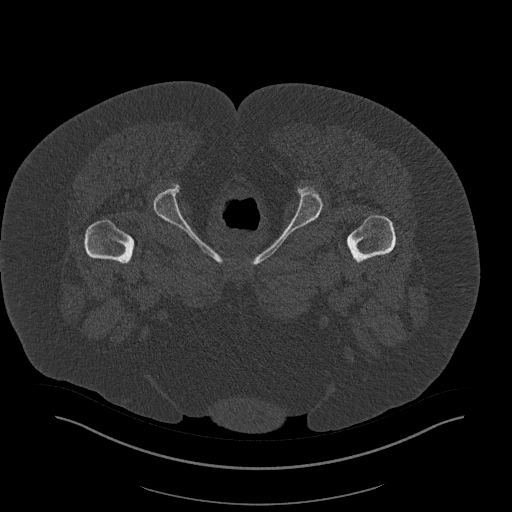
[im 92/506  soft-tissue]
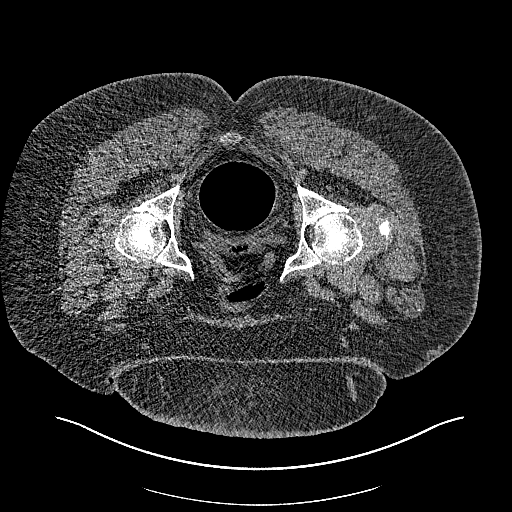
[im 138/506  soft-tissue]
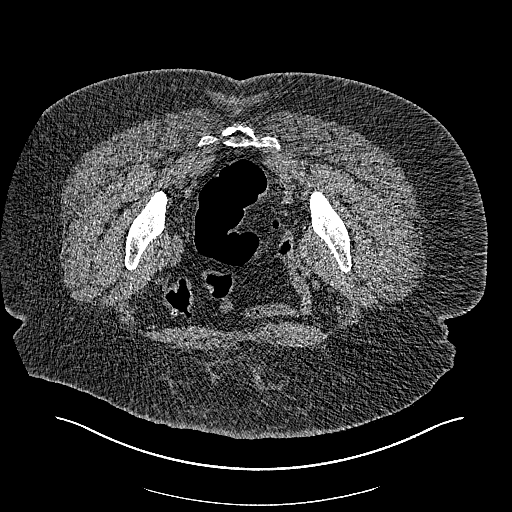
[im 184/506  soft-tissue]
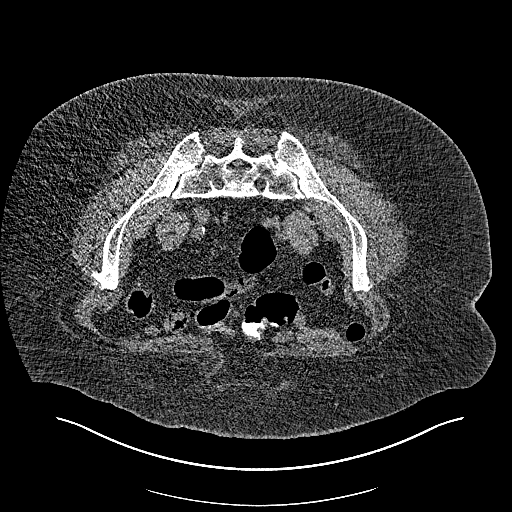
[im 276/506  soft-tissue]
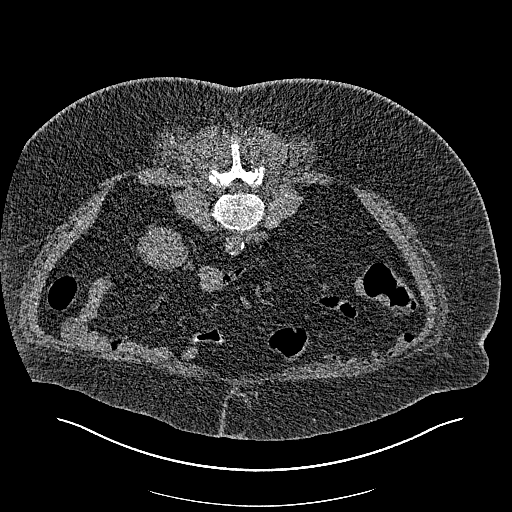
[im 322/506  soft-tissue]
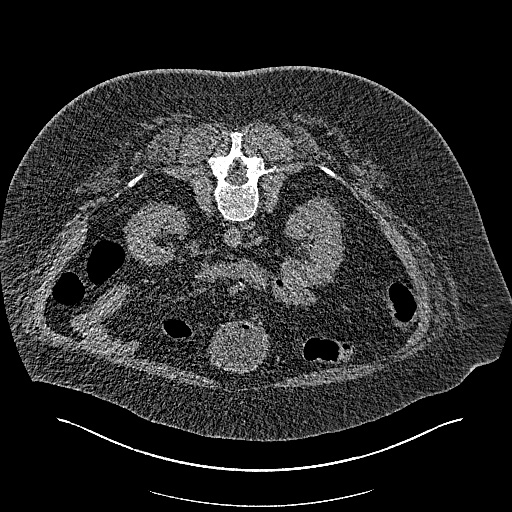
[im 322/506  lung]
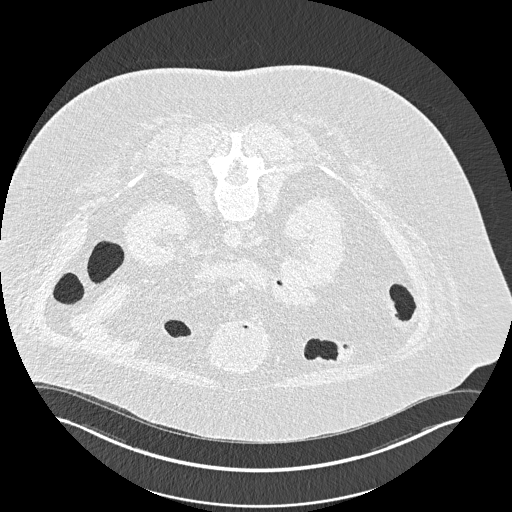
[im 368/506  soft-tissue]
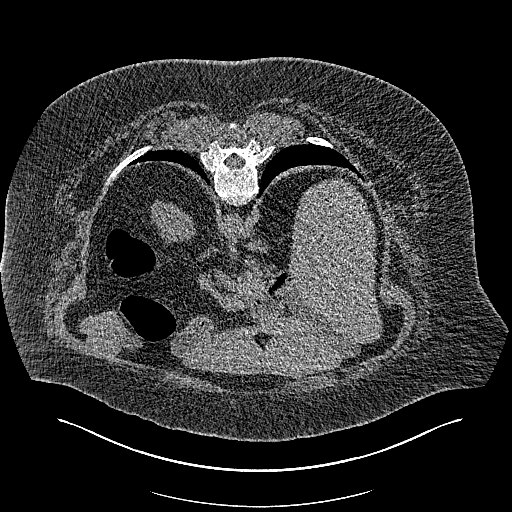
[im 368/506  lung]
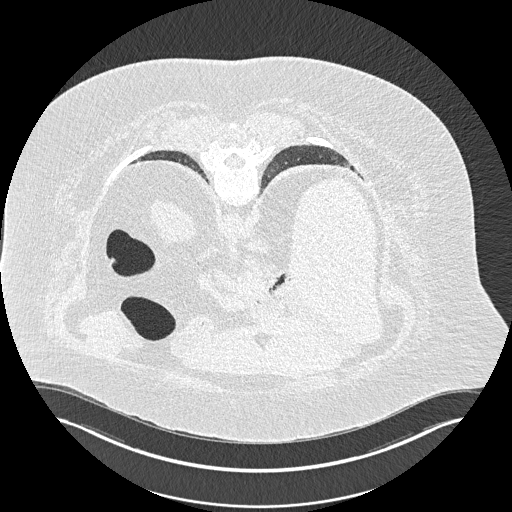
[im 414/506  soft-tissue]
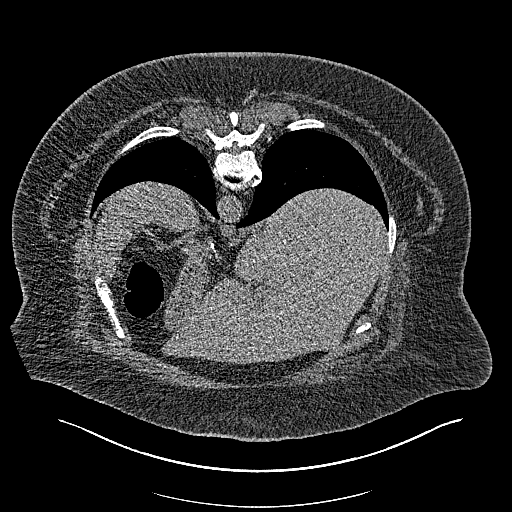
[im 414/506  lung]
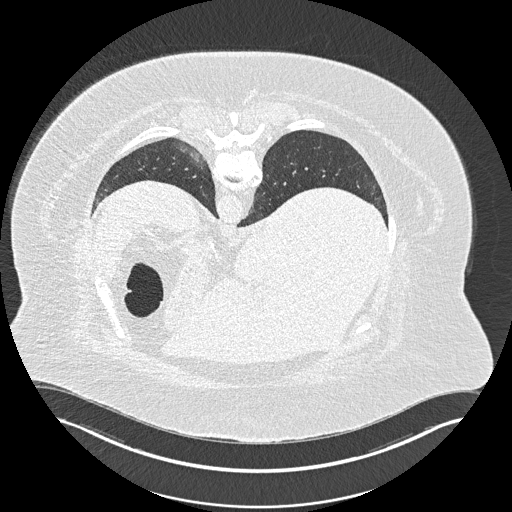
[im 460/506  soft-tissue]
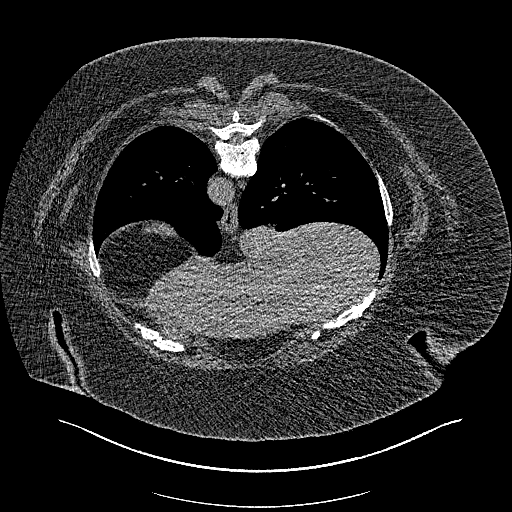
[im 460/506  lung]
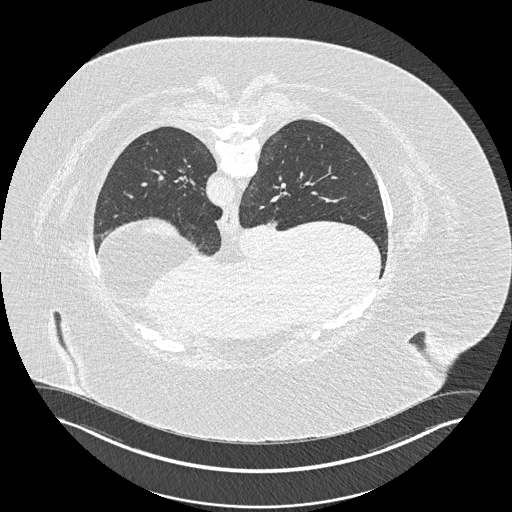
[im 460/506  bone]
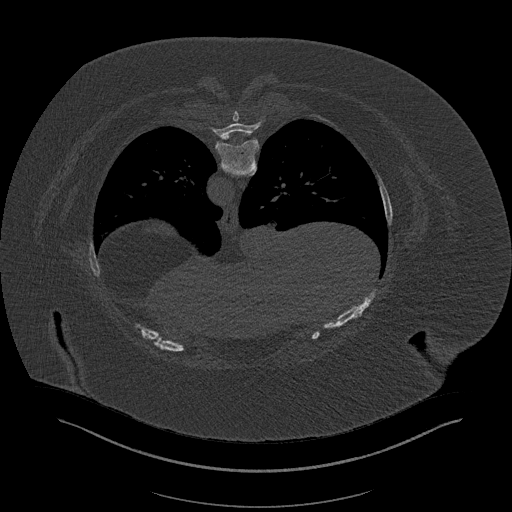

[12 of 46 positions shown; findings below may reference images not displayed]

FINDINGS: VIRTUAL COLONOSCOPY

A short segment of transverse colon is seen within a midline ventral
pelvic hernia and is poorly distended, limiting evaluation.
Otherwise, no polyp, mass or stricture.

Virtual colonoscopy is not designed to detect diminutive polyps
(i.e., less than or equal to 5 mm), the presence or absence of which
may not affect clinical management.

CT ABDOMEN AND PELVIS WITHOUT CONTRAST

Lower chest: No acute findings. Heart is mildly enlarged. No
pericardial or pleural effusion.

Hepatobiliary: Liver and gallbladder are unremarkable. No biliary
ductal dilatation.

Pancreas: Negative.

Spleen: Negative.

Adrenals/Urinary Tract: Adrenal glands and kidneys are unremarkable.
Ureters are decompressed. Bladder is low in volume.

Stomach/Bowel: Stomach, small bowel and appendix are unremarkable.
Colon is discussed in the virtual colonoscopy section of this
report. Midline pelvic wall hernia containing unobstructed
transverse colon is again noted.

Vascular/Lymphatic: Atherosclerotic calcification of the arterial
vasculature without abdominal aortic aneurysm. No pathologically
enlarged lymph nodes.

Reproductive: Hysterectomy.  No adnexal mass.

Other: Periumbilical hernia contains fat. Mesenteries and peritoneum
are unremarkable.

Musculoskeletal: Degenerative changes in the spine. No worrisome
lytic or sclerotic lesions.
IMPRESSION: 1. No definite polyp, mass or stricture.
2. Midline pelvic ventral hernia containing unobstructed transverse
colon.
3.  Aortic atherosclerosis ([id number]-170.0).

## 2018-09-24 DIAGNOSIS — G35 Multiple sclerosis: Secondary | ICD-10-CM | POA: Diagnosis not present

## 2018-12-21 ENCOUNTER — Other Ambulatory Visit: Payer: Self-pay

## 2018-12-21 ENCOUNTER — Ambulatory Visit (INDEPENDENT_AMBULATORY_CARE_PROVIDER_SITE_OTHER): Payer: Medicare Other

## 2018-12-21 VITALS — BP 132/60 | HR 67 | Temp 98.0°F | Resp 16 | Ht 65.0 in | Wt 317.9 lb

## 2018-12-21 DIAGNOSIS — I10 Essential (primary) hypertension: Secondary | ICD-10-CM

## 2018-12-21 DIAGNOSIS — E785 Hyperlipidemia, unspecified: Secondary | ICD-10-CM | POA: Diagnosis not present

## 2018-12-21 DIAGNOSIS — Z1239 Encounter for other screening for malignant neoplasm of breast: Secondary | ICD-10-CM

## 2018-12-21 DIAGNOSIS — Z780 Asymptomatic menopausal state: Secondary | ICD-10-CM | POA: Diagnosis not present

## 2018-12-21 DIAGNOSIS — Z0000 Encounter for general adult medical examination without abnormal findings: Secondary | ICD-10-CM | POA: Diagnosis not present

## 2018-12-21 DIAGNOSIS — R5383 Other fatigue: Secondary | ICD-10-CM

## 2018-12-21 DIAGNOSIS — D696 Thrombocytopenia, unspecified: Secondary | ICD-10-CM

## 2018-12-21 LAB — URINALYSIS, ROUTINE W REFLEX MICROSCOPIC
Bilirubin, UA: NEGATIVE
Glucose, UA: NEGATIVE
Ketones, UA: NEGATIVE
Leukocytes,UA: NEGATIVE
Nitrite, UA: NEGATIVE
Specific Gravity, UA: 1.025 (ref 1.005–1.030)
Urobilinogen, Ur: 0.2 mg/dL (ref 0.2–1.0)
pH, UA: 5.5 (ref 5.0–7.5)

## 2018-12-21 LAB — MICROSCOPIC EXAMINATION: WBC, UA: NONE SEEN /hpf (ref 0–5)

## 2018-12-21 NOTE — Patient Instructions (Signed)
Leslie Smith , Thank you for taking time to come for your Medicare Wellness Visit. I appreciate your ongoing commitment to your health goals. Please review the following plan we discussed and let me know if I can assist you in the future.   Screening recommendations/referrals: Colonoscopy: completed 11/22/2017 Mammogram: Please call [phone] to schedule your mammogram.  Bone Density: Please call [phone]  Recommended yearly ophthalmology/optometry visit for glaucoma screening and checkup Recommended yearly dental visit for hygiene and checkup  Vaccinations: Influenza vaccine: up to date  Pneumococcal vaccine: up to date  Tdap vaccine: up to date Shingles vaccine: shingrix eligible, check with your insurance company for coverage     Advanced directives: Please bring a copy of your health care power of attorney and living will to the office at your convenience.  Conditions/risks identified: increase stretching exercises.   Next appointment: Follow up in one year for your annual wellness exam.    Preventive Care 68 Years and Older, Female Preventive care refers to lifestyle choices and visits with your health care provider that can promote health and wellness. What does preventive care include?  A yearly physical exam. This is also called an annual well check.  Dental exams once or twice a year.  Routine eye exams. Ask your health care provider how often you should have your eyes checked.  Personal lifestyle choices, including:  Daily care of your teeth and gums.  Regular physical activity.  Eating a healthy diet.  Avoiding tobacco and drug use.  Limiting alcohol use.  Practicing safe sex.  Taking low-dose aspirin every day.  Taking vitamin and mineral supplements as recommended by your health care provider. What happens during an annual well check? The services and screenings done by your health care provider during your annual well check will depend on your age,  overall health, lifestyle risk factors, and family history of disease. Counseling  Your health care provider may ask you questions about your:  Alcohol use.  Tobacco use.  Drug use.  Emotional well-being.  Home and relationship well-being.  Sexual activity.  Eating habits.  History of falls.  Memory and ability to understand (cognition).  Work and work Statistician.  Reproductive health. Screening  You may have the following tests or measurements:  Height, weight, and BMI.  Blood pressure.  Lipid and cholesterol levels. These may be checked every 5 years, or more frequently if you are over 84 years old.  Skin check.  Lung cancer screening. You may have this screening every year starting at age 61 if you have a 30-pack-year history of smoking and currently smoke or have quit within the past 15 years.  Fecal occult blood test (FOBT) of the stool. You may have this test every year starting at age 36.  Flexible sigmoidoscopy or colonoscopy. You may have a sigmoidoscopy every 5 years or a colonoscopy every 10 years starting at age 91.  Hepatitis C blood test.  Hepatitis B blood test.  Sexually transmitted disease (STD) testing.  Diabetes screening. This is done by checking your blood sugar (glucose) after you have not eaten for a while (fasting). You may have this done every 1-3 years.  Bone density scan. This is done to screen for osteoporosis. You may have this done starting at age 77.  Mammogram. This may be done every 1-2 years. Talk to your health care provider about how often you should have regular mammograms. Talk with your health care provider about your test results, treatment options, and if necessary,  the need for more tests. Vaccines  Your health care provider may recommend certain vaccines, such as:  Influenza vaccine. This is recommended every year.  Tetanus, diphtheria, and acellular pertussis (Tdap, Td) vaccine. You may need a Td booster every 10  years.  Zoster vaccine. You may need this after age 66.  Pneumococcal 13-valent conjugate (PCV13) vaccine. One dose is recommended after age 8.  Pneumococcal polysaccharide (PPSV23) vaccine. One dose is recommended after age 75. Talk to your health care provider about which screenings and vaccines you need and how often you need them. This information is not intended to replace advice given to you by your health care provider. Make sure you discuss any questions you have with your health care provider. Document Released: 06/23/2015 Document Revised: 02/14/2016 Document Reviewed: 03/28/2015 Elsevier Interactive Patient Education  2017 [place] Prevention in the Home Falls can cause injuries. They can happen to people of all ages. There are many things you can do to make your home safe and to help prevent falls. What can I do on the outside of my home?  Regularly fix the edges of walkways and driveways and fix any cracks.  Remove anything that might make you trip as you walk through a door, such as a raised step or threshold.  Trim any bushes or trees on the path to your home.  Use bright outdoor lighting.  Clear any walking paths of anything that might make someone trip, such as rocks or tools.  Regularly check to see if handrails are loose or broken. Make sure that both sides of any steps have handrails.  Any raised decks and porches should have guardrails on the edges.  Have any leaves, snow, or ice cleared regularly.  Use sand or salt on walking paths during winter.  Clean up any spills in your garage right away. This includes oil or grease spills. What can I do in the bathroom?  Use night lights.  Install grab bars by the toilet and in the tub and shower. Do not use towel bars as grab bars.  Use non-skid mats or decals in the tub or shower.  If you need to sit down in the shower, use a plastic, non-slip stool.  Keep the floor dry. Clean up any water that  spills on the floor as soon as it happens.  Remove soap buildup in the tub or shower regularly.  Attach bath mats securely with double-sided non-slip rug tape.  Do not have throw rugs and other things on the floor that can make you trip. What can I do in the bedroom?  Use night lights.  Make sure that you have a light by your bed that is easy to reach.  Do not use any sheets or blankets that are too big for your bed. They should not hang down onto the floor.  Have a firm chair that has side arms. You can use this for support while you get dressed.  Do not have throw rugs and other things on the floor that can make you trip. What can I do in the kitchen?  Clean up any spills right away.  Avoid walking on wet floors.  Keep items that you use a lot in easy-to-reach places.  If you need to reach something above you, use a strong step stool that has a grab bar.  Keep electrical cords out of the way.  Do not use floor polish or wax that makes floors slippery. If you must use  wax, use non-skid floor wax.  Do not have throw rugs and other things on the floor that can make you trip. What can I do with my stairs?  Do not leave any items on the stairs.  Make sure that there are handrails on both sides of the stairs and use them. Fix handrails that are broken or loose. Make sure that handrails are as long as the stairways.  Check any carpeting to make sure that it is firmly attached to the stairs. Fix any carpet that is loose or worn.  Avoid having throw rugs at the top or bottom of the stairs. If you do have throw rugs, attach them to the floor with carpet tape.  Make sure that you have a light switch at the top of the stairs and the bottom of the stairs. If you do not have them, ask someone to add them for you. What else can I do to help prevent falls?  Wear shoes that:  Do not have high heels.  Have rubber bottoms.  Are comfortable and fit you well.  Are closed at the  toe. Do not wear sandals.  If you use a stepladder:  Make sure that it is fully opened. Do not climb a closed stepladder.  Make sure that both sides of the stepladder are locked into place.  Ask someone to hold it for you, if possible.  Clearly mark and make sure that you can see:  Any grab bars or handrails.  First and last steps.  Where the edge of each step is.  Use tools that help you move around (mobility aids) if they are needed. These include:  Canes.  Walkers.  Scooters.  Crutches.  Turn on the lights when you go into a dark area. Replace any light bulbs as soon as they burn out.  Set up your furniture so you have a clear path. Avoid moving your furniture around.  If any of your floors are uneven, fix them.  If there are any pets around you, be aware of where they are.  Review your medicines with your doctor. Some medicines can make you feel dizzy. This can increase your chance of falling. Ask your doctor what other things that you can do to help prevent falls. This information is not intended to replace advice given to you by your health care provider. Make sure you discuss any questions you have with your health care provider. Document Released: 03/23/2009 Document Revised: 11/02/2015 Document Reviewed: 07/01/2014 Elsevier Interactive Patient Education  2017 [employer].

## 2018-12-21 NOTE — Progress Notes (Signed)
Subjective:   Leslie Smith is a 68 y.o. female who presents for Medicare Annual (Subsequent) preventive examination.  Review of Systems:   Cardiac Risk Factors include: advanced age (>55mn, >>41women);dyslipidemia;hypertension;obesity (BMI >30kg/m2)     Objective:     Vitals: BP 132/60 (BP Location: Right Arm, Patient Position: Sitting, Cuff Size: Normal)   Pulse 67   Temp 98 F (36.7 C) (Temporal)   Resp 16   Ht '5\' 5"'$  (1.651 m)   Wt (!) 317 lb 14.4 oz (144.2 kg)   BMI 52.90 kg/m   Body mass index is 52.9 kg/m.  Advanced Directives 12/21/2018 05/04/2018 02/20/2018 02/18/2018 12/17/2017 09/27/2015 08/25/2015  Does Patient Have a Medical Advance Directive? Yes (No Data) No No No Yes No  Type of Advance Directive Living will;Healthcare Power of Attorney - - - - Living will -  Does patient want to make changes to medical advance directive? - No - Patient declined - - - - -  Copy of [place]? No - copy requested - - - - - -  Would patient like information on creating a medical advance directive? - - No - Patient declined No - Patient declined Yes (MAU/Ambulatory/Procedural Areas - Information given) - No - patient declined information    Tobacco Social History   Tobacco Use  Smoking Status Never Smoker  Smokeless Tobacco Never Used     Counseling given: Not Answered   Clinical Intake:  Pre-visit preparation completed: Yes  Pain : 0-10 Pain Score: 5  Pain Type: Chronic pain Pain Location: Generalized Pain Descriptors / Indicators: Aching Pain Onset: More than a month ago Pain Frequency: Constant     Nutritional Status: BMI > 30  Obese Nutritional Risks: None Diabetes: No  How often do you need to have someone help you when you read instructions, pamphlets, or other written materials from your doctor or pharmacy?: 1 - Never  Interpreter Needed?: No  Information entered by :: Jaliza Seifried,LPN  Past Medical History:  Diagnosis Date   . 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  . Bladder incontinence   . Cancer ([facility] 2014   brain tumor, resected  . Cellulitis and abscess of leg 2019   2 episodes this year  . Complication of anesthesia   . DJD (degenerative joint disease) of knee 2018  . Fall   . Fatigue   . History of blood transfusion   . Hypertension   . [place] [facility] [place] [facility]   . [place]   . [place]romuscular disorder ([facility] [place]   multiple sclerosis diagnosed by MRI  . PONV (postoperative nausea and vomiting)    severe  . [facility]ocyt[facility]    Past Surgical History:  Procedure Laterality Date  . ABDOMINAL HYSTERECTOMY  2011   required wound vac for 6 weeks. ovaries had grown attached to her back bone  . BRAIN SURGERY  2015   tumor resection-denies seizures  . BREAST SURGERY Left 2014   biopsy. negative  . HARDWARE REMOVAL Left 05/05/2018   Procedure: HARDWARE REMOVAL- LEFT ANKLE;  Surgeon: MHessie Knows MD;  Location: [facility] ORS;  Service: Orthopedics;  Laterality: Left;  . I&D EXTREMITY Left 08/25/2015   Procedure: IRRIGATION AND DEBRIDEMENT THUMB;  Surgeon: FIran Planas MD;  [facility]  Service: Orthopedics;  Laterality: Left;  . ORIF ANKLE FRACTURE Left 02/20/2018   Procedure: OPEN REDUCTION INTERNAL FIXATION (ORIF) ANKLE FRACTURE;  Surgeon: MHessie Knows MD;  Location: [facility] ORS;  Service: Orthopedics;  Laterality: Left;   Family History  Problem Relation Age of Onset  . Heart disease Father   . Cancer Father    Social History   Socioeconomic History  . Marital status: Single    Spouse name: Not on file  . Number of children: Not on file  . Years of education: high school, some college   . Highest education level: High school graduate  Occupational History  . Occupation: Training and development officer    Comment: disabled d/t MS  Social Needs  . Financial resource strain: Not very hard  . Food insecurity    Worry: Never true    Inability: Never true  . Transportation needs    Medical: No     Non-medical: No  Tobacco Use  . Smoking status: Never Smoker  . Smokeless tobacco: Never Used  Substance and Sexual Activity  . Alcohol use: Yes    Comment: occassional  . Drug use: Yes    Types: Marijuana    Comment: uses for MS  . Sexual activity: Not on file  Lifestyle  . Physical activity    Days per week: 0 days    Minutes per session: 0 min  . Stress: Not at all  Relationships  . Social connections    Talks on phone: More than three times a week    Gets together: More than three times a week    Attends religious service: More than 4 times per year    Active member of club or organization: No    Attends meetings of clubs or organizations: Never    Relationship status: Never married  Other Topics Concern  . Not on file  Social History Narrative   Tai chi at [place] Encounter Medications as of 12/21/2018  Medication Sig  . Biotin 10 MG CAPS Take by mouth.  . cholecalciferol (VITAMIN D3) 10 MCG (400 UNIT) TABS tablet Take 800 Units by mouth at bedtime.  . hydrochlorothiazide (HYDRODIURIL) 25 MG tablet Take 1 tablet (25 mg total) by mouth daily. (Patient taking differently: Take 25 mg by mouth at bedtime. )  . ibuprofen (IBU) 800 MG tablet TAKE ONE TABLET BY MOUTH EVERY 8 HOURS AS NEEDED FOR MILD OR MODERATEPAIN (Patient taking differently: Take 800 mg by mouth every 8 (eight) hours as needed for moderate pain. )  . losartan (COZAAR) 100 MG tablet Take 1 tablet (100 mg total) by mouth daily. (Patient taking differently: Take 100 mg by mouth at bedtime. )  . Melatonin 3 MG TABS Take 3-6 mg by mouth at bedtime.  . niacin 100 MG tablet Take 100 mg by mouth at bedtime.  . [DISCONTINUED] oxyCODONE (ROXICODONE) 5 MG immediate release tablet Take 1 tablet (5 mg total) by mouth every 4 (four) hours as needed for severe pain. (Patient not taking: Reported on 12/21/2018)   No facility-administered encounter medications on file as of 12/21/2018.     Activities of Daily  Living In your present state of health, do you have any difficulty performing the following activities: 12/21/2018 05/04/2018  Hearing? N -  Vision? N -  Comment eyeglasses -  Difficulty concentrating or making decisions? N -  Walking or climbing stairs? Y Y  Comment - left ankle  Dressing or bathing? N -  Doing errands, shopping? N N  Preparing Food and eating ? N -  Using the Toilet? N -  In the past six months, have you accidently leaked urine? Y -  Comment doesnt wear protection -  Do you have problems with loss of bowel control? N -  Managing your Medications? N -  Managing your Finances? N -  Housekeeping or managing your Housekeeping? N -  Some recent data might be hidden    Patient Care Team: Guadalupe Maple, MD as PCP - General (Family Medicine)    Assessment:   This is a routine wellness examination for [place].  Exercise Activities and Dietary recommendations Current Exercise Habits: The patient does not participate in regular exercise at present, Exercise limited by: None identified  Goals    . DIET - INCREASE WATER INTAKE     Recommend drinking at least 6-8 glasses of water a day        Fall Risk: Fall Risk  12/21/2018 12/17/2017 07/09/2017 03/19/2017 01/29/2017  Falls in the past year? 1 Yes No No No  Number falls in past yr: 0 1 - - -  Injury with Fall? 1 Yes - - -  Risk Factor Category  - High Fall Risk - - -  Risk for fall due to : Impaired balance/gait;Other (Comment) - - - -  Risk for fall due to: Comment MS - - - -  Follow up - Falls prevention discussed - - -    FALL RISK PREVENTION PERTAINING TO THE HOME:  Any stairs in or around the home? Yes  stairs and a ramp.  If so, are there any without handrails? No   Home free of loose throw rugs in walkways, pet beds, electrical cords, etc? Yes  Adequate lighting in your home to reduce risk of falls? Yes   ASSISTIVE DEVICES UTILIZED TO PREVENT FALLS:  Life alert? No  Use of a cane, walker or w/c?  Yes  Grab bars in the bathroom? No  Shower chair or bench in shower? No  Elevated toilet seat or a handicapped toilet? Yes   DME ORDERS:  DME order needed?  No   TIMED UP AND GO:  Was the test performed? Yes .  Length of time to ambulate 10 feet: 12 sec.   GAIT:  Appearance of gait:  Gait slow and steady without the use of an assistive device.  Education: Fall risk prevention has been discussed.  Intervention(s) required? No   DME/home health order needed?  No    Depression Screen PHQ 2/9 Scores 12/21/2018 12/17/2017 07/09/2017 01/29/2017  PHQ - 2 Score 0 0 0 0     Cognitive Function     6CIT Screen 12/21/2018 12/17/2017  What Year? 0 points 0 points  What month? 0 points 0 points  What time? 0 points 0 points  Count back from 20 0 points 0 points  Months in reverse 0 points 0 points  Repeat phrase 0 points 2 points  Total Score 0 2    Immunization History  Administered Date(s) Administered  . Pneumococcal Polysaccharide-23 02/21/2018  . Td 03/04/2007, 08/12/2015    Qualifies for Shingles Vaccine? Yes  Zostavax completed n/a. Due for Shingrix. Education has been provided regarding the importance of this vaccine. Pt has been advised to call insurance company to determine out of pocket expense. Advised may also receive vaccine at local pharmacy or Health Dept. Verbalized acceptance and understanding.  Tdap: up to date   Flu Vaccine: due 02/2019  Pneumococcal Vaccine: up to date   Screening Tests Health Maintenance  Topic Date Due  . DEXA SCAN  01/06/2016  . MAMMOGRAM  03/28/2016  . COLONOSCOPY  04/24/2017  . INFLUENZA VACCINE  01/09/2019  .  PNA vac Low Risk Adult (2 of 2 - PCV13) 02/22/2019  . TETANUS/TDAP  08/11/2025  . Hepatitis C Screening  Completed    Cancer Screenings:  Colorectal Screening: Completed 11/12/2017. Repeat every 10 years  Mammogram: ordered   Bone Density: ordered.  Lung Cancer Screening: (Low Dose CT Chest recommended if Age 5-80  years, 30 pack-year currently smoking OR have quit w/in 15years.) does not qualify.    Additional Screening:  Hepatitis C Screening: does qualify; Completed 10/26/2015  Vision Screening: Recommended annual ophthalmology exams for early detection of glaucoma and other disorders of the eye. Is the patient up to date with their annual eye exam?  Yes  Who is the provider or what is the name of the office in which the pt attends annual eye exams? [facility]   Dental Screening: Recommended annual dental exams for proper oral hygiene  Community Resource Referral:  CRR required this visit?  No       Plan:  I have personally reviewed and addressed the Medicare Annual Wellness questionnaire and have noted the following in the patient's chart:  A. Medical and social history B. Use of alcohol, tobacco or illicit drugs  C. Current medications and supplements D. Functional ability and status E.  Nutritional status F.  Physical activity G. Advance directives H. List of other physicians I.  Hospitalizations, surgeries, and ER visits in previous 12 months J.  [place] such as hearing and vision if needed, cognitive and depression L. Referrals and appointments   In addition, I have reviewed and discussed with patient certain preventive protocols, quality metrics, and best practice recommendations. A written personalized care plan for preventive services as well as general preventive health recommendations were provided to patient. Nurse Health Advisor  Signed,    Gilbert, Caro Hight, [place]  8/93/7342 Nurse Health Advisor   Nurse Notes: patient complained of some neck pain, ongoing since she broke her foot last year. She states it is sore and feels a little swollen on left side. Advised to try some stretches and schedule a follow up visit with MD, patient to call if it worsens or doesn't improve.

## 2018-12-22 LAB — COMPREHENSIVE METABOLIC PANEL
ALT: 15 IU/L (ref 0–32)
AST: 16 IU/L (ref 0–40)
Albumin/Globulin Ratio: 1.7 (ref 1.2–2.2)
Albumin: 4.2 g/dL (ref 3.8–4.8)
Alkaline Phosphatase: 86 IU/L (ref 39–117)
BUN/Creatinine Ratio: 22 (ref 12–28)
BUN: 16 mg/dL (ref 8–27)
Bilirubin Total: 0.4 mg/dL (ref 0.0–1.2)
CO2: 23 mmol/L (ref 20–29)
Calcium: 8.8 mg/dL (ref 8.7–10.3)
Chloride: 104 mmol/L (ref 96–106)
Creatinine, Ser: 0.74 mg/dL (ref 0.57–1.00)
GFR calc Af Amer: 97 mL/min/{1.73_m2} (ref >59)
GFR calc non Af Amer: 84 mL/min/{1.73_m2} (ref >59)
Globulin, Total: 2.5 g/dL (ref 1.5–4.5)
Glucose: 122 mg/dL — ABNORMAL HIGH (ref 65–99)
Potassium: 4 mmol/L (ref 3.5–5.2)
Sodium: 143 mmol/L (ref 134–144)
Total Protein: 6.7 g/dL (ref 6.0–8.5)

## 2018-12-22 LAB — CBC WITH DIFFERENTIAL/PLATELET
Basophils Absolute: 0.1 10*3/uL (ref 0.0–0.2)
Basos: 1 %
EOS (ABSOLUTE): 0.3 10*3/uL (ref 0.0–0.4)
Eos: 3 %
Hematocrit: 36.6 % (ref 34.0–46.6)
Hemoglobin: 12.3 g/dL (ref 11.1–15.9)
Immature Grans (Abs): 0 10*3/uL (ref 0.0–0.1)
Immature Granulocytes: 0 %
Lymphocytes Absolute: 1.8 10*3/uL (ref 0.7–3.1)
Lymphs: 19 %
MCH: 29.5 pg (ref 26.6–33.0)
MCHC: 33.6 g/dL (ref 31.5–35.7)
MCV: 88 fL (ref 79–97)
Monocytes Absolute: 0.5 10*3/uL (ref 0.1–0.9)
Monocytes: 6 %
Neutrophils Absolute: 6.7 10*3/uL (ref 1.4–7.0)
Neutrophils: 71 %
Platelets: 49 10*3/uL — CL (ref 150–450)
RBC: 4.17 x10E6/uL (ref 3.77–5.28)
RDW: 14 % (ref 11.7–15.4)
WBC: 9.3 10*3/uL (ref 3.4–10.8)

## 2018-12-22 LAB — LIPID PANEL W/O CHOL/HDL RATIO
Cholesterol, Total: 237 mg/dL — ABNORMAL HIGH (ref 100–199)
HDL: 61 mg/dL (ref >39)
LDL Calculated: 139 mg/dL — ABNORMAL HIGH (ref 0–99)
Triglycerides: 187 mg/dL — ABNORMAL HIGH (ref 0–149)
VLDL Cholesterol Cal: 37 mg/dL (ref 5–40)

## 2018-12-22 LAB — TSH: TSH: 1.96 u[IU]/mL (ref 0.450–4.500)

## 2018-12-24 ENCOUNTER — Other Ambulatory Visit: Payer: Self-pay | Admitting: Family Medicine

## 2018-12-24 DIAGNOSIS — D696 Thrombocytopenia, unspecified: Secondary | ICD-10-CM

## 2018-12-24 NOTE — Progress Notes (Signed)
Phone call discussed with patient low platelet count work-up for thrombocytopenia will refer back as platelet count dropping.

## 2018-12-28 ENCOUNTER — Other Ambulatory Visit: Payer: Self-pay

## 2018-12-28 ENCOUNTER — Ambulatory Visit (INDEPENDENT_AMBULATORY_CARE_PROVIDER_SITE_OTHER): Payer: Medicare Other | Admitting: Family Medicine

## 2018-12-28 ENCOUNTER — Encounter: Payer: Self-pay | Admitting: Family Medicine

## 2018-12-28 DIAGNOSIS — M170 Bilateral primary osteoarthritis of knee: Secondary | ICD-10-CM | POA: Diagnosis not present

## 2018-12-28 DIAGNOSIS — G35 Multiple sclerosis: Secondary | ICD-10-CM | POA: Diagnosis not present

## 2018-12-28 DIAGNOSIS — Z7189 Other specified counseling: Secondary | ICD-10-CM | POA: Diagnosis not present

## 2018-12-28 DIAGNOSIS — I10 Essential (primary) hypertension: Secondary | ICD-10-CM

## 2018-12-28 DIAGNOSIS — D696 Thrombocytopenia, unspecified: Secondary | ICD-10-CM

## 2018-12-28 MED ORDER — HYDROCHLOROTHIAZIDE 25 MG PO TABS: 25.0000 mg | ORAL_TABLET | Freq: Every day | ORAL | 4 refills | Status: DC

## 2018-12-28 MED ORDER — LOSARTAN POTASSIUM 100 MG PO TABS: 100.0000 mg | ORAL_TABLET | Freq: Every day | ORAL | 4 refills | Status: DC

## 2018-12-28 MED ORDER — IBUPROFEN 800 MG PO TABS: 800.0000 mg | ORAL_TABLET | Freq: Three times a day (TID) | ORAL | 6 refills | Status: AC | PRN

## 2018-12-28 NOTE — Progress Notes (Signed)
BP 132/80    Subjective:    Patient ID: Leslie Smith, female    DOB: [birth year], 68 y.o.   MRN: [id number]  HPI: Leslie Smith is a 68 y.o. female  Med check Discussed with patient doing well ankle has finally healed some from surgeries and infections.  Still having some moderate pain.  Takes ibuprofen for that and her knees. MS stable. Has appointment with hematology coming up at the end of the month. Reviewed advance care directives patient getting ready to do that  Relevant past medical, surgical, family and social history reviewed and updated as indicated. Interim medical history since our last visit reviewed. Allergies and medications reviewed and updated.  Review of Systems  Constitutional: Negative.   HENT: Negative.   Eyes: Negative.   Respiratory: Negative.   Cardiovascular: Negative.   Gastrointestinal: Negative.   Endocrine: Negative.   Genitourinary: Negative.   Musculoskeletal: Negative.   Skin: Negative.   Allergic/Immunologic: Negative.   Neurological: Negative.   Hematological: Negative.   Psychiatric/Behavioral: Negative.     Per HPI unless specifically indicated above     Objective:    BP 132/80   Wt Readings from Last 3 Encounters:  12/21/18 (!) 317 lb 14.4 oz (144.2 kg)  05/05/18 (!) 313 lb 0.9 oz (142 kg)  05/04/18 (!) 313 lb (142 kg)    Physical Exam  Results for orders placed or performed in visit on 12/21/18  Microscopic Examination   URINE  Result Value Ref Range   WBC, UA None seen 0 - 5 /hpf   RBC 0-2 0 - 2 /hpf   Epithelial Cells (non renal) 0-10 0 - 10 /hpf   Bacteria, UA Few (A) None seen/Few  TSH  Result Value Ref Range   TSH 1.960 0.450 - 4.500 uIU/mL  Lipid Panel w/o Chol/HDL Ratio  Result Value Ref Range   Cholesterol, Total 237 (H) 100 - 199 mg/dL   Triglycerides 187 (H) 0 - 149 mg/dL   HDL 61 >39 mg/dL   VLDL Cholesterol Cal 37 5 - 40 mg/dL   LDL Calculated 139 (H) 0 - 99 mg/dL  Comp Met (CMET)  Result Value Ref  Range   Glucose 122 (H) 65 - 99 mg/dL   BUN 16 8 - 27 mg/dL   Creatinine, Ser 0.74 0.57 - 1.00 mg/dL   GFR calc non Af Amer 84 >59 mL/min/1.73   GFR calc Af Amer 97 >59 mL/min/1.73   BUN/Creatinine Ratio 22 12 - 28   Sodium 143 134 - 144 mmol/L   Potassium 4.0 3.5 - 5.2 mmol/L   Chloride 104 96 - 106 mmol/L   CO2 23 20 - 29 mmol/L   Calcium 8.8 8.7 - 10.3 mg/dL   Total Protein 6.7 6.0 - 8.5 g/dL   Albumin 4.2 3.8 - 4.8 g/dL   Globulin, Total 2.5 1.5 - 4.5 g/dL   Albumin/Globulin Ratio 1.7 1.2 - 2.2   Bilirubin Total 0.4 0.0 - 1.2 mg/dL   Alkaline Phosphatase 86 39 - 117 IU/L   AST 16 0 - 40 IU/L   ALT 15 0 - 32 IU/L  CBC with Differential  Result Value Ref Range   WBC 9.3 3.4 - 10.8 x10E3/uL   RBC 4.17 3.77 - 5.28 x10E6/uL   Hemoglobin 12.3 11.1 - 15.9 g/dL   Hematocrit 36.6 34.0 - 46.6 %   MCV 88 79 - 97 fL   MCH 29.5 26.6 - 33.0 pg   MCHC 33.6 31.5 -  35.7 g/dL   RDW 14.0 11.7 - 15.4 %   Platelets 49 (LL) 150 - 450 x10E3/uL   Neutrophils 71 Not Estab. %   Lymphs 19 Not Estab. %   Monocytes 6 Not Estab. %   Eos 3 Not Estab. %   Basos 1 Not Estab. %   Neutrophils Absolute 6.7 1.4 - 7.0 x10E3/uL   Lymphocytes Absolute 1.8 0.7 - 3.1 x10E3/uL   Monocytes Absolute 0.5 0.1 - 0.9 x10E3/uL   EOS (ABSOLUTE) 0.3 0.0 - 0.4 x10E3/uL   Basophils Absolute 0.1 0.0 - 0.2 x10E3/uL   Immature Granulocytes 0 Not Estab. %   Immature Grans (Abs) 0.0 0.0 - 0.1 x10E3/uL   Hematology Comments: Note:   Urinalysis, Routine w reflex microscopic  Result Value Ref Range   Specific Gravity, UA 1.025 1.005 - 1.030   pH, UA 5.5 5.0 - 7.5   Color, UA Yellow Yellow   Appearance Ur Clear Clear   Leukocytes,UA Negative Negative   Protein,UA 2+ (A) Negative/Trace   Glucose, UA Negative Negative   Ketones, UA Negative Negative   RBC, UA 1+ (A) Negative   Bilirubin, UA Negative Negative   Urobilinogen, Ur 0.2 0.2 - 1.0 mg/dL   Nitrite, UA Negative Negative   Microscopic Examination See below:        Assessment & Plan:   Problem List Items Addressed This Visit      Cardiovascular and Mediastinum   Essential hypertension    The current medical regimen is effective;  continue present plan and medications.       Relevant Medications   losartan (COZAAR) 100 MG tablet   hydrochlorothiazide (HYDRODIURIL) 25 MG tablet     Nervous and Auditory   Multiple sclerosis (HCC)    The current medical regimen is effective;  continue present plan and medications.         Musculoskeletal and Integument   DJD (degenerative joint disease) of knee    Discussed using ibuprofen appropriately renal function stable      Relevant Medications   ibuprofen (IBU) 800 MG tablet     Other   Thrombocytopenia (HCC)    Has an appointment with hematology coming up next week      Advanced care planning/counseling discussion    A voluntary discussion about advanced care planning including explanation and discussion of advanced directives was extentively discussed with the patient.  Explained about the healthcare proxy and living will was reviewed and packet with forms with expiration of how to fill them out was given.  Time spent: Encounter 16+ min individuals present: Patient         Telemedicine using audio/video telecommunications for a synchronous communication visit. Today's visit due to COVID-19 isolation precautions I connected with and verified that I am speaking with the correct person using two identifiers.   I discussed the limitations, risks, security and privacy concerns of performing an evaluation and management service by telecommunication and the availability of in person appointments. I also discussed with the patient that there may be a patient responsible charge related to this service. The patient expressed understanding and agreed to proceed. The patient's location is home. I am at home.   I discussed the assessment and treatment plan with the patient. The patient was provided  an opportunity to ask questions and all were answered. The patient agreed with the plan and demonstrated an understanding of the instructions.   The patient was advised to call back or seek an in-person evaluation if  the symptoms worsen or if the condition fails to improve as anticipated.   I provided 21+ minutes of time during this encounter.  Follow up plan: Return in about 6 months (around 06/30/2019) for BMP.

## 2018-12-28 NOTE — Assessment & Plan Note (Signed)
The current medical regimen is effective;  continue present plan and medications.  

## 2018-12-28 NOTE — Assessment & Plan Note (Signed)
A voluntary discussion about advanced care planning including explanation and discussion of advanced directives was extentively discussed with the patient.  Explained about the healthcare proxy and living will was reviewed and packet with forms with expiration of how to fill them out was given.  Time spent: Encounter 16+ min individuals present: Patient 

## 2018-12-28 NOTE — Assessment & Plan Note (Signed)
Discussed using ibuprofen appropriately renal function stable

## 2018-12-28 NOTE — Assessment & Plan Note (Signed)
Has an appointment with hematology coming up next week

## 2019-01-01 ENCOUNTER — Other Ambulatory Visit: Payer: Self-pay

## 2019-01-04 ENCOUNTER — Inpatient Hospital Stay: Payer: Medicare Other | Attending: Oncology | Admitting: Oncology

## 2019-01-04 ENCOUNTER — Other Ambulatory Visit: Payer: Self-pay

## 2019-01-04 ENCOUNTER — Encounter: Payer: Self-pay | Admitting: Oncology

## 2019-01-04 ENCOUNTER — Inpatient Hospital Stay: Payer: Medicare Other

## 2019-01-04 VITALS — BP 166/76 | HR 74 | Temp 99.7°F | Resp 20 | Ht 59.25 in | Wt 318.5 lb

## 2019-01-04 DIAGNOSIS — Z90710 Acquired absence of both cervix and uterus: Secondary | ICD-10-CM | POA: Diagnosis not present

## 2019-01-04 DIAGNOSIS — G35 Multiple sclerosis: Secondary | ICD-10-CM

## 2019-01-04 DIAGNOSIS — D696 Thrombocytopenia, unspecified: Secondary | ICD-10-CM

## 2019-01-04 DIAGNOSIS — I10 Essential (primary) hypertension: Secondary | ICD-10-CM

## 2019-01-04 DIAGNOSIS — Z809 Family history of malignant neoplasm, unspecified: Secondary | ICD-10-CM | POA: Diagnosis not present

## 2019-01-04 LAB — CBC WITH DIFFERENTIAL/PLATELET
Abs Immature Granulocytes: 0.05 10*3/uL (ref 0.00–0.07)
Basophils Absolute: 0.1 10*3/uL (ref 0.0–0.1)
Basophils Relative: 1 %
Eosinophils Absolute: 0.3 10*3/uL (ref 0.0–0.5)
Eosinophils Relative: 3 %
HCT: 35.5 % — ABNORMAL LOW (ref 36.0–46.0)
Hemoglobin: 11.5 g/dL — ABNORMAL LOW (ref 12.0–15.0)
Immature Granulocytes: 1 %
Lymphocytes Relative: 20 %
Lymphs Abs: 1.8 10*3/uL (ref 0.7–4.0)
MCH: 28.5 pg (ref 26.0–34.0)
MCHC: 32.4 g/dL (ref 30.0–36.0)
MCV: 87.9 fL (ref 80.0–100.0)
Monocytes Absolute: 0.5 10*3/uL (ref 0.1–1.0)
Monocytes Relative: 6 %
Neutro Abs: 6.7 10*3/uL (ref 1.7–7.7)
Neutrophils Relative %: 69 %
Platelets: 62 10*3/uL — ABNORMAL LOW (ref 150–400)
RBC: 4.04 MIL/uL (ref 3.87–5.11)
RDW: 14.6 % (ref 11.5–15.5)
WBC: 9.4 10*3/uL (ref 4.0–10.5)
nRBC: 0 % (ref 0.0–0.2)

## 2019-01-04 LAB — COMPREHENSIVE METABOLIC PANEL
ALT: 14 U/L (ref 0–44)
AST: 15 U/L (ref 15–41)
Albumin: 3.7 g/dL (ref 3.5–5.0)
Alkaline Phosphatase: 70 U/L (ref 38–126)
Anion gap: 9 (ref 5–15)
BUN: 15 mg/dL (ref 8–23)
CO2: 28 mmol/L (ref 22–32)
Calcium: 9 mg/dL (ref 8.9–10.3)
Chloride: 103 mmol/L (ref 98–111)
Creatinine, Ser: 0.83 mg/dL (ref 0.44–1.00)
GFR calc Af Amer: >60 mL/min (ref >60)
GFR calc non Af Amer: >60 mL/min (ref >60)
Glucose, Bld: 97 mg/dL (ref 70–99)
Potassium: 3.8 mmol/L (ref 3.5–5.1)
Sodium: 140 mmol/L (ref 135–145)
Total Bilirubin: 0.6 mg/dL (ref 0.3–1.2)
Total Protein: 7.3 g/dL (ref 6.5–8.1)

## 2019-01-04 LAB — TECHNOLOGIST SMEAR REVIEW

## 2019-01-04 LAB — VITAMIN B12: Vitamin B-12: 219 pg/mL (ref 180–914)

## 2019-01-04 LAB — PROTIME-INR
INR: 0.9 (ref 0.8–1.2)
Prothrombin Time: 12.3 seconds (ref 11.4–15.2)

## 2019-01-04 LAB — FOLATE: Folate: 11.7 ng/mL (ref >5.9)

## 2019-01-04 NOTE — Progress Notes (Signed)
Patient referred today for thrombocytopenia.  She was seen in this office in 2016 for thrombocytopenia by Dr. Linus Mako.  Dr. Lavone Neri recommended a bone marrow biopsy but she refused and states she will still refuse today.

## 2019-01-05 ENCOUNTER — Telehealth: Payer: Self-pay

## 2019-01-05 ENCOUNTER — Other Ambulatory Visit: Payer: Self-pay | Admitting: *Deleted

## 2019-01-05 ENCOUNTER — Encounter: Payer: Self-pay | Admitting: Oncology

## 2019-01-05 DIAGNOSIS — G35 Multiple sclerosis: Secondary | ICD-10-CM | POA: Diagnosis not present

## 2019-01-05 DIAGNOSIS — D696 Thrombocytopenia, unspecified: Secondary | ICD-10-CM

## 2019-01-05 DIAGNOSIS — Z809 Family history of malignant neoplasm, unspecified: Secondary | ICD-10-CM | POA: Diagnosis not present

## 2019-01-05 DIAGNOSIS — I10 Essential (primary) hypertension: Secondary | ICD-10-CM | POA: Diagnosis not present

## 2019-01-05 LAB — HIV ANTIBODY (ROUTINE TESTING W REFLEX): HIV Screen 4th Generation wRfx: NONREACTIVE

## 2019-01-05 LAB — HEPATITIS C ANTIBODY: HCV Ab: <0.1 s/co ratio (ref 0.0–0.9)

## 2019-01-05 NOTE — Progress Notes (Signed)
Hematology/Oncology Consult note [facility] Telephone:([phone] [phone]  Patient Care Team: Guadalupe Maple, MD as PCP - General (Family Medicine)   Name of the patient: Leslie Smith  [id number]  May 24, 1951    Reason for referral-thrombocytopenia   Referring physician-Dr. Luvenia Heller  Date of visit: 01/05/19   History of presenting illness- Patient is a 68 year old female with a past medical history significant for hypertension, multiple sclerosis who was referred to [place] for thrombocytopenia.  Of note patient has moderate thrombocytopenia at least dating back for the last 5 years.  Her CBC in November 2015 showed white count of 6.7, H&H of 12.8/40.1 and a platelet count of 49.  Since then her platelet count has gone up and down and usually remains between 40s to 60s.  On one occasion in 2017 it did drop down to 19.  Most recent platelet count from 01/04/2019 showed white count of 9.4, H&H of 11.5/35.5 and a platelet count of 62.  Patient has undergone hysterectomy in 2011 as well as ankle surgery in 2019 despite her low platelet count and did not have any significant bleeding issues.  Currently patient denies any symptoms of bleeding or bruising.  Appetite and weight are stable.  Denies any unintentional weight loss.  Denies any known gum bleeds or nosebleeds.  Denies any easy bruising  ECOG PS- 0  Pain scale- 0   Review of systems- Review of Systems  Constitutional: Negative for chills, fever, malaise/fatigue and weight loss.  HENT: Negative for congestion, ear discharge and nosebleeds.   Eyes: Negative for blurred vision.  Respiratory: Negative for cough, hemoptysis, sputum production, shortness of breath and wheezing.   Cardiovascular: Negative for chest pain, palpitations, orthopnea and claudication.  Gastrointestinal: Negative for abdominal pain, blood in stool, constipation, diarrhea, heartburn, melena, nausea and vomiting.  Genitourinary:  Negative for dysuria, flank pain, frequency, hematuria and urgency.  Musculoskeletal: Negative for back pain, joint pain and myalgias.  Skin: Negative for rash.  Neurological: Negative for dizziness, tingling, focal weakness, seizures, weakness and headaches.  Endo/Heme/Allergies: Does not bruise/bleed easily.  Psychiatric/Behavioral: Negative for depression and suicidal ideas. The patient does not have insomnia.     Allergies  Allergen Reactions  . Watermelon [Citrullus Vulgaris] Diarrhea    Severe diarrhea within 2 minutes of eating  . Adhesive [Tape]   . Other Nausea And Vomiting    general anesthesia - deathly ill   . Atorvastatin Other (See Comments)    Joint pain  . Meloxicam Nausea Only    Patient Active Problem List   Diagnosis Date Noted  . S/P ORIF (open reduction internal fixation) fracture 02/20/2018  . DJD (degenerative joint disease) of knee 01/02/2017  . Advanced care planning/counseling discussion 01/02/2017  . Thrombocytopenia ([place]) 10/30/2015  . Multiple sclerosis ([place])   . Essential hypertension   . Hyperlipidemia      Past Medical History:  Diagnosis Date  . 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 Bladder incontinence   . Cancer [facility]) 2014   brain tumor, resected  . Cellulitis and abscess of leg 2019   2 episodes this year  . Complication of anesthesia   . DJD (degenerative joint disease) of knee 2018  . Fall   . Fatigue   . History of blood transfusion   . Hypertension   . MS (multiple sclerosis) ([place])   . Myelitis ([place])   . Neuromuscular disorder ([place]) 2001   multiple sclerosis diagnosed by MRI  . PONV (postoperative nausea  and vomiting)    severe  . Thrombocytopenia ([place])      Past Surgical History:  Procedure Laterality Date  . ABDOMINAL HYSTERECTOMY  2011   required wound vac for 6 weeks. ovaries had grown attached to her back bone  . BRAIN SURGERY  2015   tumor resection-denies seizures  . BREAST SURGERY Left 2014   biopsy.  negative  . HARDWARE REMOVAL Left 05/05/2018   Procedure: HARDWARE REMOVAL- LEFT ANKLE;  Surgeon: Hessie Knows, MD;  Location: [facility] ORS;  Service: Orthopedics;  Laterality: Left;  . I&D EXTREMITY Left 08/25/2015   Procedure: IRRIGATION AND DEBRIDEMENT THUMB;  Surgeon: Iran Planas, MD;  Location: [facility];  Service: Orthopedics;  Laterality: Left;  . ORIF ANKLE FRACTURE Left 02/20/2018   Procedure: OPEN REDUCTION INTERNAL FIXATION (ORIF) ANKLE FRACTURE;  Surgeon: Hessie Knows, MD;  Location: [facility] ORS;  Service: Orthopedics;  Laterality: Left;    Social History   Socioeconomic History  . Marital status: Single    Spouse name: Not on file  . Number of children: Not on file  . Years of education: high school, some college   . Highest education level: High school graduate  Occupational History  . Occupation: Training and development officer    Comment: disabled d/t MS  Social Needs  . Financial resource strain: Not very hard  . Food insecurity    Worry: Never true    Inability: Never true  . Transportation needs    Medical: No    Non-medical: No  Tobacco Use  . Smoking status: Never Smoker  . Smokeless tobacco: Never Used  Substance and Sexual Activity  . Alcohol use: Yes    Comment: occassional  . Drug use: Yes    Types: Marijuana    Comment: uses for MS  . Sexual activity: Not on file  Lifestyle  . Physical activity    Days per week: 0 days    Minutes per session: 0 min  . Stress: Not at all  Relationships  . Social connections    Talks on phone: More than three times a week    Gets together: More than three times a week    Attends religious service: More than 4 times per year    Active member of club or organization: No    Attends meetings of clubs or organizations: Never    Relationship status: Never married  . Intimate partner violence    Fear of current or ex partner: No    Emotionally abused: No    Physically abused: No    Forced sexual activity: No  Other Topics Concern  . Not on file   Social History Narrative   Tai chi at [employer]      Family History  Problem Relation Age of Onset  . Heart disease Father   . Cancer Father      Current Outpatient Medications:  .  Biotin 10 MG CAPS, Take by mouth., Disp: , Rfl:  .  cholecalciferol (VITAMIN D3) 10 MCG (400 UNIT) TABS tablet, Take 800 Units by mouth at bedtime., Disp: , Rfl:  .  hydrochlorothiazide (HYDRODIURIL) 25 MG tablet, Take 1 tablet (25 mg total) by mouth daily., Disp: 90 tablet, Rfl: 4 .  ibuprofen (IBU) 800 MG tablet, Take 1 tablet (800 mg total) by mouth every 8 (eight) hours as needed for moderate pain., Disp: 30 tablet, Rfl: 6 .  losartan (COZAAR) 100 MG tablet, Take 1 tablet (100 mg total) by mouth daily., Disp: 90 tablet, Rfl: 4 .  Melatonin 3 MG TABS, Take 3-6 mg by mouth at bedtime., Disp: , Rfl:  .  niacin 100 MG tablet, Take 100 mg by mouth at bedtime., Disp: , Rfl:    Physical exam:  Vitals:   01/04/19 1000  BP: (!) 166/76  Pulse: 74  Resp: 20  Temp: 99.7 F (37.6 C)  Weight: (!) 318 lb 8 oz (144.5 kg)  Height: 4' 11.25" (1.505 m)   Physical Exam Constitutional:      General: She is not in acute distress. HENT:     Head: Normocephalic and atraumatic.  Eyes:     Pupils: Pupils are equal, round, and reactive to light.  Neck:     Musculoskeletal: Normal range of motion.  Cardiovascular:     Rate and Rhythm: Normal rate and regular rhythm.     Heart sounds: Normal heart sounds.  Pulmonary:     Effort: Pulmonary effort is normal.     Breath sounds: Normal breath sounds.  Abdominal:     General: Bowel sounds are normal.     Palpations: Abdomen is soft.  Skin:    General: Skin is warm and dry.     Findings: No bruising or erythema.  Neurological:     Mental Status: She is alert and oriented to person, place, and time.        CMP Latest Ref Rng & Units 01/04/2019  Glucose 70 - 99 mg/dL 97  BUN 8 - 23 mg/dL 15  Creatinine 0.44 - 1.00 mg/dL 0.83  Sodium 135 - 145 mmol/L 140   Potassium 3.5 - 5.1 mmol/L 3.8  Chloride 98 - 111 mmol/L 103  CO2 22 - 32 mmol/L 28  Calcium 8.9 - 10.3 mg/dL 9.0  Total Protein 6.5 - 8.1 g/dL 7.3  Total Bilirubin 0.3 - 1.2 mg/dL 0.6  Alkaline Phos 38 - 126 U/L 70  AST 15 - 41 U/L 15  ALT 0 - 44 U/L 14   CBC Latest Ref Rng & Units 01/04/2019  WBC 4.0 - 10.5 K/uL 9.4  Hemoglobin 12.0 - 15.0 g/dL 11.5(L)  Hematocrit 36.0 - 46.0 % 35.5(L)  Platelets 150 - 400 K/uL 62(L)    Assessment and plan- Patient is a 68 y.o. 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  She does have moderate chronic isolated thrombocytopenia in the absence of other cytopenias dating back to at least 2015.  Overall her platelet counts have remained stable between 40s to 60s.  I suspect given her isolated thrombocytopenia this is likely secondary to ITP.  Today I will check a CBC with differential, smear review, B12, folate, HIV and hepatitis C.  I will have a video visit with the patient in 2 weeks time to discuss the results of her tests.  As long as patient's platelet count remains greater than 30 she does not require treatment for ITP.  Patient knows to call us if she develops any acute bleeding bruising or petechiae in her extremities.  I have reviewed her medication list and there are no obvious offending drugs that would explain her thrombocytopenia.  Bone marrow biopsy would not be indicated at this time given that she has isolated thrombocytopenia which is chronic and stable in the absence of other cytopenias.  I explained the natural course of ITP and consideration for steroid treatment if her platelets fall less than 30.  Patient verbalized understanding   Thank you for this kind referral and the opportunity  to participate in the care of this patient   Visit Diagnosis 1. Thrombocytopenia ([place])     Dr. Randa Evens, MD, MPH [facility] [phone]  01/05/2019  8:55 AM

## 2019-01-05 NOTE — Telephone Encounter (Signed)
Attempted to contact patient via Home and Cell. Unable to leave a message on both.   This is regarding low B12 level and , per Dr. Janese Banks, patient is to start oral B12 1000 MCG daily.

## 2019-01-05 NOTE — Telephone Encounter (Signed)
Contacted patient and made aware of Vitamin B12 result. Patient also informed, per Dr. Janese Banks, she is to start taking Oral B12 1000 MCG daily. Patient confirmed understanding and denies any further questions or concerns.

## 2019-01-05 NOTE — Telephone Encounter (Signed)
-----   Message from Luella Cook, RN sent at 01/05/2019 11:59 AM EDT -----  ----- Message ----- From: Sindy Guadeloupe, MD Sent: 01/04/2019   5:01 PM EDT To: Luella Cook, RN  b12 levels are <240. She is b12 deficient and needs 1000 mcg po daily. Thanks, Astrid Divine

## 2019-01-07 LAB — H. PYLORI ANTIGEN, STOOL: H. Pylori Stool Ag, Eia: NEGATIVE

## 2019-01-14 ENCOUNTER — Inpatient Hospital Stay: Payer: Medicare Other | Attending: Oncology | Admitting: Oncology

## 2019-01-14 ENCOUNTER — Encounter: Payer: Self-pay | Admitting: Oncology

## 2019-01-14 ENCOUNTER — Other Ambulatory Visit: Payer: Self-pay

## 2019-01-14 DIAGNOSIS — D693 Immune thrombocytopenic purpura: Secondary | ICD-10-CM

## 2019-01-14 NOTE — Progress Notes (Signed)
Pt to get results of labs

## 2019-01-15 NOTE — Progress Notes (Signed)
I connected with Leslie Smith on 01/15/19 at 10:15 AM EDT by video enabled telemedicine visit and verified that I am speaking with the correct person using two identifiers.   I discussed the limitations, risks, security and privacy concerns of performing an evaluation and management service by telemedicine and the availability of in-person appointments. I also discussed with the patient that there may be a patient responsible charge related to this service. The patient expressed understanding and agreed to proceed.  Patient had problems during connection and we had to switch to a telephone call during the visit  Other persons participating in the visit and their role in the encounter:  none  Patient's location:  home Provider's location:  work  Risk analyst Complaint: Discuss results of blood work Diagnosis thrombocytopenia likely secondary to ITP  History of present illness: Patient is a 68 year old female with a past medical history significant for hypertension, multiple sclerosis who was referred to [place] for thrombocytopenia.  Of note patient has moderate thrombocytopenia at least dating back for the last 5 years.  Her CBC in November 2015 showed white count of 6.7, H&H of 12.8/40.1 and a platelet count of 49.  Since then her platelet count has gone up and down and usually remains between 40s to 60s.  On one occasion in 2017 it did drop down to 19.  Most recent platelet count from 01/04/2019 showed white count of 9.4, H&H of 11.5/35.5 and a platelet count of 62.  Patient has undergone hysterectomy in 2011 as well as ankle surgery in 2019 despite her low platelet count and did not have any significant bleeding issues.  Currently patient denies any symptoms of bleeding or bruising.  Appetite and weight are stable.  Denies any unintentional weight loss.  Denies any known gum bleeds or nosebleeds.  Denies any easy bruising  Results of blood work from 01/04/2019 were as follows: CBC showed white count of 9.4, H&H  11.5/35.5 and a platelet count of 62.  B12 level is low at 219.  Folate was normal.  HIV testing was negative.  Smear review was unremarkable and H. pylori stool antigen was negative.   Interval history: Overall patient is doing well.  She denies any bleeding or bruising   Review of Systems  Constitutional: Negative for chills, fever, malaise/fatigue and weight loss.  HENT: Negative for congestion, ear discharge and nosebleeds.   Eyes: Negative for blurred vision.  Respiratory: Negative for cough, hemoptysis, sputum production, shortness of breath and wheezing.   Cardiovascular: Negative for chest pain, palpitations, orthopnea and claudication.  Gastrointestinal: Negative for abdominal pain, blood in stool, constipation, diarrhea, heartburn, melena, nausea and vomiting.  Genitourinary: Negative for dysuria, flank pain, frequency, hematuria and urgency.  Musculoskeletal: Negative for back pain, joint pain and myalgias.  Skin: Negative for rash.  Neurological: Negative for dizziness, tingling, focal weakness, seizures, weakness and headaches.  Endo/Heme/Allergies: Does not bruise/bleed easily.  Psychiatric/Behavioral: Negative for depression and suicidal ideas. The patient does not have insomnia.     Allergies  Allergen Reactions  . Watermelon [Citrullus Vulgaris] Diarrhea    Severe diarrhea within 2 minutes of eating  . Adhesive [Tape]   . Other Nausea And Vomiting    general anesthesia - deathly ill   . Atorvastatin Other (See Comments)    Joint pain  . Meloxicam Nausea Only    Past Medical History:  Diagnosis Date  . 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[place]) 09/15/2014  . Bladder incontinence   . Cancer ([place]) 2014   brain  tumor, resected  . Cellulitis and abscess of leg 2019   2 episodes this year  . Complication of anesthesia   . DJD (degenerative joint disease) of knee 2018  . Fall   . Fatigue   . History of blood transfusion   . Hypertension   . MS (multiple sclerosis) ([place])   .  Myelitis ([place])   . Neuromuscular disorder ([place]) 2001   multiple sclerosis diagnosed by MRI  . PONV (postoperative nausea and vomiting)    severe  . Thrombocytopenia ([place])     Past Surgical History:  Procedure Laterality Date  . ABDOMINAL HYSTERECTOMY  2011   required wound vac for 6 weeks. ovaries had grown attached to her back bone  . BRAIN SURGERY  2015   tumor resection-denies seizures  . BREAST SURGERY Left 2014   biopsy. negative  . HARDWARE REMOVAL Left 05/05/2018   Procedure: HARDWARE REMOVAL- LEFT ANKLE;  Surgeon: Hessie Knows, MD;  Location: [facility];  Service: Orthopedics;  Laterality: Left;  . I&D EXTREMITY Left 08/25/2015   Procedure: IRRIGATION AND DEBRIDEMENT THUMB;  Surgeon: Iran Planas, MD;  Location: [place];  Service: Orthopedics;  Laterality: Left;  . ORIF ANKLE FRACTURE Left 02/20/2018   Procedure: OPEN REDUCTION INTERNAL FIXATION (ORIF) ANKLE FRACTURE;  Surgeon: Hessie Knows, MD;  Location: [facility];  Service: Orthopedics;  Laterality: Left;    Social History   Socioeconomic History  . Marital status: Single    Spouse name: Not on file  . Number of children: Not on file  . Years of education: high school, some college   . Highest education level: High school graduate  Occupational History  . Occupation: Training and development officer    Comment: disabled d/t MS  Social Needs  . Financial resource strain: Not very hard  . Food insecurity    Worry: Never true    Inability: Never true  . Transportation needs    Medical: No    Non-medical: No  Tobacco Use  . Smoking status: Never Smoker  . Smokeless tobacco: Never Used  Substance and Sexual Activity  . Alcohol use: Yes    Comment: occassional  . Drug use: Yes    Types: Marijuana    Comment: uses for MS  . Sexual activity: Not on file  Lifestyle  . Physical activity    Days per week: 0 days    Minutes per session: 0 min  . Stress: Not at all  Relationships  . Social connections    Talks on phone: More than three  times a week    Gets together: More than three times a week    Attends religious service: More than 4 times per year    Active member of club or organization: No    Attends meetings of clubs or organizations: Never    Relationship status: Never married  . Intimate partner violence    Fear of current or ex partner: No    Emotionally abused: No    Physically abused: No    Forced sexual activity: No  Other Topics Concern  . Not on file  Social History Narrative   Tai chi at [employer]     Family History  Problem Relation Age of Onset  . Heart disease Father   . Cancer Father      Current Outpatient Medications:  .  Biotin 10 MG CAPS, Take 1 Dose by mouth daily. , Disp: , Rfl:  .  Cholecalciferol (VITAMIN D-3) 25 MCG (1000 UT) CAPS, Take 1,000  Units by mouth daily., Disp: , Rfl:  .  hydrochlorothiazide (HYDRODIURIL) 25 MG tablet, Take 1 tablet (25 mg total) by mouth daily., Disp: 90 tablet, Rfl: 4 .  ibuprofen (IBU) 800 MG tablet, Take 1 tablet (800 mg total) by mouth every 8 (eight) hours as needed for moderate pain., Disp: 30 tablet, Rfl: 6 .  losartan (COZAAR) 100 MG tablet, Take 1 tablet (100 mg total) by mouth daily., Disp: 90 tablet, Rfl: 4 .  Melatonin 3 MG TABS, Take 3-6 mg by mouth at bedtime., Disp: , Rfl:  .  niacin 100 MG tablet, Take 100 mg by mouth at bedtime., Disp: , Rfl:  .  vitamin B-12 (CYANOCOBALAMIN) 1000 MCG tablet, Take 1,000 mcg by mouth daily., Disp: , Rfl:   No results found.  No images are attached to the encounter.   CMP Latest Ref Rng & Units 01/04/2019  Glucose 70 - 99 mg/dL 97  BUN 8 - 23 mg/dL 15  Creatinine 0.44 - 1.00 mg/dL 0.83  Sodium 135 - 145 mmol/L 140  Potassium 3.5 - 5.1 mmol/L 3.8  Chloride 98 - 111 mmol/L 103  CO2 22 - 32 mmol/L 28  Calcium 8.9 - 10.3 mg/dL 9.0  Total Protein 6.5 - 8.1 g/dL 7.3  Total Bilirubin 0.3 - 1.2 mg/dL 0.6  Alkaline Phos 38 - 126 U/L 70  AST 15 - 41 U/L 15  ALT 0 - 44 U/L 14   CBC Latest Ref Rng & Units  01/04/2019  WBC 4.0 - 10.5 K/uL 9.4  Hemoglobin 12.0 - 15.0 g/dL 11.5(L)  Hematocrit 36.0 - 46.0 % 35.5(L)  Platelets 150 - 400 K/uL 62(L)     Assessment and plan: Patient is a 68 year old female referred for thrombocytopenia and this is a visit to discuss the results of her blood work  Patient has a normal white count.  Her hemoglobin is remained stable between 11-12 over the last 3 years.  She does have chronic thrombocytopenia and her platelet counts fluctuate between 40-60.  I suspect this is secondary to ITP.  Discussed what ITP is and the indication to treat this condition would be if her platelet count dropped to less than 30.  Her B12 levels were low normal at 290 and have asked her to take oral B12 thousand micrograms p.o. daily.  Follow-up instructions: Repeat CBC with differential in 3 months in 6 months and I will see her back in 6 months and also check a B12 level at that time  I discussed the assessment and treatment plan with the patient. The patient was provided an opportunity to ask questions and all were answered. The patient agreed with the plan and demonstrated an understanding of the instructions.   The patient was advised to call back or seek an in-person evaluation if the symptoms worsen or if the condition fails to improve as anticipated.    Visit Diagnosis: 1. Chronic ITP (idiopathic thrombocytopenia) (HCC)     Dr. Randa Evens, MD, MPH [facility] at [facility] Pager- [phone] 01/15/2019 11:23 AM

## 2019-02-02 ENCOUNTER — Ambulatory Visit: Payer: Medicare Other

## 2019-02-12 DIAGNOSIS — Z1231 Encounter for screening mammogram for malignant neoplasm of breast: Secondary | ICD-10-CM | POA: Diagnosis not present

## 2019-02-12 LAB — HM MAMMOGRAPHY

## 2019-02-16 ENCOUNTER — Ambulatory Visit
Admission: RE | Admit: 2019-02-16 | Discharge: 2019-02-16 | Disposition: A | Discharge status: Home / Self Care | Payer: Medicare Other | Source: Ambulatory Visit | Attending: Family Medicine | Admitting: Family Medicine

## 2019-02-16 DIAGNOSIS — Z780 Asymptomatic menopausal state: Secondary | ICD-10-CM

## 2019-02-16 DIAGNOSIS — Z13820 Encounter for screening for osteoporosis: Secondary | ICD-10-CM | POA: Diagnosis not present

## 2019-03-12 IMAGING — DX DG ANKLE 2V *L*
2 series · 2 of 2 positions shown · non-contrast
Comparison: Radiographs February 20, 2018.

CLINICAL DATA: Status post hardware removal from left ankle.

EXAM:
LEFT ANKLE - 2 VIEW

[ankle ap]
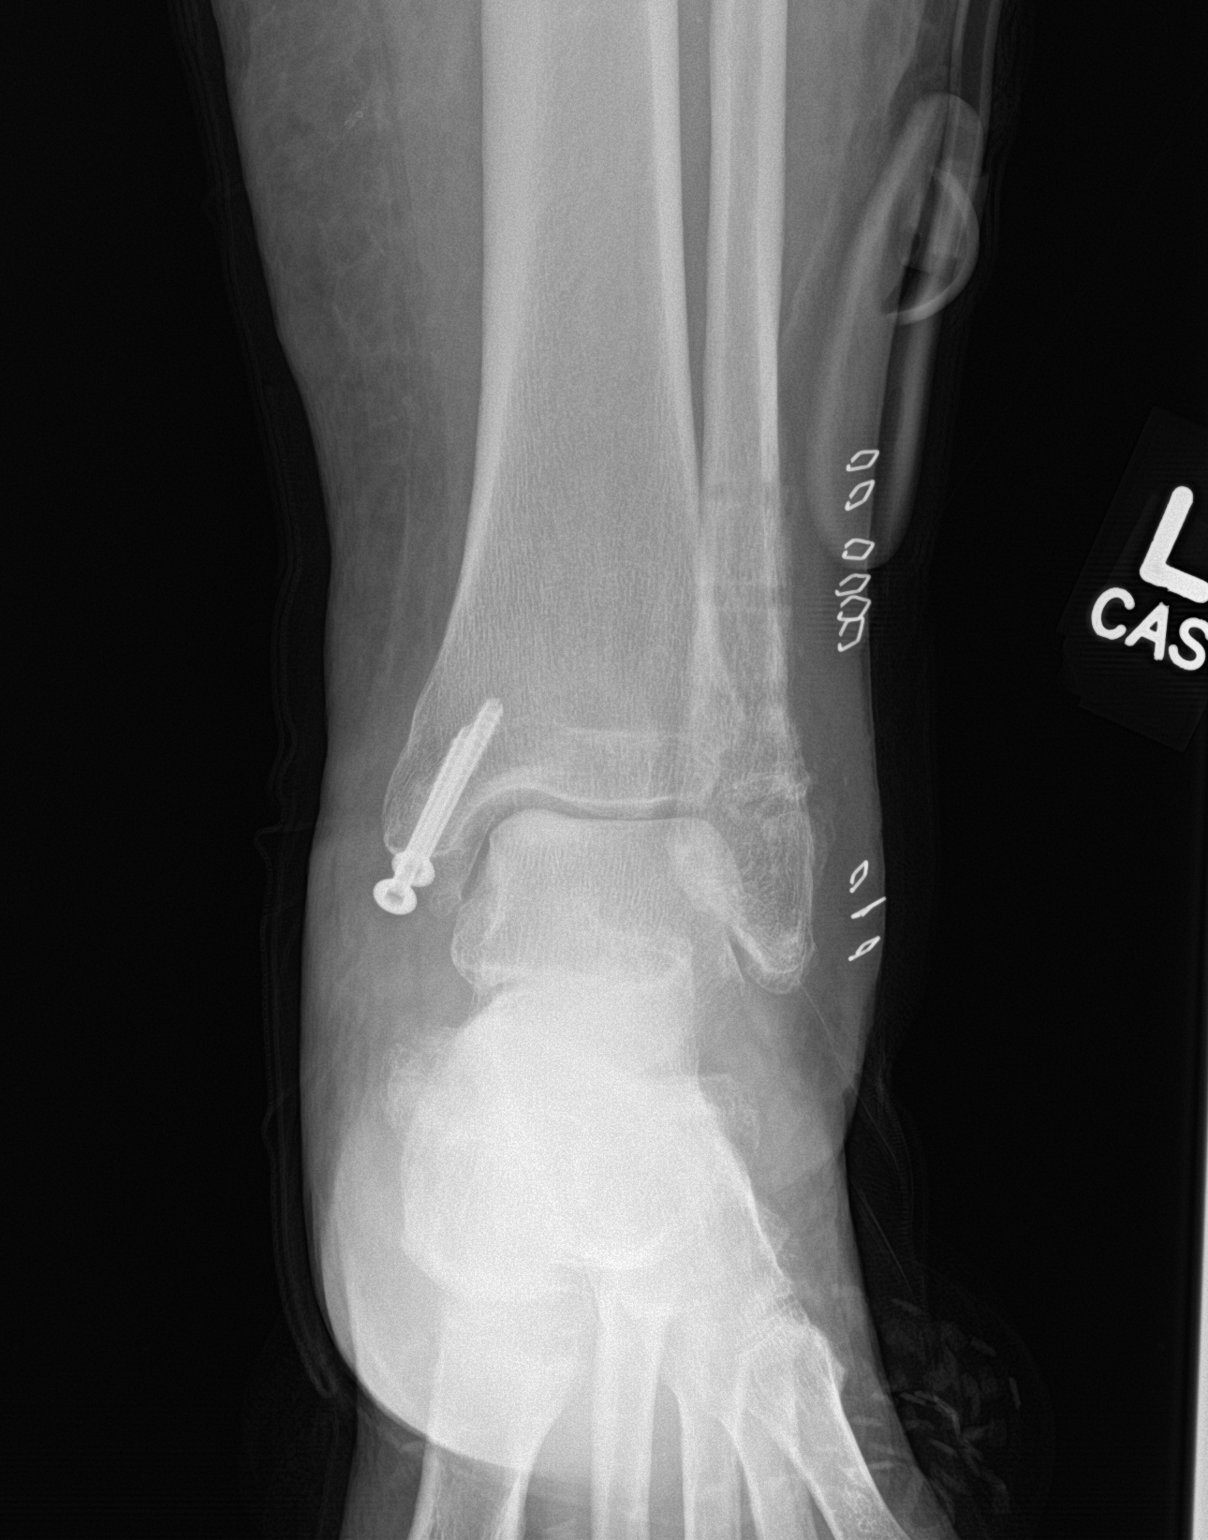

[ankle lat]
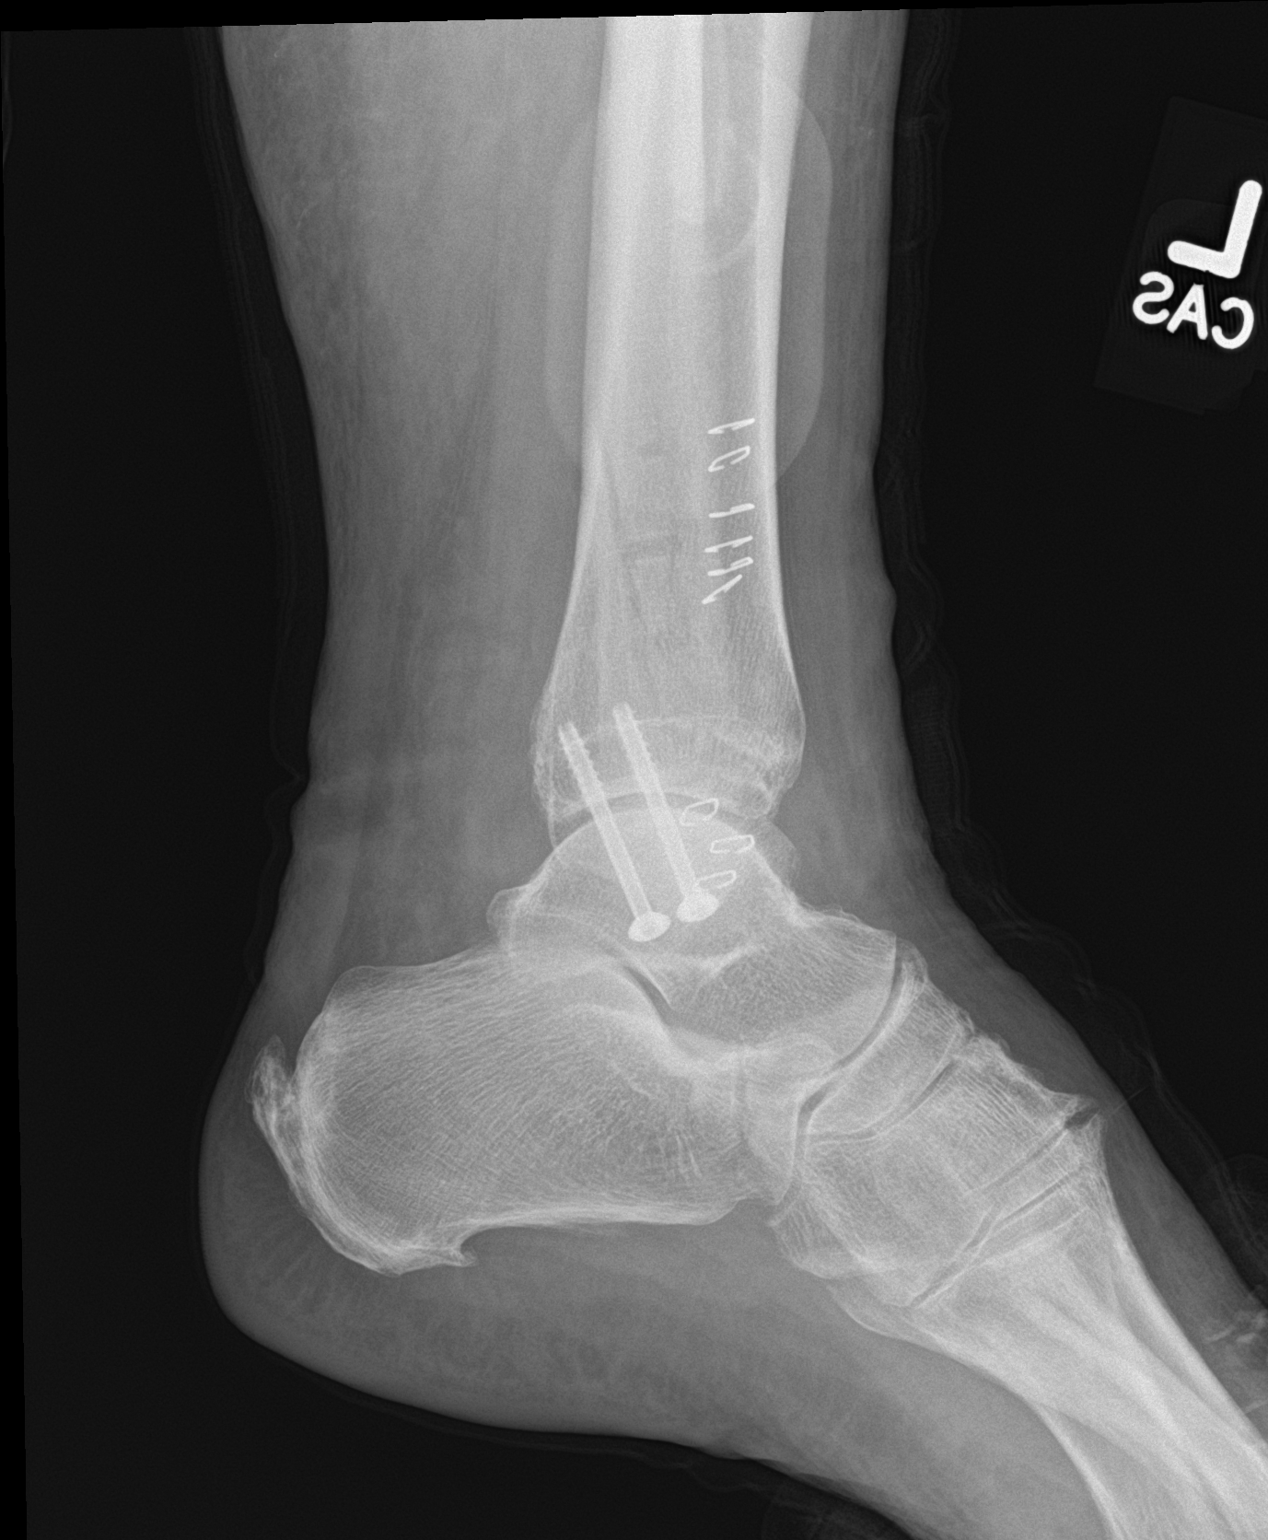

[2 of 2 positions shown; findings below may reference images not displayed]

FINDINGS: There is been interval removal of surgical plate and screws from the
distal left fibula. Status post surgical internal fixation of old
medial malleolar fracture. No new fracture or dislocation is noted.
IMPRESSION: Status post surgical removal of surgical hardware from distal left
fibula.

## 2019-04-15 ENCOUNTER — Other Ambulatory Visit: Payer: Self-pay

## 2019-04-16 ENCOUNTER — Other Ambulatory Visit: Payer: Self-pay

## 2019-04-16 ENCOUNTER — Inpatient Hospital Stay: Payer: Medicare Other | Attending: Oncology

## 2019-04-16 DIAGNOSIS — D693 Immune thrombocytopenic purpura: Secondary | ICD-10-CM | POA: Diagnosis not present

## 2019-04-16 LAB — CBC WITH DIFFERENTIAL/PLATELET
Abs Immature Granulocytes: 0.05 10*3/uL (ref 0.00–0.07)
Basophils Absolute: 0.1 10*3/uL (ref 0.0–0.1)
Basophils Relative: 1 %
Eosinophils Absolute: 0.3 10*3/uL (ref 0.0–0.5)
Eosinophils Relative: 3 %
HCT: 35.9 % — ABNORMAL LOW (ref 36.0–46.0)
Hemoglobin: 11.7 g/dL — ABNORMAL LOW (ref 12.0–15.0)
Immature Granulocytes: 1 %
Lymphocytes Relative: 21 %
Lymphs Abs: 1.9 10*3/uL (ref 0.7–4.0)
MCH: 29 pg (ref 26.0–34.0)
MCHC: 32.6 g/dL (ref 30.0–36.0)
MCV: 89.1 fL (ref 80.0–100.0)
Monocytes Absolute: 0.5 10*3/uL (ref 0.1–1.0)
Monocytes Relative: 6 %
Neutro Abs: 6.5 10*3/uL (ref 1.7–7.7)
Neutrophils Relative %: 68 %
Platelets: 62 10*3/uL — ABNORMAL LOW (ref 150–400)
RBC: 4.03 MIL/uL (ref 3.87–5.11)
RDW: 14.3 % (ref 11.5–15.5)
WBC: 9.4 10*3/uL (ref 4.0–10.5)
nRBC: 0 % (ref 0.0–0.2)

## 2019-07-05 ENCOUNTER — Ambulatory Visit: Payer: Self-pay | Admitting: Family Medicine

## 2019-07-16 ENCOUNTER — Other Ambulatory Visit: Payer: Self-pay

## 2019-07-16 DIAGNOSIS — E538 Deficiency of other specified B group vitamins: Secondary | ICD-10-CM

## 2019-07-16 DIAGNOSIS — D693 Immune thrombocytopenic purpura: Secondary | ICD-10-CM

## 2019-07-19 ENCOUNTER — Inpatient Hospital Stay: Payer: Medicare Other

## 2019-07-19 ENCOUNTER — Inpatient Hospital Stay: Payer: Medicare Other | Attending: Oncology | Admitting: Oncology

## 2019-07-19 ENCOUNTER — Encounter: Payer: Self-pay | Admitting: Oncology

## 2019-07-19 ENCOUNTER — Other Ambulatory Visit: Payer: Self-pay

## 2019-07-19 DIAGNOSIS — D693 Immune thrombocytopenic purpura: Secondary | ICD-10-CM | POA: Insufficient documentation

## 2019-07-19 DIAGNOSIS — E538 Deficiency of other specified B group vitamins: Secondary | ICD-10-CM

## 2019-07-19 LAB — CBC WITH DIFFERENTIAL/PLATELET
Abs Immature Granulocytes: 0.06 10*3/uL (ref 0.00–0.07)
Basophils Absolute: 0.1 10*3/uL (ref 0.0–0.1)
Basophils Relative: 1 %
Eosinophils Absolute: 0.3 10*3/uL (ref 0.0–0.5)
Eosinophils Relative: 3 %
HCT: 37.4 % (ref 36.0–46.0)
Hemoglobin: 11.8 g/dL — ABNORMAL LOW (ref 12.0–15.0)
Immature Granulocytes: 1 %
Lymphocytes Relative: 21 %
Lymphs Abs: 1.9 10*3/uL (ref 0.7–4.0)
MCH: 28.4 pg (ref 26.0–34.0)
MCHC: 31.6 g/dL (ref 30.0–36.0)
MCV: 90.1 fL (ref 80.0–100.0)
Monocytes Absolute: 0.4 10*3/uL (ref 0.1–1.0)
Monocytes Relative: 5 %
Neutro Abs: 6.4 10*3/uL (ref 1.7–7.7)
Neutrophils Relative %: 69 %
Platelets: 55 10*3/uL — ABNORMAL LOW (ref 150–400)
RBC: 4.15 MIL/uL (ref 3.87–5.11)
RDW: 14 % (ref 11.5–15.5)
WBC: 9.1 10*3/uL (ref 4.0–10.5)
nRBC: 0 % (ref 0.0–0.2)

## 2019-07-19 LAB — VITAMIN B12: Vitamin B-12: 1690 pg/mL — ABNORMAL HIGH (ref 180–914)

## 2019-07-19 NOTE — Progress Notes (Signed)
Patient stated that her hair had been thinning lately.

## 2019-07-22 NOTE — Progress Notes (Signed)
I connected with Leslie Smith on 07/22/19 at 10:15 AM EST by video enabled telemedicine visit and verified that I am speaking with the correct person using two identifiers.   I discussed the limitations, risks, security and privacy concerns of performing an evaluation and management service by telemedicine and the availability of in-person appointments. I also discussed with the patient that there may be a patient responsible charge related to this service. The patient expressed understanding and agreed to proceed.  Other persons participating in the visit and their role in the encounter:  none  Patient's location:  home Provider's location:  work  Risk analyst Complaint: Routine follow-up of ITP  History of present illness: Patient is a 69 year old female with a past medical history significant for hypertension, multiple sclerosis who was referred to [place] for thrombocytopenia. Of note patient has moderate thrombocytopenia at least dating back for the last 5 years. Her CBC in November 2015 showed white count of 6.7, H&H of 12.8/40.1 and a platelet count of 49. Since then her platelet count has gone up and down and usually remains between 40s to 60s. On one occasion in 2017 it did drop down to 19. Most recent platelet count from 01/04/2019 showed white count of 9.4, H&H of 11.5/35.5 and a platelet count of 62. Patient has undergone hysterectomy in 2011 as well as ankle surgery in 2019 despite her low platelet count and did not have any significant bleeding issues. Currently patient denies any symptoms of bleeding or bruising. Appetite and weight are stable. Denies any unintentional weight loss. Denies any known gum bleeds or nosebleeds. Denies any easy bruising  Results of blood work from 01/04/2019 were as follows: CBC showed white count of 9.4, H&H 11.5/35.5 and a platelet count of 62.  B12 level is low at 219.  Folate was normal.  HIV testing was negative.  Smear review was unremarkable and H. pylori  stool antigen was negative.   Interval history patient is doing well and denies any complaints of bleeding or bruising.  Denies any petechiae.  Denies any fatigue   Review of Systems  Constitutional: Negative for chills, fever, malaise/fatigue and weight loss.  HENT: Negative for congestion, ear discharge and nosebleeds.   Eyes: Negative for blurred vision.  Respiratory: Negative for cough, hemoptysis, sputum production, shortness of breath and wheezing.   Cardiovascular: Negative for chest pain, palpitations, orthopnea and claudication.  Gastrointestinal: Negative for abdominal pain, blood in stool, constipation, diarrhea, heartburn, melena, nausea and vomiting.  Genitourinary: Negative for dysuria, flank pain, frequency, hematuria and urgency.  Musculoskeletal: Negative for back pain, joint pain and myalgias.  Skin: Negative for rash.  Neurological: Negative for dizziness, tingling, focal weakness, seizures, weakness and headaches.  Endo/Heme/Allergies: Does not bruise/bleed easily.  Psychiatric/Behavioral: Negative for depression and suicidal ideas. The patient does not have insomnia.     Allergies  Allergen Reactions  . Watermelon [Citrullus Vulgaris] Diarrhea    Severe diarrhea within 2 minutes of eating  . Adhesive [Tape]   . Other Nausea And Vomiting    general anesthesia - deathly ill   . Atorvastatin Other (See Comments)    Joint pain  . Meloxicam Nausea Only    Past Medical History:  Diagnosis Date  . 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 Bladder incontinence   . Cancer [facility]) 2014   brain tumor, resected  . Cellulitis and abscess of leg 2019   2 episodes this year  . Complication of anesthesia   . DJD (degenerative joint disease)  of knee 2018  . Fall   . Fatigue   . History of blood transfusion   . Hypertension   . MS (multiple sclerosis) ([place])   . Myelitis ([place])   . Neuromuscular disorder ([place]) 2001   multiple sclerosis diagnosed by MRI  . PONV  (postoperative nausea and vomiting)    severe  . Thrombocytopenia ([place])     Past Surgical History:  Procedure Laterality Date  . ABDOMINAL HYSTERECTOMY  2011   required wound vac for 6 weeks. ovaries had grown attached to her back bone  . BRAIN SURGERY  2015   tumor resection-denies seizures  . BREAST SURGERY Left 2014   biopsy. negative  . HARDWARE REMOVAL Left 05/05/2018   Procedure: HARDWARE REMOVAL- LEFT ANKLE;  Surgeon: Hessie Knows, MD;  Location: [facility];  Service: Orthopedics;  Laterality: Left;  . I & D EXTREMITY Left 08/25/2015   Procedure: IRRIGATION AND DEBRIDEMENT THUMB;  Surgeon: Iran Planas, MD;  Location: [place];  Service: Orthopedics;  Laterality: Left;  . ORIF ANKLE FRACTURE Left 02/20/2018   Procedure: OPEN REDUCTION INTERNAL FIXATION (ORIF) ANKLE FRACTURE;  Surgeon: Hessie Knows, MD;  Location: [facility];  Service: Orthopedics;  Laterality: Left;    Social History   Socioeconomic History  . Marital status: Single    Spouse name: Not on file  . Number of children: Not on file  . Years of education: high school, some college   . Highest education level: High school graduate  Occupational History  . 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 Smoking status: Never Smoker  . Smokeless tobacco: Never Used  Substance and Sexual Activity  . Alcohol use: Yes    Comment: occassional  . Drug use: Yes    Types: Marijuana    Comment: uses for MS  . Sexual activity: Not on file  Other Topics Concern  . Not on file  Social History Narrative   Tai chi at [place] Strain:   . Difficulty of Paying Living Expenses: Not on file  Food Insecurity:   . Worried About Charity fundraiser in the Last Year: Not on file  . Ran Out of Food in the Last Year: Not on file  Transportation Needs:   . Lack of Transportation (Medical): Not on file  . Lack of Transportation (Non-Medical): Not on file   Physical Activity:   . Days of Exercise per Week: Not on file  . Minutes of Exercise per Session: Not on file  Stress:   . Feeling of Stress : Not on file  Social Connections:   . Frequency of Communication with Friends and Family: Not on file  . Frequency of Social Gatherings with Friends and Family: Not on file  . Attends Religious Services: Not on file  . Active Member of Clubs or Organizations: Not on file  . Attends Archivist Meetings: Not on file  . Marital Status: Not on file  Intimate Partner Violence:   . Fear of Current or Ex-Partner: Not on file  . Emotionally Abused: Not on file  . Physically Abused: Not on file  . Sexually Abused: Not on file    Family History  Problem Relation Age of Onset  . Heart disease Father   . Cancer Father      Current Outpatient Medications:  .  Cholecalciferol (VITAMIN D-3) 25 MCG (1000 UT) CAPS, Take 1,000 Units  by mouth daily., Disp: , Rfl:  .  hydrochlorothiazide (HYDRODIURIL) 25 MG tablet, Take 1 tablet (25 mg total) by mouth daily., Disp: 90 tablet, Rfl: 4 .  ibuprofen (IBU) 800 MG tablet, Take 1 tablet (800 mg total) by mouth every 8 (eight) hours as needed for moderate pain., Disp: 30 tablet, Rfl: 6 .  losartan (COZAAR) 100 MG tablet, Take 1 tablet (100 mg total) by mouth daily., Disp: 90 tablet, Rfl: 4 .  Melatonin 3 MG TABS, Take 3-6 mg by mouth at bedtime., Disp: , Rfl:  .  niacin 100 MG tablet, Take 100 mg by mouth at bedtime., Disp: , Rfl:  .  vitamin B-12 (CYANOCOBALAMIN) 1000 MCG tablet, Take 1,000 mcg by mouth daily., Disp: , Rfl:   No results found.  No images are attached to the encounter.   CMP Latest Ref Rng & Units 01/04/2019  Glucose 70 - 99 mg/dL 97  BUN 8 - 23 mg/dL 15  Creatinine 0.44 - 1.00 mg/dL 0.83  Sodium 135 - 145 mmol/L 140  Potassium 3.5 - 5.1 mmol/L 3.8  Chloride 98 - 111 mmol/L 103  CO2 22 - 32 mmol/L 28  Calcium 8.9 - 10.3 mg/dL 9.0  Total Protein 6.5 - 8.1 g/dL 7.3  Total  Bilirubin 0.3 - 1.2 mg/dL 0.6  Alkaline Phos 38 - 126 U/L 70  AST 15 - 41 U/L 15  ALT 0 - 44 U/L 14   CBC Latest Ref Rng & Units 07/19/2019  WBC 4.0 - 10.5 K/uL 9.1  Hemoglobin 12.0 - 15.0 g/dL 11.8(L)  Hematocrit 36.0 - 46.0 % 37.4  Platelets 150 - 400 K/uL 55(L)    Assessment and plan: Patient is a 69 year old female with chronic ITP and this is a routine follow-up visit  Patient has had chronic ITP over the last 6 years and a platelet counts fluctuate between 40-60.  Presently her platelet count is 55.  Hemoglobin stable around 11-12.  White cell count is normal.  Suspect this is chronic ITP which can be conservatively monitored and she does not need any treatment unless the platelet counts are less than 30.  We also discussed her getting Covid vaccine.  As per ASH guidelines the benefits of Covid vaccine outweigh the possible risks of thrombocytopenia associated with the vaccine.  Moreover, Covid infection itself is associated with ITP.  If patient decides to get her Covid vaccine she should inform us that I would be happy to check her platelet counts more frequently for 3 to 4 weeks following the vaccination.  Patient also knows to get in touch with [place] if she has any new in onset bleeding bruising or petechiae.  Follow-up instructions: CBC with differential in 6 months followed by a video visit  I discussed the assessment and treatment plan with the patient. The patient was provided an opportunity to ask questions and all were answered. The patient agreed with the plan and demonstrated an understanding of the instructions.   The patient was advised to call back or seek an in-person evaluation if the symptoms worsen or if the condition fails to improve as anticipated.    Visit Diagnosis: 1. Chronic ITP (idiopathic thrombocytopenia) (HCC)     Dr. Randa Evens, MD, MPH [facility] at [facility] Tel- [phone] 07/22/2019 8:29 AM

## 2019-08-14 DIAGNOSIS — Z23 Encounter for immunization: Secondary | ICD-10-CM | POA: Diagnosis not present

## 2019-09-01 DIAGNOSIS — G35 Multiple sclerosis: Secondary | ICD-10-CM | POA: Diagnosis not present

## 2019-09-15 DIAGNOSIS — R2689 Other abnormalities of gait and mobility: Secondary | ICD-10-CM | POA: Diagnosis not present

## 2019-09-15 DIAGNOSIS — M25569 Pain in unspecified knee: Secondary | ICD-10-CM | POA: Diagnosis not present

## 2019-09-15 DIAGNOSIS — Z23 Encounter for immunization: Secondary | ICD-10-CM | POA: Diagnosis not present

## 2019-09-21 DIAGNOSIS — R2689 Other abnormalities of gait and mobility: Secondary | ICD-10-CM | POA: Diagnosis not present

## 2019-09-21 DIAGNOSIS — M25569 Pain in unspecified knee: Secondary | ICD-10-CM | POA: Diagnosis not present

## 2019-09-23 DIAGNOSIS — M25569 Pain in unspecified knee: Secondary | ICD-10-CM | POA: Diagnosis not present

## 2019-09-23 DIAGNOSIS — R2689 Other abnormalities of gait and mobility: Secondary | ICD-10-CM | POA: Diagnosis not present

## 2019-09-28 DIAGNOSIS — M25569 Pain in unspecified knee: Secondary | ICD-10-CM | POA: Diagnosis not present

## 2019-09-28 DIAGNOSIS — R2689 Other abnormalities of gait and mobility: Secondary | ICD-10-CM | POA: Diagnosis not present

## 2019-09-30 DIAGNOSIS — M25569 Pain in unspecified knee: Secondary | ICD-10-CM | POA: Diagnosis not present

## 2019-09-30 DIAGNOSIS — R2689 Other abnormalities of gait and mobility: Secondary | ICD-10-CM | POA: Diagnosis not present

## 2019-10-04 ENCOUNTER — Telehealth: Payer: Self-pay | Admitting: Oncology

## 2019-10-04 NOTE — Telephone Encounter (Signed)
MD will not be in the office for appt. Writer spoke with patient on this date and rescheduled appts.

## 2019-10-05 DIAGNOSIS — M25569 Pain in unspecified knee: Secondary | ICD-10-CM | POA: Diagnosis not present

## 2019-10-05 DIAGNOSIS — R2689 Other abnormalities of gait and mobility: Secondary | ICD-10-CM | POA: Diagnosis not present

## 2019-10-11 DIAGNOSIS — M25569 Pain in unspecified knee: Secondary | ICD-10-CM | POA: Diagnosis not present

## 2019-10-11 DIAGNOSIS — R2689 Other abnormalities of gait and mobility: Secondary | ICD-10-CM | POA: Diagnosis not present

## 2019-10-13 DIAGNOSIS — M25569 Pain in unspecified knee: Secondary | ICD-10-CM | POA: Diagnosis not present

## 2019-10-13 DIAGNOSIS — R2689 Other abnormalities of gait and mobility: Secondary | ICD-10-CM | POA: Diagnosis not present

## 2019-10-19 DIAGNOSIS — M25569 Pain in unspecified knee: Secondary | ICD-10-CM | POA: Diagnosis not present

## 2019-10-19 DIAGNOSIS — R2689 Other abnormalities of gait and mobility: Secondary | ICD-10-CM | POA: Diagnosis not present

## 2019-10-22 DIAGNOSIS — R2689 Other abnormalities of gait and mobility: Secondary | ICD-10-CM | POA: Diagnosis not present

## 2019-10-22 DIAGNOSIS — M25569 Pain in unspecified knee: Secondary | ICD-10-CM | POA: Diagnosis not present

## 2019-10-26 DIAGNOSIS — M25569 Pain in unspecified knee: Secondary | ICD-10-CM | POA: Diagnosis not present

## 2019-10-26 DIAGNOSIS — R2689 Other abnormalities of gait and mobility: Secondary | ICD-10-CM | POA: Diagnosis not present

## 2019-11-02 DIAGNOSIS — M25569 Pain in unspecified knee: Secondary | ICD-10-CM | POA: Diagnosis not present

## 2019-11-02 DIAGNOSIS — R2689 Other abnormalities of gait and mobility: Secondary | ICD-10-CM | POA: Diagnosis not present

## 2019-11-05 DIAGNOSIS — M25569 Pain in unspecified knee: Secondary | ICD-10-CM | POA: Diagnosis not present

## 2019-11-05 DIAGNOSIS — R2689 Other abnormalities of gait and mobility: Secondary | ICD-10-CM | POA: Diagnosis not present

## 2020-01-14 DIAGNOSIS — I10 Essential (primary) hypertension: Secondary | ICD-10-CM | POA: Diagnosis not present

## 2020-01-14 DIAGNOSIS — Z789 Other specified health status: Secondary | ICD-10-CM | POA: Insufficient documentation

## 2020-01-14 DIAGNOSIS — G479 Sleep disorder, unspecified: Secondary | ICD-10-CM | POA: Diagnosis not present

## 2020-01-14 DIAGNOSIS — R0602 Shortness of breath: Secondary | ICD-10-CM | POA: Diagnosis not present

## 2020-01-14 DIAGNOSIS — Z1231 Encounter for screening mammogram for malignant neoplasm of breast: Secondary | ICD-10-CM | POA: Diagnosis not present

## 2020-01-14 DIAGNOSIS — E782 Mixed hyperlipidemia: Secondary | ICD-10-CM | POA: Diagnosis not present

## 2020-01-14 DIAGNOSIS — Z6841 Body Mass Index (BMI) 40.0 and over, adult: Secondary | ICD-10-CM | POA: Diagnosis not present

## 2020-01-14 DIAGNOSIS — D693 Immune thrombocytopenic purpura: Secondary | ICD-10-CM | POA: Diagnosis not present

## 2020-01-14 DIAGNOSIS — G35 Multiple sclerosis: Secondary | ICD-10-CM | POA: Diagnosis not present

## 2020-01-17 ENCOUNTER — Other Ambulatory Visit: Payer: Medicare Other

## 2020-01-18 ENCOUNTER — Telehealth: Payer: Medicare Other | Admitting: Oncology

## 2020-01-21 ENCOUNTER — Inpatient Hospital Stay: Payer: Medicare Other | Attending: Oncology

## 2020-01-21 ENCOUNTER — Inpatient Hospital Stay: Payer: Medicare Other

## 2020-01-21 ENCOUNTER — Other Ambulatory Visit: Payer: Self-pay

## 2020-01-21 DIAGNOSIS — D693 Immune thrombocytopenic purpura: Secondary | ICD-10-CM

## 2020-01-21 DIAGNOSIS — R0602 Shortness of breath: Secondary | ICD-10-CM | POA: Diagnosis not present

## 2020-01-21 DIAGNOSIS — I10 Essential (primary) hypertension: Secondary | ICD-10-CM | POA: Diagnosis not present

## 2020-01-21 LAB — CBC WITH DIFFERENTIAL/PLATELET
Abs Immature Granulocytes: 0.03 10*3/uL (ref 0.00–0.07)
Basophils Absolute: 0.1 10*3/uL (ref 0.0–0.1)
Basophils Relative: 1 %
Eosinophils Absolute: 0.2 10*3/uL (ref 0.0–0.5)
Eosinophils Relative: 3 %
HCT: 36.5 % (ref 36.0–46.0)
Hemoglobin: 11.9 g/dL — ABNORMAL LOW (ref 12.0–15.0)
Immature Granulocytes: 0 %
Lymphocytes Relative: 27 %
Lymphs Abs: 2 10*3/uL (ref 0.7–4.0)
MCH: 28.5 pg (ref 26.0–34.0)
MCHC: 32.6 g/dL (ref 30.0–36.0)
MCV: 87.3 fL (ref 80.0–100.0)
Monocytes Absolute: 0.5 10*3/uL (ref 0.1–1.0)
Monocytes Relative: 6 %
Neutro Abs: 4.7 10*3/uL (ref 1.7–7.7)
Neutrophils Relative %: 63 %
Platelets: 62 10*3/uL — ABNORMAL LOW (ref 150–400)
RBC: 4.18 MIL/uL (ref 3.87–5.11)
RDW: 13.8 % (ref 11.5–15.5)
WBC: 7.5 10*3/uL (ref 4.0–10.5)
nRBC: 0 % (ref 0.0–0.2)

## 2020-01-24 ENCOUNTER — Other Ambulatory Visit: Payer: Self-pay

## 2020-01-24 ENCOUNTER — Inpatient Hospital Stay (HOSPITAL_BASED_OUTPATIENT_CLINIC_OR_DEPARTMENT_OTHER): Payer: Medicare Other | Admitting: Oncology

## 2020-01-24 DIAGNOSIS — D693 Immune thrombocytopenic purpura: Secondary | ICD-10-CM | POA: Diagnosis not present

## 2020-01-28 NOTE — Progress Notes (Signed)
I connected with Leslie Smith on 01/28/20 at  2:45 PM EDT by video enabled telemedicine visit and verified that I am speaking with the correct person using two identifiers.   I discussed the limitations, risks, security and privacy concerns of performing an evaluation and management service by telemedicine and the availability of in-person appointments. I also discussed with the patient that there may be a patient responsible charge related to this service. The patient expressed understanding and agreed to proceed.  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 Of note patient has moderate thrombocytopenia at least dating back for the last 5 years. Her CBC in November 2015 showed white count of 6.7, H&H of 12.8/40.1 and a platelet count of 49. Since then her platelet count has gone up and down and usually remains between 40s to 60s. On one occasion in 2017 it did drop down to 19. Most recent platelet count from 01/04/2019 showed white count of 9.4, H&H of 11.5/35.5 and a platelet count of 62. Patient has undergone hysterectomy in 2011 as well as ankle surgery in 2019 despite her low platelet count and did not have any significant bleeding issues. Currently patient denies any symptoms of bleeding or bruising. Appetite and weight are stable. Denies any unintentional weight loss. Denies any known gum bleeds or nosebleeds. Denies any easy bruising  Results of blood work from 01/04/2019 were as follows: CBC showed white count of 9.4, H&H 11.5/35.5 and a platelet count of 62.  B12 level is low at 219. Folate was normal. HIV testing was negative. Smear review was unremarkable and H. pylori stool antigen was negative  Interval history patient is doing well and denies any symptoms of bleeding or bruising presently.   Review of Systems  Constitutional: Negative for chills, fever, malaise/fatigue and weight loss.  HENT: Negative for congestion, ear discharge and nosebleeds.   Eyes: Negative for blurred vision.  Respiratory: Negative for cough, hemoptysis, sputum production, shortness of breath and wheezing.   Cardiovascular: Negative for chest pain, palpitations, orthopnea and claudication.  Gastrointestinal: Negative for abdominal pain, blood in stool, constipation, diarrhea, heartburn, melena, nausea and vomiting.  Genitourinary: Negative for dysuria, flank pain, frequency, hematuria and urgency.  Musculoskeletal: Negative for back pain, joint pain and myalgias.  Skin: Negative for rash.  Neurological: Negative for dizziness, tingling, focal weakness, seizures, weakness and headaches.  Endo/Heme/Allergies: Does not bruise/bleed easily.  Psychiatric/Behavioral: Negative for depression and suicidal ideas. The patient does not have insomnia.     Allergies  Allergen Reactions  . Watermelon [Citrullus Vulgaris] Diarrhea    Severe diarrhea within 2 minutes of eating  . Adhesive [Tape]   . Other Nausea And Vomiting    general anesthesia - deathly ill   . Atorvastatin Other (See Comments)    Joint pain  . Meloxicam Nausea Only    Past Medical History:  Diagnosis Date  . 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 Bladder incontinence   . Cancer [facility]) 2014   brain tumor, resected  . Cellulitis and abscess of leg 2019   2 episodes this year  . Complication of  anesthesia   . DJD (degenerative joint disease) of knee 2018  . Fall   . Fatigue   . History of blood transfusion   . Hypertension   . MS (multiple sclerosis) ([place])   . Myelitis ([place])   .  Neuromuscular disorder ([place]) 2001   multiple sclerosis diagnosed by MRI  . PONV (postoperative nausea and vomiting)    severe  . Thrombocytopenia ([place])     Past Surgical History:  Procedure Laterality Date  . ABDOMINAL HYSTERECTOMY  2011   required wound vac for 6 weeks. ovaries had grown attached to her back bone  . BRAIN SURGERY  2015   tumor resection-denies seizures  . BREAST SURGERY Left 2014   biopsy. negative  . HARDWARE REMOVAL Left 05/05/2018   Procedure: HARDWARE REMOVAL- LEFT ANKLE;  Surgeon: Hessie Knows, MD;  Location: [facility] ORS;  Service: Orthopedics;  Laterality: Left;  . I & D EXTREMITY Left 08/25/2015   Procedure: IRRIGATION AND DEBRIDEMENT THUMB;  Surgeon: Iran Planas, MD;  Location: [place];  Service: Orthopedics;  Laterality: Left;  . ORIF ANKLE FRACTURE Left 02/20/2018   Procedure: OPEN REDUCTION INTERNAL FIXATION (ORIF) ANKLE FRACTURE;  Surgeon: Hessie Knows, MD;  Location: [facility] OR[facility];  Service: Orthopedics;  Laterality: Left;    Social History   Socioeconomic History  . Marital status: Single    Spouse name: Not on file  . Number of children: Not on file  . Years of education: high school, some college   . Highest education level: High school graduate  Occupational History  . 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 Smoking status: Never Smoker  . Smokeless tobacco: Never Used  Vaping Use  . Vaping Use: Never used  Substance and Sexual Activity  . Alcohol use: Yes    Comment: occassional  . Drug use: Yes    Types: Marijuana    Comment: uses for MS  . Sexual activity: Not on file  Other Topics Concern  . Not on file  Social History Narrative   [employer] at [place] Strain:   . Difficulty of Paying Living Expenses: Not on file  Food Insecurity:   . Worried About Charity fundraiser in the Last Year: Not on file  . Ran Out of Food in the Last Year: Not on file   Transportation Needs:   . Lack of Transportation (Medical): Not on file  . Lack of Transportation (Non-Medical): Not on file  Physical Activity:   . Days of Exercise per Week: Not on file  . Minutes of Exercise per Session: Not on file  Stress:   . Feeling of Stress : Not on file  Social Connections:   . Frequency of Communication with Friends and Family: Not on file  . Frequency of Social Gatherings with Friends and Family: Not on file  . Attends Religious Services: Not on file  . Active Member of Clubs or Organizations: Not on file  . Attends Archivist Meetings: Not on file  . Marital Status: Not on file  Intimate Partner Violence:   . Fear of Current or Ex-Partner: Not on file  . Emotionally Abused: Not on file  . Physically Abused: Not on file  . Sexually Abused: Not on file    Family History  Problem Relation Age of Onset  . Heart disease Father   . Cancer Father  Current Outpatient Medications:  .  Cholecalciferol (VITAMIN D-3) 25 MCG (1000 UT) CAPS, Take 1,000 Units by mouth daily., Disp: , Rfl:  .  ezetimibe (ZETIA) 10 MG tablet, Take 10 mg by mouth daily., Disp: , Rfl:  .  fluticasone (FLONASE) 50 MCG/ACT nasal spray, Place into the nose., Disp: , Rfl:  .  hydrochlorothiazide (HYDRODIURIL) 25 MG tablet, Take 1 tablet (25 mg total) by mouth daily., Disp: 90 tablet, Rfl: 4 .  ibuprofen (IBU) 800 MG tablet, Take 1 tablet (800 mg total) by mouth every 8 (eight) hours as needed for moderate pain., Disp: 30 tablet, Rfl: 6 .  losartan (COZAAR) 100 MG tablet, Take 1 tablet (100 mg total) by mouth daily., Disp: 90 tablet, Rfl: 4 .  Melatonin 3 MG TABS, Take 3-6 mg by mouth at bedtime., Disp: , Rfl:  .  niacin 100 MG tablet, Take 100 mg by mouth at bedtime., Disp: , Rfl:  .  vitamin B-12 (CYANOCOBALAMIN) 1000 MCG tablet, Take 1,000 mcg by mouth daily., Disp: , Rfl:   No results found.  No images are attached to the encounter.   CMP Latest Ref Rng & Units  01/04/2019  Glucose 70 - 99 mg/dL 97  BUN 8 - 23 mg/dL 15  Creatinine 0.44 - 1.00 mg/dL 0.83  Sodium 135 - 145 mmol/L 140  Potassium 3.5 - 5.1 mmol/L 3.8  Chloride 98 - 111 mmol/L 103  CO2 22 - 32 mmol/L 28  Calcium 8.9 - 10.3 mg/dL 9.0  Total Protein 6.5 - 8.1 g/dL 7.3  Total Bilirubin 0.3 - 1.2 mg/dL 0.6  Alkaline Phos 38 - 126 U/L 70  AST 15 - 41 U/L 15  ALT 0 - 44 U/L 14   CBC Latest Ref Rng & Units 01/21/2020  WBC 4.0 - 10.5 K/uL 7.5  Hemoglobin 12.0 - 15.0 g/dL 11.9(L)  Hematocrit 36 - 46 % 36.5  Platelets 150 - 400 K/uL 62(L)     Assessment and plan: Patient is a 69 year old female with history of chronic ITP and this is a routine follow-up visit.  Patient has chronic isolated thrombocytopenia with a platelet count fluctuates between fifties to sixties at least dating back to 2019 and has remained stable around that range.  Given the stability of her counts I would like to repeat her CBC with differential in 6 months in 1 year and see her back in 1 year  Follow-up instructions: As above  I discussed the assessment and treatment plan with the patient. The patient was provided an opportunity to ask questions and all were answered. The patient agreed with the plan and demonstrated an understanding of the instructions.   The patient was advised to call back or seek an in-person evaluation if the symptoms worsen or if the condition fails to improve as anticipated.    Visit Diagnosis: 1. Chronic ITP (idiopathic thrombocytopenia) (HCC)     Dr. Randa Evens, MD, MPH [facility] Tel- [phone] 01/28/2020 8:43 AM

## 2020-03-14 DIAGNOSIS — Z1231 Encounter for screening mammogram for malignant neoplasm of breast: Secondary | ICD-10-CM | POA: Diagnosis not present

## 2020-03-23 ENCOUNTER — Ambulatory Visit: Payer: Medicare Other | Attending: Neurology

## 2020-03-23 DIAGNOSIS — G4733 Obstructive sleep apnea (adult) (pediatric): Secondary | ICD-10-CM | POA: Diagnosis not present

## 2020-03-23 DIAGNOSIS — G4761 Periodic limb movement disorder: Secondary | ICD-10-CM | POA: Diagnosis not present

## 2020-03-24 ENCOUNTER — Other Ambulatory Visit: Payer: Self-pay

## 2020-03-25 DIAGNOSIS — G4733 Obstructive sleep apnea (adult) (pediatric): Secondary | ICD-10-CM | POA: Diagnosis not present

## 2020-04-19 DIAGNOSIS — Z789 Other specified health status: Secondary | ICD-10-CM | POA: Diagnosis not present

## 2020-04-19 DIAGNOSIS — E782 Mixed hyperlipidemia: Secondary | ICD-10-CM | POA: Diagnosis not present

## 2020-04-19 DIAGNOSIS — G35 Multiple sclerosis: Secondary | ICD-10-CM | POA: Diagnosis not present

## 2020-04-19 DIAGNOSIS — G4733 Obstructive sleep apnea (adult) (pediatric): Secondary | ICD-10-CM | POA: Diagnosis not present

## 2020-04-19 DIAGNOSIS — Z6841 Body Mass Index (BMI) 40.0 and over, adult: Secondary | ICD-10-CM | POA: Diagnosis not present

## 2020-04-19 DIAGNOSIS — I10 Essential (primary) hypertension: Secondary | ICD-10-CM | POA: Diagnosis not present

## 2020-05-01 DIAGNOSIS — Z23 Encounter for immunization: Secondary | ICD-10-CM | POA: Diagnosis not present

## 2020-05-09 DIAGNOSIS — M542 Cervicalgia: Secondary | ICD-10-CM | POA: Diagnosis not present

## 2020-05-09 DIAGNOSIS — R293 Abnormal posture: Secondary | ICD-10-CM | POA: Diagnosis not present

## 2020-05-09 DIAGNOSIS — M436 Torticollis: Secondary | ICD-10-CM | POA: Diagnosis not present

## 2020-05-16 DIAGNOSIS — M542 Cervicalgia: Secondary | ICD-10-CM | POA: Diagnosis not present

## 2020-05-19 DIAGNOSIS — M542 Cervicalgia: Secondary | ICD-10-CM | POA: Diagnosis not present

## 2020-05-23 DIAGNOSIS — M542 Cervicalgia: Secondary | ICD-10-CM | POA: Diagnosis not present

## 2020-06-07 DIAGNOSIS — M542 Cervicalgia: Secondary | ICD-10-CM | POA: Diagnosis not present

## 2020-07-26 ENCOUNTER — Inpatient Hospital Stay: Payer: Medicare Other | Attending: Oncology

## 2020-07-26 DIAGNOSIS — D693 Immune thrombocytopenic purpura: Secondary | ICD-10-CM | POA: Insufficient documentation

## 2020-07-26 LAB — CBC WITH DIFFERENTIAL/PLATELET
Abs Immature Granulocytes: 0.05 10*3/uL (ref 0.00–0.07)
Basophils Absolute: 0.1 10*3/uL (ref 0.0–0.1)
Basophils Relative: 1 %
Eosinophils Absolute: 0.1 10*3/uL (ref 0.0–0.5)
Eosinophils Relative: 1 %
HCT: 35 % — ABNORMAL LOW (ref 36.0–46.0)
Hemoglobin: 11.9 g/dL — ABNORMAL LOW (ref 12.0–15.0)
Immature Granulocytes: 1 %
Lymphocytes Relative: 15 %
Lymphs Abs: 1.6 10*3/uL (ref 0.7–4.0)
MCH: 29.7 pg (ref 26.0–34.0)
MCHC: 34 g/dL (ref 30.0–36.0)
MCV: 87.3 fL (ref 80.0–100.0)
Monocytes Absolute: 0.7 10*3/uL (ref 0.1–1.0)
Monocytes Relative: 6 %
Neutro Abs: 8.3 10*3/uL — ABNORMAL HIGH (ref 1.7–7.7)
Neutrophils Relative %: 76 %
Platelets: 67 10*3/uL — ABNORMAL LOW (ref 150–400)
RBC: 4.01 MIL/uL (ref 3.87–5.11)
RDW: 14.3 % (ref 11.5–15.5)
WBC: 10.8 10*3/uL — ABNORMAL HIGH (ref 4.0–10.5)
nRBC: 0 % (ref 0.0–0.2)

## 2020-09-27 ENCOUNTER — Other Ambulatory Visit: Payer: Self-pay

## 2020-09-27 ENCOUNTER — Ambulatory Visit: Payer: Medicare Other | Attending: Neurology

## 2020-09-27 VITALS — BP 152/65 | HR 75

## 2020-09-27 DIAGNOSIS — R269 Unspecified abnormalities of gait and mobility: Secondary | ICD-10-CM | POA: Diagnosis present

## 2020-09-27 DIAGNOSIS — M6281 Muscle weakness (generalized): Secondary | ICD-10-CM | POA: Diagnosis present

## 2020-09-27 DIAGNOSIS — R262 Difficulty in walking, not elsewhere classified: Secondary | ICD-10-CM | POA: Diagnosis present

## 2020-09-27 DIAGNOSIS — R278 Other lack of coordination: Secondary | ICD-10-CM | POA: Insufficient documentation

## 2020-09-27 NOTE — Therapy (Incomplete)
[place] MAIN [facility] SERVICES [address] [place], [place], [address] Phone: [phone]   Fax:  [phone]  Physical Therapy Evaluation  Patient Details  Name: Leslie Smith MRN: [id number] Date of Birth: May 06, 1951 No data recorded  Encounter Date: 09/27/2020    Past Medical History:  Diagnosis Date  . 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 Bladder incontinence   . Cancer [facility]) 2014   brain tumor, resected  . Cellulitis and abscess of leg 2019   2 episodes this year  . Complication of anesthesia   . DJD (degenerative joint disease) of knee 2018  . Fall   . Fatigue   . History of blood transfusion   . Hypertension   . MS (multiple sclerosis) ([place])   . Myelitis ([place])   . Neuromuscular disorder ([place]) 2001   multiple sclerosis diagnosed by MRI  . PONV (postoperative nausea and vomiting)    severe  . Thrombocytopenia ([place])     Past Surgical History:  Procedure Laterality Date  . ABDOMINAL HYSTERECTOMY  2011   required wound vac for 6 weeks. ovaries had grown attached to her back bone  . BRAIN SURGERY  2015   tumor resection-denies seizures  . BREAST SURGERY Left 2014   biopsy. negative  . HARDWARE REMOVAL Left 05/05/2018   Procedure: HARDWARE REMOVAL- LEFT ANKLE;  Surgeon: Hessie Knows, MD;  Location: [facility] OR[facility];  Service: Orthopedics;  Laterality: Left;  . I & D EXTREMITY Left 08/25/2015   Procedure: IRRIGATION AND DEBRIDEMENT THUMB;  Surgeon: Iran Planas, MD;  Location: [place];  Service: Orthopedics;  Laterality: Left;  . ORIF ANKLE FRACTURE Left 02/20/2018   Procedure: OPEN REDUCTION INTERNAL FIXATION (ORIF) ANKLE FRACTURE;  Surgeon: Hessie Knows, MD;  Location: [facility] ORS;  Service: Orthopedics;  Laterality: Left;    There were no vitals filed for this visit.                    Objective measurements completed on examination: See above findings.                              Patient will benefit from skilled therapeutic intervention in order to improve the following deficits and impairments:     Visit Diagnosis: No diagnosis found.     Problem List Patient Active Problem List   Diagnosis Date Noted  . S/P ORIF (open reduction internal fixation) fracture 02/20/2018  . DJD (degenerative joint disease) of knee 01/02/2017  . Advanced care planning/counseling discussion 01/02/2017  . Thrombocytopenia ([place]) 10/30/2015  . Multiple sclerosis ([place])   . Essential hypertension   . Hyperlipidemia     Lewis Moccasin 09/27/2020, 10:12 AM  [place] MAIN [facility] SERVICES [address] [place], [place], [address] Phone: [phone]   Fax:  [phone]  Name: Leslie Smith MRN: [id number] Date of Birth: 11-20-1950

## 2020-09-27 NOTE — Therapy (Signed)
[place] MAIN [facility] SERVICES [address] [place], [place], [address] Phone: [phone]   Fax:  [phone]  Physical Therapy Evaluation  Patient Details  Name: Leslie Smith MRN: [id number] Date of Birth: 1951/01/21 Referring Provider (PT): Dr. Darlin Priestly   Encounter Date: 09/27/2020   PT End of Session - 09/27/20 1557    Visit Number 1    Number of Visits 25    Date for PT Re-Evaluation 12/20/20    Authorization Time Period 09/27/2020-12/20/2020    PT Start Time 1010    PT Stop Time 1059    PT Time Calculation (min) 49 min    Equipment Utilized During Treatment Gait belt    Activity Tolerance Patient tolerated treatment well    Behavior During Therapy [facility] for tasks assessed/performed           Past Medical History:  Diagnosis Date  . 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 Bladder incontinence   . Cancer [facility]) 2014   brain tumor, resected  . Cellulitis and abscess of leg 2019   2 episodes this year  . Complication of anesthesia   . DJD (degenerative joint disease) of knee 2018  . Fall   . Fatigue   . History of blood transfusion   . Hypertension   . MS (multiple sclerosis) ([place])   . Myelitis ([place])   . Neuromuscular disorder ([place]) 2001   multiple sclerosis diagnosed by MRI  . PONV (postoperative nausea and vomiting)    severe  . Thrombocytopenia ([place])     Past Surgical History:  Procedure Laterality Date  . ABDOMINAL HYSTERECTOMY  2011   required wound vac for 6 weeks. ovaries had grown attached to her back bone  . BRAIN SURGERY  2015   tumor resection-denies seizures  . BREAST SURGERY Left 2014   biopsy. negative  . HARDWARE REMOVAL Left 05/05/2018   Procedure: HARDWARE REMOVAL- LEFT ANKLE;  Surgeon: Hessie Knows, MD;  Location: [facility] ORS;  Service: Orthopedics;  Laterality: Left;  . I & D EXTREMITY Left 08/25/2015   Procedure: IRRIGATION AND DEBRIDEMENT THUMB;  Surgeon: Iran Planas, MD;  Location: [place];  Service:  Orthopedics;  Laterality: Left;  . ORIF ANKLE FRACTURE Left 02/20/2018   Procedure: OPEN REDUCTION INTERNAL FIXATION (ORIF) ANKLE FRACTURE;  Surgeon: Hessie Knows, MD;  Location: [facility] ORS;  Service: Orthopedics;  Laterality: Left;    Vitals:   09/27/20 1549  BP: (!) 152/65  Pulse: 75  SpO2: 97%      Subjective Assessment - 09/27/20 1550    Subjective Patient reports progressive weakness in lower extremities and impaired balance- reports some furniture walking and unsteadiness yet denies any falls. Patient reports she was going to begin outpatient back in 2020 but cancelled due to [place]. She reports she is very independent and lives alone and that her goal is to walk better and regain some strength.    Pertinent History Patient diagnosed with MS in 2001. Past medical history significant for HTN, MS, DJD right knee, Hyperlipidemia, Thrombocytopenia, S/p ORIF Left ankle fracture.    Limitations Lifting;Standing;Walking;House hold activities    How long can you sit comfortably? No restrictions    How long can you stand comfortably? 30 min maybe    How long can you walk comfortably? maybe 15 min    Patient Stated Goals Walk better and regain some lost strength.    Currently in Pain? Yes    Pain Score 4  Pain Location Knee    Pain Orientation Right    Pain Descriptors / Indicators Aching    Pain Type Chronic pain    Pain Onset More than a month ago    Pain Frequency Constant    Aggravating Factors  increased walking    Pain Relieving Factors Rest    Effect of Pain on Daily Activities Difficulty with standing activities    Multiple Pain Sites No                     [facility] PT Assessment - 09/27/20 1035      Assessment   Medical Diagnosis Multiple Sclerosis    Referring Provider (PT) Dr. Darlin Priestly    Onset Date/Surgical Date 06/10/18    Hand Dominance Right    Next MD Visit 11/01/2020   Dr. Fulton Reek   Prior Therapy no      Precautions   Precautions Fall       Restrictions   Weight Bearing Restrictions No      Balance Screen   Has the patient fallen in the past 6 months No    Has the patient had a decrease in activity level because of a fear of falling?  No    Is the patient reluctant to leave their home because of a fear of falling?  Yes      [place] residence    Available Help at Discharge Family;Friend(s)    Type of [place] to enter;Ramped entrance    Entrance Stairs-Number of Steps ramp in front    Home Layout Two level   But only uses 1 level   Alternate Level Stairs-Number of Steps 4    Alternate Level Stairs-Rails Can reach both    [place] - 2 wheels;Cane - single point;Shower seat;Bedside commode;Grab bars - toilet      Prior Function   Level of Independence Independent      Cognition   Overall Cognitive Status Within Functional Limits for tasks assessed    Attention Focused    Focused Attention Appears intact    Memory Appears intact    Awareness Appears intact    Problem Solving Appears intact    Executive Function Reasoning;Sequencing;Organizing;Decision Making;Initiating;Self Monitoring;Self Correcting    Reasoning Appears intact    Sequencing Appears intact    Organizing Appears intact    Decision Making Appears intact    Initiating Appears intact    Self Monitoring Appears intact    Self Correcting Appears intact      Observation/Other Assessments   Focus on Therapeutic Outcomes (FOTO)  9           SUBJECTIVE Chief complaint: Impaired balance and LE weakness Onset: MS since 2001 yet worsened in 2020 but unable to come to outpatient due to [place]. Recent changes in overall health/medication: Yes, Progressive LE weakness and difficulty walking. Directional pattern for falls: No recent falls but does report imbalance Prior history of physical therapy for balance: None Follow-up appointment with MD: PCP- Dr. Earnest Bailey in May Red flags  None  OBJECTIVE  MUSCULOSKELETAL: Tremor: Absent Bulk: Normal Tone: Normal, no clonus  Posture No gross abnormalities noted in standing or seated posture  Gait Patient ambulated in clinic without an AD with widened BOS, short reciprocal steps  Strength R/L 3+/3+ Hip flexion 3+/3+ Hip external rotation 3+/3+ Hip internal rotation 3+/3+ Hip extension  3+/3+ Hip abduction 3+/3+ Hip  adduction 4/4 Knee extension 4/4 Knee flexion 4/4 Ankle Plantarflexion 4/4 Ankle Dorsiflexion   NEUROLOGICAL:  Mental Status Patient is oriented to person, place and time.  Recent memory is intact.  Remote memory is intact.  Attention span and concentration are intact.  Expressive speech is intact.   Sensation Grossly intact to light touch bilateral UEs/LEs as determined by testing dermatomes C2-T2/L2-S2 respectively. Patient does report some decreased sensation to light touch in bilateral ankles/feet Proprioception and hot/cold testing deferred on this date  Coordination/Cerebellar Finger to Nose: WNL Heel to Shin: WNL Rapid alternating movements: WNL Finger Opposition: WNL    FUNCTIONAL OUTCOME MEASURES   Results Comments      DGI 18/24       TUG 15.15 Seconds No AD  5TSTS  23 seconds No AD  6 Minute Walk Test To be tested next visit   10 Meter Gait Speed Self-selected: 11.98 s = 0.83 m/s Below normative values for full community ambulation  ABC Scale 68.1%     ASSESSMENT Clinical Impression: Pt is a 29 pleasant year-old female referred for imbalance and falls related to diagnosis of Multiple sclerosis. PT  examination reveals deficits . Pt presents with deficits in BLE strength, gait and balance. Patient is currently walking without an assistive device but presents with decreased gait speed and impaired balance as seen by DGI score of 18/24.  Pt will benefit from skilled PT services to address deficits in balance and decrease risk for future falls and improve her quality of  life.               Objective measurements completed on examination: See above findings.               PT Education - 09/27/20 1557    Education Details plan of care    Person(s) Educated Patient    Methods Explanation    Comprehension Verbalized understanding            PT Short Term Goals - 09/27/20 1245      PT SHORT TERM GOAL #1   Title Pt will be independent with HEP in order to improve strength and balance in order to decrease fall risk and improve function at home and work.    Baseline 09/27/2020- Patient has no formal HEP in place    Time 6    Period Weeks    Status New    Target Date 11/08/20             PT Long Term Goals - 09/27/20 1245      PT LONG TERM GOAL #1   Title Pt will improve FOTO score from 61  to target score of 64 to display perceived improvements in ability to complete ADL's and functional mobility activities.    Baseline 09/27/2020: FOTO=61    Time 12    Period Weeks    Status New    Target Date 12/20/20      PT LONG TERM GOAL #2   Title Pt will improve DGI by at least 3 points in order to demonstrate clinically significant improvement in balance and decreased risk for falls.    Baseline 09/27/2020-DGI=18/24    Time 12    Period Weeks    Status New    Target Date 12/20/20      PT LONG TERM GOAL #3   Title Pt will improve ABC by at least 10 % in order to demonstrate clinically significant improvement in balance confidence.  Baseline 09/27/2020-68.1%    Time 12    Period Weeks    Status New    Target Date 12/20/20      PT LONG TERM GOAL #4   Title Pt will decrease 5TSTS by at least 5 seconds (initial =23 sec with no UE support) in order to demonstrate clinically significant improvement in LE strength.    Baseline 09/27/2020= 23 sec. with No UE support    Time 12    Period Weeks    Status New    Target Date 12/20/20      PT LONG TERM GOAL #5   Title Pt will decrease TUG to below 14 seconds/decrease in order to  demonstrate decreased fall risk.    Baseline 09/27/2020- TUG score= 15.15 sec without UE support    Time 12    Period Weeks    Status New    Target Date 12/20/20                  Plan - 09/28/20 2353    Clinical Impression Statement Pt is a 39 pleasant year-old female referred for imbalance and falls related to diagnosis of Multiple sclerosis. PT  examination reveals deficits . Pt presents with deficits in BLE strength, gait and balance. Patient is currently walking without an assistive device but presents with decreased gait speed and impaired balance as seen by DGI score of 18/24.  Pt will benefit from skilled PT services to address deficits in balance and decrease risk for future falls and improve her quality of life.    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, mining    Rehab Potential Good    PT Frequency 2x / week    PT Duration 12 weeks    PT Treatment/Interventions ADLs/Self Care Home Management;Cryotherapy;Moist Heat;DME Instruction;Gait training;Stair training;Functional mobility training;Therapeutic activities;Therapeutic exercise;Balance training;Neuromuscular re-education;Patient/family education;Manual techniques;Passive range of motion    PT Next Visit Plan Instruct in initial LE stretngthening and balance exercises and add to HEP; Complete 6 min walk test and add goal    PT Home Exercise Plan To be initiated next 1-2 sessions    Consulted and Agree with Plan of Care Patient           Patient will benefit from skilled therapeutic intervention in order to improve the following deficits and impairments:  Abnormal gait,Cardiopulmonary status limiting activity,Decreased activity  tolerance,Decreased balance,Decreased coordination,Decreased endurance,Decreased mobility,Decreased range of motion,Decreased strength,Difficulty walking,Impaired flexibility,Obesity,Pain  Visit Diagnosis: Abnormality of gait and mobility  Difficulty in walking, not elsewhere classified  Muscle weakness (generalized)  Other lack of coordination     Problem List Patient Active Problem List   Diagnosis Date Noted  . S/P ORIF (open reduction internal fixation) fracture 02/20/2018  . DJD (degenerative joint disease) of knee 01/02/2017  . Advanced care planning/counseling discussion 01/02/2017  . Thrombocytopenia ([place]) 10/30/2015  . Multiple sclerosis ([place])   . Essential hypertension   . Hyperlipidemia     Lewis Moccasin, PT 09/28/2020, 8:09 AM  [place] MAIN [facility] SERVICES [address] [place], [place], [address] Phone: [phone]   Fax:  [phone]  Name: LANYLA COSTELLO MRN: [id number] Date of Birth: 05/09/1951

## 2020-10-03 ENCOUNTER — Other Ambulatory Visit: Payer: Self-pay

## 2020-10-03 ENCOUNTER — Ambulatory Visit: Payer: Medicare Other

## 2020-10-03 DIAGNOSIS — M6281 Muscle weakness (generalized): Secondary | ICD-10-CM

## 2020-10-03 DIAGNOSIS — R278 Other lack of coordination: Secondary | ICD-10-CM

## 2020-10-03 DIAGNOSIS — R262 Difficulty in walking, not elsewhere classified: Secondary | ICD-10-CM

## 2020-10-03 DIAGNOSIS — R269 Unspecified abnormalities of gait and mobility: Secondary | ICD-10-CM

## 2020-10-03 NOTE — Therapy (Signed)
[place] MAIN [facility] SERVICES [address] [place], [place], [address] Phone: [phone]   Fax:  [phone]  Physical Therapy Treatment  Patient Details  Name: Leslie Smith MRN: [id number] Date of Birth: 07-04-1950 Referring Provider (PT): Dr. Darlin Priestly   Encounter Date: 10/03/2020   PT End of Session - 10/03/20 1524    Visit Number 2    Number of Visits 25    Date for PT Re-Evaluation 12/20/20    Authorization Time Period 09/27/2020-12/20/2020    PT Start Time 1345    PT Stop Time 1429    PT Time Calculation (min) 44 min    Equipment Utilized During Treatment Gait belt    Activity Tolerance Patient tolerated treatment well    Behavior During Therapy [facility] for tasks assessed/performed           Past Medical History:  Diagnosis Date  . 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 Bladder incontinence   . Cancer [facility]) 2014   brain tumor, resected  . Cellulitis and abscess of leg 2019   2 episodes this year  . Complication of anesthesia   . DJD (degenerative joint disease) of knee 2018  . Fall   . Fatigue   . History of blood transfusion   . Hypertension   . MS (multiple sclerosis) ([place])   . Myelitis ([place])   . Neuromuscular disorder ([place]) 2001   multiple sclerosis diagnosed by MRI  . PONV (postoperative nausea and vomiting)    severe  . Thrombocytopenia ([place])     Past Surgical History:  Procedure Laterality Date  . ABDOMINAL HYSTERECTOMY  2011   required wound vac for 6 weeks. ovaries had grown attached to her back bone  . BRAIN SURGERY  2015   tumor resection-denies seizures  . BREAST SURGERY Left 2014   biopsy. negative  . HARDWARE REMOVAL Left 05/05/2018   Procedure: HARDWARE REMOVAL- LEFT ANKLE;  Surgeon: Hessie Knows, MD;  Location: [facility] ORS;  Service: Orthopedics;  Laterality: Left;  . I & D EXTREMITY Left 08/25/2015   Procedure: IRRIGATION AND DEBRIDEMENT THUMB;  Surgeon: Iran Planas, MD;  Location: [place];  Service:  Orthopedics;  Laterality: Left;  . ORIF ANKLE FRACTURE Left 02/20/2018   Procedure: OPEN REDUCTION INTERNAL FIXATION (ORIF) ANKLE FRACTURE;  Surgeon: Hessie Knows, MD;  Location: [facility] ORS;  Service: Orthopedics;  Laterality: Left;    There were no vitals filed for this visit.   Subjective Assessment - 10/03/20 1347    Subjective Had a bad day Sunday was very shaky feeling better now. No falls or LOB since last session.    Pertinent History Patient diagnosed with MS in 2001. Past medical history significant for HTN, MS, DJD right knee, Hyperlipidemia, Thrombocytopenia, S/p ORIF Left ankle fracture.    Limitations Lifting;Standing;Walking;House hold activities    How long can you sit comfortably? No restrictions    How long can you stand comfortably? 30 min maybe    How long can you walk comfortably? maybe 15 min    Patient Stated Goals Walk better and regain some lost strength.    Currently in Pain? Yes    Pain Score 4     Pain Location Knee    Pain Orientation Right    Pain Descriptors / Indicators Aching    Pain Type Chronic pain    Pain Onset More than a month ago    Pain Frequency Constant  Had a bad day Sunday was very shaky feeling better now. No falls or LOB since last session.    Goal:  BP prior to 6 min walk test: 149/59 after 6 min walk test 206/60 6 min walk: 695 ft with one seated rest break   Treat:    Standing with CGA next to support surface:  Airex pad: static stand 30 seconds x 2 trials, noticeable trembling of ankles/LE's with fatigue and challenge to maintain stability Airex pad: horizontal head turns 30 seconds scanning room 10x ; cueing for arc of motion  Airex pad: vertical head turns 30 seconds, cueing for arc of motion, noticeable sway with upward gaze increasing demand on ankle righting reaction musculature Airex pad: one foot on 6" step one foot on airex pad, hold position for 30 seconds, switch legs, 2x each LE;   HEP education  and performance:    Access Code: [email or web address] URL: [email or web address] Date: 10/03/2020 Prepared by: Janna Arch  Exercises Standing March with Counter Support - 1 x daily - 7 x weekly - 2 sets - 10 reps - 5 hold Sit to Stand Without Arm Support - 1 x daily - 7 x weekly - 2 sets - 10 reps - 5 hold Standing Hip Extension with Counter Support - 1 x daily - 7 x weekly - 2 sets - 10 reps - 5 hold Heel Toe Raises with Counter Support - 1 x daily - 7 x weekly - 2 sets - 10 reps - 5 hold Seated Long Arc Quad - 1 x daily - 7 x weekly - 2 sets - 10 reps - 5 hold  Patient is eager to learn HEP as well as perform 6 minute walk test and stability interventions. She is limited by capacity for functional mobility as well as shortness of breath with 6 minute walk test. She has limited ankle righting reactions with unstable surfaces and requires cueing for breathing techniques for reduction of SOB. Pt will benefit from skilled PT services to address deficits in balance and decrease risk for future falls and improve her quality of life.            PT Education - 10/03/20 1523    Education Details HEP, body mechanics    Person(s) Educated Patient    Methods Explanation;Demonstration;Tactile cues;Verbal cues    Comprehension Verbalized understanding;Returned demonstration;Verbal cues required;Tactile cues required            PT Short Term Goals - 09/27/20 1245      PT SHORT TERM GOAL #1   Title Pt will be independent with HEP in order to improve strength and balance in order to decrease fall risk and improve function at home and work.    Baseline 09/27/2020- Patient has no formal HEP in place    Time 6    Period Weeks    Status New    Target Date 11/08/20             PT Long Term Goals - 10/03/20 1607      PT LONG TERM GOAL #1   Title Pt will improve FOTO score from 61  to target score of 64 to display perceived improvements in ability to complete ADL's and functional  mobility activities.    Baseline 09/27/2020: FOTO=61    Time 12    Period Weeks    Status New    Target Date 12/20/20      PT LONG TERM GOAL #2   Title Pt will improve DGI  by at least 3 points in order to demonstrate clinically significant improvement in balance and decreased risk for falls.    Baseline 09/27/2020-DGI=18/24    Time 12    Period Weeks    Status New    Target Date 12/20/20      PT LONG TERM GOAL #3   Title Pt will improve ABC by at least 10 % in order to demonstrate clinically significant improvement in balance confidence.    Baseline 09/27/2020-68.1%    Time 12    Period Weeks    Status New    Target Date 12/20/20      PT LONG TERM GOAL #4   Title Pt will decrease 5TSTS by at least 5 seconds (initial =23 sec with no UE support) in order to demonstrate clinically significant improvement in LE strength.    Baseline 09/27/2020= 23 sec. with No UE support    Time 12    Period Weeks    Status New    Target Date 12/20/20      PT LONG TERM GOAL #5   Title Pt will decrease TUG to below 14 seconds/decrease in order to demonstrate decreased fall risk.    Baseline 09/27/2020- TUG score= 15.15 sec without UE support    Time 12    Period Weeks    Status New    Target Date 12/20/20      Additional Long Term Goals   Additional Long Term Goals Yes      PT LONG TERM GOAL #6   Title Patient will increase six minute walk test distance to >1000 for progression to community ambulator and improve gait ability    Baseline 4/26: 695 ft with one seated rest break    Time 12    Period Weeks    Status New    Target Date 12/20/20                 Plan - 10/03/20 1606    Clinical Impression Statement Patient is eager to learn HEP as well as perform 6 minute walk test and stability interventions. She is limited by capacity for functional mobility as well as shortness of breath with 6 minute walk test. She has limited ankle righting reactions with unstable surfaces and requires  cueing for breathing techniques for reduction of SOB. Pt will benefit from skilled PT services to address deficits in balance and decrease risk for future falls and improve her quality of life.    Personal Factors and Comorbidities Comorbidity 3+    Comorbidities HTN, MS, Arthritis, COPD    Examination-Activity Limitations Bend;Carry;Continence;Lift;Squat;Stairs;Stand    Examination-Participation Restrictions Community Activity;Yard Work    Stability/Clinical Decision Making Stable/Uncomplicated    Rehab Potential Good    PT Frequency 2x / week    PT Duration 12 weeks    PT Treatment/Interventions ADLs/Self Care Home Management;Cryotherapy;Moist Heat;DME Instruction;Gait training;Stair training;Functional mobility training;Therapeutic activities;Therapeutic exercise;Balance training;Neuromuscular re-education;Patient/family education;Manual techniques;Passive range of motion    PT Next Visit Plan Instruct in initial LE stretngthening and balance exercises and add to HEP; Complete 6 min walk test and add goal    PT Home Exercise Plan To be initiated next 1-2 sessions    Consulted and Agree with Plan of Care Patient           Patient will benefit from skilled therapeutic intervention in order to improve the following deficits and impairments:  Abnormal gait,Cardiopulmonary status limiting activity,Decreased activity tolerance,Decreased balance,Decreased coordination,Decreased endurance,Decreased mobility,Decreased range of motion,Decreased strength,Difficulty walking,Impaired flexibility,Obesity,Pain  Visit Diagnosis: Abnormality of gait and mobility  Difficulty in walking, not elsewhere classified  Muscle weakness (generalized)  Other lack of coordination     Problem List Patient Active Problem List   Diagnosis Date Noted  . S/P ORIF (open reduction internal fixation) fracture 02/20/2018  . DJD (degenerative joint disease) of knee 01/02/2017  . Advanced care planning/counseling  discussion 01/02/2017  . Thrombocytopenia ([place]) 10/30/2015  . Multiple sclerosis ([place])   . Essential hypertension   . Hyperlipidemia    Janna Arch, PT, DPT   10/03/2020, 4:09 PM  [place] MAIN [facility] SERVICES [address] [place], [place], [address] Phone: [phone]   Fax:  [phone]  Name: Leslie Smith MRN: [id number] Date of Birth: [birth year]

## 2020-10-05 ENCOUNTER — Ambulatory Visit: Payer: Medicare Other

## 2020-10-05 ENCOUNTER — Other Ambulatory Visit: Payer: Self-pay

## 2020-10-05 DIAGNOSIS — R269 Unspecified abnormalities of gait and mobility: Secondary | ICD-10-CM | POA: Diagnosis not present

## 2020-10-05 DIAGNOSIS — R262 Difficulty in walking, not elsewhere classified: Secondary | ICD-10-CM

## 2020-10-05 DIAGNOSIS — M6281 Muscle weakness (generalized): Secondary | ICD-10-CM

## 2020-10-05 NOTE — Therapy (Signed)
[place] MAIN [facility] SERVICES [address] [place], [place], [address] Phone: [phone]   Fax:  [phone]  Physical Therapy Treatment  Patient Details  Name: Leslie Smith MRN: [id number] Date of Birth: Mar 09, 1951 Referring Provider (PT): Dr. Darlin Priestly   Encounter Date: 10/05/2020   PT End of Session - 10/05/20 1732    Visit Number 3    Number of Visits 25    Date for PT Re-Evaluation 12/20/20    Authorization Time Period 09/27/2020-12/20/2020    PT Start Time 1345    PT Stop Time 1429    PT Time Calculation (min) 44 min    Equipment Utilized During Treatment Gait belt    Activity Tolerance Patient tolerated treatment well    Behavior During Therapy [facility] for tasks assessed/performed           Past Medical History:  Diagnosis Date  . 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) 09/15/2014  . Bladder incontinence   . Cancer [facility]) 2014   brain tumor, resected  . Cellulitis and abscess of leg 2019   2 episodes this year  . Complication of anesthesia   . DJD (degenerative joint disease) of knee 2018  . Fall   . Fatigue   . History of blood transfusion   . Hypertension   . MS (multiple sclerosis) ([place])   . Myelitis ([place])   . Neuromuscular disorder ([place]) 2001   multiple sclerosis diagnosed by MRI  . PONV (postoperative nausea and vomiting)    severe  . Thrombocytopenia ([place])     Past Surgical History:  Procedure Laterality Date  . ABDOMINAL HYSTERECTOMY  2011   required wound vac for 6 weeks. ovaries had grown attached to her back bone  . BRAIN SURGERY  2015   tumor resection-denies seizures  . BREAST SURGERY Left 2014   biopsy. negative  . HARDWARE REMOVAL Left 05/05/2018   Procedure: HARDWARE REMOVAL- LEFT ANKLE;  Surgeon: Hessie Knows, MD;  Location: [facility];  Service: Orthopedics;  Laterality: Left;  . I & D EXTREMITY Left 08/25/2015   Procedure: IRRIGATION AND DEBRIDEMENT THUMB;  Surgeon: Iran Planas, MD;  Location: [facility];  Service:  Orthopedics;  Laterality: Left;  . ORIF ANKLE FRACTURE Left 02/20/2018   Procedure: OPEN REDUCTION INTERNAL FIXATION (ORIF) ANKLE FRACTURE;  Surgeon: Hessie Knows, MD;  Location: [facility];  Service: Orthopedics;  Laterality: Left;    There were no vitals filed for this visit.   Subjective Assessment - 10/05/20 1731    Subjective Patient reports she was able to pick up sticks in her yard without LOB. Has been compliant with HEP.    Pertinent History Patient diagnosed with MS in 2001. Past medical history significant for HTN, MS, DJD right knee, Hyperlipidemia, Thrombocytopenia, S/p ORIF Left ankle fracture.    Limitations Lifting;Standing;Walking;House hold activities    How long can you sit comfortably? No restrictions    How long can you stand comfortably? 30 min maybe    How long can you walk comfortably? maybe 15 min    Patient Stated Goals Walk better and regain some lost strength.    Currently in Pain? Yes    Pain Score 3     Pain Location Knee    Pain Orientation Right    Pain Descriptors / Indicators Aching    Pain Type Chronic pain    Pain Onset More than a month ago    Pain Frequency Intermittent  Neuro RE-ed with close CGA and cues for safety awareness and body mechanics   Standing with CGA next to support surface:  Airex pad: static stand 30 seconds x 2 trials, noticeable trembling of ankles/LE's with fatigue and challenge to maintain stability Airex pad: horizontal head turns 30 seconds scanning room 10x ; cueing for arc of motion  Airex pad: vertical head turns 30 seconds, cueing for arc of motion, noticeable sway with upward gaze increasing demand on ankle righting reaction musculature Airex pad: one foot on yellow dynadisc one foot on airex pad, hold position for 30 seconds, switch legs, 2x each LE; One foot on dynadisc 30 seconds for modified single limb stance   In the hallway: Rainbow ball vertical toss 2x 86 ft; no LOB, good righting  reactions Rainbow ball bounce 2x 86 ft; slightly more challenging with more frequent trunk flexion  Rainbow ball horizontal pass 2x 86 ft; two episodes of swaying:   Orange hurdle lateral step over and back 10x each LE laterally     TherEx: cues for body mechanics and sequencing.  Sit to stand reach for ball and throw at target 15x  Seated adduction ball squeeze 10x  GTB alternating toe taps 2x 30 seconds  Green ball between ankles adduction with LAQ 10x      Vitals monitored throughout session with occasional rest breaks to bring HR to therapeutic range.    Patient is highly motivated throughout physical therapy session. Occasional rest breaks required due to SOB, HR, and R knee pain. Ankle righting reactions are challenging on unstable surfaces but improving compared to previous session. Pt will benefit from skilled PT services to address deficits in balance and decrease risk for future falls and improve her quality of life.            PT Education - 10/05/20 1732    Education Details body mechanics, exercise technique    Person(s) Educated Patient    Methods Explanation;Demonstration;Tactile cues;Verbal cues    Comprehension Verbalized understanding;Returned demonstration;Verbal cues required;Tactile cues required            PT Short Term Goals - 09/27/20 1245      PT SHORT TERM GOAL #1   Title Pt will be independent with HEP in order to improve strength and balance in order to decrease fall risk and improve function at home and work.    Baseline 09/27/2020- Patient has no formal HEP in place    Time 6    Period Weeks    Status New    Target Date 11/08/20             PT Long Term Goals - 10/03/20 1607      PT LONG TERM GOAL #1   Title Pt will improve FOTO score from 61  to target score of 64 to display perceived improvements in ability to complete ADL's and functional mobility activities.    Baseline 09/27/2020: FOTO=61    Time 12    Period Weeks     Status New    Target Date 12/20/20      PT LONG TERM GOAL #2   Title Pt will improve DGI by at least 3 points in order to demonstrate clinically significant improvement in balance and decreased risk for falls.    Baseline 09/27/2020-DGI=18/24    Time 12    Period Weeks    Status New    Target Date 12/20/20      PT LONG TERM GOAL #3   Title  Pt will improve ABC by at least 10 % in order to demonstrate clinically significant improvement in balance confidence.    Baseline 09/27/2020-68.1%    Time 12    Period Weeks    Status New    Target Date 12/20/20      PT LONG TERM GOAL #4   Title Pt will decrease 5TSTS by at least 5 seconds (initial =23 sec with no UE support) in order to demonstrate clinically significant improvement in LE strength.    Baseline 09/27/2020= 23 sec. with No UE support    Time 12    Period Weeks    Status New    Target Date 12/20/20      PT LONG TERM GOAL #5   Title Pt will decrease TUG to below 14 seconds/decrease in order to demonstrate decreased fall risk.    Baseline 09/27/2020- TUG score= 15.15 sec without UE support    Time 12    Period Weeks    Status New    Target Date 12/20/20      Additional Long Term Goals   Additional Long Term Goals Yes      PT LONG TERM GOAL #6   Title Patient will increase six minute walk test distance to >1000 for progression to community ambulator and improve gait ability    Baseline 4/26: 695 ft with one seated rest break    Time 12    Period Weeks    Status New    Target Date 12/20/20                 Plan - 10/05/20 1736    Clinical Impression Statement Patient is highly motivated throughout physical therapy session. Occasional rest breaks required due to SOB, HR, and R knee pain. Ankle righting reactions are challenging on unstable surfaces but improving compared to previous session. Pt will benefit from skilled PT services to address deficits in balance and decrease risk for future falls and improve her quality  of life.    Personal Factors and Comorbidities Comorbidity 3+    Comorbidities HTN, MS, Arthritis, COPD    Examination-Activity Limitations Bend;Carry;Continence;Lift;Squat;Stairs;Stand    Examination-Participation Restrictions Community Activity;Yard Work    Stability/Clinical Decision Making Stable/Uncomplicated    Rehab Potential Good    PT Frequency 2x / week    PT Duration 12 weeks    PT Treatment/Interventions ADLs/Self Care Home Management;Cryotherapy;Moist Heat;DME Instruction;Gait training;Stair training;Functional mobility training;Therapeutic activities;Therapeutic exercise;Balance training;Neuromuscular re-education;Patient/family education;Manual techniques;Passive range of motion    PT Next Visit Plan Instruct in initial LE stretngthening and balance exercises and add to HEP; Complete 6 min walk test and add goal    PT Home Exercise Plan To be initiated next 1-2 sessions    Consulted and Agree with Plan of Care Patient           Patient will benefit from skilled therapeutic intervention in order to improve the following deficits and impairments:  Abnormal gait,Cardiopulmonary status limiting activity,Decreased activity tolerance,Decreased balance,Decreased coordination,Decreased endurance,Decreased mobility,Decreased range of motion,Decreased strength,Difficulty walking,Impaired flexibility,Obesity,Pain  Visit Diagnosis: Abnormality of gait and mobility  Difficulty in walking, not elsewhere classified  Muscle weakness (generalized)     Problem List Patient Active Problem List   Diagnosis Date Noted  . S/P ORIF (open reduction internal fixation) fracture 02/20/2018  . DJD (degenerative joint disease) of knee 01/02/2017  . Advanced care planning/counseling discussion 01/02/2017  . Thrombocytopenia ([place]) 10/30/2015  . Multiple sclerosis ([place])   . Essential hypertension   . Hyperlipidemia  Janna Arch, PT, DPT   10/05/2020, 5:36 PM  [place] MAIN [facility] SERVICES [address] [place], [place], [address] Phone: [phone]   Fax:  [phone]  Name: Leslie Smith MRN: [id number] Date of Birth: 04/25/1951

## 2020-10-10 ENCOUNTER — Other Ambulatory Visit: Payer: Self-pay

## 2020-10-10 ENCOUNTER — Ambulatory Visit: Payer: Medicare Other | Attending: Neurology

## 2020-10-10 DIAGNOSIS — M6281 Muscle weakness (generalized): Secondary | ICD-10-CM | POA: Diagnosis present

## 2020-10-10 DIAGNOSIS — R278 Other lack of coordination: Secondary | ICD-10-CM

## 2020-10-10 DIAGNOSIS — R262 Difficulty in walking, not elsewhere classified: Secondary | ICD-10-CM | POA: Diagnosis present

## 2020-10-10 DIAGNOSIS — R269 Unspecified abnormalities of gait and mobility: Secondary | ICD-10-CM | POA: Diagnosis not present

## 2020-10-10 NOTE — Therapy (Signed)
[place] MAIN [facility] SERVICES [address] [place], [place], [address] Phone: [phone]   Fax:  [phone]  Physical Therapy Treatment  Patient Details  Name: Leslie Smith MRN: [id number] Date of Birth: 07/23/1950 Referring Provider (PT): Dr. Darlin Priestly   Encounter Date: 10/10/2020   PT End of Session - 10/10/20 1124    Visit Number 4    Number of Visits 25    Date for PT Re-Evaluation 12/20/20    Authorization Time Period 09/27/2020-12/20/2020    PT Start Time 1118    PT Stop Time 1200    PT Time Calculation (min) 42 min    Equipment Utilized During Treatment Gait belt    Activity Tolerance Patient tolerated treatment well    Behavior During Therapy [facility] for tasks assessed/performed           Past Medical History:  Diagnosis Date  . 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 Bladder incontinence   . Cancer [facility]) 2014   brain tumor, resected  . Cellulitis and abscess of leg 2019   2 episodes this year  . Complication of anesthesia   . DJD (degenerative joint disease) of knee 2018  . Fall   . Fatigue   . History of blood transfusion   . Hypertension   . MS (multiple sclerosis) ([place])   . Myelitis ([place])   . Neuromuscular disorder ([place]) 2001   multiple sclerosis diagnosed by MRI  . PONV (postoperative nausea and vomiting)    severe  . Thrombocytopenia ([place])     Past Surgical History:  Procedure Laterality Date  . ABDOMINAL HYSTERECTOMY  2011   required wound vac for 6 weeks. ovaries had grown attached to her back bone  . BRAIN SURGERY  2015   tumor resection-denies seizures  . BREAST SURGERY Left 2014   biopsy. negative  . HARDWARE REMOVAL Left 05/05/2018   Procedure: HARDWARE REMOVAL- LEFT ANKLE;  Surgeon: Hessie Knows, MD;  Location: [facility];  Service: Orthopedics;  Laterality: Left;  . I & D EXTREMITY Left 08/25/2015   Procedure: IRRIGATION AND DEBRIDEMENT THUMB;  Surgeon: Iran Planas, MD;  Location: [place];  Service:  Orthopedics;  Laterality: Left;  . ORIF ANKLE FRACTURE Left 02/20/2018   Procedure: OPEN REDUCTION INTERNAL FIXATION (ORIF) ANKLE FRACTURE;  Surgeon: Hessie Knows, MD;  Location: [facility];  Service: Orthopedics;  Laterality: Left;    There were no vitals filed for this visit.   Subjective Assessment - 10/10/20 1123    Subjective Patient reports doing okay today with no new complaints    Pertinent History Patient diagnosed with MS in 2001. Past medical history significant for HTN, MS, DJD right knee, Hyperlipidemia, Thrombocytopenia, S/p ORIF Left ankle fracture.    Limitations Lifting;Standing;Walking;House hold activities    How long can you sit comfortably? No restrictions    How long can you stand comfortably? 30 min maybe    How long can you walk comfortably? maybe 15 min    Patient Stated Goals Walk better and regain some lost strength.    Currently in Pain? No/denies    Pain Onset More than a month ago                  Vitals: 146/58 mmHg; HR=78 bpm   Neuro RE-ed with close CGA and cues for safety awareness and body mechanics in //bars:   Standing with CGA next to support surface:  Airex pad: static stand  Feet apart 20 seconds  with EO x 2 sets Airex pad: Static stand feet apart 20 sec with EC x 2 sets Airex pad: Horizontal head turns x 5 with feet apart and EO x 2 sets Airex pad: horizontal head turns x5 with feet apart and EC x 2 sets  Airex pad: vertical head turns 30 seconds, EO-cueing for arc of motion, noticeable sway with upward gaze increasing demand on ankle righting reaction musculature   Static stand on A/P rockerboard x 1 min  Ant/posterior weight shifting on rockerboard x 25 reps each Static stand on a/p positioned Rocker with head turns Laterally positioned rockerboard - static stand x 1 min Lateral weight shift on rockerboard x 25 reps each positioned  Laterally positioned rockerboard- static stand with head turns  Step up with alt LE x 10 reps each  on blue airex pad  Without UE support - mild unsteadiness yet no loss of balance.  Education provided throughout session via VC/TC and demonstration to facilitate movement at target joints and correct muscle activation for all testing and exercises performed.   Clinical Impression: Patient challenged today with progressive balance exercises on airex pad. She exhibited some difficulty with activities with Eyes closed with marked increased ant/post sway- she did improve overall today with increased practice. Pt will benefit from skilled PT services to address deficits in balance and decrease risk for future falls and improve her quality of life.                    PT Education - 10/11/20 1453    Education Details specific exercise form    Person(s) Educated Patient    Methods Explanation;Demonstration;Tactile cues;Verbal cues    Comprehension Verbalized understanding;Returned demonstration;Verbal cues required;Tactile cues required;Need further instruction            PT Short Term Goals - 09/27/20 1245      PT SHORT TERM GOAL #1   Title Pt will be independent with HEP in order to improve strength and balance in order to decrease fall risk and improve function at home and work.    Baseline 09/27/2020- Patient has no formal HEP in place    Time 6    Period Weeks    Status New    Target Date 11/08/20             PT Long Term Goals - 10/03/20 1607      PT LONG TERM GOAL #1   Title Pt will improve FOTO score from 61  to target score of 64 to display perceived improvements in ability to complete ADL's and functional mobility activities.    Baseline 09/27/2020: FOTO=61    Time 12    Period Weeks    Status New    Target Date 12/20/20      PT LONG TERM GOAL #2   Title Pt will improve DGI by at least 3 points in order to demonstrate clinically significant improvement in balance and decreased risk for falls.    Baseline 09/27/2020-DGI=18/24    Time 12    Period Weeks     Status New    Target Date 12/20/20      PT LONG TERM GOAL #3   Title Pt will improve ABC by at least 10 % in order to demonstrate clinically significant improvement in balance confidence.    Baseline 09/27/2020-68.1%    Time 12    Period Weeks    Status New    Target Date 12/20/20  PT LONG TERM GOAL #4   Title Pt will decrease 5TSTS by at least 5 seconds (initial =23 sec with no UE support) in order to demonstrate clinically significant improvement in LE strength.    Baseline 09/27/2020= 23 sec. with No UE support    Time 12    Period Weeks    Status New    Target Date 12/20/20      PT LONG TERM GOAL #5   Title Pt will decrease TUG to below 14 seconds/decrease in order to demonstrate decreased fall risk.    Baseline 09/27/2020- TUG score= 15.15 sec without UE support    Time 12    Period Weeks    Status New    Target Date 12/20/20      Additional Long Term Goals   Additional Long Term Goals Yes      PT LONG TERM GOAL #6   Title Patient will increase six minute walk test distance to >1000 for progression to community ambulator and improve gait ability    Baseline 4/26: 695 ft with one seated rest break    Time 12    Period Weeks    Status New    Target Date 12/20/20                 Plan - 10/10/20 1125    Clinical Impression Statement Patient challenged today with progressive balance exercises on airex pad. She exhibited some difficulty with activities with Eyes closed with marked increased ant/post sway- she did improve overall today with increased practice. Pt will benefit from skilled PT services to address deficits in balance and decrease risk for future falls and improve her quality of life.    Personal Factors and Comorbidities Comorbidity 3+    Comorbidities HTN, MS, Arthritis, COPD    Examination-Activity Limitations Bend;Carry;Continence;Lift;Squat;Stairs;Stand    Examination-Participation Restrictions Community Activity;Yard Work    Stability/Clinical  Decision Making Stable/Uncomplicated    Rehab Potential Good    PT Frequency 2x / week    PT Duration 12 weeks    PT Treatment/Interventions ADLs/Self Care Home Management;Cryotherapy;Moist Heat;DME Instruction;Gait training;Stair training;Functional mobility training;Therapeutic activities;Therapeutic exercise;Balance training;Neuromuscular re-education;Patient/family education;Manual techniques;Passive range of motion    PT Next Visit Plan Instruct in initial LE stretngthening and balance exercises and add to HEP; Complete 6 min walk test and add goal    PT Home Exercise Plan To be initiated next 1-2 sessions    Consulted and Agree with Plan of Care Patient           Patient will benefit from skilled therapeutic intervention in order to improve the following deficits and impairments:  Abnormal gait,Cardiopulmonary status limiting activity,Decreased activity tolerance,Decreased balance,Decreased coordination,Decreased endurance,Decreased mobility,Decreased range of motion,Decreased strength,Difficulty walking,Impaired flexibility,Obesity,Pain  Visit Diagnosis: Abnormality of gait and mobility  Difficulty in walking, not elsewhere classified  Muscle weakness (generalized)  Other lack of coordination     Problem List Patient Active Problem List   Diagnosis Date Noted  . S/P ORIF (open reduction internal fixation) fracture 02/20/2018  . DJD (degenerative joint disease) of knee 01/02/2017  . Advanced care planning/counseling discussion 01/02/2017  . Thrombocytopenia ([place]) 10/30/2015  . Multiple sclerosis ([place])   . Essential hypertension   . Hyperlipidemia     Lewis Moccasin, PT 10/11/2020, 3:00 PM  [place] MAIN [facility] SERVICES [address] [place], [place], [address] Phone: [phone]   Fax:  [phone]  Name: Leslie Smith MRN: [id number] Date of Birth: 1950-10-14

## 2020-10-12 ENCOUNTER — Other Ambulatory Visit: Payer: Self-pay

## 2020-10-12 ENCOUNTER — Ambulatory Visit: Payer: Medicare Other | Admitting: Physical Therapy

## 2020-10-12 ENCOUNTER — Encounter: Payer: Self-pay | Admitting: Physical Therapy

## 2020-10-12 DIAGNOSIS — R262 Difficulty in walking, not elsewhere classified: Secondary | ICD-10-CM

## 2020-10-12 DIAGNOSIS — R269 Unspecified abnormalities of gait and mobility: Secondary | ICD-10-CM

## 2020-10-12 DIAGNOSIS — M6281 Muscle weakness (generalized): Secondary | ICD-10-CM

## 2020-10-12 DIAGNOSIS — R278 Other lack of coordination: Secondary | ICD-10-CM

## 2020-10-12 NOTE — Therapy (Signed)
[place] MAIN [facility] SERVICES [address] [place], [place], [address] Phone: [phone]   Fax:  [phone]  Physical Therapy Treatment  Patient Details  Name: Leslie Smith MRN: [id number] Date of Birth: 1950/07/14 Referring Provider (PT): Dr. Darlin Priestly   Encounter Date: 10/12/2020   PT End of Session - 10/12/20 1437    Visit Number 5    Number of Visits 25    Date for PT Re-Evaluation 12/20/20    Authorization Time Period 09/27/2020-12/20/2020    PT Start Time 1432    PT Stop Time 1515    PT Time Calculation (min) 43 min    Equipment Utilized During Treatment Gait belt    Activity Tolerance Patient tolerated treatment well    Behavior During Therapy [facility] for tasks assessed/performed           Past Medical History:  Diagnosis Date  . 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 Bladder incontinence   . Cancer [facility]) 2014   brain tumor, resected  . Cellulitis and abscess of leg 2019   2 episodes this year  . Complication of anesthesia   . DJD (degenerative joint disease) of knee 2018  . Fall   . Fatigue   . History of blood transfusion   . Hypertension   . MS (multiple sclerosis) ([place])   . Myelitis ([place])   . Neuromuscular disorder ([place]) 2001   multiple sclerosis diagnosed by MRI  . PONV (postoperative nausea and vomiting)    severe  . Thrombocytopenia ([place])     Past Surgical History:  Procedure Laterality Date  . ABDOMINAL HYSTERECTOMY  2011   required wound vac for 6 weeks. ovaries had grown attached to her back bone  . BRAIN SURGERY  2015   tumor resection-denies seizures  . BREAST SURGERY Left 2014   biopsy. negative  . HARDWARE REMOVAL Left 05/05/2018   Procedure: HARDWARE REMOVAL- LEFT ANKLE;  Surgeon: Hessie Knows, MD;  Location: [facility] ORS;  Service: Orthopedics;  Laterality: Left;  . I & D EXTREMITY Left 08/25/2015   Procedure: IRRIGATION AND DEBRIDEMENT THUMB;  Surgeon: Iran Planas, MD;  Location: [place];  Service:  Orthopedics;  Laterality: Left;  . ORIF ANKLE FRACTURE Left 02/20/2018   Procedure: OPEN REDUCTION INTERNAL FIXATION (ORIF) ANKLE FRACTURE;  Surgeon: Hessie Knows, MD;  Location: [facility] ORS;  Service: Orthopedics;  Laterality: Left;    There were no vitals filed for this visit.   Subjective Assessment - 10/12/20 1436    Subjective Patient reports being tired today. She mowed the yard yesterday and was busy cleaning house this morning. She states, "I may get tired on you today."    Pertinent History Patient diagnosed with MS in 2001. Past medical history significant for HTN, MS, DJD right knee, Hyperlipidemia, Thrombocytopenia, S/p ORIF Left ankle fracture.    Limitations Lifting;Standing;Walking;House hold activities    How long can you sit comfortably? No restrictions    How long can you stand comfortably? 30 min maybe    How long can you walk comfortably? maybe 15 min    Patient Stated Goals Walk better and regain some lost strength.    Currently in Pain? No/denies    Pain Onset More than a month ago                  Vitals: 148/59 mmHg; HR=72 bpm   Neuro RE-ed with close CGA and cues for safety awareness and body mechanics: Standing with CGA next  to support surface:  Airex pad: static stand  Feet apart 20 seconds with EO x 2 sets Airex pad: Static stand feet apart 20 sec with EC x 2 sets Progressed to airex pad: feet closer together eyes open/eyes closed 20 sec hold x1 set each; Patient exhibits increased forward loss of balance with increased demand on ankle righting especially with eyes closed on compliant surface; Standing on airex pad: -bilateral heel raise unsupported 3 sec hold x10 reps with moderate difficulty reported when attempting to hold for 3 sec; CGA required for safety;  Standing on airex pad: -alternate toe taps to 4 inch step x15 reps with 1-0 rail assist, min A for safety;   Resisted walking 12.5# forward/backward x3 laps with CGA for safety and cues  for weight shift for better dynamic balance; Resisted walking 12.5# side stepping x3 laps each direction with min A for balance and weight shift;   Seated red tband hamstring curl x10 reps each LE;   Patient challenged today with progressive balance exercises on compliant surface, challenging ankle strategies and weight shift. Patient had most difficulty with narrow stance and eyes closed. Patient instructed in unsupported heel raises to challenge ankle strategies with moderate difficulty reported. Patient continues to have shortness of breath with prolonged standing activity, requiring short rest breaks. Vitals monitored, see below.  Vitals assessed, Spo2 96%, HR 87                      PT Education - 10/12/20 1437    Education Details balance/positioning; HEP reinforced;    Person(s) Educated Patient    Methods Explanation;Verbal cues    Comprehension Verbalized understanding;Returned demonstration;Verbal cues required;Need further instruction            PT Short Term Goals - 09/27/20 1245      PT SHORT TERM GOAL #1   Title Pt will be independent with HEP in order to improve strength and balance in order to decrease fall risk and improve function at home and work.    Baseline 09/27/2020- Patient has no formal HEP in place    Time 6    Period Weeks    Status New    Target Date 11/08/20             PT Long Term Goals - 10/03/20 1607      PT LONG TERM GOAL #1   Title Pt will improve FOTO score from 61  to target score of 64 to display perceived improvements in ability to complete ADL's and functional mobility activities.    Baseline 09/27/2020: FOTO=61    Time 12    Period Weeks    Status New    Target Date 12/20/20      PT LONG TERM GOAL #2   Title Pt will improve DGI by at least 3 points in order to demonstrate clinically significant improvement in balance and decreased risk for falls.    Baseline 09/27/2020-DGI=18/24    Time 12    Period Weeks     Status New    Target Date 12/20/20      PT LONG TERM GOAL #3   Title Pt will improve ABC by at least 10 % in order to demonstrate clinically significant improvement in balance confidence.    Baseline 09/27/2020-68.1%    Time 12    Period Weeks    Status New    Target Date 12/20/20      PT LONG TERM GOAL #4  Title Pt will decrease 5TSTS by at least 5 seconds (initial =23 sec with no UE support) in order to demonstrate clinically significant improvement in LE strength.    Baseline 09/27/2020= 23 sec. with No UE support    Time 12    Period Weeks    Status New    Target Date 12/20/20      PT LONG TERM GOAL #5   Title Pt will decrease TUG to below 14 seconds/decrease in order to demonstrate decreased fall risk.    Baseline 09/27/2020- TUG score= 15.15 sec without UE support    Time 12    Period Weeks    Status New    Target Date 12/20/20      Additional Long Term Goals   Additional Long Term Goals Yes      PT LONG TERM GOAL #6   Title Patient will increase six minute walk test distance to >1000 for progression to community ambulator and improve gait ability    Baseline 4/26: 695 ft with one seated rest break    Time 12    Period Weeks    Status New    Target Date 12/20/20                 Plan - 10/12/20 1518    Clinical Impression Statement Patient motivated and participated well within session. She was instructed in advanced balance exercise with standing on compliant surfaces. Patient continues to have decreased ankle stability with increased loss of balance anteriorly especially with eyes closed. She also exhibits increased shortness of breath with prolonged standing/walking. Vitals monitored and were WNL throughout session. She would benefit from additional skilled PT Intervention to improve strength, balance and mobility;    Personal Factors and Comorbidities Comorbidity 3+    Comorbidities HTN, MS, Arthritis, COPD    Examination-Activity Limitations  Bend;Carry;Continence;Lift;Squat;Stairs;Stand    Examination-Participation Restrictions Community Activity;Yard Work    Stability/Clinical Decision Making Stable/Uncomplicated    Rehab Potential Good    PT Frequency 2x / week    PT Duration 12 weeks    PT Treatment/Interventions ADLs/Self Care Home Management;Cryotherapy;Moist Heat;DME Instruction;Gait training;Stair training;Functional mobility training;Therapeutic activities;Therapeutic exercise;Balance training;Neuromuscular re-education;Patient/family education;Manual techniques;Passive range of motion    PT Next Visit Plan Instruct in initial LE stretngthening and balance exercises and add to HEP; Complete 6 min walk test and add goal    PT Home Exercise Plan To be initiated next 1-2 sessions    Consulted and Agree with Plan of Care Patient           Patient will benefit from skilled therapeutic intervention in order to improve the following deficits and impairments:  Abnormal gait,Cardiopulmonary status limiting activity,Decreased activity tolerance,Decreased balance,Decreased coordination,Decreased endurance,Decreased mobility,Decreased range of motion,Decreased strength,Difficulty walking,Impaired flexibility,Obesity,Pain  Visit Diagnosis: Abnormality of gait and mobility  Difficulty in walking, not elsewhere classified  Muscle weakness (generalized)  Other lack of coordination     Problem List Patient Active Problem List   Diagnosis Date Noted  . S/P ORIF (open reduction internal fixation) fracture 02/20/2018  . DJD (degenerative joint disease) of knee 01/02/2017  . Advanced care planning/counseling discussion 01/02/2017  . Thrombocytopenia ([place]) 10/30/2015  . Multiple sclerosis ([place])   . Essential hypertension   . Hyperlipidemia     Rayni Nemitz PT, DPT 10/12/2020, 3:20 PM  [place] MAIN [facility] SERVICES [address] [place], [place], [address] Phone: [phone]   Fax:   [phone]  Name: MALAYIA SPIZZIRRI MRN: [id number] Date  of Birth: Jul 02, 1950

## 2020-10-17 ENCOUNTER — Ambulatory Visit: Payer: Medicare Other | Admitting: Physical Therapy

## 2020-10-17 ENCOUNTER — Encounter: Payer: Self-pay | Admitting: Physical Therapy

## 2020-10-17 ENCOUNTER — Other Ambulatory Visit: Payer: Self-pay

## 2020-10-17 DIAGNOSIS — R278 Other lack of coordination: Secondary | ICD-10-CM

## 2020-10-17 DIAGNOSIS — M6281 Muscle weakness (generalized): Secondary | ICD-10-CM

## 2020-10-17 DIAGNOSIS — R269 Unspecified abnormalities of gait and mobility: Secondary | ICD-10-CM

## 2020-10-17 DIAGNOSIS — R262 Difficulty in walking, not elsewhere classified: Secondary | ICD-10-CM

## 2020-10-17 NOTE — Therapy (Signed)
[place] MAIN [facility] SERVICES [address] [place], [place], [address] Phone: [phone]   Fax:  [phone]  Physical Therapy Treatment  Patient Details  Name: Leslie Smith MRN: [id number] Date of Birth: [birth year] Referring Provider (PT): Dr. Darlin Priestly   Encounter Date: 10/17/2020   PT End of Session - 10/17/20 1130    Visit Number 6    Number of Visits 25    Date for PT Re-Evaluation 12/20/20    Authorization Time Period 09/27/2020-12/20/2020    PT Start Time 1128    PT Stop Time 1206    PT Time Calculation (min) 38 min    Equipment Utilized During Treatment Gait belt    Activity Tolerance Patient tolerated treatment well    Behavior During Therapy [facility] for tasks assessed/performed           Past Medical History:  Diagnosis Date  . 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 Bladder incontinence   . Cancer [facility]) 2014   brain tumor, resected  . Cellulitis and abscess of leg 2019   2 episodes this year  . Complication of anesthesia   . DJD (degenerative joint disease) of knee 2018  . Fall   . Fatigue   . History of blood transfusion   . Hypertension   . MS (multiple sclerosis) ([place])   . Myelitis ([place])   . Neuromuscular disorder ([place]) 2001   multiple sclerosis diagnosed by MRI  . PONV (postoperative nausea and vomiting)    severe  . Thrombocytopenia ([place])     Past Surgical History:  Procedure Laterality Date  . ABDOMINAL HYSTERECTOMY  2011   required wound vac for 6 weeks. ovaries had grown attached to her back bone  . BRAIN SURGERY  2015   tumor resection-denies seizures  . BREAST SURGERY Left 2014   biopsy. negative  . HARDWARE REMOVAL Left 05/05/2018   Procedure: HARDWARE REMOVAL- LEFT ANKLE;  Surgeon: Hessie Knows, MD;  Location: [facility] ORS;  Service: Orthopedics;  Laterality: Left;  . I & D EXTREMITY Left 08/25/2015   Procedure: IRRIGATION AND DEBRIDEMENT THUMB;  Surgeon: Iran Planas, MD;  Location: [place];  Service:  Orthopedics;  Laterality: Left;  . ORIF ANKLE FRACTURE Left 02/20/2018   Procedure: OPEN REDUCTION INTERNAL FIXATION (ORIF) ANKLE FRACTURE;  Surgeon: Hessie Knows, MD;  Location: [facility] ORS;  Service: Orthopedics;  Laterality: Left;    There were no vitals filed for this visit.   Subjective Assessment - 10/17/20 1129    Subjective Patient reports being busy. She reports, "I am sorry I am late." She reports having a good time at the concert.    Pertinent History Patient diagnosed with MS in 2001. Past medical history significant for HTN, MS, DJD right knee, Hyperlipidemia, Thrombocytopenia, S/p ORIF Left ankle fracture.    Limitations Lifting;Standing;Walking;House hold activities    How long can you sit comfortably? No restrictions    How long can you stand comfortably? 30 min maybe    How long can you walk comfortably? maybe 15 min    Patient Stated Goals Walk better and regain some lost strength.    Currently in Pain? No/denies    Pain Onset More than a month ago    Multiple Pain Sites No                   Vitals: HR 79, SPO2 97%  Warm up walking around gym x300 feet, fast/slow gait speed for challenge to dynamic balance  with speed changes and to allow for breath recovery with increased cardiovascular demand of walking; Required close supervision for safety;  x2 sets;     Neuro RE-ed with close CGA and cues for safety awareness and body mechanics: Standing with CGA next to support surface:  Airex pad: static stand  Feet apart 20 seconds with EO x 2 sets Airex pad: Static stand feet apart 20 sec with EC x 2 sets Progressed to airex pad: feet closer together eyes open/eyes closed 20 sec hold x1 set each; Patient exhibits increased forward loss of balance with increased demand on ankle righting especially with eyes closed on compliant surface; Standing on airex pad: -bilateral heel raise unsupported 3 sec hold x10 reps with moderate difficulty reported when attempting to hold for  3 sec; CGA required for safety;   Standing on airex pad: -alternate toe taps to 4 inch step x15 reps with 1-0 rail assist, min A for safety;   -Standing one foot on airex, one foot on 4inch step:  Unsupported standing with head turns side/side, x 5 reps each  Cone pass side/side x5 reps each foot on step;     Stepping over orange hurdle x10 reps forward/backward, side/side Required 1-0 rail assist and CGA for safety with min VCs to increase step length for better foot clearance;   Vitals assessed, HR 101, Spo2 98%  Weaving around cones #5 x2 sets Side stepping over cones #5 x1 set each direction with min A for safety; Patient had a harder time clearing right foot due to instability and weakness of right knee. She reports her right knee has been giving out on her today and she doesn't feel confident with it.     Patient challenged today with progressive balance exercises on compliant surface, challenging ankle strategies and weight shift.  Patient instructed in unsupported heel raises to challenge ankle strategies with moderate difficulty reported. Instructed patient in obstacle negotiation to challenge dynamic balance. Patient continues to have shortness of breath with prolonged standing activity, requiring short rest breaks. Vitals monitored, see below.                           PT Education - 10/17/20 1130    Education Details balance/gait safety, HEP    Person(s) Educated Patient    Methods Explanation;Verbal cues    Comprehension Verbalized understanding;Returned demonstration;Verbal cues required;Need further instruction            PT Short Term Goals - 09/27/20 1245      PT SHORT TERM GOAL #1   Title Pt will be independent with HEP in order to improve strength and balance in order to decrease fall risk and improve function at home and work.    Baseline 09/27/2020- Patient has no formal HEP in place    Time 6    Period Weeks    Status New    Target Date  11/08/20             PT Long Term Goals - 10/03/20 1607      PT LONG TERM GOAL #1   Title Pt will improve FOTO score from 61  to target score of 64 to display perceived improvements in ability to complete ADL's and functional mobility activities.    Baseline 09/27/2020: FOTO=61    Time 12    Period Weeks    Status New    Target Date 12/20/20      PT LONG TERM  GOAL #2   Title Pt will improve DGI by at least 3 points in order to demonstrate clinically significant improvement in balance and decreased risk for falls.    Baseline 09/27/2020-DGI=18/24    Time 12    Period Weeks    Status New    Target Date 12/20/20      PT LONG TERM GOAL #3   Title Pt will improve ABC by at least 10 % in order to demonstrate clinically significant improvement in balance confidence.    Baseline 09/27/2020-68.1%    Time 12    Period Weeks    Status New    Target Date 12/20/20      PT LONG TERM GOAL #4   Title Pt will decrease 5TSTS by at least 5 seconds (initial =23 sec with no UE support) in order to demonstrate clinically significant improvement in LE strength.    Baseline 09/27/2020= 23 sec. with No UE support    Time 12    Period Weeks    Status New    Target Date 12/20/20      PT LONG TERM GOAL #5   Title Pt will decrease TUG to below 14 seconds/decrease in order to demonstrate decreased fall risk.    Baseline 09/27/2020- TUG score= 15.15 sec without UE support    Time 12    Period Weeks    Status New    Target Date 12/20/20      Additional Long Term Goals   Additional Long Term Goals Yes      PT LONG TERM GOAL #6   Title Patient will increase six minute walk test distance to >1000 for progression to community ambulator and improve gait ability    Baseline 4/26: 695 ft with one seated rest break    Time 12    Period Weeks    Status New    Target Date 12/20/20                 Plan - 10/17/20 1208    Clinical Impression Statement Patient late to session. Instructed patient  in advanced  balance activities. Progressed balance with instruction in static and dynamic balance tasks. Patient reports increased right knee instability this session which was evident when negotiating obstacles. She does require CGA to min A when negotiating obstacles especially without HHA/rail assist. Patient required min VCs for proper exercise technique/weight shift for better dynamic balance control. She would benefit from additional skilled PT intervention to improve strength, balance and gait safety; Patient continues to have shortness of breath with prolonged standing but SPo2/HR was WNL.    Personal Factors and Comorbidities Comorbidity 3+    Comorbidities HTN, MS, Arthritis, COPD    Examination-Activity Limitations Bend;Carry;Continence;Lift;Squat;Stairs;Stand    Examination-Participation Restrictions Community Activity;Yard Work    Stability/Clinical Decision Making Stable/Uncomplicated    Rehab Potential Good    PT Frequency 2x / week    PT Duration 12 weeks    PT Treatment/Interventions ADLs/Self Care Home Management;Cryotherapy;Moist Heat;DME Instruction;Gait training;Stair training;Functional mobility training;Therapeutic activities;Therapeutic exercise;Balance training;Neuromuscular re-education;Patient/family education;Manual techniques;Passive range of motion    PT Next Visit Plan Instruct in initial LE stretngthening and balance exercises and add to HEP; Complete 6 min walk test and add goal    PT Home Exercise Plan To be initiated next 1-2 sessions    Consulted and Agree with Plan of Care Patient           Patient will benefit from skilled therapeutic intervention in order to improve the  following deficits and impairments:  Abnormal gait,Cardiopulmonary status limiting activity,Decreased activity tolerance,Decreased balance,Decreased coordination,Decreased endurance,Decreased mobility,Decreased range of motion,Decreased strength,Difficulty walking,Impaired  flexibility,Obesity,Pain  Visit Diagnosis: Abnormality of gait and mobility  Difficulty in walking, not elsewhere classified  Muscle weakness (generalized)  Other lack of coordination     Problem List Patient Active Problem List   Diagnosis Date Noted  . S/P ORIF (open reduction internal fixation) fracture 02/20/2018  . DJD (degenerative joint disease) of knee 01/02/2017  . Advanced care planning/counseling discussion 01/02/2017  . Thrombocytopenia ([place]) 10/30/2015  . Multiple sclerosis ([place])   . Essential hypertension   . Hyperlipidemia     Naeema Patlan PT, DPT 10/17/2020, 12:11 PM  [place] MAIN [facility] SERVICES [address] [place], [place], [address] Phone: [phone]   Fax:  [phone]  Name: Leslie Smith MRN: [id number] Date of Birth: 1950/08/05

## 2020-10-19 ENCOUNTER — Encounter: Payer: Self-pay | Admitting: Physical Therapy

## 2020-10-19 ENCOUNTER — Other Ambulatory Visit: Payer: Self-pay

## 2020-10-19 ENCOUNTER — Ambulatory Visit: Payer: Medicare Other | Admitting: Physical Therapy

## 2020-10-19 DIAGNOSIS — R269 Unspecified abnormalities of gait and mobility: Secondary | ICD-10-CM

## 2020-10-19 DIAGNOSIS — M6281 Muscle weakness (generalized): Secondary | ICD-10-CM

## 2020-10-19 DIAGNOSIS — R278 Other lack of coordination: Secondary | ICD-10-CM

## 2020-10-19 DIAGNOSIS — R262 Difficulty in walking, not elsewhere classified: Secondary | ICD-10-CM

## 2020-10-19 NOTE — Therapy (Signed)
[place] MAIN [facility] SERVICES [address] [place], [place], [address] Phone: [phone]   Fax:  [phone]  Physical Therapy Treatment  Patient Details  Name: Leslie Smith MRN: [id number] Date of Birth: June 06, 1951 Referring Provider (PT): Dr. Darlin Priestly   Encounter Date: 10/19/2020   PT End of Session - 10/19/20 1441    Visit Number 7    Number of Visits 25    Date for PT Re-Evaluation 12/20/20    Authorization Time Period 09/27/2020-12/20/2020    PT Start Time 1435    PT Stop Time 1515    PT Time Calculation (min) 40 min    Equipment Utilized During Treatment Gait belt    Activity Tolerance Patient tolerated treatment well    Behavior During Therapy [facility] for tasks assessed/performed           Past Medical History:  Diagnosis Date  . 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[place]) 09/15/2014  . Bladder incontinence   . Cancer [facility]) 2014   brain tumor, resected  . Cellulitis and abscess of leg 2019   2 episodes this year  . Complication of anesthesia   . DJD (degenerative joint disease) of knee 2018  . Fall   . Fatigue   . History of blood transfusion   . Hypertension   . MS (multiple sclerosis) ([place])   . Myelitis ([place])   . Neuromuscular disorder ([place]) 2001   multiple sclerosis diagnosed by MRI  . PONV (postoperative nausea and vomiting)    severe  . Thrombocytopenia ([place])     Past Surgical History:  Procedure Laterality Date  . ABDOMINAL HYSTERECTOMY  2011   required wound vac for 6 weeks. ovaries had grown attached to her back bone  . BRAIN SURGERY  2015   tumor resection-denies seizures  . BREAST SURGERY Left 2014   biopsy. negative  . HARDWARE REMOVAL Left 05/05/2018   Procedure: HARDWARE REMOVAL- LEFT ANKLE;  Surgeon: Hessie Knows, MD;  Location: [facility] OR[facility];  Service: Orthopedics;  Laterality: Left;  . I & D EXTREMITY Left 08/25/2015   Procedure: IRRIGATION AND DEBRIDEMENT THUMB;  Surgeon: Iran Planas, MD;  Location: [place];  Service:  Orthopedics;  Laterality: Left;  . ORIF ANKLE FRACTURE Left 02/20/2018   Procedure: OPEN REDUCTION INTERNAL FIXATION (ORIF) ANKLE FRACTURE;  Surgeon: Hessie Knows, MD;  Location: [facility] ORS;  Service: Orthopedics;  Laterality: Left;    There were no vitals filed for this visit.   Subjective Assessment - 10/19/20 1439    Subjective Patient reports being busy and has been doing a lot. She reports increased achiness all over and was really tired yesterday.    Pertinent History Patient diagnosed with MS in 2001. Past medical history significant for HTN, MS, DJD right knee, Hyperlipidemia, Thrombocytopenia, S/p ORIF Left ankle fracture.    Limitations Lifting;Standing;Walking;House hold activities    How long can you sit comfortably? No restrictions    How long can you stand comfortably? 30 min maybe    How long can you walk comfortably? maybe 15 min    Patient Stated Goals Walk better and regain some lost strength.    Currently in Pain? Yes    Pain Score 7     Pain Location Other (Comment)   all over   Pain Descriptors / Indicators Aching    Pain Type Chronic pain    Pain Onset More than a month ago    Pain Frequency Intermittent    Aggravating Factors  increased activity  Pain Relieving Factors rest/pain meds    Effect of Pain on Daily Activities decreased activity tolerance;    Multiple Pain Sites No                 Warm up walking around gym x300 feet, with cues for erect posture and to increase step length as tolerated;  Required close supervision for safety;  x2 sets;   Vitals: HR 114, SPo2 96%    Neuro RE-ed with close CGA and cues for safety awareness and body mechanics: Standing with CGA next to support surface:  Standing on airex pad: -bilateral heel raise unsupported 3 sec hold x10 reps with moderate difficulty reported when attempting to hold for 3 sec; CGA required for safety;   Standing on airex pad: -alternate toe taps to 8 inch step x10 reps with 1-0 rail  assist, min A for safety;   -Standing one foot on airex, one foot on 8inch step:             Unsupported standing, BUE wand flexion x5 reps, progressed to lateral rotation side/side x5 reps each foot on step;  Forward step ups leading with LLE x10 reps with B rail assist;   Ladder drills: -forward walking x2 laps with one foot per square -coordination out-out-in-in walking x2 laps with CGA for safety Required cues for foot clearance and weight shift for better dynamic balance.    Vitals assessed, HR 102, Spo2 98%      Patient reports increased fatigue and soreness today with recent MS flareup. Increased rest breaks required. Patient instructed in advanced balance tasks utilizing compliant surface to challenge stance control. She does require CGA to min A with most balance tasks for safety. She reports increased RLE knee discomfort with intermittent knee popping which limits stance control. She would benefit from additional skilled PT Intervention to improve strength, balance and mobility;                      PT Education - 10/19/20 1441    Education Details balance/gait safety; HEP    Person(s) Educated Patient    Methods Explanation;Verbal cues    Comprehension Verbalized understanding;Returned demonstration;Verbal cues required;Need further instruction            PT Short Term Goals - 09/27/20 1245      PT SHORT TERM GOAL #1   Title Pt will be independent with HEP in order to improve strength and balance in order to decrease fall risk and improve function at home and work.    Baseline 09/27/2020- Patient has no formal HEP in place    Time 6    Period Weeks    Status New    Target Date 11/08/20             PT Long Term Goals - 10/03/20 1607      PT LONG TERM GOAL #1   Title Pt will improve FOTO score from 61  to target score of 64 to display perceived improvements in ability to complete ADL's and functional mobility activities.    Baseline 09/27/2020:  FOTO=61    Time 12    Period Weeks    Status New    Target Date 12/20/20      PT LONG TERM GOAL #2   Title Pt will improve DGI by at least 3 points in order to demonstrate clinically significant improvement in balance and decreased risk for falls.    Baseline 09/27/2020-DGI=18/24  Time 12    Period Weeks    Status New    Target Date 12/20/20      PT LONG TERM GOAL #3   Title Pt will improve ABC by at least 10 % in order to demonstrate clinically significant improvement in balance confidence.    Baseline 09/27/2020-68.1%    Time 12    Period Weeks    Status New    Target Date 12/20/20      PT LONG TERM GOAL #4   Title Pt will decrease 5TSTS by at least 5 seconds (initial =23 sec with no UE support) in order to demonstrate clinically significant improvement in LE strength.    Baseline 09/27/2020= 23 sec. with No UE support    Time 12    Period Weeks    Status New    Target Date 12/20/20      PT LONG TERM GOAL #5   Title Pt will decrease TUG to below 14 seconds/decrease in order to demonstrate decreased fall risk.    Baseline 09/27/2020- TUG score= 15.15 sec without UE support    Time 12    Period Weeks    Status New    Target Date 12/20/20      Additional Long Term Goals   Additional Long Term Goals Yes      PT LONG TERM GOAL #6   Title Patient will increase six minute walk test distance to >1000 for progression to community ambulator and improve gait ability    Baseline 4/26: 695 ft with one seated rest break    Time 12    Period Weeks    Status New    Target Date 12/20/20                 Plan - 10/19/20 1522    Clinical Impression Statement Patient reports increased fatigue and soreness today with recent MS flareup. Increased rest breaks required. Patient instructed in advanced balance tasks utilizing compliant surface to challenge stance control. She does require CGA to min A with most balance tasks for safety. She reports increased RLE knee discomfort with  intermittent knee popping which limits stance control. She would benefit from additional skilled PT Intervention to improve strength, balance and mobility;    Personal Factors and Comorbidities Comorbidity 3+    Comorbidities HTN, MS, Arthritis, COPD    Examination-Activity Limitations Bend;Carry;Continence;Lift;Squat;Stairs;Stand    Examination-Participation Restrictions Community Activity;Yard Work    Stability/Clinical Decision Making Stable/Uncomplicated    Rehab Potential Good    PT Frequency 2x / week    PT Duration 12 weeks    PT Treatment/Interventions ADLs/Self Care Home Management;Cryotherapy;Moist Heat;DME Instruction;Gait training;Stair training;Functional mobility training;Therapeutic activities;Therapeutic exercise;Balance training;Neuromuscular re-education;Patient/family education;Manual techniques;Passive range of motion    PT Next Visit Plan Instruct in initial LE stretngthening and balance exercises and add to HEP; Complete 6 min walk test and add goal    PT Home Exercise Plan To be initiated next 1-2 sessions    Consulted and Agree with Plan of Care Patient           Patient will benefit from skilled therapeutic intervention in order to improve the following deficits and impairments:  Abnormal gait,Cardiopulmonary status limiting activity,Decreased activity tolerance,Decreased balance,Decreased coordination,Decreased endurance,Decreased mobility,Decreased range of motion,Decreased strength,Difficulty walking,Impaired flexibility,Obesity,Pain  Visit Diagnosis: Abnormality of gait and mobility  Difficulty in walking, not elsewhere classified  Muscle weakness (generalized)  Other lack of coordination     Problem List Patient Active Problem List   Diagnosis  Date Noted  . S/P ORIF (open reduction internal fixation) fracture 02/20/2018  . DJD (degenerative joint disease) of knee 01/02/2017  . Advanced care planning/counseling discussion 01/02/2017  .  Thrombocytopenia ([place]) 10/30/2015  . Multiple sclerosis ([place])   . Essential hypertension   . Hyperlipidemia     Thresa Dozier PT, DPT 10/19/2020, 3:22 PM  [place] MAIN [facility] SERVICES [address] [place], [place], [address] Phone: [phone]   Fax:  [phone]  Name: Leslie Smith MRN: [id number] Date of Birth: 1951/01/14

## 2020-10-24 ENCOUNTER — Other Ambulatory Visit: Payer: Self-pay

## 2020-10-24 ENCOUNTER — Ambulatory Visit: Payer: Medicare Other | Admitting: Physical Therapy

## 2020-10-24 ENCOUNTER — Encounter: Payer: Self-pay | Admitting: Physical Therapy

## 2020-10-24 DIAGNOSIS — R278 Other lack of coordination: Secondary | ICD-10-CM

## 2020-10-24 DIAGNOSIS — M6281 Muscle weakness (generalized): Secondary | ICD-10-CM

## 2020-10-24 DIAGNOSIS — R262 Difficulty in walking, not elsewhere classified: Secondary | ICD-10-CM

## 2020-10-24 DIAGNOSIS — R269 Unspecified abnormalities of gait and mobility: Secondary | ICD-10-CM

## 2020-10-24 NOTE — Therapy (Signed)
[place] MAIN [facility] SERVICES [address] [place], [place], [address] Phone: [phone]   Fax:  [phone]  Physical Therapy Treatment  Patient Details  Name: Leslie Smith MRN: [id number] Date of Birth: [birth year] Referring Provider (PT): Dr. Darlin Priestly   Encounter Date: 10/24/2020   PT End of Session - 10/24/20 1125    Visit Number 8    Number of Visits 25    Date for PT Re-Evaluation 12/20/20    Authorization Time Period 09/27/2020-12/20/2020    PT Start Time 1119    PT Stop Time 1200    PT Time Calculation (min) 41 min    Equipment Utilized During Treatment Gait belt    Activity Tolerance Patient tolerated treatment well    Behavior During Therapy [facility] for tasks assessed/performed           Past Medical History:  Diagnosis Date  . 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 Bladder incontinence   . Cancer [facility]) 2014   brain tumor, resected  . Cellulitis and abscess of leg 2019   2 episodes this year  . Complication of anesthesia   . DJD (degenerative joint disease) of knee 2018  . Fall   . Fatigue   . History of blood transfusion   . Hypertension   . MS (multiple sclerosis) ([place])   . Myelitis ([place])   . Neuromuscular disorder ([place]) 2001   multiple sclerosis diagnosed by MRI  . PONV (postoperative nausea and vomiting)    severe  . Thrombocytopenia ([place])     Past Surgical History:  Procedure Laterality Date  . ABDOMINAL HYSTERECTOMY  2011   required wound vac for 6 weeks. ovaries had grown attached to her back bone  . BRAIN SURGERY  2015   tumor resection-denies seizures  . BREAST SURGERY Left 2014   biopsy. negative  . HARDWARE REMOVAL Left 05/05/2018   Procedure: HARDWARE REMOVAL- LEFT ANKLE;  Surgeon: Hessie Knows, MD;  Location: [facility] ORS;  Service: Orthopedics;  Laterality: Left;  . I & D EXTREMITY Left 08/25/2015   Procedure: IRRIGATION AND DEBRIDEMENT THUMB;  Surgeon: Iran Planas, MD;  Location: [place];  Service:  Orthopedics;  Laterality: Left;  . ORIF ANKLE FRACTURE Left 02/20/2018   Procedure: OPEN REDUCTION INTERNAL FIXATION (ORIF) ANKLE FRACTURE;  Surgeon: Hessie Knows, MD;  Location: [facility] OR[facility];  Service: Orthopedics;  Laterality: Left;    There were no vitals filed for this visit.   Subjective Assessment - 10/24/20 1122    Subjective Patient reports being busy having a funeral over the weekend. She reports the gentleman was well liked and it was a long funeral; She reports that she is still recovering from the weekend.    Pertinent History Patient diagnosed with MS in 2001. Past medical history significant for HTN, MS, DJD right knee, Hyperlipidemia, Thrombocytopenia, S/p ORIF Left ankle fracture.    Limitations Lifting;Standing;Walking;House hold activities    How long can you sit comfortably? No restrictions    How long can you stand comfortably? 30 min maybe    How long can you walk comfortably? maybe 15 min    Patient Stated Goals Walk better and regain some lost strength.    Currently in Pain? Yes    Pain Score 5     Pain Location Knee    Pain Orientation Right    Pain Descriptors / Indicators Aching    Pain Type Chronic pain    Pain Onset More than a month ago  Pain Frequency Intermittent    Aggravating Factors  increased activity    Pain Relieving Factors rest/pain meds    Effect of Pain on Daily Activities decreased activity tolerance;    Multiple Pain Sites No               Warm up walking around gym x3 laps (approximately 140 feet), fast/slow gait speed for challenge to dynamic balance with speed changes and to allow for breath recovery with increased cardiovascular demand of walking; Required close supervision for safety;  x1 sets;   Vitals assessed, HR 111, Spo2 98%  Neuro RE-ed with close CGA and cues for safety awareness and body mechanics: Standing with CGA next to support surface:  Airex pad: static standFeet together 20seconds with EOx 2 sets Airex pad:  Static stand feet together 20 sec with ECx 2 sets Patient exhibits increased forward loss of balance with increased demand on ankle righting especially with eyes closed on compliant surface; Standing on airex pad: -feet together:  -head turns side/side x5 reps  -head turns up/down x5 reps; -tandem stance:  Eyes open/closed 10 sec hold x2 reps each foot in front;   Weaving around cones forward/backward #5 x2 sets Pt had difficulty stepping backward requiring cues for increased step length for better reciprocal gait pattern;   Patient challenged today with progressive balance exercises oncompliant surface, challenging ankle strategies and weight shift.  Patient instructed in unsupported heel raises to challenge ankle strategies with moderate difficulty reported. Instructed patient in obstacle negotiation to challenge dynamic balance. Patient continues to have shortness of breath with prolonged standing activity, requiring short rest breaks. Vitals monitored, see below.                       PT Education - 10/24/20 1124    Education Details balance/gait safety, HEP    Person(s) Educated Patient    Methods Explanation;Verbal cues    Comprehension Verbalized understanding;Returned demonstration;Verbal cues required;Need further instruction            PT Short Term Goals - 09/27/20 1245      PT SHORT TERM GOAL #1   Title Pt will be independent with HEP in order to improve strength and balance in order to decrease fall risk and improve function at home and work.    Baseline 09/27/2020- Patient has no formal HEP in place    Time 6    Period Weeks    Status New    Target Date 11/08/20             PT Long Term Goals - 10/03/20 1607      PT LONG TERM GOAL #1   Title Pt will improve FOTO score from 61  to target score of 64 to display perceived improvements in ability to complete ADL's and functional mobility activities.    Baseline 09/27/2020: FOTO=61     Time 12    Period Weeks    Status New    Target Date 12/20/20      PT LONG TERM GOAL #2   Title Pt will improve DGI by at least 3 points in order to demonstrate clinically significant improvement in balance and decreased risk for falls.    Baseline 09/27/2020-DGI=18/24    Time 12    Period Weeks    Status New    Target Date 12/20/20      PT LONG TERM GOAL #3   Title Pt will improve ABC by at least 10 %  in order to demonstrate clinically significant improvement in balance confidence.    Baseline 09/27/2020-68.1%    Time 12    Period Weeks    Status New    Target Date 12/20/20      PT LONG TERM GOAL #4   Title Pt will decrease 5TSTS by at least 5 seconds (initial =23 sec with no UE support) in order to demonstrate clinically significant improvement in LE strength.    Baseline 09/27/2020= 23 sec. with No UE support    Time 12    Period Weeks    Status New    Target Date 12/20/20      PT LONG TERM GOAL #5   Title Pt will decrease TUG to below 14 seconds/decrease in order to demonstrate decreased fall risk.    Baseline 09/27/2020- TUG score= 15.15 sec without UE support    Time 12    Period Weeks    Status New    Target Date 12/20/20      Additional Long Term Goals   Additional Long Term Goals Yes      PT LONG TERM GOAL #6   Title Patient will increase six minute walk test distance to >1000 for progression to community ambulator and improve gait ability    Baseline 4/26: 695 ft with one seated rest break    Time 12    Period Weeks    Status New    Target Date 12/20/20                 Plan - 10/24/20 1204    Clinical Impression Statement Patient reports increased fatigue as a result of  busy weekend. However despite fatigue, she is pleasant and willing to challenge self. Instructed patient in walking with gait speed changes to challenge dynamic balance while allowing cardiovascular recovery. Patient does require close supervision for safety especially when walking  fast. She continues to be challenged with standing on compliant surfaces requiring intermittent HHA and min A. She reports slight increase in discomfort in right knee with increased weight shift to right side. Patient also exhibits increased unsteadiness with backward walking compared to forward walking. She would benefit from additional skilled PT Intervention to improve strength, balance and gait safety; Plan to address goals next session;    Personal Factors and Comorbidities Comorbidity 3+    Comorbidities HTN, MS, Arthritis, COPD    Examination-Activity Limitations Bend;Carry;Continence;Lift;Squat;Stairs;Stand    Examination-Participation Restrictions Community Activity;Yard Work    Stability/Clinical Decision Making Stable/Uncomplicated    Rehab Potential Good    PT Frequency 2x / week    PT Duration 12 weeks    PT Treatment/Interventions ADLs/Self Care Home Management;Cryotherapy;Moist Heat;DME Instruction;Gait training;Stair training;Functional mobility training;Therapeutic activities;Therapeutic exercise;Balance training;Neuromuscular re-education;Patient/family education;Manual techniques;Passive range of motion    PT Next Visit Plan Instruct in initial LE stretngthening and balance exercises and add to HEP; Complete 6 min walk test and add goal    PT Home Exercise Plan To be initiated next 1-2 sessions    Consulted and Agree with Plan of Care Patient           Patient will benefit from skilled therapeutic intervention in order to improve the following deficits and impairments:  Abnormal gait,Cardiopulmonary status limiting activity,Decreased activity tolerance,Decreased balance,Decreased coordination,Decreased endurance,Decreased mobility,Decreased range of motion,Decreased strength,Difficulty walking,Impaired flexibility,Obesity,Pain  Visit Diagnosis: Abnormality of gait and mobility  Difficulty in walking, not elsewhere classified  Muscle weakness (generalized)  Other lack of  coordination     Problem List Patient  Active Problem List   Diagnosis Date Noted  . S/P ORIF (open reduction internal fixation) fracture 02/20/2018  . DJD (degenerative joint disease) of knee 01/02/2017  . Advanced care planning/counseling discussion 01/02/2017  . Thrombocytopenia ([place]) 10/30/2015  . Multiple sclerosis ([place])   . Essential hypertension   . Hyperlipidemia     Dell Hurtubise PT, DPT 10/24/2020, 12:17 PM  [place] MAIN [facility] SERVICES [address] [place], [place], [address] Phone: [phone]   Fax:  [phone]  Name: Leslie Smith MRN: [id number] Date of Birth: 26-Apr-1951

## 2020-10-26 ENCOUNTER — Ambulatory Visit: Payer: Medicare Other | Admitting: Physical Therapy

## 2020-10-26 ENCOUNTER — Encounter: Payer: Self-pay | Admitting: Physical Therapy

## 2020-10-26 ENCOUNTER — Other Ambulatory Visit: Payer: Self-pay

## 2020-10-26 DIAGNOSIS — R269 Unspecified abnormalities of gait and mobility: Secondary | ICD-10-CM | POA: Diagnosis not present

## 2020-10-26 DIAGNOSIS — R278 Other lack of coordination: Secondary | ICD-10-CM

## 2020-10-26 DIAGNOSIS — M6281 Muscle weakness (generalized): Secondary | ICD-10-CM

## 2020-10-26 DIAGNOSIS — R262 Difficulty in walking, not elsewhere classified: Secondary | ICD-10-CM

## 2020-10-26 NOTE — Therapy (Signed)
[place] MAIN [facility] SERVICES [address] [place], [place], [address] Phone: [phone]   Fax:  [phone]  Physical Therapy Treatment  Patient Details  Name: Leslie Smith MRN: [id number] Date of Birth: 05-11-1951 Referring Provider (PT): Dr. Darlin Priestly   Encounter Date: 10/26/2020   PT End of Session - 10/26/20 1459    Visit Number 9    Number of Visits 25    Date for PT Re-Evaluation 12/20/20    Authorization Time Period 09/27/2020-12/20/2020    PT Start Time 1432    PT Stop Time 1515    PT Time Calculation (min) 43 min    Equipment Utilized During Treatment Gait belt    Activity Tolerance Patient tolerated treatment well    Behavior During Therapy [facility] for tasks assessed/performed           Past Medical History:  Diagnosis Date  . 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[place]) 09/15/2014  . Bladder incontinence   . Cancer [facility]) 2014   brain tumor, resected  . Cellulitis and abscess of leg 2019   2 episodes this year  . Complication of anesthesia   . DJD (degenerative joint disease) of knee 2018  . Fall   . Fatigue   . History of blood transfusion   . Hypertension   . MS (multiple sclerosis) ([place])   . Myelitis ([place])   . Neuromuscular disorder ([place]) 2001   multiple sclerosis diagnosed by MRI  . PONV (postoperative nausea and vomiting)    severe  . Thrombocytopenia ([place])     Past Surgical History:  Procedure Laterality Date  . ABDOMINAL HYSTERECTOMY  2011   required wound vac for 6 weeks. ovaries had grown attached to her back bone  . BRAIN SURGERY  2015   tumor resection-denies seizures  . BREAST SURGERY Left 2014   biopsy. negative  . HARDWARE REMOVAL Left 05/05/2018   Procedure: HARDWARE REMOVAL- LEFT ANKLE;  Surgeon: Hessie Knows, MD;  Location: [facility] ORS;  Service: Orthopedics;  Laterality: Left;  . I & D EXTREMITY Left 08/25/2015   Procedure: IRRIGATION AND DEBRIDEMENT THUMB;  Surgeon: Iran Planas, MD;  Location: [place];  Service:  Orthopedics;  Laterality: Left;  . ORIF ANKLE FRACTURE Left 02/20/2018   Procedure: OPEN REDUCTION INTERNAL FIXATION (ORIF) ANKLE FRACTURE;  Surgeon: Hessie Knows, MD;  Location: [facility] ORS;  Service: Orthopedics;  Laterality: Left;    There were no vitals filed for this visit.   Subjective Assessment - 10/26/20 1433    Subjective Patient reports doing well; She reports having some soreness after mowing the lawn. She reports some fatigue after doing a lot of activity.    Pertinent History Patient diagnosed with MS in 2001. Past medical history significant for HTN, MS, DJD right knee, Hyperlipidemia, Thrombocytopenia, S/p ORIF Left ankle fracture.    Limitations Lifting;Standing;Walking;House hold activities    How long can you sit comfortably? No restrictions    How long can you stand comfortably? 30 min maybe    How long can you walk comfortably? maybe 15 min    Patient Stated Goals Walk better and regain some lost strength.    Currently in Pain? No/denies    Pain Onset More than a month ago              [facility] PT Assessment - 10/26/20 0001      6 Minute Walk- Baseline   6 Minute Walk- Baseline yes    BP (mmHg) 163/66  HR (bpm) 72      6 Minute walk- Post Test   6 Minute Walk Post Test yes    BP (mmHg) 188/65    HR (bpm) 120    02 Sat (%RA) 93 %      6 minute walk test results    Aerobic Endurance Distance Walked 975    Endurance additional comments with 1 seated rest break      Standardized Balance Assessment   Standardized Balance Assessment Dynamic Gait Index      Dynamic Gait Index   Level Surface Normal    Change in Gait Speed Mild Impairment    Gait with Horizontal Head Turns Normal    Gait with Vertical Head Turns Normal    Gait and Pivot Turn Mild Impairment    Step Over Obstacle Mild Impairment    Step Around Obstacles Mild Impairment    Steps Moderate Impairment    Total Score 18           TREATMENT:   Instructed patient in 6 min walk, timed up and  go, DGI/ABC scale, FOTO survey, 5 times sit<>Stand to address goals. See below:  FUNCTIONAL OUTCOME MEASURES       Results (09/27/20) Results 10/26/20 Comments  DGI 18/24 18/24    TUG 15.15 Seconds 8.89 sec <12 sec indicates low fall risk  5TSTS  23 seconds 15.01 sec No hand hold assist, <15 sec indicates low fall risk  6 Minute Walk Test 695 feet (1 rest break) 975 feet ( 1 rest break)  1000 feet is community ambulator  10 Meter Gait Speed Self-selected: 11.98 s = 0.83 m/s  Below normative values for full community ambulation  ABC Scale 68.1%  78.125%  improved confidence  FOTO survey 61% 64%       Patient tolerated assessment well without increase in discomfort, mild fatigue reported;                     PT Education - 10/26/20 1459    Education Details progress towards goals,    Person(s) Educated Patient    Methods Explanation    Comprehension Verbalized understanding            PT Short Term Goals - 10/26/20 1520      PT SHORT TERM GOAL #1   Title Pt will be independent with HEP in order to improve strength and balance in order to decrease fall risk and improve function at home and work.    Baseline 09/27/2020- Patient has no formal HEP in place, 5/19: independent    Time 6    Period Weeks    Status Achieved    Target Date 11/08/20             PT Long Term Goals - 10/26/20 1521      PT LONG TERM GOAL #1   Title Pt will improve FOTO score from 61  to target score of 64 to display perceived improvements in ability to complete ADL's and functional mobility activities.    Baseline 09/27/2020: FOTO=61, 10/26/20: 64%    Time 12    Period Weeks    Status Achieved    Target Date 12/20/20      PT LONG TERM GOAL #2   Title Pt will improve DGI by at least 3 points in order to demonstrate clinically significant improvement in balance and decreased risk for falls.    Baseline 09/27/2020-DGI=18/24, 5/19: 18/24    Time 12  Period Weeks    Status Partially  Met    Target Date 12/20/20      PT LONG TERM GOAL #3   Title Pt will improve ABC by at least 10 % in order to demonstrate clinically significant improvement in balance confidence.    Baseline 09/27/2020-68.1%, 5/19: 78%    Time 12    Period Weeks    Status Achieved    Target Date 12/20/20      PT LONG TERM GOAL #4   Title Pt will decrease 5TSTS by at least 5 seconds (initial =23 sec with no UE support) in order to demonstrate clinically significant improvement in LE strength.    Baseline 09/27/2020= 23 sec. with No UE support, 5/19: 15 sec    Time 12    Period Weeks    Status Achieved    Target Date 12/20/20      PT LONG TERM GOAL #5   Title Pt will decrease TUG to below 14 seconds/decrease in order to demonstrate decreased fall risk.    Baseline 09/27/2020- TUG score= 15.15 sec without UE support, 5/19: 8.89 sec    Time 12    Period Weeks    Status Achieved    Target Date 12/20/20      PT LONG TERM GOAL #6   Title Patient will increase six minute walk test distance to >1000 for progression to community ambulator and improve gait ability    Baseline 4/26: 695 ft with one seated rest break, 5/19: 975 feet    Time 12    Period Weeks    Status Partially Met    Target Date 12/20/20                 Plan - 10/26/20 1522    Clinical Impression Statement Patient motivated and participated well within session. PT addressed goals this session. Patient has made significant improvements in gait and mobility. She continues to get short of breath with prolonged ambulation requiring short seated rest breaks. Patient also exhibits no significant change with dynamic balance as evidenced by dynamic gait index. DGI is limited with impaired stair negotiation and turning ability. she would benefit from additional skilled PT intervention to improve balance/gait safety and cardiovascular endurance for better mobility;    Personal Factors and Comorbidities Comorbidity 3+    Comorbidities HTN,  MS, Arthritis, COPD    Examination-Activity Limitations Bend;Carry;Continence;Lift;Squat;Stairs;Stand    Examination-Participation Restrictions Community Activity;Yard Work    Stability/Clinical Decision Making Stable/Uncomplicated    Rehab Potential Good    PT Frequency 2x / week    PT Duration 12 weeks    PT Treatment/Interventions ADLs/Self Care Home Management;Cryotherapy;Moist Heat;DME Instruction;Gait training;Stair training;Functional mobility training;Therapeutic activities;Therapeutic exercise;Balance training;Neuromuscular re-education;Patient/family education;Manual techniques;Passive range of motion    PT Next Visit Plan Instruct in initial LE stretngthening and balance exercises and add to HEP; Complete 6 min walk test and add goal    PT Home Exercise Plan To be initiated next 1-2 sessions    Consulted and Agree with Plan of Care Patient           Patient will benefit from skilled therapeutic intervention in order to improve the following deficits and impairments:  Abnormal gait,Cardiopulmonary status limiting activity,Decreased activity tolerance,Decreased balance,Decreased coordination,Decreased endurance,Decreased mobility,Decreased range of motion,Decreased strength,Difficulty walking,Impaired flexibility,Obesity,Pain  Visit Diagnosis: Abnormality of gait and mobility  Difficulty in walking, not elsewhere classified  Muscle weakness (generalized)  Other lack of coordination     Problem List Patient Active Problem List  Diagnosis Date Noted  . S/P ORIF (open reduction internal fixation) fracture 02/20/2018  . DJD (degenerative joint disease) of knee 01/02/2017  . Advanced care planning/counseling discussion 01/02/2017  . Thrombocytopenia ([place]) 10/30/2015  . Multiple sclerosis ([place])   . Essential hypertension   . Hyperlipidemia     Anija Brickner PT, DPT 10/26/2020, 3:25 PM  [place] MAIN [facility] SERVICES [address] [place], [place], [address] Phone: [phone]   Fax:  [phone]  Name: HILDRED PHARO MRN: [id number] Date of Birth: 13-May-1951

## 2020-10-31 ENCOUNTER — Other Ambulatory Visit: Payer: Self-pay

## 2020-10-31 ENCOUNTER — Ambulatory Visit: Payer: Medicare Other | Admitting: Physical Therapy

## 2020-10-31 ENCOUNTER — Encounter: Payer: Self-pay | Admitting: Physical Therapy

## 2020-10-31 DIAGNOSIS — M6281 Muscle weakness (generalized): Secondary | ICD-10-CM

## 2020-10-31 DIAGNOSIS — R278 Other lack of coordination: Secondary | ICD-10-CM

## 2020-10-31 DIAGNOSIS — R269 Unspecified abnormalities of gait and mobility: Secondary | ICD-10-CM

## 2020-10-31 DIAGNOSIS — R262 Difficulty in walking, not elsewhere classified: Secondary | ICD-10-CM

## 2020-10-31 NOTE — Therapy (Addendum)
[place] MAIN [facility] SERVICES [address] [place], [place], [address] Phone: [phone]   Fax:  [phone]  Physical Therapy Treatment Physical Therapy Progress Note   Dates of reporting period  09/27/20  to   10/31/20   Patient Details  Name: Leslie Smith MRN: [id number] Date of Birth: 10-04-1950 Referring Provider (PT): Dr. Darlin Priestly   Encounter Date: 10/31/2020   PT End of Session - 10/31/20 1126    Visit Number 10    Number of Visits 25    Date for PT Re-Evaluation 12/20/20    Authorization Time Period 09/27/2020-12/20/2020    PT Start Time 1119    PT Stop Time 1200    PT Time Calculation (min) 41 min    Equipment Utilized During Treatment Gait belt    Activity Tolerance Patient tolerated treatment well    Behavior During Therapy [facility] for tasks assessed/performed           Past Medical History:  Diagnosis Date  . 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 Bladder incontinence   . Cancer [facility]) 2014   brain tumor, resected  . Cellulitis and abscess of leg 2019   2 episodes this year  . Complication of anesthesia   . DJD (degenerative joint disease) of knee 2018  . Fall   . Fatigue   . History of blood transfusion   . Hypertension   . MS (multiple sclerosis) ([place])   . Myelitis ([place])   . Neuromuscular disorder ([place]) 2001   multiple sclerosis diagnosed by MRI  . PONV (postoperative nausea and vomiting)    severe  . Thrombocytopenia ([place])     Past Surgical History:  Procedure Laterality Date  . ABDOMINAL HYSTERECTOMY  2011   required wound vac for 6 weeks. ovaries had grown attached to her back bone  . BRAIN SURGERY  2015   tumor resection-denies seizures  . BREAST SURGERY Left 2014   biopsy. negative  . HARDWARE REMOVAL Left 05/05/2018   Procedure: HARDWARE REMOVAL- LEFT ANKLE;  Surgeon: Hessie Knows, MD;  Location: [facility] ORS;  Service: Orthopedics;  Laterality: Left;  . I & D EXTREMITY Left 08/25/2015   Procedure:  IRRIGATION AND DEBRIDEMENT THUMB;  Surgeon: Iran Planas, MD;  Location: [place];  Service: Orthopedics;  Laterality: Left;  . ORIF ANKLE FRACTURE Left 02/20/2018   Procedure: OPEN REDUCTION INTERNAL FIXATION (ORIF) ANKLE FRACTURE;  Surgeon: Hessie Knows, MD;  Location: [facility] ORS;  Service: Orthopedics;  Laterality: Left;    There were no vitals filed for this visit.   Subjective Assessment - 10/31/20 1125    Subjective Patient reports doing well; She reports having some soreness/knee popped out this morning after doing some housework.    Pertinent History Patient diagnosed with MS in 2001. Past medical history significant for HTN, MS, DJD right knee, Hyperlipidemia, Thrombocytopenia, S/p ORIF Left ankle fracture.    Limitations Lifting;Standing;Walking;House hold activities    How long can you sit comfortably? No restrictions    How long can you stand comfortably? 30 min maybe    How long can you walk comfortably? maybe 15 min    Patient Stated Goals Walk better and regain some lost strength.    Currently in Pain? Yes    Pain Score 10-Worst pain ever    Pain Location Knee    Pain Orientation Right    Pain Descriptors / Indicators Aching;Sore    Pain Type Chronic pain    Pain Onset More than  a month ago    Pain Frequency Intermittent    Aggravating Factors  increased activity/ after popping out    Pain Relieving Factors rest/pain meds    Effect of Pain on Daily Activities decreased activity tolerance;    Multiple Pain Sites No               TREATMENT:     Warm up walking around gym x3 laps (approximately 140 feet), fast/slow gait speed for challenge to dynamic balance with speed changes and to allow for breath recovery with increased cardiovascular demand of walking; Patient does report her right knee started giving out after 2nd lap with increased right knee pain;  Required close supervision for safety;x1 sets;  Seated: LAQ x15 reps to facilitate better knee ROM and reduce  discomfort;   Vitals assessed, HR 111, Spo2 95%  Neuro RE-ed with close CGA and cues for safety awareness and body mechanics: Standing with CGA next to support surface:  Standing on airex pad: -one foot on airex pad and one foot on 4 inch step:  BUE bar up/down x10 reps  BUE bar side/side rotation x10 reps; Patient required min VCs for balance stability, including to increase trunk control for less loss of balance with smaller base of support  Airex beam: -side stepping x3 laps each direction -tandem stance 30 sec hold x1 rep each foot in front -tandem stance holding ball up/down x10 reps; -feet apart, mini squat x10 reps;   Patient required CGA to min A with cues for weight shift for neutral stance position. She also required cues for proper step length while side stepping to avoid stepping over airex beam.   Standing on airex pad: -Heel raises unsupported 3 sec hold x10 reps; -toe taps from airex to 4 inch step unsupported when tapping with right foot x10 reps, requiring intermittent HHA tapping with left foot due to decreased stance tolerance on RLE -step ups airex to 4 inch step leading with left leg x10 reps unsupported, min A for safety;   Patient challenged today with progressive balance exercises oncompliant surface, challenging ankle strategies and weight shift. Patient instructed in unsupported heel raises to challenge ankle strategies with moderate difficulty reported.Patient continues to have shortness of breath with prolonged standing activity, requiring short rest breaks.    Patient's condition has the potential to improve in response to therapy. Maximum improvement is yet to be obtained. The anticipated improvement is attainable and reasonable in a generally predictable time.  Patient reports adherence with HEP and feels like she is improving; Goals were recently updated on 10/26/20 with significant improvement in all areas;                    PT  Education - 10/31/20 1126    Education Details LE strengthening/balance, HEP    Person(s) Educated Patient    Methods Explanation;Verbal cues    Comprehension Verbalized understanding;Returned demonstration;Verbal cues required;Need further instruction            PT Short Term Goals - 10/26/20 1520      PT SHORT TERM GOAL #1   Title Pt will be independent with HEP in order to improve strength and balance in order to decrease fall risk and improve function at home and work.    Baseline 09/27/2020- Patient has no formal HEP in place, 5/19: independent    Time 6    Period Weeks    Status Achieved    Target Date 11/08/20  PT Long Term Goals - 10/26/20 1521      PT LONG TERM GOAL #1   Title Pt will improve FOTO score from 61  to target score of 64 to display perceived improvements in ability to complete ADL's and functional mobility activities.    Baseline 09/27/2020: FOTO=61, 10/26/20: 64%    Time 12    Period Weeks    Status Achieved    Target Date 12/20/20      PT LONG TERM GOAL #2   Title Pt will improve DGI by at least 3 points in order to demonstrate clinically significant improvement in balance and decreased risk for falls.    Baseline 09/27/2020-DGI=18/24, 5/19: 18/24    Time 12    Period Weeks    Status Partially Met    Target Date 12/20/20      PT LONG TERM GOAL #3   Title Pt will improve ABC by at least 10 % in order to demonstrate clinically significant improvement in balance confidence.    Baseline 09/27/2020-68.1%, 5/19: 78%    Time 12    Period Weeks    Status Achieved    Target Date 12/20/20      PT LONG TERM GOAL #4   Title Pt will decrease 5TSTS by at least 5 seconds (initial =23 sec with no UE support) in order to demonstrate clinically significant improvement in LE strength.    Baseline 09/27/2020= 23 sec. with No UE support, 5/19: 15 sec    Time 12    Period Weeks    Status Achieved    Target Date 12/20/20      PT LONG TERM GOAL #5    Title Pt will decrease TUG to below 14 seconds/decrease in order to demonstrate decreased fall risk.    Baseline 09/27/2020- TUG score= 15.15 sec without UE support, 5/19: 8.89 sec    Time 12    Period Weeks    Status Achieved    Target Date 12/20/20      PT LONG TERM GOAL #6   Title Patient will increase six minute walk test distance to >1000 for progression to community ambulator and improve gait ability    Baseline 4/26: 695 ft with one seated rest break, 5/19: 975 feet    Time 12    Period Weeks    Status Partially Met    Target Date 12/20/20                 Plan - 10/31/20 1226    Clinical Impression Statement Patient motivated and participated well within session. She was instructed in advanced balance tasks. Patient continues to have difficulty shifting weight to RLE due to knee discomfort and instability. Patient instructed in balance tasks with less rail assist to facilitate better stance control. She continues to require min A to CGA for safety. Patient's goals were recently updated. She continues to make significant progress with improved gait, transfer and balance abilities. However she is still considered at an increased risk for falls and has been having difficulty with stair negotiation. She would benefit from additional skilled PT intervention to improve strength, balance and gait safety;    Personal Factors and Comorbidities Comorbidity 3+    Comorbidities HTN, MS, Arthritis, COPD    Examination-Activity Limitations Bend;Carry;Continence;Lift;Squat;Stairs;Stand    Examination-Participation Restrictions Community Activity;Yard Work    Stability/Clinical Decision Making Stable/Uncomplicated    Rehab Potential Good    PT Frequency 2x / week    PT Duration 12 weeks  PT Treatment/Interventions ADLs/Self Care Home Management;Cryotherapy;Moist Heat;DME Instruction;Gait training;Stair training;Functional mobility training;Therapeutic activities;Therapeutic exercise;Balance  training;Neuromuscular re-education;Patient/family education;Manual techniques;Passive range of motion    PT Next Visit Plan Instruct in initial LE stretngthening and balance exercises and add to HEP; Complete 6 min walk test and add goal    PT Home Exercise Plan To be initiated next 1-2 sessions    Consulted and Agree with Plan of Care Patient           Patient will benefit from skilled therapeutic intervention in order to improve the following deficits and impairments:  Abnormal gait,Cardiopulmonary status limiting activity,Decreased activity tolerance,Decreased balance,Decreased coordination,Decreased endurance,Decreased mobility,Decreased range of motion,Decreased strength,Difficulty walking,Impaired flexibility,Obesity,Pain  Visit Diagnosis: Abnormality of gait and mobility  Difficulty in walking, not elsewhere classified  Muscle weakness (generalized)  Other lack of coordination     Problem List Patient Active Problem List   Diagnosis Date Noted  . S/P ORIF (open reduction internal fixation) fracture 02/20/2018  . DJD (degenerative joint disease) of knee 01/02/2017  . Advanced care planning/counseling discussion 01/02/2017  . Thrombocytopenia ([place]) 10/30/2015  . Multiple sclerosis ([place])   . Essential hypertension   . Hyperlipidemia     Brynlea Spindler PT, DPT 10/31/2020, 1:27 PM  [place] [facility] MAIN [facility] SERVICES [address] [place], [place], [address] Phone: [phone]   Fax:  [phone]  Name: KEIRA BOHLIN MRN: [id number] Date of Birth: [birth year]

## 2020-11-02 ENCOUNTER — Other Ambulatory Visit: Payer: Self-pay

## 2020-11-02 ENCOUNTER — Ambulatory Visit: Payer: Medicare Other | Admitting: Physical Therapy

## 2020-11-02 ENCOUNTER — Encounter: Payer: Self-pay | Admitting: Physical Therapy

## 2020-11-02 DIAGNOSIS — R269 Unspecified abnormalities of gait and mobility: Secondary | ICD-10-CM

## 2020-11-02 DIAGNOSIS — M6281 Muscle weakness (generalized): Secondary | ICD-10-CM

## 2020-11-02 DIAGNOSIS — R278 Other lack of coordination: Secondary | ICD-10-CM

## 2020-11-02 DIAGNOSIS — R262 Difficulty in walking, not elsewhere classified: Secondary | ICD-10-CM

## 2020-11-02 NOTE — Therapy (Signed)
[place] MAIN [facility] SERVICES [address] [place], [place], [address] Phone: [phone]   Fax:  [phone]  Physical Therapy Treatment  Patient Details  Name: Leslie Smith MRN: [id number] Date of Birth: [birth year] Referring Provider (PT): Dr. Darlin Priestly   Encounter Date: 11/02/2020   PT End of Session - 11/02/20 1434    Visit Number 11    Number of Visits 25    Date for PT Re-Evaluation 12/20/20    Authorization Time Period 09/27/2020-12/20/2020    PT Start Time 1348    PT Stop Time 1432    PT Time Calculation (min) 44 min    Equipment Utilized During Treatment Gait belt    Activity Tolerance Patient tolerated treatment well    Behavior During Therapy [facility] for tasks assessed/performed           Past Medical History:  Diagnosis Date  . 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 Bladder incontinence   . Cancer [facility]) 2014   brain tumor, resected  . Cellulitis and abscess of leg 2019   2 episodes this year  . Complication of anesthesia   . DJD (degenerative joint disease) of knee 2018  . Fall   . Fatigue   . History of blood transfusion   . Hypertension   . MS (multiple sclerosis) ([place])   . Myelitis ([place])   . Neuromuscular disorder ([place]) 2001   multiple sclerosis diagnosed by MRI  . PONV (postoperative nausea and vomiting)    severe  . Thrombocytopenia ([place])     Past Surgical History:  Procedure Laterality Date  . ABDOMINAL HYSTERECTOMY  2011   required wound vac for 6 weeks. ovaries had grown attached to her back bone  . BRAIN SURGERY  2015   tumor resection-denies seizures  . BREAST SURGERY Left 2014   biopsy. negative  . HARDWARE REMOVAL Left 05/05/2018   Procedure: HARDWARE REMOVAL- LEFT ANKLE;  Surgeon: Hessie Knows, MD;  Location: [facility] ORS;  Service: Orthopedics;  Laterality: Left;  . I & D EXTREMITY Left 08/25/2015   Procedure: IRRIGATION AND DEBRIDEMENT THUMB;  Surgeon: Iran Planas, MD;  Location: [facility];  Service:  Orthopedics;  Laterality: Left;  . ORIF ANKLE FRACTURE Left 02/20/2018   Procedure: OPEN REDUCTION INTERNAL FIXATION (ORIF) ANKLE FRACTURE;  Surgeon: Hessie Knows, MD;  Location: [facility] ORS;  Service: Orthopedics;  Laterality: Left;    There were no vitals filed for this visit.   Subjective Assessment - 11/02/20 1353    Subjective Patient reports having a hard time. She went to see her PCP who didn't like her kidney results so he took her off her fluid pill. She reports since being off the meds she has since gained 8 pounds and feels swollen.    Pertinent History Patient diagnosed with MS in 2001. Past medical history significant for HTN, MS, DJD right knee, Hyperlipidemia, Thrombocytopenia, S/p ORIF Left ankle fracture.    Limitations Lifting;Standing;Walking;House hold activities    How long can you sit comfortably? No restrictions    How long can you stand comfortably? 30 min maybe    How long can you walk comfortably? maybe 15 min    Patient Stated Goals Walk better and regain some lost strength.    Currently in Pain? Yes    Pain Score 5     Pain Location Knee    Pain Orientation Right    Pain Descriptors / Indicators Aching;Sore    Pain Type Chronic pain  Pain Onset More than a month ago    Pain Frequency Intermittent    Aggravating Factors  increased activity/knee popping out    Pain Relieving Factors rest/pain meds    Effect of Pain on Daily Activities decreased activity tolerance;    Multiple Pain Sites No            TREATMENT: BP: 124/47- HR 67, at rest     TREATMENT:     Warm up walking around gymx3 laps (approximately 140fet), comfortable pace;  Required close supervision for safety;x1sets;  Neuro RE-ed with close CGA and cues for safety awareness and body mechanics: Standing with CGA next to support surface:  Standing on airex pad: Feet together: eyes open 30 sec hold, eyes closed 10 sec hold x3 reps each; -Alternate toe taps airex pad to 4 inch  step with 1 rail assist x15 reps each;  -one foot on airex pad and one foot on 4 inch step:             -head turns side/side x5 reps each;  Patient required min VCs for balance stability, including to increase trunk control for less loss of balance with smaller base of support   Stepping over orange hurdle: Forward/backward x10 reps; Side stepping x10 reps each direction Required intermittent rail assist for balance/safety;    BP after exercise 143/43, HR 81  Concerned about drop in diastolic with activity. In addition, patient reports she still has to take 2nd dose of her BP medication. PT sent a message to MD to inform him of her vitals/concerns.  Patient reports increased fatigue at end of session. Educated patient in ways to help with LE swelling including LE elevated with ankle pump;                        PT Education - 11/02/20 1434    Education Details balance/gait safety; vitals;    Person(s) Educated Patient    Methods Explanation    Comprehension Verbalized understanding            PT Short Term Goals - 10/26/20 1520      PT SHORT TERM GOAL #1   Title Pt will be independent with HEP in order to improve strength and balance in order to decrease fall risk and improve function at home and work.    Baseline 09/27/2020- Patient has no formal HEP in place, 5/19: independent    Time 6    Period Weeks    Status Achieved    Target Date 11/08/20             PT Long Term Goals - 10/26/20 1521      PT LONG TERM GOAL #1   Title Pt will improve FOTO score from 61  to target score of 64 to display perceived improvements in ability to complete ADL's and functional mobility activities.    Baseline 09/27/2020: FOTO=61, 10/26/20: 64%    Time 12    Period Weeks    Status Achieved    Target Date 12/20/20      PT LONG TERM GOAL #2   Title Pt will improve DGI by at least 3 points in order to demonstrate clinically significant improvement in balance and  decreased risk for falls.    Baseline 09/27/2020-DGI=18/24, 5/19: 18/24    Time 12    Period Weeks    Status Partially Met    Target Date 12/20/20      PT LONG TERM  GOAL #3   Title Pt will improve ABC by at least 10 % in order to demonstrate clinically significant improvement in balance confidence.    Baseline 09/27/2020-68.1%, 5/19: 78%    Time 12    Period Weeks    Status Achieved    Target Date 12/20/20      PT LONG TERM GOAL #4   Title Pt will decrease 5TSTS by at least 5 seconds (initial =23 sec with no UE support) in order to demonstrate clinically significant improvement in LE strength.    Baseline 09/27/2020= 23 sec. with No UE support, 5/19: 15 sec    Time 12    Period Weeks    Status Achieved    Target Date 12/20/20      PT LONG TERM GOAL #5   Title Pt will decrease TUG to below 14 seconds/decrease in order to demonstrate decreased fall risk.    Baseline 09/27/2020- TUG score= 15.15 sec without UE support, 5/19: 8.89 sec    Time 12    Period Weeks    Status Achieved    Target Date 12/20/20      PT LONG TERM GOAL #6   Title Patient will increase six minute walk test distance to >1000 for progression to community ambulator and improve gait ability    Baseline 4/26: 695 ft with one seated rest break, 5/19: 975 feet    Time 12    Period Weeks    Status Partially Met    Target Date 12/20/20                 Plan - 11/02/20 1401    Clinical Impression Statement MD recently adjusted medication, taking patient off fluid pill and prescribing new HTN medication; Monitored vitals this session; Patient reports increased swelling as a result of recent medication changes. Patient does exhibit low diastolic BP. Reached out to MD to notify. Patient instructed in advanced balance tasks. She did have difficulty with RLE stance due to knee discomfort. She also required increased rail assist this session due to fatigue and imbalance. Patient would benefit from additional skilled PT  Intervention to improve strength, balance and mobility;    Personal Factors and Comorbidities Comorbidity 3+    Comorbidities HTN, MS, Arthritis, COPD    Examination-Activity Limitations Bend;Carry;Continence;Lift;Squat;Stairs;Stand    Examination-Participation Restrictions Community Activity;Yard Work    Stability/Clinical Decision Making Stable/Uncomplicated    Rehab Potential Good    PT Frequency 2x / week    PT Duration 12 weeks    PT Treatment/Interventions ADLs/Self Care Home Management;Cryotherapy;Moist Heat;DME Instruction;Gait training;Stair training;Functional mobility training;Therapeutic activities;Therapeutic exercise;Balance training;Neuromuscular re-education;Patient/family education;Manual techniques;Passive range of motion    PT Next Visit Plan Instruct in initial LE stretngthening and balance exercises and add to HEP; Complete 6 min walk test and add goal    PT Home Exercise Plan To be initiated next 1-2 sessions    Consulted and Agree with Plan of Care Patient           Patient will benefit from skilled therapeutic intervention in order to improve the following deficits and impairments:  Abnormal gait,Cardiopulmonary status limiting activity,Decreased activity tolerance,Decreased balance,Decreased coordination,Decreased endurance,Decreased mobility,Decreased range of motion,Decreased strength,Difficulty walking,Impaired flexibility,Obesity,Pain  Visit Diagnosis: Abnormality of gait and mobility  Difficulty in walking, not elsewhere classified  Muscle weakness (generalized)  Other lack of coordination     Problem List Patient Active Problem List   Diagnosis Date Noted  . S/P ORIF (open reduction internal fixation) fracture 02/20/2018  .  DJD (degenerative joint disease) of knee 01/02/2017  . Advanced care planning/counseling discussion 01/02/2017  . Thrombocytopenia ([place]) 10/30/2015  . Multiple sclerosis ([place])   . Essential hypertension   . Hyperlipidemia      Trotter,Margaret PT, DPT 11/02/2020, 2:35 PM  [place] MAIN [facility] SERVICES [address] [place], [place], [address] Phone: [phone]   Fax:  [phone]  Name: Leslie Smith MRN: [id number] Date of Birth: April 17, 1951

## 2020-11-07 ENCOUNTER — Ambulatory Visit: Payer: Medicare Other | Admitting: Physical Therapy

## 2020-11-09 ENCOUNTER — Ambulatory Visit: Payer: Medicare Other | Admitting: Physical Therapy

## 2020-11-14 ENCOUNTER — Ambulatory Visit: Payer: Medicare Other | Attending: Neurology

## 2020-11-14 ENCOUNTER — Other Ambulatory Visit: Payer: Self-pay

## 2020-11-14 DIAGNOSIS — R269 Unspecified abnormalities of gait and mobility: Secondary | ICD-10-CM | POA: Diagnosis not present

## 2020-11-14 DIAGNOSIS — R262 Difficulty in walking, not elsewhere classified: Secondary | ICD-10-CM | POA: Insufficient documentation

## 2020-11-14 DIAGNOSIS — M6281 Muscle weakness (generalized): Secondary | ICD-10-CM | POA: Insufficient documentation

## 2020-11-14 DIAGNOSIS — R278 Other lack of coordination: Secondary | ICD-10-CM | POA: Diagnosis present

## 2020-11-14 NOTE — Therapy (Signed)
[place] MAIN [facility] SERVICES [address] [place], [place], [address] Phone: [phone]   Fax:  [phone]  Physical Therapy Treatment  Patient Details  Name: Leslie Smith MRN: [id number] Date of Birth: 1950/07/09 Referring Provider (PT): Dr. Darlin Priestly   Encounter Date: 11/14/2020   PT End of Session - 11/14/20 1709    Visit Number 12    Number of Visits 25    Date for PT Re-Evaluation 12/20/20    Authorization Time Period 09/27/2020-12/20/2020    PT Start Time 0932    PT Stop Time 1013    PT Time Calculation (min) 41 min    Equipment Utilized During Treatment Gait belt    Activity Tolerance Patient tolerated treatment well    Behavior During Therapy [facility] for tasks assessed/performed           Past Medical History:  Diagnosis Date  . 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 Bladder incontinence   . Cancer [facility]) 2014   brain tumor, resected  . Cellulitis and abscess of leg 2019   2 episodes this year  . Complication of anesthesia   . DJD (degenerative joint disease) of knee 2018  . Fall   . Fatigue   . History of blood transfusion   . Hypertension   . MS (multiple sclerosis) ([place])   . Myelitis ([place])   . Neuromuscular disorder ([place]) 2001   multiple sclerosis diagnosed by MRI  . PONV (postoperative nausea and vomiting)    severe  . Thrombocytopenia ([place])     Past Surgical History:  Procedure Laterality Date  . ABDOMINAL HYSTERECTOMY  2011   required wound vac for 6 weeks. ovaries had grown attached to her back bone  . BRAIN SURGERY  2015   tumor resection-denies seizures  . BREAST SURGERY Left 2014   biopsy. negative  . HARDWARE REMOVAL Left 05/05/2018   Procedure: HARDWARE REMOVAL- LEFT ANKLE;  Surgeon: Hessie Knows, MD;  Location: [facility] ORS;  Service: Orthopedics;  Laterality: Left;  . I & D EXTREMITY Left 08/25/2015   Procedure: IRRIGATION AND DEBRIDEMENT THUMB;  Surgeon: Iran Planas, MD;  Location: [facility];  Service:  Orthopedics;  Laterality: Left;  . ORIF ANKLE FRACTURE Left 02/20/2018   Procedure: OPEN REDUCTION INTERNAL FIXATION (ORIF) ANKLE FRACTURE;  Surgeon: Hessie Knows, MD;  Location: [facility] ORS;  Service: Orthopedics;  Laterality: Left;    BP: 157/50 mmHg right UE  HR= 94 bpm.   Nustep=L1  LE only x 5 min- VC to keep SPM > 60 and VC and assist with set up. Patient denied any pain and rated difficulty as "medium"  Step up with 4lb onto purple pad with no UE support in // bars- 10 reps each leg- Mild difficulty exhibited initially with decreased step height- tripping slightly- catching toe on edge of pad but did improve with practice.   Standing on purple pad with horizontal and vertical head turns with feet together- Patient primarily using an ankle strategy with some A/P sway yet no loss of balance.   Dynamic mini march with 4lb while standing on purple pad and No UE support. - Minimal height of march yet no loss of balance   Step over obstacles in //bars: Forward step onto purple pad then over 1/2 white foam roll then over PVC.- Down and back x 3 trials without UE support. Patient demo no significant difficulty.   Side step over same obstacles as above - 3 trials down and back  with 1 UE support on bar. - Patient more challenged than forward step yet able to complete- only complaining of mild fatigue.   Rechecked BP after activities: 134/74 mmHg Right UE  Clinical Impression: Patient performed well with balance activities despite not feeling well. She was able to negotiate the 4lb. Ankle weights with step ups and demonstrated good dynamic standing balance with all activities today. She was fatigued at end of session but denied any pain or shortness of breath. Patient would benefit from additional skilled PT Intervention to improve strength, balance and mobility;   Subjective Assessment - 11/14/20 0945    Subjective Patient reports still not feeling great. Reports still off the fluid pill and has  not been sleeping well due to trying to get used to her new CPAP.    Pertinent History Patient diagnosed with MS in 2001. Past medical history significant for HTN, MS, DJD right knee, Hyperlipidemia, Thrombocytopenia, S/p ORIF Left ankle fracture.    Limitations Lifting;Standing;Walking;House hold activities    How long can you sit comfortably? No restrictions    How long can you stand comfortably? 30 min maybe    How long can you walk comfortably? maybe 15 min    Patient Stated Goals Walk better and regain some lost strength.    Currently in Pain? Yes    Pain Score --   bilateral foot pain   Pain Location Foot    Pain Onset More than a month ago                                     PT Education - 11/14/20 1708    Education Details Balance education    Person(s) Educated Patient    Methods Explanation;Demonstration;Tactile cues;Verbal cues    Comprehension Verbalized understanding;Returned demonstration;Verbal cues required;Tactile cues required;Need further instruction            PT Short Term Goals - 10/26/20 1520      PT SHORT TERM GOAL #1   Title Pt will be independent with HEP in order to improve strength and balance in order to decrease fall risk and improve function at home and work.    Baseline 09/27/2020- Patient has no formal HEP in place, 5/19: independent    Time 6    Period Weeks    Status Achieved    Target Date 11/08/20             PT Long Term Goals - 10/26/20 1521      PT LONG TERM GOAL #1   Title Pt will improve FOTO score from 61  to target score of 64 to display perceived improvements in ability to complete ADL's and functional mobility activities.    Baseline 09/27/2020: FOTO=61, 10/26/20: 64%    Time 12    Period Weeks    Status Achieved    Target Date 12/20/20      PT LONG TERM GOAL #2   Title Pt will improve DGI by at least 3 points in order to demonstrate clinically significant improvement in balance and decreased risk  for falls.    Baseline 09/27/2020-DGI=18/24, 5/19: 18/24    Time 12    Period Weeks    Status Partially Met    Target Date 12/20/20      PT LONG TERM GOAL #3   Title Pt will improve ABC by at least 10 % in order to demonstrate clinically significant improvement  in balance confidence.    Baseline 09/27/2020-68.1%, 5/19: 78%    Time 12    Period Weeks    Status Achieved    Target Date 12/20/20      PT LONG TERM GOAL #4   Title Pt will decrease 5TSTS by at least 5 seconds (initial =23 sec with no UE support) in order to demonstrate clinically significant improvement in LE strength.    Baseline 09/27/2020= 23 sec. with No UE support, 5/19: 15 sec    Time 12    Period Weeks    Status Achieved    Target Date 12/20/20      PT LONG TERM GOAL #5   Title Pt will decrease TUG to below 14 seconds/decrease in order to demonstrate decreased fall risk.    Baseline 09/27/2020- TUG score= 15.15 sec without UE support, 5/19: 8.89 sec    Time 12    Period Weeks    Status Achieved    Target Date 12/20/20      PT LONG TERM GOAL #6   Title Patient will increase six minute walk test distance to >1000 for progression to community ambulator and improve gait ability    Baseline 4/26: 695 ft with one seated rest break, 5/19: 975 feet    Time 12    Period Weeks    Status Partially Met    Target Date 12/20/20                 Plan - 11/14/20 1710    Clinical Impression Statement Patient performed well with balance activities despite not feeling well. She was able to negotiate the 4lb. Ankle weights with step ups and demonstrated good dynamic standing balance with all activities today. She was fatigued at end of session but denied any pain or shortness of breath. Patient would benefit from additional skilled PT Intervention to improve strength, balance and mobility;    Personal Factors and Comorbidities Comorbidity 3+    Comorbidities HTN, MS, Arthritis, COPD    Examination-Activity Limitations  Bend;Carry;Continence;Lift;Squat;Stairs;Stand    Examination-Participation Restrictions Community Activity;Yard Work    Stability/Clinical Decision Making Stable/Uncomplicated    Rehab Potential Good    PT Frequency 2x / week    PT Duration 12 weeks    PT Treatment/Interventions ADLs/Self Care Home Management;Cryotherapy;Moist Heat;DME Instruction;Gait training;Stair training;Functional mobility training;Therapeutic activities;Therapeutic exercise;Balance training;Neuromuscular re-education;Patient/family education;Manual techniques;Passive range of motion    PT Next Visit Plan Continue with progressive LE strengthening, dynamic balance training and functional endurance training. Continue to monitor vitals.    PT Home Exercise Plan no changes to HEP    Consulted and Agree with Plan of Care Patient           Patient will benefit from skilled therapeutic intervention in order to improve the following deficits and impairments:  Abnormal gait,Cardiopulmonary status limiting activity,Decreased activity tolerance,Decreased balance,Decreased coordination,Decreased endurance,Decreased mobility,Decreased range of motion,Decreased strength,Difficulty walking,Impaired flexibility,Obesity,Pain  Visit Diagnosis: Abnormality of gait and mobility  Difficulty in walking, not elsewhere classified  Muscle weakness (generalized)  Other lack of coordination     Problem List Patient Active Problem List   Diagnosis Date Noted  . S/P ORIF (open reduction internal fixation) fracture 02/20/2018  . DJD (degenerative joint disease) of knee 01/02/2017  . Advanced care planning/counseling discussion 01/02/2017  . Thrombocytopenia ([place]) 10/30/2015  . Multiple sclerosis ([place])   . Essential hypertension   . Hyperlipidemia     Lewis Moccasin, PT 11/15/2020, 7:21 AM  [place]  MAIN [facility] SERVICES [place], [place], [address] Phone: [phone]    Fax:  [phone]  Name: Leslie Smith MRN: [id number] Date of Birth: May 18, 1951

## 2020-11-16 ENCOUNTER — Ambulatory Visit: Payer: Medicare Other | Admitting: Physical Therapy

## 2020-11-16 ENCOUNTER — Other Ambulatory Visit: Payer: Self-pay

## 2020-11-16 ENCOUNTER — Encounter: Payer: Self-pay | Admitting: Physical Therapy

## 2020-11-16 ENCOUNTER — Ambulatory Visit: Payer: Medicare Other

## 2020-11-16 DIAGNOSIS — M6281 Muscle weakness (generalized): Secondary | ICD-10-CM

## 2020-11-16 DIAGNOSIS — R269 Unspecified abnormalities of gait and mobility: Secondary | ICD-10-CM | POA: Diagnosis not present

## 2020-11-16 DIAGNOSIS — R262 Difficulty in walking, not elsewhere classified: Secondary | ICD-10-CM

## 2020-11-16 DIAGNOSIS — R278 Other lack of coordination: Secondary | ICD-10-CM

## 2020-11-16 NOTE — Therapy (Signed)
[place] MAIN [facility] SERVICES [address] [place], [place], [address] Phone: [phone]   Fax:  [phone]  Physical Therapy Treatment  Patient Details  Name: Leslie Smith MRN: [id number] Date of Birth: Apr 10, 1951 Referring Provider (PT): Dr. 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   Bladder incontinence    Cancer ([place]) 2014   brain tumor, resected   Cellulitis and abscess of leg 2019   2 episodes this year   Complication of anesthesia    DJD (degenerative joint disease) of knee 2018   Fall    Fatigue    History of blood transfusion    Hypertension    MS (multiple sclerosis) ([place])    Myelitis ([place])    Neuromuscular disorder ([place]) 2001   multiple sclerosis diagnosed by MRI   PONV (postoperative nausea and vomiting)    severe   Thrombocytopenia ([place])     Past Surgical History:  Procedure Laterality Date   ABDOMINAL HYSTERECTOMY  2011   required wound vac for 6 weeks. ovaries had grown attached to her back bone   BRAIN SURGERY  2015   tumor resection-denies seizures   BREAST SURGERY Left 2014   biopsy. negative   HARDWARE REMOVAL Left 05/05/2018   Procedure: HARDWARE REMOVAL- LEFT ANKLE;  Surgeon: Hessie Knows, MD;  Location: [facility];  Service: Orthopedics;  Laterality: Left;   I & D EXTREMITY Left 08/25/2015   Procedure: IRRIGATION AND DEBRIDEMENT THUMB;  Surgeon: Iran Planas, MD;  Location: [place];  Service: Orthopedics;   Laterality: Left;   ORIF ANKLE FRACTURE Left 02/20/2018   Procedure: OPEN REDUCTION INTERNAL FIXATION (ORIF) ANKLE FRACTURE;  Surgeon: Hessie Knows, MD;  Location: [facility];  Service: Orthopedics;  Laterality: Left;    There were no vitals filed for this visit.   Subjective Assessment - 11/16/20 1358     Subjective Patient reports still not feeling great. Reports having increased swelling and not sleeping well due to CPAP. She is very fatigued and short of breath, unsure if this is part of MS, heart stuff or something else. Reports intermittent chest tightness;    Pertinent History Patient diagnosed with MS in 2001. Past medical history significant for HTN, MS, DJD right knee, Hyperlipidemia, Thrombocytopenia, S/p ORIF Left ankle fracture.    Limitations Lifting;Standing;Walking;House hold activities    How long can you sit comfortably? No restrictions    How long can you stand comfortably? 30 min maybe    How long can you walk comfortably? maybe 15 min    Patient Stated Goals Walk better and regain some lost strength.    Currently in Pain? No/denies    Pain Onset More than a month ago    Multiple Pain Sites No  TREATMENT: Vitals at start of session: 159/55, HR90, SPo2 98%  Standing on airex pad Feet together: Eyes open x20 sec, eyes closed x15 sec unsupported x2 sets each with CGA to min A  Feet apart: Unsupported heel/toe rock x15 reps with CGA for safety  Gait around gym x160 feet with cues for erect posture and to increase step length; Patient became short of breath, vitals reassessed: BP: 178/45, HR 114, SPo2 97%  Instructed patient in exercise on Nustep BUE/BLE level 1 x4 min with cues for steps per minute >60; vitals monitored; Patient tolerated well with less shortness of breath;  Final BP: 174/53  Patient reports feeling bad today. Session limited due to symptoms. Recommend patient follow up with MD. Will send vitals information to MD.                           PT Education - 11/16/20 1359     Education Details balance/strengthening, HEP    Person(s) Educated Patient    Methods Explanation;Verbal cues    Comprehension Verbalized understanding;Returned demonstration;Verbal cues required              PT Short Term Goals - 10/26/20 1520       PT SHORT TERM GOAL #1   Title Pt will be independent with HEP in order to improve strength and balance in order to decrease fall risk and improve function at home and work.    Baseline 09/27/2020- Patient has no formal HEP in place, 5/19: independent    Time 6    Period Weeks    Status Achieved    Target Date 11/08/20               PT Long Term Goals - 10/26/20 1521       PT LONG TERM GOAL #1   Title Pt will improve FOTO score from 61  to target score of 64 to display perceived improvements in ability to complete ADL's and functional mobility activities.    Baseline 09/27/2020: FOTO=61, 10/26/20: 64%    Time 12    Period Weeks    Status Achieved    Target Date 12/20/20      PT LONG TERM GOAL #2   Title Pt will improve DGI by at least 3 points in order to demonstrate clinically significant improvement in balance and decreased risk for falls.    Baseline 09/27/2020-DGI=18/24, 5/19: 18/24    Time 12    Period Weeks    Status Partially Met    Target Date 12/20/20      PT LONG TERM GOAL #3   Title Pt will improve ABC by at least 10 % in order to demonstrate clinically significant improvement in balance confidence.    Baseline 09/27/2020-68.1%, 5/19: 78%    Time 12    Period Weeks    Status Achieved    Target Date 12/20/20      PT LONG TERM GOAL #4   Title Pt will decrease 5TSTS by at least 5 seconds (initial =23 sec with no UE support) in order to demonstrate clinically significant improvement in LE strength.    Baseline 09/27/2020= 23 sec. with No UE support, 5/19: 15 sec    Time 12    Period Weeks    Status Achieved    Target Date  12/20/20      PT LONG TERM GOAL #5   Title Pt will decrease TUG to below 14 seconds/decrease in order to  demonstrate decreased fall risk.    Baseline 09/27/2020- TUG score= 15.15 sec without UE support, 5/19: 8.89 sec    Time 12    Period Weeks    Status Achieved    Target Date 12/20/20      PT LONG TERM GOAL #6   Title Patient will increase six minute walk test distance to >1000 for progression to community ambulator and improve gait ability    Baseline 4/26: 695 ft with one seated rest break, 5/19: 975 feet    Time 12    Period Weeks    Status Partially Met    Target Date 12/20/20                   Plan - 11/16/20 1436     Clinical Impression Statement Patient reports feeling short of breath and extreme fatigue. She has been having medical issues with recent change in BP meds and having to wear a CPAP. Session limited due to feeling poorly. Vitals monitored. During session, concerned that her systolic increased but diastolic decreased. Patient has reached out to MD and is waiting on call back. PT sent message with vitals update. Will continue to re-assess and monitor. She would benefit from additional skilled PT Intervention to improve strength, balance and mobility;    Personal Factors and Comorbidities Comorbidity 3+    Comorbidities HTN, MS, Arthritis, COPD    Examination-Activity Limitations Bend;Carry;Continence;Lift;Squat;Stairs;Stand    Examination-Participation Restrictions Community Activity;Yard Work    Stability/Clinical Decision Making Stable/Uncomplicated    Rehab Potential Good    PT Frequency 2x / week    PT Duration 12 weeks    PT Treatment/Interventions ADLs/Self Care Home Management;Cryotherapy;Moist Heat;DME Instruction;Gait training;Stair training;Functional mobility training;Therapeutic activities;Therapeutic exercise;Balance training;Neuromuscular re-education;Patient/family education;Manual techniques;Passive range of motion    PT Next Visit Plan  Continue with progressive LE strengthening, dynamic balance training and functional endurance training. Continue to monitor vitals.    PT Home Exercise Plan no changes to HEP    Consulted and Agree with Plan of Care Patient             Patient will benefit from skilled therapeutic intervention in order to improve the following deficits and impairments:  Abnormal gait, Cardiopulmonary status limiting activity, Decreased activity tolerance, Decreased balance, Decreased coordination, Decreased endurance, Decreased mobility, Decreased range of motion, Decreased strength, Difficulty walking, Impaired flexibility, Obesity, Pain  Visit Diagnosis: Abnormality of gait and mobility  Difficulty in walking, not elsewhere classified  Muscle weakness (generalized)  Other lack of coordination     Problem List Patient Active Problem List   Diagnosis Date Noted   S/P ORIF (open reduction internal fixation) fracture 02/20/2018   DJD (degenerative joint disease) of knee 01/02/2017   Advanced care planning/counseling discussion 01/02/2017   Thrombocytopenia ([place]) 10/30/2015   Multiple sclerosis ([place])    Essential hypertension    Hyperlipidemia     Traquan Duarte PT, DPT 11/16/2020, 2:42 PM  [place] [address] [place], [place], [address] Phone: [phone]   Fax:  [phone]  Name: BRENLEIGH COLLET MRN: [id number] Date of Birth: 28-Mar-1951

## 2020-11-17 ENCOUNTER — Other Ambulatory Visit: Payer: Self-pay | Admitting: Internal Medicine

## 2020-11-17 DIAGNOSIS — N179 Acute kidney failure, unspecified: Secondary | ICD-10-CM

## 2020-11-17 DIAGNOSIS — I10 Essential (primary) hypertension: Secondary | ICD-10-CM

## 2020-11-17 DIAGNOSIS — R1084 Generalized abdominal pain: Secondary | ICD-10-CM

## 2020-11-21 ENCOUNTER — Ambulatory Visit: Payer: Medicare Other

## 2020-11-23 ENCOUNTER — Ambulatory Visit: Payer: Medicare Other | Admitting: Physical Therapy

## 2020-11-24 ENCOUNTER — Ambulatory Visit: Payer: Medicare Other

## 2020-11-28 ENCOUNTER — Ambulatory Visit: Payer: Medicare Other | Admitting: Physical Therapy

## 2020-11-28 ENCOUNTER — Encounter: Payer: Self-pay | Admitting: Physical Therapy

## 2020-11-28 ENCOUNTER — Ambulatory Visit: Payer: Medicare Other

## 2020-11-28 ENCOUNTER — Other Ambulatory Visit: Payer: Self-pay

## 2020-11-28 DIAGNOSIS — R269 Unspecified abnormalities of gait and mobility: Secondary | ICD-10-CM | POA: Diagnosis not present

## 2020-11-28 DIAGNOSIS — R262 Difficulty in walking, not elsewhere classified: Secondary | ICD-10-CM

## 2020-11-28 DIAGNOSIS — M6281 Muscle weakness (generalized): Secondary | ICD-10-CM

## 2020-11-28 DIAGNOSIS — R278 Other lack of coordination: Secondary | ICD-10-CM

## 2020-11-28 NOTE — Therapy (Signed)
[place] MAIN [facility] SERVICES [address] [place], [place], [address] Phone: [phone]   Fax:  [phone]  Physical Therapy Treatment  Patient Details  Name: Leslie Smith MRN: [id number] Date of Birth: January 31, 1951 Referring Provider (PT): Dr. 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   Bladder incontinence    Cancer ([place]) 2014   brain tumor, resected   Cellulitis and abscess of leg 2019   2 episodes this year   Complication of anesthesia    DJD (degenerative joint disease) of knee 2018   Fall    Fatigue    History of blood transfusion    Hypertension    MS (multiple sclerosis) ([place])    Myelitis ([place])    Neuromuscular disorder ([place]) 2001   multiple sclerosis diagnosed by MRI   PONV (postoperative nausea and vomiting)    severe   Thrombocytopenia ([place])     Past Surgical History:  Procedure Laterality Date   ABDOMINAL HYSTERECTOMY  2011   required wound vac for 6 weeks. ovaries had grown attached to her back bone   BRAIN SURGERY  2015   tumor resection-denies seizures   BREAST SURGERY Left 2014   biopsy. negative   HARDWARE REMOVAL Left 05/05/2018   Procedure: HARDWARE REMOVAL- LEFT ANKLE;  Surgeon: Hessie Knows, MD;  Location: [facility];  Service: Orthopedics;  Laterality: Left;   I & D EXTREMITY Left 08/25/2015   Procedure: IRRIGATION AND DEBRIDEMENT THUMB;  Surgeon: Iran Planas, MD;  Location: [place];  Service: Orthopedics;   Laterality: Left;   ORIF ANKLE FRACTURE Left 02/20/2018   Procedure: OPEN REDUCTION INTERNAL FIXATION (ORIF) ANKLE FRACTURE;  Surgeon: Hessie Knows, MD;  Location: [facility];  Service: Orthopedics;  Laterality: Left;    There were no vitals filed for this visit.   Subjective Assessment - 11/28/20 1102     Subjective Patient reports feeling a little better. She is still short of breath. Patient reports she did get ECHO which showed congestion around her heart. She did start back on a fluid pill which is helping some of her swelling.    Pertinent History Patient diagnosed with MS in 2001. Past medical history significant for HTN, MS, DJD right knee, Hyperlipidemia, Thrombocytopenia, S/p ORIF Left ankle fracture.    Limitations Lifting;Standing;Walking;House hold activities    How long can you sit comfortably? No restrictions    How long can you stand comfortably? 30 min maybe    How long can you walk comfortably? maybe 15 min    Patient Stated Goals Walk better and regain some lost strength.    Currently in Pain? No/denies    Pain Onset More than a month ago    Multiple Pain Sites No             TREATMENT:   Warm up  on Nustep BUE/BLE level 2 x5 min (unbilled);   Standing in parallel bars with green tband around BLE: side stepping x10 feet x3 laps each direction;   Walking around gym x1 lap regular walking speed, (approximately 140 feet),  Progressed to gait around gym x2 laps regular speed to challenge cardiovascular endurance. Required close supervision for safety;     Seated: LAQ x15 reps to facilitate better knee ROM and reduce discomfort;    Vitals assessed, HR 105, Spo2 99%   NMR:  Standing with CGA next to support surface:  Standing on airex pad: -one foot on airex pad and one foot on 4 inch step:             BUE bar up/down x10 reps             BUE bar side/side rotation x10 reps; Patient required min VCs for balance stability, including to increase trunk control for  less loss of balance with smaller base of support   Airex beam: -side stepping x3 laps each direction -tandem gait x4 laps without rail assist, with close supervision to CGA -feet apart, reaching with cones in various directions x6 cones each UE   Patient required close supervision to CGA with cues for weight shift for neutral stance position. She also required cues for proper step length while side stepping to avoid stepping over airex beam.   Standing on airex pad: -Heel raises unsupported 3 sec hold x15 reps; -toe taps from airex to 4 inch step unsupported when tapping with right foot x15 reps, requiring intermittent HHA tapping with left foot due to decreased stance tolerance on RLE -step ups airex to 4 inch step leading with left leg x10 reps unsupported, min A for safety;     Patient challenged today with progressive balance exercises on compliant surface, challenging ankle strategies and weight shift.  Patient instructed in unsupported heel raises to challenge ankle strategies with moderate difficulty reported. Patient continues to have shortness of breath with prolonged standing activity, requiring short rest breaks.                             PT Education - 11/28/20 1047     Education Details balance/strengthening, HEP    Person(s) Educated Patient    Methods Explanation;Verbal cues    Comprehension Verbalized understanding;Returned demonstration;Verbal cues required;Need further instruction              PT Short Term Goals - 10/26/20 1520       PT SHORT TERM GOAL #1   Title Pt will be independent with HEP in order to improve strength and balance in order to decrease fall risk and improve function at home and work.    Baseline 09/27/2020- Patient has no formal HEP in place, 5/19: independent    Time 6    Period Weeks    Status Achieved    Target Date 11/08/20               PT Long Term Goals - 10/26/20 1521       PT LONG TERM GOAL #1    Title Pt will improve FOTO score from 61  to target score of 64 to display perceived improvements in ability to complete ADL's and functional mobility activities.    Baseline 09/27/2020: FOTO=61, 10/26/20: 64%    Time 12    Period Weeks    Status Achieved    Target Date 12/20/20  PT LONG TERM GOAL #2   Title Pt will improve DGI by at least 3 points in order to demonstrate clinically significant improvement in balance and decreased risk for falls.    Baseline 09/27/2020-DGI=18/24, 5/19: 18/24    Time 12    Period Weeks    Status Partially Met    Target Date 12/20/20      PT LONG TERM GOAL #3   Title Pt will improve ABC by at least 10 % in order to demonstrate clinically significant improvement in balance confidence.    Baseline 09/27/2020-68.1%, 5/19: 78%    Time 12    Period Weeks    Status Achieved    Target Date 12/20/20      PT LONG TERM GOAL #4   Title Pt will decrease 5TSTS by at least 5 seconds (initial =23 sec with no UE support) in order to demonstrate clinically significant improvement in LE strength.    Baseline 09/27/2020= 23 sec. with No UE support, 5/19: 15 sec    Time 12    Period Weeks    Status Achieved    Target Date 12/20/20      PT LONG TERM GOAL #5   Title Pt will decrease TUG to below 14 seconds/decrease in order to demonstrate decreased fall risk.    Baseline 09/27/2020- TUG score= 15.15 sec without UE support, 5/19: 8.89 sec    Time 12    Period Weeks    Status Achieved    Target Date 12/20/20      PT LONG TERM GOAL #6   Title Patient will increase six minute walk test distance to >1000 for progression to community ambulator and improve gait ability    Baseline 4/26: 695 ft with one seated rest break, 5/19: 975 feet    Time 12    Period Weeks    Status Partially Met    Target Date 12/20/20                   Plan - 11/28/20 1449     Clinical Impression Statement Patient motivated and participated well within session. She was  instructed in advanced LE strengthening and balancing tasks. patient continues to be short of breath throughout session. Vitals monitored. She was able to recover with short standing/seated rest breaks. Patient reports less swelling in LE following restart of diuretic. She does report her recent scans showed fluid around her heart which could be contributing to shortness of breath. Patient continues to have instability especially when standing on compliant surfaces. She would benefit from additional skilled PT intervention to improve strength, balance and mobility;    Personal Factors and Comorbidities Comorbidity 3+    Comorbidities HTN, MS, Arthritis, COPD    Examination-Activity Limitations Bend;Carry;Continence;Lift;Squat;Stairs;Stand    Examination-Participation Restrictions Community Activity;Yard Work    Stability/Clinical Decision Making Stable/Uncomplicated    Rehab Potential Good    PT Frequency 2x / week    PT Duration 12 weeks    PT Treatment/Interventions ADLs/Self Care Home Management;Cryotherapy;Moist Heat;DME Instruction;Gait training;Stair training;Functional mobility training;Therapeutic activities;Therapeutic exercise;Balance training;Neuromuscular re-education;Patient/family education;Manual techniques;Passive range of motion    PT Next Visit Plan Continue with progressive LE strengthening, dynamic balance training and functional endurance training. Continue to monitor vitals.    PT Home Exercise Plan no changes to HEP    Consulted and Agree with Plan of Care Patient             Patient will benefit from skilled therapeutic intervention in order to improve  the following deficits and impairments:  Abnormal gait, Cardiopulmonary status limiting activity, Decreased activity tolerance, Decreased balance, Decreased coordination, Decreased endurance, Decreased mobility, Decreased range of motion, Decreased strength, Difficulty walking, Impaired flexibility, Obesity, Pain  Visit  Diagnosis: Abnormality of gait and mobility  Difficulty in walking, not elsewhere classified  Other lack of coordination  Muscle weakness (generalized)     Problem List Patient Active Problem List   Diagnosis Date Noted   S/P ORIF (open reduction internal fixation) fracture 02/20/2018   DJD (degenerative joint disease) of knee 01/02/2017   Advanced care planning/counseling discussion 01/02/2017   Thrombocytopenia ([place]) 10/30/2015   Multiple sclerosis ([place])    Essential hypertension    Hyperlipidemia     Caige Almeda PT, DPT 11/28/2020, 2:55 PM  [place] MAIN [facility] SERVICES [address] [place], [place], [address] Phone: [phone]   Fax:  [phone]  Name: Leslie Smith MRN: [id number] Date of Birth: Nov 06, 1950

## 2020-11-30 ENCOUNTER — Other Ambulatory Visit: Payer: Self-pay

## 2020-11-30 ENCOUNTER — Ambulatory Visit: Payer: Medicare Other | Admitting: Physical Therapy

## 2020-11-30 ENCOUNTER — Encounter: Payer: Self-pay | Admitting: Physical Therapy

## 2020-11-30 DIAGNOSIS — M6281 Muscle weakness (generalized): Secondary | ICD-10-CM

## 2020-11-30 DIAGNOSIS — R269 Unspecified abnormalities of gait and mobility: Secondary | ICD-10-CM

## 2020-11-30 DIAGNOSIS — R278 Other lack of coordination: Secondary | ICD-10-CM

## 2020-11-30 DIAGNOSIS — R262 Difficulty in walking, not elsewhere classified: Secondary | ICD-10-CM

## 2020-11-30 NOTE — Therapy (Signed)
[place] MAIN [facility] SERVICES [address] [place], [place], [address] Phone: [phone]   Fax:  [phone]  Physical Therapy Treatment  Patient Details  Name: Leslie Smith MRN: [id number] Date of Birth: Feb 05, 1951 Referring Provider (PT): Dr. 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   Bladder incontinence    Cancer ([place]) 2014   brain tumor, resected   Cellulitis and abscess of leg 2019   2 episodes this year   Complication of anesthesia    DJD (degenerative joint disease) of knee 2018   Fall    Fatigue    History of blood transfusion    Hypertension    MS (multiple sclerosis) ([place])    Myelitis ([place])    Neuromuscular disorder ([place]) 2001   multiple sclerosis diagnosed by MRI   PONV (postoperative nausea and vomiting)    severe   Thrombocytopenia ([place])     Past Surgical History:  Procedure Laterality Date   ABDOMINAL HYSTERECTOMY  2011   required wound vac for 6 weeks. ovaries had grown attached to her back bone   BRAIN SURGERY  2015   tumor resection-denies seizures   BREAST SURGERY Left 2014   biopsy. negative   HARDWARE REMOVAL Left 05/05/2018   Procedure: HARDWARE REMOVAL- LEFT ANKLE;  Surgeon: Hessie Knows, MD;  Location: [facility];  Service: Orthopedics;  Laterality: Left;   I & D EXTREMITY Left 08/25/2015   Procedure: IRRIGATION AND DEBRIDEMENT THUMB;  Surgeon: Iran Planas, MD;  Location: [facility];  Service: Orthopedics;   Laterality: Left;   ORIF ANKLE FRACTURE Left 02/20/2018   Procedure: OPEN REDUCTION INTERNAL FIXATION (ORIF) ANKLE FRACTURE;  Surgeon: Hessie Knows, MD;  Location: [facility];  Service: Orthopedics;  Laterality: Left;    There were no vitals filed for this visit.   Subjective Assessment - 11/30/20 1352     Subjective Patient reports feeling a little better. She is still short of breath. She pressure washed her portch. She reports not getting over heated since she did it early.    Pertinent History Patient diagnosed with MS in 2001. Past medical history significant for HTN, MS, DJD right knee, Hyperlipidemia, Thrombocytopenia, S/p ORIF Left ankle fracture.    Limitations Lifting;Standing;Walking;House hold activities    How long can you sit comfortably? No restrictions    How long can you stand comfortably? 30 min maybe    How long can you walk comfortably? maybe 15 min    Patient Stated Goals Walk better and regain some lost strength.    Currently in Pain? No/denies    Pain Onset More than a month ago    Multiple Pain Sites No                 TREATMENT:  Warm up walking around gym x2 laps (approximately 140 feet),  fast/slow gait speed for challenge to dynamic balance with speed changes and to allow for breath recovery with increased cardiovascular demand of walking;   x2 sets;   Seated: LAQ with ankle DF x10 reps each LE with cues for increased ROM for better strengthening/flexibility;    After gait (1st set): BP  190/55  HR: 95, SpO2 97% After gait (2nd set) BP   220/49   HR 111, SPo2 96% After a short rest, 154/50 with minimal shortness of breath;   Standing in parallel bars: Stepping over orange hurdle: -forward/backward x10 reps without rail assist -side stepping x10 reps each direction without rail assist Required close supervision and min VCs to increase step length for better foot clearance;    NMR:  Standing with CGA next to support surface:  Standing on airex  pad: Feet together: eyes open 30 sec hold, eyes closed 15 sec hold x2 reps each;  -Heel raises unsupported 3 sec hold x15 reps; -one foot on airex pad and one foot on 4 inch step:             Ball pass side/side x10 reps each foot on step -modified tandem stance: head turns side/side x5 reps each unsupported with increased lateral unsteadiness noted requiring CGA for safety;  Patient required min VCs for balance stability, including to increase trunk control for less loss of balance with smaller base of support   Patient challenged today with progressive balance exercises on compliant surface, challenging ankle strategies and weight shift.  Patient instructed in gait with speed changes to challenge cardiovascular conditioning. She does exhibit increased systolic with advanced gait tasks requiring rest breaks; Recommend take rest breaks at home when feeling shortness of breath to allow vitals to remain WNL. Patient continues to have shortness of breath with prolonged standing activity, requiring short rest breaks.                      PT Education - 11/30/20 1353     Education Details balance/strengthening, HEP    Person(s) Educated Patient    Methods Explanation;Verbal cues    Comprehension Verbalized understanding;Returned demonstration;Verbal cues required;Need further instruction              PT Short Term Goals - 10/26/20 1520       PT SHORT TERM GOAL #1   Title Pt will be independent with HEP in order to improve strength and balance in order to decrease fall risk and improve function at home and work.    Baseline 09/27/2020- Patient has no formal HEP in place, 5/19: independent    Time 6    Period Weeks    Status Achieved    Target Date 11/08/20               PT Long Term Goals - 10/26/20 1521       PT LONG TERM GOAL #1   Title Pt will improve FOTO score from 61  to target score of 64 to display perceived improvements in ability to complete ADL's  and functional mobility activities.    Baseline 09/27/2020: FOTO=61, 10/26/20: 64%    Time 12    Period Weeks    Status Achieved    Target Date 12/20/20      PT LONG TERM GOAL #2   Title Pt will improve DGI by at least 3 points in order to demonstrate clinically significant improvement in balance and decreased risk for falls.    Baseline 09/27/2020-DGI=18/24, 5/19:  18/24    Time 12    Period Weeks    Status Partially Met    Target Date 12/20/20      PT LONG TERM GOAL #3   Title Pt will improve ABC by at least 10 % in order to demonstrate clinically significant improvement in balance confidence.    Baseline 09/27/2020-68.1%, 5/19: 78%    Time 12    Period Weeks    Status Achieved    Target Date 12/20/20      PT LONG TERM GOAL #4   Title Pt will decrease 5TSTS by at least 5 seconds (initial =23 sec with no UE support) in order to demonstrate clinically significant improvement in LE strength.    Baseline 09/27/2020= 23 sec. with No UE support, 5/19: 15 sec    Time 12    Period Weeks    Status Achieved    Target Date 12/20/20      PT LONG TERM GOAL #5   Title Pt will decrease TUG to below 14 seconds/decrease in order to demonstrate decreased fall risk.    Baseline 09/27/2020- TUG score= 15.15 sec without UE support, 5/19: 8.89 sec    Time 12    Period Weeks    Status Achieved    Target Date 12/20/20      PT LONG TERM GOAL #6   Title Patient will increase six minute walk test distance to >1000 for progression to community ambulator and improve gait ability    Baseline 4/26: 695 ft with one seated rest break, 5/19: 975 feet    Time 12    Period Weeks    Status Partially Met    Target Date 12/20/20                   Plan - 11/30/20 1521     Clinical Impression Statement Patient motivated and participated well within session. She was instructed in advanced balance tasks with increased difficulty noted on airex pad especially with narrow base of support. She required  intermittent rail assist/min A for stance control. Patient instructed in advanced gait tasks to challenge cardiovascular conditioning. She does report increased shortness of breath. Vitals monitored with significant increase in BP. Concerned about decreased conditioning and fluid around heart increasing systolic levels. Re-checked BP with short seated rest break which returned to safe levels. Patient would benefit from additional skilled PT Intervention to improve strength, balance and mobility;    Personal Factors and Comorbidities Comorbidity 3+    Comorbidities HTN, MS, Arthritis, COPD    Examination-Activity Limitations Bend;Carry;Continence;Lift;Squat;Stairs;Stand    Examination-Participation Restrictions Community Activity;Yard Work    Stability/Clinical Decision Making Stable/Uncomplicated    Rehab Potential Good    PT Frequency 2x / week    PT Duration 12 weeks    PT Treatment/Interventions ADLs/Self Care Home Management;Cryotherapy;Moist Heat;DME Instruction;Gait training;Stair training;Functional mobility training;Therapeutic activities;Therapeutic exercise;Balance training;Neuromuscular re-education;Patient/family education;Manual techniques;Passive range of motion    PT Next Visit Plan Continue with progressive LE strengthening, dynamic balance training and functional endurance training. Continue to monitor vitals.    PT Home Exercise Plan no changes to HEP    Consulted and Agree with Plan of Care Patient             Patient will benefit from skilled therapeutic intervention in order to improve the following deficits and impairments:  Abnormal gait, Cardiopulmonary status limiting activity, Decreased activity tolerance, Decreased balance, Decreased coordination, Decreased endurance, Decreased mobility, Decreased range of motion, Decreased strength, Difficulty walking, Impaired  flexibility, Obesity, Pain  Visit Diagnosis: Abnormality of gait and mobility  Difficulty in walking,  not elsewhere classified  Other lack of coordination  Muscle weakness (generalized)     Problem List Patient Active Problem List   Diagnosis Date Noted   S/P ORIF (open reduction internal fixation) fracture 02/20/2018   DJD (degenerative joint disease) of knee 01/02/2017   Advanced care planning/counseling discussion 01/02/2017   Thrombocytopenia ([place]) 10/30/2015   Multiple sclerosis ([place])    Essential hypertension    Hyperlipidemia     Deone Leifheit PT, DPT 11/30/2020, 3:23 PM  [place] MAIN [facility] SERVICES [address] [place], [place], [address] Phone: [phone]   Fax:  [phone]  Name: BRYSSA TONES MRN: [id number] Date of Birth: [birth year]

## 2020-12-05 ENCOUNTER — Ambulatory Visit: Payer: Medicare Other

## 2020-12-08 ENCOUNTER — Other Ambulatory Visit: Payer: Self-pay

## 2020-12-08 ENCOUNTER — Ambulatory Visit: Payer: Medicare Other | Attending: Neurology

## 2020-12-08 ENCOUNTER — Ambulatory Visit: Payer: Medicare Other

## 2020-12-08 DIAGNOSIS — R2681 Unsteadiness on feet: Secondary | ICD-10-CM | POA: Diagnosis present

## 2020-12-08 DIAGNOSIS — R269 Unspecified abnormalities of gait and mobility: Secondary | ICD-10-CM | POA: Diagnosis present

## 2020-12-08 DIAGNOSIS — M6281 Muscle weakness (generalized): Secondary | ICD-10-CM | POA: Diagnosis present

## 2020-12-08 DIAGNOSIS — R278 Other lack of coordination: Secondary | ICD-10-CM | POA: Diagnosis present

## 2020-12-08 DIAGNOSIS — R2689 Other abnormalities of gait and mobility: Secondary | ICD-10-CM | POA: Insufficient documentation

## 2020-12-08 DIAGNOSIS — R262 Difficulty in walking, not elsewhere classified: Secondary | ICD-10-CM | POA: Insufficient documentation

## 2020-12-08 NOTE — Therapy (Signed)
[place] MAIN [facility] SERVICES [address] [place], [place], [address] Phone: [phone]   Fax:  [phone]  Physical Therapy Treatment  Patient Details  Name: Leslie Smith MRN: [id number] Date of Birth: 08/17/1950 Referring Provider (PT): Dr. 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   Bladder incontinence    Cancer ([place]) 2014   brain tumor, resected   Cellulitis and abscess of leg 2019   2 episodes this year   Complication of anesthesia    DJD (degenerative joint disease) of knee 2018   Fall    Fatigue    History of blood transfusion    Hypertension    MS (multiple sclerosis) ([place])    Myelitis ([place])    Neuromuscular disorder ([place]) 2001   multiple sclerosis diagnosed by MRI   PONV (postoperative nausea and vomiting)    severe   Thrombocytopenia ([place])     Past Surgical History:  Procedure Laterality Date   ABDOMINAL HYSTERECTOMY  2011   required wound vac for 6 weeks. ovaries had grown attached to her back bone   BRAIN SURGERY  2015   tumor resection-denies seizures   BREAST SURGERY Left 2014   biopsy. negative   HARDWARE REMOVAL Left 05/05/2018   Procedure: HARDWARE REMOVAL- LEFT ANKLE;  Surgeon: Hessie Knows, MD;  Location: [facility];  Service: Orthopedics;  Laterality: Left;   I & D EXTREMITY Left 08/25/2015   Procedure: IRRIGATION AND DEBRIDEMENT THUMB;  Surgeon: Iran Planas, MD;  Location: [facility];  Service: Orthopedics;   Laterality: Left;   ORIF ANKLE FRACTURE Left 02/20/2018   Procedure: OPEN REDUCTION INTERNAL FIXATION (ORIF) ANKLE FRACTURE;  Surgeon: Hessie Knows, MD;  Location: [facility];  Service: Orthopedics;  Laterality: Left;    There were no vitals filed for this visit.   Subjective Assessment - 12/08/20 1009     Subjective Pt reports she is still feeling short of breath and that she has follow-up doctor's appointment next Thursday to address this problem.    Pertinent History Patient diagnosed with MS in 2001. Past medical history significant for HTN, MS, DJD right knee, Hyperlipidemia, Thrombocytopenia, S/p ORIF Left ankle fracture.    Limitations Lifting;Standing;Walking;House hold activities    How long can you sit comfortably? No restrictions    How long can you stand comfortably? 30 min maybe    How long can you walk comfortably? maybe 15 min    Patient Stated Goals Walk better and regain some lost strength.    Currently in Pain? No/denies    Pain Onset More than a month ago              TREATMENT  Gait for mm and cardiovasc endurance - x3 laps (1 lap = approx 138 ft), 2 rest breaks BP immediately post-gait: LUE, 223/75 mmHg HR: 106,  SPO2 97%, Borg RPE 5/10. Reports her doctor is aware regarding BP.  After a couple minutes rest: BP LUE was 148/61 mmHg HR 80 bpm, SPO2% 97%. No SOB.   PT instructs pt in vitals monitoring/findings, activity modification and sx to watch for (see assessment for details). Pt verbalizes understanding.   NMR:  Standing with CGA next to support surface:  PT instructs pt in the following through VC Standing on airex pad: Feet apart, EO - 2x60 sec Feet apart EC - 2x60 sec Feet together, EO 2x45 sec Feet together, EC 2x60 sec Most challenged with eyes closed BP assessment immediately post-balance exercise:  LUE 161/66 mmHg, HR 82 bpm, 1/10 RPE         PT Education - 12/08/20 1051     Education Details physical activity modifcation until doctor's  appointment to avoid significant increases in BP, signs and sx to watch out for and if they occur to seek emergency medical care.    Person(s) Educated Patient    Methods Explanation    Comprehension Verbalized understanding              PT Short Term Goals - 10/26/20 1520       PT SHORT TERM GOAL #1   Title Pt will be independent with HEP in order to improve strength and balance in order to decrease fall risk and improve function at home and work.    Baseline 09/27/2020- Patient has no formal HEP in place, 5/19: independent    Time 6    Period Weeks    Status Achieved    Target Date 11/08/20               PT Long Term Goals - 10/26/20 1521       PT LONG TERM GOAL #1   Title Pt will improve FOTO score from 61  to target score of 64 to display perceived improvements in ability to complete ADL's and functional mobility activities.    Baseline 09/27/2020: FOTO=61, 10/26/20: 64%    Time 12    Period Weeks    Status Achieved    Target Date 12/20/20      PT LONG TERM GOAL #2   Title Pt will improve DGI by at least 3 points in order to demonstrate clinically significant improvement in balance and decreased risk for falls.    Baseline 09/27/2020-DGI=18/24, 5/19: 18/24    Time 12    Period Weeks    Status Partially Met    Target Date 12/20/20      PT LONG TERM GOAL #3   Title Pt will improve ABC by at least 10 % in order to demonstrate clinically significant improvement in balance confidence.    Baseline 09/27/2020-68.1%, 5/19: 78%    Time 12    Period Weeks    Status Achieved    Target Date 12/20/20      PT LONG TERM GOAL #4   Title Pt will decrease 5TSTS by at least 5 seconds (initial =23 sec with no UE support) in order to demonstrate clinically significant improvement in LE strength.    Baseline 09/27/2020= 23 sec. with No UE support, 5/19: 15 sec    Time 12    Period Weeks    Status Achieved    Target Date 12/20/20      PT LONG TERM GOAL #5   Title Pt will  decrease TUG to below 14 seconds/decrease in order to demonstrate decreased fall risk.    Baseline 09/27/2020-  TUG score= 15.15 sec without UE support, 5/19: 8.89 sec    Time 12    Period Weeks    Status Achieved    Target Date 12/20/20      PT LONG TERM GOAL #6   Title Patient will increase six minute walk test distance to >1000 for progression to community ambulator and improve gait ability    Baseline 4/26: 695 ft with one seated rest break, 5/19: 975 feet    Time 12    Period Weeks    Status Partially Met    Target Date 12/20/20                   Plan - 12/08/20 1054     Clinical Impression Statement Session limited today secondary to pt BP with exercise. With endurance/gait exercise (3 laps around clinic) pt Borg RPE was 5/10 and BP immediately post-gait was 223/75 mmHg (LUE). With a couple minutes of rest pt BP decreased to 148/61 mmHg and pt reported no sx.  Immediately following standing balance exercises pt BP was 161/66 mmHg and RPE was 1/10. Exercises discontinued d/t pt BP increases. PT instructed pt to seek emergency care should she also experience dizziness, HA, pain, confusion with SOB. Pt verbalized understanding. Pt reports her doctors are aware she has been feeling SOB and that she has follow-up next Thursday with her doctor to address this issue. PT provided pt handout with BP numbers with gait exercise. PT also instructed pt to contact her doctor regarding BP with exertion and for pt to take it easy until she is able to see her doctor/modify physical activity to avoid increased SOB and increased BP. Pt verbalized understanding. Another PT escorted pt to her car at end of session. The pt will benefit from further skilled PT to improve strength, balance and mobility to improve QOL.    Personal Factors and Comorbidities Comorbidity 3+    Comorbidities HTN, MS, Arthritis, COPD    Examination-Activity Limitations Bend;Carry;Continence;Lift;Squat;Stairs;Stand     Examination-Participation Restrictions Community Activity;Yard Work    Stability/Clinical Decision Making Stable/Uncomplicated    Rehab Potential Good    PT Frequency 2x / week    PT Duration 12 weeks    PT Treatment/Interventions ADLs/Self Care Home Management;Cryotherapy;Moist Heat;DME Instruction;Gait training;Stair training;Functional mobility training;Therapeutic activities;Therapeutic exercise;Balance training;Neuromuscular re-education;Patient/family education;Manual techniques;Passive range of motion    PT Next Visit Plan Continue with progressive LE strengthening, dynamic balance training and functional endurance training. Continue to monitor vitals.    PT Home Exercise Plan no changes to HEP    Consulted and Agree with Plan of Care Patient             Patient will benefit from skilled therapeutic intervention in order to improve the following deficits and impairments:  Abnormal gait, Cardiopulmonary status limiting activity, Decreased activity tolerance, Decreased balance, Decreased coordination, Decreased endurance, Decreased mobility, Decreased range of motion, Decreased strength, Difficulty walking, Impaired flexibility, Obesity, Pain  Visit Diagnosis: Muscle weakness (generalized)  Other abnormalities of gait and mobility  Unsteadiness on feet     Problem List Patient Active Problem List   Diagnosis Date Noted   S/P ORIF (open reduction internal fixation) fracture 02/20/2018   DJD (degenerative joint disease) of knee 01/02/2017   Advanced care planning/counseling discussion 01/02/2017   Thrombocytopenia ([place]) 10/30/2015   Multiple sclerosis ([place])    Essential hypertension    Hyperlipidemia    Ricard Dillon PT, DPT 12/08/2020, 11:11 AM  [place] MAIN  [place], [place], [address] Phone: [phone]   Fax:  [phone]  Name: Leslie Smith MRN: [id number] Date of Birth: July 27, 1950

## 2020-12-15 ENCOUNTER — Other Ambulatory Visit: Payer: Self-pay

## 2020-12-15 ENCOUNTER — Ambulatory Visit
Admission: RE | Admit: 2020-12-15 | Discharge: 2020-12-15 | Disposition: A | Discharge status: Home / Self Care | Payer: Medicare Other | Source: Ambulatory Visit | Attending: Internal Medicine | Admitting: Internal Medicine

## 2020-12-15 DIAGNOSIS — I10 Essential (primary) hypertension: Secondary | ICD-10-CM | POA: Diagnosis present

## 2020-12-15 DIAGNOSIS — N179 Acute kidney failure, unspecified: Secondary | ICD-10-CM | POA: Insufficient documentation

## 2020-12-15 DIAGNOSIS — R1084 Generalized abdominal pain: Secondary | ICD-10-CM | POA: Insufficient documentation

## 2020-12-19 ENCOUNTER — Telehealth: Payer: Self-pay

## 2020-12-19 ENCOUNTER — Ambulatory Visit: Payer: Medicare Other

## 2020-12-19 ENCOUNTER — Other Ambulatory Visit: Payer: Self-pay

## 2020-12-19 VITALS — BP 203/67 | HR 72

## 2020-12-19 DIAGNOSIS — R2681 Unsteadiness on feet: Secondary | ICD-10-CM

## 2020-12-19 DIAGNOSIS — R262 Difficulty in walking, not elsewhere classified: Secondary | ICD-10-CM

## 2020-12-19 DIAGNOSIS — M6281 Muscle weakness (generalized): Secondary | ICD-10-CM

## 2020-12-19 DIAGNOSIS — R269 Unspecified abnormalities of gait and mobility: Secondary | ICD-10-CM

## 2020-12-19 DIAGNOSIS — R2689 Other abnormalities of gait and mobility: Secondary | ICD-10-CM

## 2020-12-19 DIAGNOSIS — R278 Other lack of coordination: Secondary | ICD-10-CM

## 2020-12-19 NOTE — Telephone Encounter (Signed)
Pt still ongoing HTN medication changes, still not back to baseline dose HCTZ due to PCP concerns of kidney function. This date SBP: 238mmHg after 336ft AMB, last physical therapy session SBP: 245mmHg, also after walking. Author contacts Dr. Doy Hutching office (PCP) to make aware of significant SBP elevation with household distance AMB. Spoke to [employer] who is covering for Dr. Johny Shock today (1045am). Hassan Rowan indicated that she would review with Dr. Doy Hutching and contact the patient with any specific action.   11:01 AM, 12/19/20 Etta Grandchild, PT, DPT Physical Therapist - [place] [phone]

## 2020-12-19 NOTE — Therapy (Signed)
[place] MAIN [facility] SERVICES [address] [place], [place], [address] Phone: [phone]   Fax:  [phone]  Physical Therapy Re-Reassessment/Recertifcation   Patient Details  Name: Leslie Smith MRN: [id number] Date of Birth: 1951/01/11 Referring Provider (PT): Dr. 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   Bladder incontinence    Cancer ([place]) 2014   brain tumor, resected   Cellulitis and abscess of leg 2019   2 episodes this year   Complication of anesthesia    DJD (degenerative joint disease) of knee 2018   Fall    Fatigue    History of blood transfusion    Hypertension    MS (multiple sclerosis) ([place])    Myelitis ([place])    Neuromuscular disorder ([place]) 2001   multiple sclerosis diagnosed by MRI   PONV (postoperative nausea and vomiting)    severe   Thrombocytopenia ([place])     Past Surgical History:  Procedure Laterality Date   ABDOMINAL HYSTERECTOMY  2011   required wound vac for 6 weeks. ovaries had grown attached to her back bone   BRAIN SURGERY  2015   tumor resection-denies seizures   BREAST SURGERY Left 2014   biopsy. negative   HARDWARE REMOVAL Left 05/05/2018   Procedure: HARDWARE REMOVAL- LEFT ANKLE;  Surgeon: Hessie Knows, MD;  Location: [facility];  Service: Orthopedics;  Laterality: Left;   I & D EXTREMITY Left 08/25/2015   Procedure: IRRIGATION AND DEBRIDEMENT THUMB;  Surgeon: Iran Planas, MD;  Location: [place];  Service: Orthopedics;  Laterality: Left;   ORIF ANKLE FRACTURE  Left 02/20/2018   Procedure: OPEN REDUCTION INTERNAL FIXATION (ORIF) ANKLE FRACTURE;  Surgeon: Hessie Knows, MD;  Location: [facility];  Service: Orthopedics;  Laterality: Left;    Vitals:   12/19/20 1012  BP: (!) 203/67  Pulse: 72  SpO2: 99%  (Post AMB, see below for vitals on arrival)    Subjective Assessment - 12/19/20 0939     Subjective Pt still trying to get her HTN undercontrol. PCP has been gradually titrating HTZ back up to baseline does, but conservatively so due to acutely impaired renal function (per notes). Pt unable to specificy which HTZ dose she is currently on. 184/21mmHg last night taken at home. Pt still feels like she is retaining fluid, but never told to weight self daily. Pain is baseline. Pt still has acutely increased DOE with AMB actiivty.    Pertinent History Patient diagnosed with MS in 2001. Past medical history significant for HTN, MS, DJD right knee, Hyperlipidemia, Thrombocytopenia, S/p ORIF Left ankle fracture.    How long can you sit comfortably? No restrictions    How long can you stand comfortably? No baseline ADL/IADL activity limited by standing tolerance    How long can you walk comfortably? today is limited by her walk to mailbox; maybe 15 min at evaluation    Currently in Pain? --   has  her baseline pain, but nothing acute or concerning               [facility] PT Assessment - 12/19/20 0001       Observation/Other Assessments   Focus on Therapeutic Outcomes (FOTO)  58   61 at 4/20 eval; 64 at 5/19 visit     ROM / Strength   AROM / PROM / Strength Strength      Strength   Strength Assessment Site Hip;Knee;Ankle    Right/Left Hip Right;Left    Right Hip Flexion 5/5   3+ at eval   Right Hip Extension 4/5   3+ at eval   Right Hip External Rotation  5/5   3+ at eval   Right Hip Internal Rotation 5/5   3+ at eval   Right Hip ABduction 4+/5   3+ at eval   Right Hip ADduction 5/5   3+ at eval   Left Hip Flexion 5/5   3+ at eval   Left Hip  Extension 4+/5   3+ at eval   Left Hip External Rotation 5/5   3+ at eval   Left Hip Internal Rotation 5/5   3+ at eval   Left Hip ABduction 4+/5   3+ at eval   Left Hip ADduction 5/5   3+ at eval   Right/Left Knee Right;Left    Right Knee Flexion 5/5   4/5 at eval   Right Knee Extension 5/5   4/5 at eval   Left Knee Flexion 5/5   4/5 at eval   Left Knee Extension 5/5   4/5 at eval   Right/Left Ankle Right;Left    Right Ankle Dorsiflexion 5/5   4/5 at eval   Right Ankle Plantar Flexion --   4/5 at eval, not retested today   Left Ankle Dorsiflexion 5/5   4/5 at eval   Left Ankle Plantar Flexion --   4/5 at eval, not retested today     Ambulation/Gait   Ambulation Distance (Feet) 320 Feet    Assistive device None    Gait Pattern Wide base of support   increased stance time, antalgia on RLE ("bad knee")   Gait velocity 0.69m/s    Gait Comments Stopped to check vitals; DOE at 2-3/4. SpO2 delayed due to equipment error, eventually 99% on Room air   SBP >273mmHG after AMB.     6 Minute Walk- Baseline   6 Minute Walk- Baseline --   *see vitals section (taken after AMB)   BP (mmHg) 152/56    HR (bpm) 71      6 Minute walk- Post Test   BP (mmHg) 203/67    HR (bpm) 72    02 Sat (%RA) 99 %                  Objective measurements completed on examination: See above findings.               PT Education - 12/19/20 1027     Education Details BP is too high with AMB activity, would not recommend AMB more than needed for ADL/IADL at this time; will contact MD.    Person(s) Educated Patient    Methods Explanation    Comprehension Verbalized understanding              PT Short Term Goals - 12/19/20 1031       PT SHORT TERM GOAL #1   Title Pt will be independent with HEP in order  to improve strength and balance in order to decrease fall risk and improve function at home and work.    Baseline 09/27/2020- Patient has no formal HEP in place, 5/19: independent; 7/12:  not performing, handout is lost    Time 6    Period Weeks    Status On-going    Target Date 01/16/21               PT Long Term Goals - 12/19/20 1031       PT LONG TERM GOAL #1   Title Pt will improve FOTO score from 61  to target score of 64 to display perceived improvements in ability to complete ADL's and functional mobility activities.    Baseline 09/27/2020: FOTO=61, 10/26/20: 64%; 7/12: 58    Time 8    Period Weeks    Status On-going    Target Date 02/13/21      PT LONG TERM GOAL #2   Title Pt will improve DGI by at least 3 points in order to demonstrate clinically significant improvement in balance and decreased risk for falls.    Baseline 09/27/2020-DGI=18/24, 5/19: 18/24 7/12: not tested    Time 8    Period Weeks    Status On-going    Target Date 02/13/21      PT LONG TERM GOAL #3   Title Pt will improve ABC by at least 10 % in order to demonstrate clinically significant improvement in balance confidence.    Baseline 09/27/2020-68.1%, 5/19: 78% 7/12: not assessed    Time 8    Period Weeks    Status Achieved    Target Date 02/13/21      PT LONG TERM GOAL #4   Title Pt will decrease 5TSTS by at least 5 seconds (initial =23 sec with no UE support) in order to demonstrate clinically significant improvement in LE strength.    Baseline 09/27/2020= 23 sec. with No UE support, 5/19: 15 sec; 7/12: not assessed    Time 8    Period Weeks    Status On-going    Target Date 02/13/21      PT LONG TERM GOAL #5   Title Pt will decrease TUG to below 14 seconds/decrease in order to demonstrate decreased fall risk.    Baseline 09/27/2020- TUG score= 15.15 sec without UE support, 5/19: 8.89 sec; 12/19/20: not assessed    Time 8    Period Weeks    Status On-going      PT LONG TERM GOAL #6   Title Patient will increase six minute walk test distance to >1000 for progression to community ambulator and improve gait ability    Baseline 4/26: 695 ft with one seated rest break, 5/19:  975 feet; 7/12: not safe to attempt, BP too high    Time 8    Period Weeks    Status On-going    Target Date 02/13/21                    Plan - 12/19/20 1029     Clinical Impression Statement Pt coming up to end of certiifcation period with insurance, partial reassesment performed this date. Pt had shown good progress and completion towward many of LT goals a fews prior, but has since been set back by medication changes with PCP and difficulty controlling her HTN, since limited by SOB, and missing several PT appointments. This date testing suggests improved leg strength AEB MMT, 5/5 in many groups now. Pt had shown improved  FOTO survery score, but this date FOTO reflects her recent setbacks. Pt AMB 347ft this date prior to onset worsening DOE, vitals assessment reveals a post AMB BP that is too high to permit safe additional AMB actiivty in session. Medications are still being adjusted with PCP, will plan on recertification with insurance for 8 more weeks to regain recently lost gains for return to baseline strength, safety, actiivty tolerance, and independence in basic mobility required for ADL/IADL mobility.    Personal Factors and Comorbidities Comorbidity 3+    Comorbidities HTN, MS, Arthritis, COPD    Examination-Activity Limitations Bend;Carry;Continence;Lift;Squat;Stairs;Stand    Examination-Participation Restrictions Community Activity;Yard Work    [employer] officer Stable/Uncomplicated    Civil Service fast streamer Potential Poor    PT Frequency 2x / week    PT Duration 8 weeks    PT Treatment/Interventions ADLs/Self Care Home Management;Cryotherapy;Moist Heat;DME Instruction;Gait training;Stair training;Functional mobility training;Therapeutic activities;Therapeutic exercise;Balance training;Neuromuscular re-education;Patient/family education;Manual techniques;Passive range of motion    PT Next Visit Plan Limit AMB activity if BP conitnues to go  to unsafe HTN (>283TDVV systolic); review HEP for home including balance prn    PT Home Exercise Plan pt unaware of location of her original handout, has not been compliant, warrants review (12/19/20)    Consulted and Agree with Plan of Care Patient             Patient will benefit from skilled therapeutic intervention in order to improve the following deficits and impairments:  Abnormal gait, Cardiopulmonary status limiting activity, Decreased activity tolerance, Decreased balance, Decreased coordination, Decreased endurance, Decreased mobility, Decreased range of motion, Decreased strength, Difficulty walking, Impaired flexibility, Obesity, Pain  Visit Diagnosis: Muscle weakness (generalized) - Plan: PT plan of care cert/re-cert  Other abnormalities of gait and mobility - Plan: PT plan of care cert/re-cert  Unsteadiness on feet - Plan: PT plan of care cert/re-cert  Abnormality of gait and mobility - Plan: PT plan of care cert/re-cert  Difficulty in walking, not elsewhere classified - Plan: PT plan of care cert/re-cert  Other lack of coordination - Plan: PT plan of care cert/re-cert     Problem List Patient Active Problem List   Diagnosis Date Noted   S/P ORIF (open reduction internal fixation) fracture 02/20/2018   DJD (degenerative joint disease) of knee 01/02/2017   Advanced care planning/counseling discussion 01/02/2017   Thrombocytopenia ([place]) 10/30/2015   Multiple sclerosis ([place])    Essential hypertension    Hyperlipidemia    10:56 AM, 12/19/20 Etta Grandchild, PT, DPT Physical Therapist - [place] [phone]     Etta Grandchild 12/19/2020, 10:55 AM  [facility] [address] [place], [place], [address] Phone: [phone]   Fax:  [phone]  Name: Leslie Smith MRN: [id number] Date of Birth: 24-Nov-1950

## 2020-12-21 ENCOUNTER — Ambulatory Visit
Admission: RE | Admit: 2020-12-21 | Discharge: 2020-12-21 | Disposition: A | Discharge status: Home / Self Care | Payer: Medicare Other | Source: Ambulatory Visit | Attending: Physician Assistant | Admitting: Physician Assistant

## 2020-12-21 ENCOUNTER — Other Ambulatory Visit: Payer: Self-pay | Admitting: Physician Assistant

## 2020-12-21 ENCOUNTER — Other Ambulatory Visit: Payer: Self-pay

## 2020-12-21 DIAGNOSIS — R7989 Other specified abnormal findings of blood chemistry: Secondary | ICD-10-CM | POA: Insufficient documentation

## 2020-12-21 DIAGNOSIS — R0609 Other forms of dyspnea: Secondary | ICD-10-CM

## 2020-12-21 DIAGNOSIS — R0600 Dyspnea, unspecified: Secondary | ICD-10-CM | POA: Insufficient documentation

## 2020-12-21 MED ORDER — IOHEXOL 350 MG/ML SOLN
75.0000 mL | Freq: Once | INTRAVENOUS | Status: AC | PRN
  Administered 2020-12-21: 75 mL via INTRAVENOUS

## 2020-12-22 ENCOUNTER — Ambulatory Visit: Payer: Medicare Other

## 2020-12-22 DIAGNOSIS — R2681 Unsteadiness on feet: Secondary | ICD-10-CM

## 2020-12-22 DIAGNOSIS — M6281 Muscle weakness (generalized): Secondary | ICD-10-CM | POA: Diagnosis not present

## 2020-12-22 DIAGNOSIS — R2689 Other abnormalities of gait and mobility: Secondary | ICD-10-CM

## 2020-12-22 NOTE — Therapy (Signed)
[place] MAIN [facility] SERVICES [address] [place], [place], [address] Phone: [phone]   Fax:  [phone]  Physical Therapy Treatment  Patient Details  Name: Leslie Smith MRN: [id number] Date of Birth: Nov 27, 1950 Referring Provider (PT): Dr. Darlin Priestly   Encounter Date: 12/22/2020   PT End of Session - 12/22/20 1056     Visit Number 18    Number of Visits 25    Date for PT Re-Evaluation 12/20/20    Authorization Type Medicare A&B; Medicaid 2nd; VL review after 25 visits    Authorization Time Period 6/96/29-10/10/82; previous cert 1/32/4401-0/27/2536    Authorization - Visit Number 50    Authorization - Number of Visits 25    PT Start Time 6440    PT Stop Time 0941    PT Time Calculation (min) 47 min    Equipment Utilized During Treatment Gait belt    Activity Tolerance Patient tolerated treatment well    Behavior During Therapy WFL for tasks assessed/performed             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   Bladder incontinence    Cancer ([place]) 2014   brain tumor, resected   Cellulitis and abscess of leg 2019   2 episodes this year   Complication of anesthesia    DJD (degenerative joint disease) of knee 2018   Fall    Fatigue    History of blood transfusion    Hypertension    MS (multiple sclerosis) ([place])    Myelitis ([place])    Neuromuscular disorder ([place]) 2001   multiple sclerosis diagnosed by MRI   PONV (postoperative nausea and vomiting)    severe   Thrombocytopenia ([place])     Past Surgical History:  Procedure Laterality Date   ABDOMINAL HYSTERECTOMY  2011   required wound vac for 6 weeks. ovaries had grown attached to her back bone   BRAIN SURGERY  2015   tumor resection-denies seizures   BREAST SURGERY Left 2014   biopsy. negative   HARDWARE REMOVAL Left 05/05/2018   Procedure: HARDWARE REMOVAL- LEFT ANKLE;  Surgeon: Hessie Knows, MD;  Location: [facility];  Service:  Orthopedics;  Laterality: Left;   I & D EXTREMITY Left 08/25/2015   Procedure: IRRIGATION AND DEBRIDEMENT THUMB;  Surgeon: Iran Planas, MD;  Location: [place];  Service: Orthopedics;  Laterality: Left;   ORIF ANKLE FRACTURE Left 02/20/2018   Procedure: OPEN REDUCTION INTERNAL FIXATION (ORIF) ANKLE FRACTURE;  Surgeon: Hessie Knows, MD;  Location: [facility];  Service: Orthopedics;  Laterality: Left;    There were no vitals filed for this visit.   Subjective Assessment - 12/22/20 0854     Subjective Pt reports appointment yesterday for CT scan. She says she's waiting to talk to her doctor regarding results. Pt reports knee and lower back pain. She rates her pain as 6/10. She says she has felt somewhat better lately with her DOE sx, and that she is taking her BP medication more consistently.    Pertinent History Patient diagnosed with MS in 2001. Past medical history significant for HTN, MS, DJD right knee, Hyperlipidemia, Thrombocytopenia, S/p ORIF Left ankle fracture.    How long can you sit comfortably? No restrictions    How long can you stand comfortably? No baseline ADL/IADL activity limited by standing tolerance    How long can you walk comfortably? today is limited by her walk to mailbox; maybe 15 min  at evaluation    Currently in Pain? Yes    Pain Score 6     Pain Location Back    Pain Orientation Lower    Pain Descriptors / Indicators Aching              TREATMENT  Pre-exercise BP: LUE: 153/67 mmHg, HR 69 bpm  TUG: 9.8 seconds; no DOE Rest break  5xSTS: 17.68 sec (slight decrease), no sx  Rest break  ABC Scale: 61.25  DGI: 22/24; greatest difficulty where pt lost points was difficulty with handrail. BP taken half-way through DGI: LUE  161/54 mmHg, HR 72 bpm. Rest break   NMR:  Standing with CGA next to support surface:  PT instructs pt in the following through VC Standing on airex pad: Feet apart, EO - 2x30 sec Feet apart EC - 2x30 sec Feet together, EO 2x3  sec Feet together, EC 2x30 sec Most challenged with eyes closed  Pt reports no shortness of breath at end of session.   PT Short Term Goals - 12/19/20 1031       PT SHORT TERM GOAL #1   Title Pt will be independent with HEP in order to improve strength and balance in order to decrease fall risk and improve function at home and work.    Baseline 09/27/2020- Patient has no formal HEP in place, 5/19: independent; 7/12: not performing, handout is lost    Time 6    Period Weeks    Status On-going    Target Date 01/16/21               PT Long Term Goals - 12/22/20 0905       PT LONG TERM GOAL #1   Title Pt will improve FOTO score from 61  to target score of 64 to display perceived improvements in ability to complete ADL's and functional mobility activities.    Baseline 09/27/2020: FOTO=61, 10/26/20: 64%; 7/12: 58    Time 8    Period Weeks    Status On-going    Target Date 02/13/21      PT LONG TERM GOAL #2   Title Pt will improve DGI by at least 3 points in order to demonstrate clinically significant improvement in balance and decreased risk for falls.    Baseline 09/27/2020-DGI=18/24, 5/19: 18/24 7/12: not tested; 7/15: 22/24    Time 8    Period Weeks    Status Achieved    Target Date 02/13/21      PT LONG TERM GOAL #3   Title Pt will improve ABC by at least 10 % in order to demonstrate clinically significant improvement in balance confidence.    Baseline 09/27/2020-68.1%, 5/19: 78% 7/12: not assessed; 7/15: 61.25    Time 8    Period Weeks    Status Achieved    Target Date 02/13/21      PT LONG TERM GOAL #4   Title Pt will decrease 5TSTS by at least 5 seconds (initial =23 sec with no UE support) in order to demonstrate clinically significant improvement in LE strength.    Baseline 09/27/2020= 23 sec. with No UE support, 5/19: 15 sec; 7/12: not assessed; 7/15: 17.68 sec    Time 8    Period Weeks    Status On-going    Target Date 02/13/21      PT LONG TERM GOAL #5    Title Pt will decrease TUG to below 14 seconds/decrease in order to demonstrate decreased fall risk.  Baseline 09/27/2020- TUG score= 15.15 sec without UE support, 5/19: 8.89 sec; 12/19/20: not assessed; 7/15: 9.8 sec    Time 8    Period Weeks    Status Achieved      PT LONG TERM GOAL #6   Title Patient will increase six minute walk test distance to >1000 for progression to community ambulator and improve gait ability    Baseline 4/26: 695 ft with one seated rest break, 5/19: 975 feet; 7/12: not safe to attempt, BP too high    Time 8    Period Weeks    Status On-going    Target Date 02/13/21                   Plan - 12/22/20 1057     Clinical Impression Statement Pt improved activity tolerance today with no DOE reported during any testing or interventions. Pt BP response also indicates improvement with activity. Prior to interventions BP was 153/67 mmHg, and with interventions and testing BP was 161/54 mmHg with no sx. Pt does report improved consistency with BP medication schedule. She has achieved her DGI goal as well as TUG goal, indicating improved balance and decreased fall risk. She saw slight decrease on 5xSTS and ABC scale from previous assessments. The pt will continue to benefit from further skilled PT to continue to improve strength, balance, endurance and to decrease fall risk.    Personal Factors and Comorbidities Comorbidity 3+    Comorbidities HTN, MS, Arthritis, COPD    Examination-Activity Limitations Bend;Carry;Continence;Lift;Squat;Stairs;Stand    Examination-Participation Restrictions Community Activity;Yard Work    Stability/Clinical Decision Making Stable/Uncomplicated    Rehab Potential Poor    PT Frequency 2x / week    PT Duration 8 weeks    PT Treatment/Interventions ADLs/Self Care Home Management;Cryotherapy;Moist Heat;DME Instruction;Gait training;Stair training;Functional mobility training;Therapeutic activities;Therapeutic exercise;Balance  training;Neuromuscular re-education;Patient/family education;Manual techniques;Passive range of motion    PT Next Visit Plan Limit AMB activity if BP conitnues to go to unsafe HTN (>641RAXE systolic); review HEP for home including balance prn    PT Home Exercise Plan pt unaware of location of her original handout, has not been compliant, warrants review (12/19/20)    Consulted and Agree with Plan of Care Patient             Patient will benefit from skilled therapeutic intervention in order to improve the following deficits and impairments:  Abnormal gait, Cardiopulmonary status limiting activity, Decreased activity tolerance, Decreased balance, Decreased coordination, Decreased endurance, Decreased mobility, Decreased range of motion, Decreased strength, Difficulty walking, Impaired flexibility, Obesity, Pain  Visit Diagnosis: Other abnormalities of gait and mobility  Muscle weakness (generalized)  Unsteadiness on feet     Problem List Patient Active Problem List   Diagnosis Date Noted   S/P ORIF (open reduction internal fixation) fracture 02/20/2018   DJD (degenerative joint disease) of knee 01/02/2017   Advanced care planning/counseling discussion 01/02/2017   Thrombocytopenia ([place]) 10/30/2015   Multiple sclerosis ([place])    Essential hypertension    Hyperlipidemia    Ricard Dillon PT, DPT 12/22/2020, 11:02 AM  [place] MAIN [facility] SERVICES [address] [place], [place], [address] Phone: [phone]   Fax:  [phone]  Name: Leslie Smith MRN: [id number] Date of Birth: 12-08-1950

## 2020-12-26 ENCOUNTER — Ambulatory Visit: Payer: Medicare Other

## 2020-12-28 ENCOUNTER — Ambulatory Visit: Payer: Medicare Other

## 2021-01-02 ENCOUNTER — Ambulatory Visit: Payer: Medicare Other | Admitting: Physical Therapy

## 2021-01-02 ENCOUNTER — Other Ambulatory Visit: Payer: Self-pay

## 2021-01-02 DIAGNOSIS — M6281 Muscle weakness (generalized): Secondary | ICD-10-CM

## 2021-01-02 DIAGNOSIS — R2681 Unsteadiness on feet: Secondary | ICD-10-CM

## 2021-01-02 DIAGNOSIS — R269 Unspecified abnormalities of gait and mobility: Secondary | ICD-10-CM

## 2021-01-02 DIAGNOSIS — R2689 Other abnormalities of gait and mobility: Secondary | ICD-10-CM

## 2021-01-02 DIAGNOSIS — R262 Difficulty in walking, not elsewhere classified: Secondary | ICD-10-CM

## 2021-01-02 DIAGNOSIS — R278 Other lack of coordination: Secondary | ICD-10-CM

## 2021-01-02 NOTE — Therapy (Addendum)
[place] MAIN [facility] SERVICES [address] [place], [place], [address] Phone: [phone]   Fax:  [phone]  Physical Therapy Treatment  Patient Details  Name: Leslie Smith MRN: [id number] Date of Birth: 11-09-1950 Referring Provider (PT): Dr. Darlin Priestly   Encounter Date: 01/02/2021   PT End of Session - 01/02/21 1216     Visit Number 19    Number of Visits 25    Date for PT Re-Evaluation 03/13/21    Authorization Type Medicare A&B; Medicaid 2nd; VL review after 25 visits    Authorization Time Period [id number]; previous cert [id number]    Authorization - Visit Number 55    Authorization - Number of Visits 25    PT Start Time 0850    PT Stop Time 0934    PT Time Calculation (min) 44 min    Equipment Utilized During Treatment Gait belt    Activity Tolerance Patient tolerated treatment well    Behavior During Therapy WFL for tasks assessed/performed             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   Bladder incontinence    Cancer ([place]) 2014   brain tumor, resected   Cellulitis and abscess of leg 2019   2 episodes this year   Complication of anesthesia    DJD (degenerative joint disease) of knee 2018   Fall    Fatigue    History of blood transfusion    Hypertension    MS (multiple sclerosis) ([place])    Myelitis ([place])    Neuromuscular disorder ([place]) 2001   multiple sclerosis diagnosed by MRI   PONV (postoperative nausea and vomiting)    severe   Thrombocytopenia ([place])     Past Surgical History:  Procedure Laterality Date   ABDOMINAL HYSTERECTOMY  2011   required wound vac for 6 weeks. ovaries had grown attached to her back bone   BRAIN SURGERY  2015   tumor resection-denies seizures   BREAST SURGERY Left 2014   biopsy. negative   HARDWARE REMOVAL Left 05/05/2018   Procedure: HARDWARE REMOVAL- LEFT ANKLE;  Surgeon: Hessie Knows, MD;  Location: [facility];  Service:  Orthopedics;  Laterality: Left;   I & D EXTREMITY Left 08/25/2015   Procedure: IRRIGATION AND DEBRIDEMENT THUMB;  Surgeon: Iran Planas, MD;  Location: [place];  Service: Orthopedics;  Laterality: Left;   ORIF ANKLE FRACTURE Left 02/20/2018   Procedure: OPEN REDUCTION INTERNAL FIXATION (ORIF) ANKLE FRACTURE;  Surgeon: Hessie Knows, MD;  Location: [facility];  Service: Orthopedics;  Laterality: Left;    There were no vitals filed for this visit.   Subjective Assessment - 01/02/21 0856     Subjective Pt states she is well today. She had a good day yesterday and so far today. Pt reports knee and lower back pain. She rates her pain as "usual" 6/10.    Pertinent History Patient diagnosed with MS in 2001. Past medical history significant for HTN, MS, DJD right knee, Hyperlipidemia, Thrombocytopenia, S/p ORIF Left ankle fracture.    How long can you sit comfortably? No restrictions    How long can you stand comfortably? No baseline ADL/IADL activity limited by standing tolerance    How long can you walk comfortably? today is limited by her walk to mailbox; maybe 15 min at evaluation    Currently in Pain? Yes    Pain Score 6     Pain Location Back  Pain Orientation Lower    Pain Descriptors / Indicators Aching    Pain Type Chronic pain               TREATMENT   Pre-session BP LUE: 153/54, HR 71 Gait for mm and cardiovasc endurance - 2 laps x2 reps (1 lap = approx 138 ft), seated rest break after each rep; BP immediately post-gait: LUE, 217/72 mmHg HR: 110, SpO2 = 95, Borg RPE 8/10. After 3 minutes rest: BP LUE was 183/62 mmHg HR 79 bpm, SPO2% 98%. No SOB.    NMR:  Standing with close SUP next to support surface:  Standing on airex pad: Feet apart, EO - 1x60 sec Feet apart EC - 2x60 sec Feet together, EO 2x45 sec Feet together, EC 2x60 sec Most challenged with eyes closed  On flat ground: Tandem stance 60 seconds each;  BP at end of session:  LUE 163/62 mmHg, HR 76  bpm    Pt arrives with excellent motivation today. Session continues to be limited by elevated BP during exercise as well as fatigue with endurance-focused ambulation. She did progress total ambulation distance during session however a seated rest break was taken after 2 laps rather than 3. Pt vitals and exertion exertion levels improved within 3 minutes of seated rest. Stability exercises were performed on airex pad - very little challenge with eyes open. She also performed well with eyes closed and feet apart, demo decreased sway as time progressed. Pt did have difficulty with eyes closed and feet together. Tandem stance performed on flat ground with good stability, little challenge. Would benefit from progressing tandem stance to a compliant surface. BP was controlled at end of session, reading 163/62 (just slightly above initial BP reading). The pt will benefit from further skilled PT to improve strength, balance and mobility to improve QOL.       PT Short Term Goals - 12/19/20 1031       PT SHORT TERM GOAL #1   Title Pt will be independent with HEP in order to improve strength and balance in order to decrease fall risk and improve function at home and work.    Baseline 09/27/2020- Patient has no formal HEP in place, 5/19: independent; 7/12: not performing, handout is lost    Time 6    Period Weeks    Status On-going    Target Date 01/16/21               PT Long Term Goals - 12/22/20 0905       PT LONG TERM GOAL #1   Title Pt will improve FOTO score from 61  to target score of 64 to display perceived improvements in ability to complete ADL's and functional mobility activities.    Baseline 09/27/2020: FOTO=61, 10/26/20: 64%; 7/12: 58    Time 8    Period Weeks    Status On-going    Target Date 02/13/21      PT LONG TERM GOAL #2   Title Pt will improve DGI by at least 3 points in order to demonstrate clinically significant improvement in balance and decreased risk for falls.     Baseline 09/27/2020-DGI=18/24, 5/19: 18/24 7/12: not tested; 7/15: 22/24    Time 8    Period Weeks    Status Achieved    Target Date 02/13/21      PT LONG TERM GOAL #3   Title Pt will improve ABC by at least 10 % in order to demonstrate clinically significant improvement  in balance confidence.    Baseline 09/27/2020-68.1%, 5/19: 78% 7/12: not assessed; 7/15: 61.25    Time 8    Period Weeks    Status Achieved    Target Date 02/13/21      PT LONG TERM GOAL #4   Title Pt will decrease 5TSTS by at least 5 seconds (initial =23 sec with no UE support) in order to demonstrate clinically significant improvement in LE strength.    Baseline 09/27/2020= 23 sec. with No UE support, 5/19: 15 sec; 7/12: not assessed; 7/15: 17.68 sec    Time 8    Period Weeks    Status On-going    Target Date 02/13/21      PT LONG TERM GOAL #5   Title Pt will decrease TUG to below 14 seconds/decrease in order to demonstrate decreased fall risk.    Baseline 09/27/2020- TUG score= 15.15 sec without UE support, 5/19: 8.89 sec; 12/19/20: not assessed; 7/15: 9.8 sec    Time 8    Period Weeks    Status Achieved      PT LONG TERM GOAL #6   Title Patient will increase six minute walk test distance to >1000 for progression to community ambulator and improve gait ability    Baseline 4/26: 695 ft with one seated rest break, 5/19: 975 feet; 7/12: not safe to attempt, BP too high    Time 8    Period Weeks    Status On-going    Target Date 02/13/21                   Plan - 01/02/21 1217     Clinical Impression Statement Pt arrives with excellent motivation today. Session continues to be limited by elevated BP during exercise as well as fatigue with endurance-focused ambulation. She did progress total ambulation distance during session however a seated rest break was taken after 2 laps rather than 3. Pt vitals and exertion exertion levels improved within 3 minutes of seated rest. Stability exercises were performed on  airex pad - very little challenge with eyes open. She also performed well with eyes closed and feet apart, demo decreased sway as time progressed. Pt did have difficulty with eyes closed and feet together. Tandem stance performed on flat ground with good stability, little challenge. Would benefit from progressing tandem stance to a compliant surface. BP was controlled at end of session, reading 163/62 (just slightly above initial BP reading). The pt will benefit from further skilled PT to improve strength, balance and mobility to improve QOL.    Personal Factors and Comorbidities Comorbidity 3+    Comorbidities HTN, MS, Arthritis, COPD    Examination-Activity Limitations Bend;Carry;Continence;Lift;Squat;Stairs;Stand    Examination-Participation Restrictions Community Activity;Yard Work    Stability/Clinical Decision Making Stable/Uncomplicated    Rehab Potential Poor    PT Frequency 2x / week    PT Duration 8 weeks    PT Treatment/Interventions ADLs/Self Care Home Management;Cryotherapy;Moist Heat;DME Instruction;Gait training;Stair training;Functional mobility training;Therapeutic activities;Therapeutic exercise;Balance training;Neuromuscular re-education;Patient/family education;Manual techniques;Passive range of motion    PT Next Visit Plan Limit AMB activity if BP conitnues to go to unsafe HTN ([id number] systolic); review HEP for home including balance prn    PT Home Exercise Plan pt unaware of location of her original handout, has not been compliant, warrants review (12/19/20)    Consulted and Agree with Plan of Care Patient             Patient will benefit from skilled therapeutic intervention in order  to improve the following deficits and impairments:  Abnormal gait, Cardiopulmonary status limiting activity, Decreased activity tolerance, Decreased balance, Decreased coordination, Decreased endurance, Decreased mobility, Decreased range of motion, Decreased strength, Difficulty walking,  Impaired flexibility, Obesity, Pain  Visit Diagnosis: Other abnormalities of gait and mobility  Muscle weakness (generalized)  Unsteadiness on feet  Abnormality of gait and mobility  Difficulty in walking, not elsewhere classified  Other lack of coordination     Problem List Patient Active Problem List   Diagnosis Date Noted   S/P ORIF (open reduction internal fixation) fracture 02/20/2018   DJD (degenerative joint disease) of knee 01/02/2017   Advanced care planning/counseling discussion 01/02/2017   Thrombocytopenia ([place]) 10/30/2015   Multiple sclerosis ([place])    Essential hypertension    Hyperlipidemia    Patrina Levering PT, DPT  [facility] 01/02/2021, 12:41 PM  [place] MAIN [facility] SERVICES [address] [place], [place], [address] Phone: [phone]   Fax:  [phone]  Name: Leslie Smith MRN: [id number] Date of Birth: Nov 03, 1950

## 2021-01-04 ENCOUNTER — Ambulatory Visit: Payer: Medicare Other

## 2021-01-08 ENCOUNTER — Ambulatory Visit: Payer: Medicare Other | Attending: Neurology | Admitting: Physical Therapy

## 2021-01-08 DIAGNOSIS — R2681 Unsteadiness on feet: Secondary | ICD-10-CM | POA: Insufficient documentation

## 2021-01-08 DIAGNOSIS — M6281 Muscle weakness (generalized): Secondary | ICD-10-CM | POA: Insufficient documentation

## 2021-01-08 DIAGNOSIS — R2689 Other abnormalities of gait and mobility: Secondary | ICD-10-CM | POA: Insufficient documentation

## 2021-01-08 DIAGNOSIS — R262 Difficulty in walking, not elsewhere classified: Secondary | ICD-10-CM | POA: Insufficient documentation

## 2021-01-08 DIAGNOSIS — R269 Unspecified abnormalities of gait and mobility: Secondary | ICD-10-CM | POA: Insufficient documentation

## 2021-01-08 DIAGNOSIS — R278 Other lack of coordination: Secondary | ICD-10-CM | POA: Insufficient documentation

## 2021-01-10 ENCOUNTER — Ambulatory Visit: Payer: Medicare Other

## 2021-01-10 ENCOUNTER — Other Ambulatory Visit: Payer: Self-pay | Admitting: Internal Medicine

## 2021-01-10 DIAGNOSIS — I25118 Atherosclerotic heart disease of native coronary artery with other forms of angina pectoris: Secondary | ICD-10-CM

## 2021-01-10 DIAGNOSIS — I700 Atherosclerosis of aorta: Secondary | ICD-10-CM | POA: Insufficient documentation

## 2021-01-10 DIAGNOSIS — I2510 Atherosclerotic heart disease of native coronary artery without angina pectoris: Secondary | ICD-10-CM | POA: Insufficient documentation

## 2021-01-15 ENCOUNTER — Ambulatory Visit: Payer: Medicare Other

## 2021-01-17 ENCOUNTER — Other Ambulatory Visit: Payer: Medicare Other

## 2021-01-17 ENCOUNTER — Ambulatory Visit: Payer: Medicare Other | Admitting: Physical Therapy

## 2021-01-17 ENCOUNTER — Encounter
Admission: RE | Admit: 2021-01-17 | Discharge: 2021-01-17 | Disposition: A | Discharge status: Home / Self Care | Payer: Medicare Other | Source: Ambulatory Visit | Attending: Internal Medicine | Admitting: Internal Medicine

## 2021-01-17 ENCOUNTER — Other Ambulatory Visit: Payer: Self-pay

## 2021-01-17 DIAGNOSIS — I25118 Atherosclerotic heart disease of native coronary artery with other forms of angina pectoris: Secondary | ICD-10-CM | POA: Insufficient documentation

## 2021-01-17 MED ORDER — REGADENOSON 0.4 MG/5ML IV SOLN
0.4000 mg | Freq: Once | INTRAVENOUS | Status: AC
  Administered 2021-01-17: 0.4 mg via INTRAVENOUS

## 2021-01-17 MED ORDER — TECHNETIUM TC 99M TETROFOSMIN IV KIT
30.0000 | PACK | Freq: Once | INTRAVENOUS | Status: AC | PRN
  Administered 2021-01-17: 30.9 via INTRAVENOUS

## 2021-01-18 ENCOUNTER — Encounter
Admission: RE | Admit: 2021-01-18 | Discharge: 2021-01-18 | Disposition: A | Discharge status: Home / Self Care | Payer: Medicare Other | Source: Ambulatory Visit | Attending: Internal Medicine | Admitting: Internal Medicine

## 2021-01-18 LAB — NM MYOCAR MULTI W/SPECT W/WALL MOTION / EF
Estimated workload: 1 METS
Exercise duration (min): 1 min
Exercise duration (sec): 11 s
LV dias vol: 98 mL (ref 46–106)
LV sys vol: 35 mL
Peak HR: 89 {beats}/min
Percent HR: 59 %
Rest HR: 73 {beats}/min
SDS: 3
SRS: 7
SSS: 3
TID: 0.86

## 2021-01-18 MED ORDER — TECHNETIUM TC 99M TETROFOSMIN IV KIT
30.0000 | PACK | Freq: Once | INTRAVENOUS | Status: AC | PRN
  Administered 2021-01-18: 29.6 via INTRAVENOUS

## 2021-01-23 ENCOUNTER — Other Ambulatory Visit: Payer: Self-pay

## 2021-01-23 ENCOUNTER — Ambulatory Visit: Payer: Medicare Other

## 2021-01-23 ENCOUNTER — Other Ambulatory Visit: Payer: Medicare Other

## 2021-01-23 ENCOUNTER — Ambulatory Visit: Payer: Medicare Other | Admitting: Oncology

## 2021-01-23 DIAGNOSIS — R269 Unspecified abnormalities of gait and mobility: Secondary | ICD-10-CM | POA: Diagnosis present

## 2021-01-23 DIAGNOSIS — M6281 Muscle weakness (generalized): Secondary | ICD-10-CM | POA: Diagnosis present

## 2021-01-23 DIAGNOSIS — R278 Other lack of coordination: Secondary | ICD-10-CM | POA: Diagnosis present

## 2021-01-23 DIAGNOSIS — R262 Difficulty in walking, not elsewhere classified: Secondary | ICD-10-CM | POA: Diagnosis present

## 2021-01-23 DIAGNOSIS — R2689 Other abnormalities of gait and mobility: Secondary | ICD-10-CM | POA: Diagnosis not present

## 2021-01-23 DIAGNOSIS — R2681 Unsteadiness on feet: Secondary | ICD-10-CM | POA: Diagnosis present

## 2021-01-23 NOTE — Therapy (Signed)
[place] MAIN [facility] SERVICES [address] [place], [place], [address] Phone: [phone]   Fax:  [phone]  Physical Therapy Treatment Physical Therapy Progress Note   Dates of reporting period  10/31/20   to   01/23/21  Patient Details  Name: Leslie Smith MRN: [id number] Date of Birth: 1950-09-11 Referring Provider (PT): Dr. Darlin Priestly   Encounter Date: 01/23/2021   PT End of Session - 01/23/21 1604     Visit Number 20    Number of Visits 25    Date for PT Re-Evaluation 03/13/21    Authorization Type Medicare A&B; Medicaid 2nd; VL review after 25 visits    Authorization Time Period 9/32/67-06/12/43; previous cert 01/17/9832-02/01/538    Authorization - Visit Number 18    Authorization - Number of Visits 25    PT Start Time 1600    PT Stop Time 7673    PT Time Calculation (min) 44 min    Equipment Utilized During Treatment Gait belt    Activity Tolerance Patient tolerated treatment well    Behavior During Therapy WFL for tasks assessed/performed             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   Bladder incontinence    Cancer ([place]) 2014   brain tumor, resected   Cellulitis and abscess of leg 2019   2 episodes this year   Complication of anesthesia    DJD (degenerative joint disease) of knee 2018   Fall    Fatigue    History of blood transfusion    Hypertension    MS (multiple sclerosis) ([place])    Myelitis ([place])    Neuromuscular disorder ([place]) 2001   multiple sclerosis diagnosed by MRI   PONV (postoperative nausea and vomiting)    severe   Thrombocytopenia ([place])     Past Surgical History:  Procedure Laterality Date   ABDOMINAL HYSTERECTOMY  2011   required wound vac for 6 weeks. ovaries had grown attached to her back bone   BRAIN SURGERY  2015   tumor resection-denies seizures   BREAST SURGERY Left 2014   biopsy. negative   HARDWARE REMOVAL Left 05/05/2018   Procedure:  HARDWARE REMOVAL- LEFT ANKLE;  Surgeon: Hessie Knows, MD;  Location: [facility];  Service: Orthopedics;  Laterality: Left;   I & D EXTREMITY Left 08/25/2015   Procedure: IRRIGATION AND DEBRIDEMENT THUMB;  Surgeon: Iran Planas, MD;  Location: [place];  Service: Orthopedics;  Laterality: Left;   ORIF ANKLE FRACTURE Left 02/20/2018   Procedure: OPEN REDUCTION INTERNAL FIXATION (ORIF) ANKLE FRACTURE;  Surgeon: Hessie Knows, MD;  Location: [facility];  Service: Orthopedics;  Laterality: Left;    There were no vitals filed for this visit.   Subjective Assessment - 01/23/21 1610     Subjective Patient has not been seen since 01/02/21 as there was a mixup with schedules. Patient reports she was taken off her pain medicine and is aching throughout her body. Two and a half month ago her kidneys were tested and was taken off the fluid pills. She gained 12 pounds in a week and half. Went to cardiologist who took her off motrin.    Pertinent History Patient diagnosed with MS in 2001. Past medical history significant for HTN, MS, DJD right knee, Hyperlipidemia, Thrombocytopenia, S/p ORIF Left ankle fracture.    How long can you sit comfortably? No restrictions    How long can you stand comfortably?  No baseline ADL/IADL activity limited by standing tolerance    How long can you walk comfortably? today is limited by her walk to mailbox; maybe 15 min at evaluation    Currently in Pain? Yes    Pain Score 6     Pain Location Generalized    Pain Descriptors / Indicators Aching    Pain Type Chronic pain;Neuropathic pain    Pain Onset 1 to 4 weeks ago    Pain Frequency Intermittent                 BP at start of session 161/63  Goals:  FOTO: 55 5xSTS: 13.8 seconds ( improved)  6 min walk test: 610 ft ; BP after test 197/57; excessive LLE rotation externally   Treatment:  Standing with close CGA next to support surface:  Standing on airex pad: Standing with CGA next to support surface:  Airex pad:  static stand 30 seconds x 2 trials, noticeable trembling of ankles/LE's with fatigue and challenge to maintain stability Airex pad: horizontal head turns 30 seconds scanning room 10x ; cueing for arc of motion  Airex pad: vertical head turns 30 seconds, cueing for arc of motion, noticeable sway with upward gaze increasing demand on ankle righting reaction musculature  Seated: GTB hamstring curls 15x each LE GTB around bilateral feet: march with df 10x each LE ; very challenging for patient       Patient's condition has the potential to improve in response to therapy. Maximum improvement is yet to be obtained. The anticipated improvement is attainable and reasonable in a generally predictable time.  Patient reports she has been having difficulties with her medications and having increased pain and fatigue.   Patient performed goals this session; improving her 5x STS however her other goals are limited in part due to elevated blood pressure, fatigue, and shortness of breath. Patient is fatigued quicker than she was previously in part due to medication changes and changes in medical history. She remains highly motivated despite her pain and shortness of breath. Patient's condition has the potential to improve in response to therapy. Maximum improvement is yet to be obtained. The anticipated improvement is attainable and reasonable in a generally predictable time. The pt will benefit from further skilled PT to improve strength, balance and mobility to improve QOL.                    PT Education - 01/23/21 1610     Education Details goals, progress note    Person(s) Educated Patient    Methods Explanation;Demonstration;Tactile cues;Verbal cues    Comprehension Verbalized understanding;Returned demonstration;Verbal cues required;Tactile cues required              PT Short Term Goals - 01/24/21 0731       PT SHORT TERM GOAL #1   Title Pt will be independent with HEP in  order to improve strength and balance in order to decrease fall risk and improve function at home and work.    Baseline 09/27/2020- Patient has no formal HEP in place, 5/19: independent; 7/12: not performing, handout is lost 8/17: depends on the day due to medication changes and pain    Time 6    Period Weeks    Status On-going    Target Date 01/16/21               PT Long Term Goals - 01/24/21 0729       PT LONG TERM GOAL #  1   Title Pt will improve FOTO score from 61  to target score of 64 to display perceived improvements in ability to complete ADL's and functional mobility activities.    Baseline 09/27/2020: FOTO=61, 10/26/20: 64%; 7/12: 58 8/17: 55%    Time 8    Period Weeks    Status On-going    Target Date 02/13/21      PT LONG TERM GOAL #2   Title Pt will improve DGI by at least 3 points in order to demonstrate clinically significant improvement in balance and decreased risk for falls.    Baseline 09/27/2020-DGI=18/24, 5/19: 18/24 7/12: not tested; 7/15: 22/24    Time 8    Period Weeks    Status Achieved      PT LONG TERM GOAL #3   Title Pt will improve ABC by at least 10 % in order to demonstrate clinically significant improvement in balance confidence.    Baseline 09/27/2020-68.1%, 5/19: 78% 7/12: not assessed; 7/15: 61.25    Time 8    Period Weeks    Status Achieved      PT LONG TERM GOAL #4   Title Pt will decrease 5TSTS by at least 5 seconds (initial =23 sec with no UE support) in order to demonstrate clinically significant improvement in LE strength.    Baseline 09/27/2020= 23 sec. with No UE support, 5/19: 15 sec; 7/12: not assessed; 7/15: 17.68 sec 8/17: 13.8 seconds    Time 8    Period Weeks    Status Partially Met    Target Date 02/13/21      PT LONG TERM GOAL #5   Title Pt will decrease TUG to below 14 seconds/decrease in order to demonstrate decreased fall risk.    Baseline 09/27/2020- TUG score= 15.15 sec without UE support, 5/19: 8.89 sec; 12/19/20: not  assessed; 7/15: 9.8 sec    Time 8    Period Weeks    Status Achieved      PT LONG TERM GOAL #6   Title Patient will increase six minute walk test distance to >1000 for progression to community ambulator and improve gait ability    Baseline 4/26: 695 ft with one seated rest break, 5/19: 975 feet; 7/12: not safe to attempt, BP too high 8/17: 610 ft with multiple rest breaks    Time 8    Period Weeks    Status On-going    Target Date 02/13/21                   Plan - 01/24/21 2620     Clinical Impression Statement Patient performed goals this session; improving her 5x STS however her other goals are limited in part due to elevated blood pressure, fatigue, and shortness of breath. Patient is fatigued quicker than she was previously in part due to medication changes and changes in medical history. She remains highly motivated despite her pain and shortness of breath. Patient's condition has the potential to improve in response to therapy. Maximum improvement is yet to be obtained. The anticipated improvement is attainable and reasonable in a generally predictable time. The pt will benefit from further skilled PT to improve strength, balance and mobility to improve QOL.    Personal Factors and Comorbidities Comorbidity 3+    Comorbidities HTN, MS, Arthritis, COPD    Examination-Activity Limitations Bend;Carry;Continence;Lift;Squat;Stairs;Stand    Examination-Participation Restrictions Community Activity;Yard Work    Stability/Clinical Decision Making Stable/Uncomplicated    Rehab Potential Poor    PT Frequency 2x /  week    PT Duration 8 weeks    PT Treatment/Interventions ADLs/Self Care Home Management;Cryotherapy;Moist Heat;DME Instruction;Gait training;Stair training;Functional mobility training;Therapeutic activities;Therapeutic exercise;Balance training;Neuromuscular re-education;Patient/family education;Manual techniques;Passive range of motion    PT Next Visit Plan Limit AMB  activity if BP conitnues to go to unsafe HTN (>194RDEY systolic); review HEP for home including balance prn    PT Home Exercise Plan pt unaware of location of her original handout, has not been compliant, warrants review (12/19/20)    Consulted and Agree with Plan of Care Patient             Patient will benefit from skilled therapeutic intervention in order to improve the following deficits and impairments:  Abnormal gait, Cardiopulmonary status limiting activity, Decreased activity tolerance, Decreased balance, Decreased coordination, Decreased endurance, Decreased mobility, Decreased range of motion, Decreased strength, Difficulty walking, Impaired flexibility, Obesity, Pain  Visit Diagnosis: Other abnormalities of gait and mobility  Muscle weakness (generalized)  Unsteadiness on feet     Problem List Patient Active Problem List   Diagnosis Date Noted   S/P ORIF (open reduction internal fixation) fracture 02/20/2018   DJD (degenerative joint disease) of knee 01/02/2017   Advanced care planning/counseling discussion 01/02/2017   Thrombocytopenia ([place]) 10/30/2015   Multiple sclerosis ([place])    Essential hypertension    Hyperlipidemia    Janna Arch, PT, DPT  01/24/2021, 7:34 AM  [place] MAIN [facility] SERVICES [address] [place], [place], [address] Phone: [phone]   Fax:  [phone]  Name: Leslie Smith MRN: [id number] Date of Birth: September 17, 1950

## 2021-01-25 ENCOUNTER — Other Ambulatory Visit: Payer: Self-pay

## 2021-01-25 ENCOUNTER — Ambulatory Visit: Payer: Medicare Other

## 2021-01-25 DIAGNOSIS — R2689 Other abnormalities of gait and mobility: Secondary | ICD-10-CM | POA: Diagnosis not present

## 2021-01-25 DIAGNOSIS — M6281 Muscle weakness (generalized): Secondary | ICD-10-CM

## 2021-01-25 DIAGNOSIS — R2681 Unsteadiness on feet: Secondary | ICD-10-CM

## 2021-01-25 NOTE — Therapy (Signed)
[place] MAIN [facility] SERVICES [address] [place], [place], [address] Phone: [phone]   Fax:  [phone]  Physical Therapy Treatment  Patient Details  Name: Leslie Smith MRN: [id number] Date of Birth: [birth year] Referring Provider (PT): Dr. Darlin Priestly   Encounter Date: 01/25/2021   PT End of Session - 01/25/21 1503     Visit Number 21    Number of Visits 25    Date for PT Re-Evaluation 03/13/21    Authorization Type Medicare A&B; Medicaid 2nd; VL review after 25 visits    Authorization Time Period 12/04/01-5/0/09; previous cert 3/81/8299-3/71/6967    Authorization - Visit Number 59    Authorization - Number of Visits 25    PT Start Time 8938    PT Stop Time 1558    PT Time Calculation (min) 44 min    Equipment Utilized During Treatment Gait belt    Activity Tolerance Patient tolerated treatment well    Behavior During Therapy WFL for tasks assessed/performed             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   Bladder incontinence    Cancer ([place]) 2014   brain tumor, resected   Cellulitis and abscess of leg 2019   2 episodes this year   Complication of anesthesia    DJD (degenerative joint disease) of knee 2018   Fall    Fatigue    History of blood transfusion    Hypertension    MS (multiple sclerosis) ([place])    Myelitis ([place])    Neuromuscular disorder ([place]) 2001   multiple sclerosis diagnosed by MRI   PONV (postoperative nausea and vomiting)    severe   Thrombocytopenia ([place])     Past Surgical History:  Procedure Laterality Date   ABDOMINAL HYSTERECTOMY  2011   required wound vac for 6 weeks. ovaries had grown attached to her back bone   BRAIN SURGERY  2015   tumor resection-denies seizures   BREAST SURGERY Left 2014   biopsy. negative   HARDWARE REMOVAL Left 05/05/2018   Procedure: HARDWARE REMOVAL- LEFT ANKLE;  Surgeon: Hessie Knows, MD;  Location: [facility];  Service:  Orthopedics;  Laterality: Left;   I & D EXTREMITY Left 08/25/2015   Procedure: IRRIGATION AND DEBRIDEMENT THUMB;  Surgeon: Iran Planas, MD;  Location: [facility];  Service: Orthopedics;  Laterality: Left;   ORIF ANKLE FRACTURE Left 02/20/2018   Procedure: OPEN REDUCTION INTERNAL FIXATION (ORIF) ANKLE FRACTURE;  Surgeon: Hessie Knows, MD;  Location: [facility];  Service: Orthopedics;  Laterality: Left;    There were no vitals filed for this visit.   Subjective Assessment - 01/25/21 1514     Subjective Patient reports she mowed her lawn yesterday. Is having overall discomfort/pain today.    Pertinent History Patient diagnosed with MS in 2001. Past medical history significant for HTN, MS, DJD right knee, Hyperlipidemia, Thrombocytopenia, S/p ORIF Left ankle fracture.    How long can you sit comfortably? No restrictions    How long can you stand comfortably? No baseline ADL/IADL activity limited by standing tolerance    How long can you walk comfortably? today is limited by her walk to mailbox; maybe 15 min at evaluation    Currently in Pain? Yes    Pain Score 7     Pain Location Generalized    Pain Orientation Lower    Pain Descriptors / Indicators Aching    Pain Onset  1 to 4 weeks ago                 BP at start of session 156/53   TREATMENT:   Neuro Re-ed: CGA and cues for sequencing and safety awareness -orange hurdle lateral step over 10x each LE with finger tip support -single LE step over/back 10x each LE, finger tip support -tandem stance 2x 30 seconds each LE placement  Standing with CGA next to support surface:  Airex pad: static stand 30 seconds x 2 trials, noticeable trembling of ankles/LE's with fatigue and challenge to maintain stability Airex pad: horizontal head turns 30 seconds scanning room 10x ; cueing for arc of motion  Airex pad: vertical head turns 30 seconds, cueing for arc of motion, noticeable sway with upward gaze increasing demand on ankle righting  reaction musculature Airex pad: one foot on airex pad one foot on airex pad, hold position for 30 seconds, switch legs, 2x each LE; Airex pad wall taps 30 seconds   TherEx: cues for body mechanics and sequencing.  GTB  15x each LE hamstring curl  RTB around bilateral LE's alternating 12x each LE  RTB abduction 15x  RTB single limb adduction 15x each LE     Patient required min VCs for balance stability, including to increase trunk control for less loss of balance with smaller base of support                      PT Education - 01/25/21 1502     Education Details exercise technique, body mechanics    Person(s) Educated Patient    Methods Explanation;Demonstration;Tactile cues;Verbal cues    Comprehension Verbalized understanding;Returned demonstration;Verbal cues required;Tactile cues required              PT Short Term Goals - 01/24/21 0731       PT SHORT TERM GOAL #1   Title Pt will be independent with HEP in order to improve strength and balance in order to decrease fall risk and improve function at home and work.    Baseline 09/27/2020- Patient has no formal HEP in place, 5/19: independent; 7/12: not performing, handout is lost 8/17: depends on the day due to medication changes and pain    Time 6    Period Weeks    Status On-going    Target Date 01/16/21               PT Long Term Goals - 01/24/21 0729       PT LONG TERM GOAL #1   Title Pt will improve FOTO score from 61  to target score of 64 to display perceived improvements in ability to complete ADL's and functional mobility activities.    Baseline 09/27/2020: FOTO=61, 10/26/20: 64%; 7/12: 58 8/17: 55%    Time 8    Period Weeks    Status On-going    Target Date 02/13/21      PT LONG TERM GOAL #2   Title Pt will improve DGI by at least 3 points in order to demonstrate clinically significant improvement in balance and decreased risk for falls.    Baseline 09/27/2020-DGI=18/24, 5/19: 18/24  7/12: not tested; 7/15: 22/24    Time 8    Period Weeks    Status Achieved      PT LONG TERM GOAL #3   Title Pt will improve ABC by at least 10 % in order to demonstrate clinically significant improvement in balance confidence.  Baseline 09/27/2020-68.1%, 5/19: 78% 7/12: not assessed; 7/15: 61.25    Time 8    Period Weeks    Status Achieved      PT LONG TERM GOAL #4   Title Pt will decrease 5TSTS by at least 5 seconds (initial =23 sec with no UE support) in order to demonstrate clinically significant improvement in LE strength.    Baseline 09/27/2020= 23 sec. with No UE support, 5/19: 15 sec; 7/12: not assessed; 7/15: 17.68 sec 8/17: 13.8 seconds    Time 8    Period Weeks    Status Partially Met    Target Date 02/13/21      PT LONG TERM GOAL #5   Title Pt will decrease TUG to below 14 seconds/decrease in order to demonstrate decreased fall risk.    Baseline 09/27/2020- TUG score= 15.15 sec without UE support, 5/19: 8.89 sec; 12/19/20: not assessed; 7/15: 9.8 sec    Time 8    Period Weeks    Status Achieved      PT LONG TERM GOAL #6   Title Patient will increase six minute walk test distance to >1000 for progression to community ambulator and improve gait ability    Baseline 4/26: 695 ft with one seated rest break, 5/19: 975 feet; 7/12: not safe to attempt, BP too high 8/17: 610 ft with multiple rest breaks    Time 8    Period Weeks    Status On-going    Target Date 02/13/21                   Plan - 01/25/21 2023     Clinical Impression Statement Patient has intermittent R knee pain that limits weight shift and weight acceptance interventions. Seated interventions tolerated well with no pain increase. Monitoring of vitals required to ensure therapeutic range. Her ankle righting reactions are improving however dynadisc proved to be challenging for patient this session. The pt will benefit from further skilled PT to improve strength, balance and mobility to improve QOL.     Personal Factors and Comorbidities Comorbidity 3+    Comorbidities HTN, MS, Arthritis, COPD    Examination-Activity Limitations Bend;Carry;Continence;Lift;Squat;Stairs;Stand    Examination-Participation Restrictions Community Activity;Yard Work    Stability/Clinical Decision Making Stable/Uncomplicated    Rehab Potential Poor    PT Frequency 2x / week    PT Duration 8 weeks    PT Treatment/Interventions ADLs/Self Care Home Management;Cryotherapy;Moist Heat;DME Instruction;Gait training;Stair training;Functional mobility training;Therapeutic activities;Therapeutic exercise;Balance training;Neuromuscular re-education;Patient/family education;Manual techniques;Passive range of motion    PT Next Visit Plan Limit AMB activity if BP conitnues to go to unsafe HTN (>244WNUU systolic); review HEP for home including balance prn    PT Home Exercise Plan pt unaware of location of her original handout, has not been compliant, warrants review (12/19/20)    Consulted and Agree with Plan of Care Patient             Patient will benefit from skilled therapeutic intervention in order to improve the following deficits and impairments:  Abnormal gait, Cardiopulmonary status limiting activity, Decreased activity tolerance, Decreased balance, Decreased coordination, Decreased endurance, Decreased mobility, Decreased range of motion, Decreased strength, Difficulty walking, Impaired flexibility, Obesity, Pain  Visit Diagnosis: Other abnormalities of gait and mobility  Muscle weakness (generalized)  Unsteadiness on feet     Problem List Patient Active Problem List   Diagnosis Date Noted   S/P ORIF (open reduction internal fixation) fracture 02/20/2018   DJD (degenerative joint disease) of knee 01/02/2017  Advanced care planning/counseling discussion 01/02/2017   Thrombocytopenia ([place]) 10/30/2015   Multiple sclerosis ([place])    Essential hypertension    Hyperlipidemia     Janna Arch, PT,  DPT  01/25/2021, 8:25 PM  [place] [facility] SERVICES [address] [place], [place], [address] Phone: [phone]   Fax:  [phone]  Name: Leslie Smith MRN: [id number] Date of Birth: Feb 10, 1951

## 2021-01-28 ENCOUNTER — Other Ambulatory Visit: Payer: Self-pay | Admitting: *Deleted

## 2021-01-28 DIAGNOSIS — D693 Immune thrombocytopenic purpura: Secondary | ICD-10-CM

## 2021-01-30 ENCOUNTER — Ambulatory Visit: Payer: Medicare Other

## 2021-01-30 ENCOUNTER — Inpatient Hospital Stay: Payer: Medicare Other

## 2021-01-30 ENCOUNTER — Inpatient Hospital Stay: Payer: Medicare Other | Attending: Oncology | Admitting: Oncology

## 2021-01-30 VITALS — BP 149/59 | HR 72 | Temp 98.8°F | Wt 307.0 lb

## 2021-01-30 DIAGNOSIS — D693 Immune thrombocytopenic purpura: Secondary | ICD-10-CM

## 2021-01-30 DIAGNOSIS — E538 Deficiency of other specified B group vitamins: Secondary | ICD-10-CM | POA: Diagnosis not present

## 2021-01-30 LAB — CBC WITH DIFFERENTIAL/PLATELET
Abs Immature Granulocytes: 0.04 K/uL (ref 0.00–0.07)
Basophils Absolute: 0.1 K/uL (ref 0.0–0.1)
Basophils Relative: 1 %
Eosinophils Absolute: 0.3 K/uL (ref 0.0–0.5)
Eosinophils Relative: 3 %
HCT: 33.3 % — ABNORMAL LOW (ref 36.0–46.0)
Hemoglobin: 10.8 g/dL — ABNORMAL LOW (ref 12.0–15.0)
Immature Granulocytes: 1 %
Lymphocytes Relative: 15 %
Lymphs Abs: 1.3 K/uL (ref 0.7–4.0)
MCH: 29.1 pg (ref 26.0–34.0)
MCHC: 32.4 g/dL (ref 30.0–36.0)
MCV: 89.8 fL (ref 80.0–100.0)
Monocytes Absolute: 0.7 K/uL (ref 0.1–1.0)
Monocytes Relative: 8 %
Neutro Abs: 6.3 K/uL (ref 1.7–7.7)
Neutrophils Relative %: 72 %
Platelets: 93 K/uL — ABNORMAL LOW (ref 150–400)
RBC: 3.71 MIL/uL — ABNORMAL LOW (ref 3.87–5.11)
RDW: 13 % (ref 11.5–15.5)
WBC: 8.8 K/uL (ref 4.0–10.5)
nRBC: 0 % (ref 0.0–0.2)

## 2021-01-30 NOTE — Progress Notes (Signed)
Patient here for oncology follow-up appointment, concerns of pain 7/10, stomach cramps, numbness and SOB

## 2021-02-01 ENCOUNTER — Encounter: Payer: Self-pay | Admitting: Oncology

## 2021-02-01 NOTE — Progress Notes (Signed)
I connected with Leslie Smith on 02/01/21 at  2:00 PM EDT by video enabled telemedicine visit and verified that I am speaking with the correct person using two identifiers.   I discussed the limitations, risks, security and privacy concerns of performing an evaluation and management service by telemedicine and the availability of in-person appointments. I also discussed with the patient that there may be a patient responsible charge related to this service. The patient expressed understanding and agreed to proceed.  Other persons participating in the visit and their role in the encounter:  none  Patient's location:  cancer center Provider's location:  home  Chief Complaint:  routine f/u of thrombocytopenia  History of present illness:  Patient is a 70 year old female with a past medical history significant for hypertension, multiple sclerosis who was referred to [place] for thrombocytopenia.  Of note patient has moderate thrombocytopenia at least dating back for the last 5 years.  Her CBC in November 2015 showed white count of 6.7, H&H of 12.8/40.1 and a platelet count of 49.  Since then her platelet count has gone up and down and usually remains between 40s to 60s.  On one occasion in 2017 it did drop down to 19.  Most recent platelet count from 01/04/2019 showed white count of 9.4, H&H of 11.5/35.5 and a platelet count of 62.  Patient has undergone hysterectomy in 2011 as well as ankle surgery in 2019 despite her low platelet count and did not have any significant bleeding issues.     Results of blood work from 01/04/2019 were as follows: CBC showed white count of 9.4, H&H 11.5/35.5 and a platelet count of 62.  B12 level is low at 219.  Folate was normal.  HIV testing was negative.  Smear review was unremarkable and H. pylori stool antigen was negative   Interval history Patient has been having ongoing worsening shortness of breath over the last 3 to 4 months which is associated with limitation of her  ADLs.  She reports worsening fatigue and lower extremity edema.  She was evaluated by cardiology for this.  Echocardiogram showed a normal EF of greater than 55% and she also recently underwent stress testing.  From a hematological standpoint she denies any new bleeding or bruising.   Review of Systems  Constitutional:  Positive for malaise/fatigue. Negative for chills, fever and weight loss.  HENT:  Negative for congestion, ear discharge and nosebleeds.   Eyes:  Negative for blurred vision.  Respiratory:  Positive for shortness of breath. Negative for cough, hemoptysis, sputum production and wheezing.   Cardiovascular:  Positive for leg swelling. Negative for chest pain, palpitations, orthopnea and claudication.  Gastrointestinal:  Negative for abdominal pain, blood in stool, constipation, diarrhea, heartburn, melena, nausea and vomiting.  Genitourinary:  Negative for dysuria, flank pain, frequency, hematuria and urgency.  Musculoskeletal:  Negative for back pain, joint pain and myalgias.  Skin:  Negative for rash.  Neurological:  Negative for dizziness, tingling, focal weakness, seizures, weakness and headaches.  Endo/Heme/Allergies:  Does not bruise/bleed easily.  Psychiatric/Behavioral:  Negative for depression and suicidal ideas. The patient does not have insomnia.    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/2014   Bladder incontinence    Cancer ([place]) 2014   brain tumor, resected   Cellulitis and abscess of leg 2019   2 episodes this year   Complication of anesthesia    DJD (degenerative joint disease) of knee 2018    Fall    Fatigue    History of blood transfusion    Hypertension    MS (multiple sclerosis) ([place])    Myelitis ([place])    Neuromuscular disorder ([place]) 2001   multiple sclerosis diagnosed by MRI   PONV (postoperative nausea and vomiting)    severe   Thrombocytopenia ([place])     Past Surgical History:  Procedure Laterality Date   ABDOMINAL HYSTERECTOMY  2011   required wound vac for 6 weeks. ovaries had grown attached to her back bone   BRAIN SURGERY  2015   tumor resection-denies seizures   BREAST SURGERY Left 2014   biopsy. negative   HARDWARE REMOVAL Left 05/05/2018   Procedure: HARDWARE REMOVAL- LEFT ANKLE;  Surgeon: Hessie Knows, MD;  Location: [facility];  Service: Orthopedics;  Laterality: Left;   I & D EXTREMITY Left 08/25/2015   Procedure: IRRIGATION AND DEBRIDEMENT THUMB;  Surgeon: Iran Planas, MD;  Location: [place];  Service: Orthopedics;  Laterality: Left;   ORIF ANKLE FRACTURE Left 02/20/2018   Procedure: OPEN REDUCTION INTERNAL FIXATION (ORIF) ANKLE FRACTURE;  Surgeon: Hessie Knows, MD;  Location: [facility];  Service: Orthopedics;  Laterality: Left;    Social History   Socioeconomic History   Marital status: Single    Spouse name: Not on file   Number of children: Not on file   Years of education: high school, some college    Highest education level: High school graduate  Occupational History   Occupation: Training and development officer    Comment: disabled d/t MS  Tobacco Use   Smoking status: Never   Smokeless tobacco: Never  Vaping Use   Vaping Use: Never used  Substance and Sexual Activity   Alcohol use: Yes    Comment: occassional   Drug use: Yes    Types: Marijuana    Comment: uses for MS   Sexual activity: Not on file  Other Topics Concern   Not on file  Social History Narrative   Tai chi at [place] Determinants of Health   Financial Resource Strain: Not on file  Food Insecurity: Not on file  Transportation Needs: Not on file  Physical Activity: Not on file   Stress: Not on file  Social Connections: Not on file  Intimate Partner Violence: Not on file    Family History  Problem Relation Age of Onset   Heart disease Father    Cancer Father      Current Outpatient Medications:    Cholecalciferol (VITAMIN D-3) 25 MCG (1000 UT) CAPS, Take 1,000 Units by mouth daily., Disp: , Rfl:    ezetimibe (ZETIA) 10 MG tablet, Take 10 mg by mouth daily., Disp: , Rfl:    fluticasone (FLONASE) 50 MCG/ACT nasal spray, Place into the nose., Disp: , Rfl:    hydrALAZINE (APRESOLINE) 50 MG tablet, Take 1 tablet by mouth 2 (two) times daily., Disp: , Rfl:    hydrochlorothiazide (HYDRODIURIL) 25 MG tablet, Take 1 tablet (25 mg total) by mouth daily., Disp: 90 tablet, Rfl: 4   losartan (COZAAR) 100 MG tablet, Take 1 tablet (100  mg total) by mouth daily., Disp: 90 tablet, Rfl: 4   triamterene-hydrochlorothiazide (MAXZIDE) 75-50 MG tablet, Take 1 tablet by mouth daily., Disp: , Rfl:    ibuprofen (IBU) 800 MG tablet, Take 1 tablet (800 mg total) by mouth every 8 (eight) hours as needed for moderate pain. (Patient not taking: Reported on 01/30/2021), Disp: 30 tablet, Rfl: 6   Melatonin 3 MG TABS, Take 3-6 mg by mouth at bedtime. (Patient not taking: Reported on 01/30/2021), Disp: , Rfl:    niacin 100 MG tablet, Take 100 mg by mouth at bedtime. (Patient not taking: Reported on 01/30/2021), Disp: , Rfl:    vitamin B-12 (CYANOCOBALAMIN) 1000 MCG tablet, Take 1,000 mcg by mouth daily. (Patient not taking: Reported on 01/30/2021), Disp: , Rfl:   No results found.  No images are attached to the encounter.   CMP Latest Ref Rng & Units 01/04/2019  Glucose 70 - 99 mg/dL 97  BUN 8 - 23 mg/dL 15  Creatinine 0.44 - 1.00 mg/dL 0.83  Sodium 135 - 145 mmol/L 140  Potassium 3.5 - 5.1 mmol/L 3.8  Chloride 98 - 111 mmol/L 103  CO2 22 - 32 mmol/L 28  Calcium 8.9 - 10.3 mg/dL 9.0  Total Protein 6.5 - 8.1 g/dL 7.3  Total Bilirubin 0.3 - 1.2 mg/dL 0.6  Alkaline Phos 38 - 126 U/L 70   AST 15 - 41 U/L 15  ALT 0 - 44 U/L 14   CBC Latest Ref Rng & Units 01/30/2021  WBC 4.0 - 10.5 K/uL 8.8  Hemoglobin 12.0 - 15.0 g/dL 10.8(L)  Hematocrit 36.0 - 46.0 % 33.3(L)  Platelets 150 - 400 K/uL 93(L)     Observation/objective: Appears fatigued but otherwise in no acute distress.  Breathing is nonlabored  Assessment and plan:Patient is a 70 year old female with history of chronic ITP here for a routine follow-up   patient's platelet count over the last 2 to 3 years has been fluctuating between 50s to 60s.  Today it is 34 which is better than before.  Typically her hemoglobin ranges around 11 and presently is down to 10.8.  I suspect this is secondary to her underlying acute ongoing medical issues.  I will continue to monitor her hemoglobin conservatively at this time and see her back in 6 months with CBC ferritin and iron studies B12 and folate  Follow-up instructions:as above  I discussed the assessment and treatment plan with the patient. The patient was provided an opportunity to ask questions and all were answered. The patient agreed with the plan and demonstrated an understanding of the instructions.   The patient was advised to call back or seek an in-person evaluation if the symptoms worsen or if the condition fails to improve as anticipated.    Visit Diagnosis: 1. Chronic ITP (idiopathic thrombocytopenia) (HCC)   2. Vitamin B12 deficiency     Dr. Randa Evens, MD, MPH [facility] Tel- [phone] 02/01/2021 7:34 AM

## 2021-02-05 ENCOUNTER — Encounter: Payer: Self-pay | Admitting: Physical Therapy

## 2021-02-05 ENCOUNTER — Ambulatory Visit: Payer: Medicare Other | Admitting: Physical Therapy

## 2021-02-05 ENCOUNTER — Other Ambulatory Visit: Payer: Self-pay

## 2021-02-05 VITALS — BP 122/60

## 2021-02-05 DIAGNOSIS — R262 Difficulty in walking, not elsewhere classified: Secondary | ICD-10-CM

## 2021-02-05 DIAGNOSIS — M6281 Muscle weakness (generalized): Secondary | ICD-10-CM

## 2021-02-05 DIAGNOSIS — R2689 Other abnormalities of gait and mobility: Secondary | ICD-10-CM

## 2021-02-05 DIAGNOSIS — R278 Other lack of coordination: Secondary | ICD-10-CM

## 2021-02-05 DIAGNOSIS — R269 Unspecified abnormalities of gait and mobility: Secondary | ICD-10-CM

## 2021-02-05 DIAGNOSIS — R2681 Unsteadiness on feet: Secondary | ICD-10-CM

## 2021-02-05 NOTE — Therapy (Signed)
[place] MAIN [facility] SERVICES [address] [place], [place], [address] Phone: [phone]   Fax:  [phone]  Physical Therapy Treatment  Patient Details  Name: Leslie Smith MRN: [id number] Date of Birth: 03-19-1951 Referring Provider (PT): Dr. Darlin Priestly   Encounter Date: 02/05/2021   PT End of Session - 02/05/21 1316     Visit Number 22    Number of Visits 25    Date for PT Re-Evaluation 02/13/21    Authorization Type Medicare A&B; Medicaid 2nd; VL review after 25 visits    Authorization Time Period 9/67/59-06/15/36; previous cert 4/66/5993-5/70/1779    Authorization - Visit Number 33    Authorization - Number of Visits 25    PT Start Time 1303    PT Stop Time 1345    PT Time Calculation (min) 42 min    Equipment Utilized During Treatment Gait belt    Activity Tolerance Patient tolerated treatment well    Behavior During Therapy WFL for tasks assessed/performed             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[place]) 09/15/2014   Bladder incontinence    Cancer ([place]) 2014   brain tumor, resected   Cellulitis and abscess of leg 2019   2 episodes this year   Complication of anesthesia    DJD (degenerative joint disease) of knee 2018   Fall    Fatigue    History of blood transfusion    Hypertension    MS (multiple sclerosis) ([place])    Myelitis ([place])    Neuromuscular disorder ([place]) 2001   multiple sclerosis diagnosed by MRI   PONV (postoperative nausea and vomiting)    severe   Thrombocytopenia ([place])     Past Surgical History:  Procedure Laterality Date   ABDOMINAL HYSTERECTOMY  2011   required wound vac for 6 weeks. ovaries had grown attached to her back bone   BRAIN SURGERY  2015   tumor resection-denies seizures   BREAST SURGERY Left 2014   biopsy. negative   HARDWARE REMOVAL Left 05/05/2018   Procedure: HARDWARE REMOVAL- LEFT ANKLE;  Surgeon: Hessie Knows, MD;  Location: [facility];  Service:  Orthopedics;  Laterality: Left;   I & D EXTREMITY Left 08/25/2015   Procedure: IRRIGATION AND DEBRIDEMENT THUMB;  Surgeon: Iran Planas, MD;  Location: [place];  Service: Orthopedics;  Laterality: Left;   ORIF ANKLE FRACTURE Left 02/20/2018   Procedure: OPEN REDUCTION INTERNAL FIXATION (ORIF) ANKLE FRACTURE;  Surgeon: Hessie Knows, MD;  Location: [facility];  Service: Orthopedics;  Laterality: Left;    Vitals:   02/05/21 1305  BP: 122/60     Subjective Assessment - 02/05/21 1305     Subjective Patient reports increased soreness in feet over the last week. She reports its worse when they are swollen. She had to break down and get a new lawnmower because hers quit so she got a zero turn lawnmower. She said, "It will kill you if you don't know how to drive it."    Pertinent History Patient diagnosed with MS in 2001. Past medical history significant for HTN, MS, DJD right knee, Hyperlipidemia, Thrombocytopenia, S/p ORIF Left ankle fracture.    How long can you sit comfortably? No restrictions    How long can you stand comfortably? No baseline ADL/IADL activity limited by standing tolerance    How long can you walk comfortably? today is limited by her walk to mailbox; maybe 15  min at evaluation    Currently in Pain? Yes    Pain Score 5     Pain Location Ankle    Pain Orientation Right;Left    Pain Descriptors / Indicators Aching;Sore    Pain Type Chronic pain    Pain Onset 1 to 4 weeks ago    Pain Frequency Intermittent    Aggravating Factors  worse with standing/walking    Pain Relieving Factors rest/pain meds    Effect of Pain on Daily Activities decreased activity tolerance;                   TREATMENT: Instructed patient in LE strengthening;  Standing with 2# ankle weight: -hip flexion march x10 reps each -hip abduction SLR x10 reps each LE -hip extension SLR x10 reps each LE -BLE heel raises x10 reps Patient required min-moderate verbal/tactile cues for correct exercise  technique. Utilized ankle weights to reduce discomfort with swelling in BLE; Patient used rail assist intermittent for balance and stance control;   Neuro Re-ed: CGA and cues for sequencing and safety awareness -Forward/backward step over 1/2 bolster with intermittent finger tip hold x10 reps with cues for heel strike;  -lateral step over 1/2 bolster 10x each LE with finger tip support -tandem stance on 1/2 bolster (flat side up) with 2-0 rail assist 10 sec hold x3 reps each foot in front; -feet apart on 1/2 bolster:  Heel/toe rock with rail assist x10 reps  Feet apart, BUE wand flexion x10 reps with min A for safety;   Patient required intermittent finger tip hold and min A for safety while on compliant surface. She did exhibit decreased ankle strategies having increased difficulty keeping balance on compliant surfaces;   Standing with CGA-min A in parallel bars:  Airex pad:  Toe taps from airex to 6 inch step without rail assist x15 reps with min A for safety;    Standing one foot on airex, one foot on 6 inch step:   Unsupported standing 30 sec hold x1 rep each Patient did have increase difficulty with unsupported standing requiring CGA for balance recovery;   Patient tolerated well. She does report mild fatigue at end of session. She did have increased difficulty with reduced rail assist while on compliant surface.                     PT Short Term Goals - 01/24/21 0731       PT SHORT TERM GOAL #1   Title Pt will be independent with HEP in order to improve strength and balance in order to decrease fall risk and improve function at home and work.    Baseline 09/27/2020- Patient has no formal HEP in place, 5/19: independent; 7/12: not performing, handout is lost 8/17: depends on the day due to medication changes and pain    Time 6    Period Weeks    Status On-going    Target Date 01/16/21               PT Long Term Goals - 01/24/21 0729       PT LONG TERM  GOAL #1   Title Pt will improve FOTO score from 61  to target score of 64 to display perceived improvements in ability to complete ADL's and functional mobility activities.    Baseline 09/27/2020: FOTO=61, 10/26/20: 64%; 7/12: 58 8/17: 55%    Time 8    Period Weeks    Status On-going  Target Date 02/13/21      PT LONG TERM GOAL #2   Title Pt will improve DGI by at least 3 points in order to demonstrate clinically significant improvement in balance and decreased risk for falls.    Baseline 09/27/2020-DGI=18/24, 5/19: 18/24 7/12: not tested; 7/15: 22/24    Time 8    Period Weeks    Status Achieved      PT LONG TERM GOAL #3   Title Pt will improve ABC by at least 10 % in order to demonstrate clinically significant improvement in balance confidence.    Baseline 09/27/2020-68.1%, 5/19: 78% 7/12: not assessed; 7/15: 61.25    Time 8    Period Weeks    Status Achieved      PT LONG TERM GOAL #4   Title Pt will decrease 5TSTS by at least 5 seconds (initial =23 sec with no UE support) in order to demonstrate clinically significant improvement in LE strength.    Baseline 09/27/2020= 23 sec. with No UE support, 5/19: 15 sec; 7/12: not assessed; 7/15: 17.68 sec 8/17: 13.8 seconds    Time 8    Period Weeks    Status Partially Met    Target Date 02/13/21      PT LONG TERM GOAL #5   Title Pt will decrease TUG to below 14 seconds/decrease in order to demonstrate decreased fall risk.    Baseline 09/27/2020- TUG score= 15.15 sec without UE support, 5/19: 8.89 sec; 12/19/20: not assessed; 7/15: 9.8 sec    Time 8    Period Weeks    Status Achieved      PT LONG TERM GOAL #6   Title Patient will increase six minute walk test distance to >1000 for progression to community ambulator and improve gait ability    Baseline 4/26: 695 ft with one seated rest break, 5/19: 975 feet; 7/12: not safe to attempt, BP too high 8/17: 610 ft with multiple rest breaks    Time 8    Period Weeks    Status On-going     Target Date 02/13/21                   Plan - 02/05/21 1413     Clinical Impression Statement Patient motivated and participated well within session. Instructed patient in LE strengthening exercise. utilized ankle weights for resistance to reduce discomfort with LE swelling. Patient does require min VCs for proper exercise technique. Instructed patient in advanced balance exercise. Utilized compliant surface to challenge stance control. She did have difficulty with reduced rail assist while on compliant surfaces, requiring CGA to min A for safety. Patient was more fatigued at start of session due to being busy at home. She reports increased fatiue at end of session. She would benefit from additional skilled PT intervention to improve strength, balance and mobility;    Personal Factors and Comorbidities Comorbidity 3+    Comorbidities HTN, MS, Arthritis, COPD    Examination-Activity Limitations Bend;Carry;Continence;Lift;Squat;Stairs;Stand    Examination-Participation Restrictions Community Activity;Yard Work    Stability/Clinical Decision Making Stable/Uncomplicated    Rehab Potential Poor    PT Frequency 2x / week    PT Duration 8 weeks    PT Treatment/Interventions ADLs/Self Care Home Management;Cryotherapy;Moist Heat;DME Instruction;Gait training;Stair training;Functional mobility training;Therapeutic activities;Therapeutic exercise;Balance training;Neuromuscular re-education;Patient/family education;Manual techniques;Passive range of motion    PT Next Visit Plan Limit AMB activity if BP conitnues to go to unsafe HTN (>175ZWCH systolic); review HEP for home including balance prn  PT Home Exercise Plan pt unaware of location of her original handout, has not been compliant, warrants review (12/19/20)    Consulted and Agree with Plan of Care Patient             Patient will benefit from skilled therapeutic intervention in order to improve the following deficits and impairments:   Abnormal gait, Cardiopulmonary status limiting activity, Decreased activity tolerance, Decreased balance, Decreased coordination, Decreased endurance, Decreased mobility, Decreased range of motion, Decreased strength, Difficulty walking, Impaired flexibility, Obesity, Pain  Visit Diagnosis: Other abnormalities of gait and mobility  Muscle weakness (generalized)  Unsteadiness on feet  Abnormality of gait and mobility  Difficulty in walking, not elsewhere classified  Other lack of coordination     Problem List Patient Active Problem List   Diagnosis Date Noted   Atherosclerosis of abdominal aorta ([place]) 01/10/2021   Coronary artery disease involving native coronary artery of native heart 01/10/2021   OSA (obstructive sleep apnea) 04/19/2020   Statin intolerance 01/14/2020   S/P ORIF (open reduction internal fixation) fracture 02/20/2018   DJD (degenerative joint disease) of knee 01/02/2017   Advanced care planning/counseling discussion 01/02/2017   Thrombocytopenia ([place]) 10/30/2015   Multiple sclerosis ([place])    Essential hypertension    Hyperlipidemia    Benign neoplasm of brain ([place]) 04/30/2013   Cerebral meningioma ([place]) 04/29/2013   Malignant hypertension 04/29/2013   Obesity 04/15/2013    Zully Frane PT, DPT 02/05/2021, 2:21 PM  [place] [address] [place], [place], [address] Phone: [phone]   Fax:  [phone]  Name: Leslie KOLASINSKI MRN: [id number] Date of Birth: 30-Aug-1950

## 2021-02-07 ENCOUNTER — Encounter: Payer: Self-pay | Admitting: Physical Therapy

## 2021-02-07 ENCOUNTER — Other Ambulatory Visit: Payer: Self-pay

## 2021-02-07 ENCOUNTER — Ambulatory Visit: Payer: Medicare Other | Admitting: Physical Therapy

## 2021-02-07 VITALS — BP 128/51

## 2021-02-07 DIAGNOSIS — R262 Difficulty in walking, not elsewhere classified: Secondary | ICD-10-CM

## 2021-02-07 DIAGNOSIS — R2689 Other abnormalities of gait and mobility: Secondary | ICD-10-CM

## 2021-02-07 DIAGNOSIS — R2681 Unsteadiness on feet: Secondary | ICD-10-CM

## 2021-02-07 DIAGNOSIS — M6281 Muscle weakness (generalized): Secondary | ICD-10-CM

## 2021-02-07 NOTE — Therapy (Signed)
[place] MAIN [facility] SERVICES [address] [place], [place], [address] Phone: [phone]   Fax:  [phone]  Physical Therapy Treatment  Patient Details  Name: Leslie Smith MRN: [id number] Date of Birth: 1951/05/31 Referring Provider (PT): Dr. Darlin Priestly   Encounter Date: 02/07/2021   PT End of Session - 02/07/21 1159     Visit Number 23    Number of Visits 25    Date for PT Re-Evaluation 02/13/21    Authorization Type Medicare A&B; Medicaid 2nd; VL review after 25 visits    Authorization Time Period 6/38/17-12/09/14; previous cert 5/79/0383-3/38/3291    Authorization - Visit Number 44    Authorization - Number of Visits 25    PT Start Time 9166    PT Stop Time 1230    PT Time Calculation (min) 41 min    Equipment Utilized During Treatment Gait belt    Activity Tolerance Patient tolerated treatment well    Behavior During Therapy WFL for tasks assessed/performed             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[place]) 09/15/2014   Bladder incontinence    Cancer ([place]) 2014   brain tumor, resected   Cellulitis and abscess of leg 2019   2 episodes this year   Complication of anesthesia    DJD (degenerative joint disease) of knee 2018   Fall    Fatigue    History of blood transfusion    Hypertension    MS (multiple sclerosis) ([place])    Myelitis ([place])    Neuromuscular disorder ([place]) 2001   multiple sclerosis diagnosed by MRI   PONV (postoperative nausea and vomiting)    severe   Thrombocytopenia ([place])     Past Surgical History:  Procedure Laterality Date   ABDOMINAL HYSTERECTOMY  2011   required wound vac for 6 weeks. ovaries had grown attached to her back bone   BRAIN SURGERY  2015   tumor resection-denies seizures   BREAST SURGERY Left 2014   biopsy. negative   HARDWARE REMOVAL Left 05/05/2018   Procedure: HARDWARE REMOVAL- LEFT ANKLE;  Surgeon: Hessie Knows, MD;  Location: [facility];  Service:  Orthopedics;  Laterality: Left;   I & D EXTREMITY Left 08/25/2015   Procedure: IRRIGATION AND DEBRIDEMENT THUMB;  Surgeon: Iran Planas, MD;  Location: [place];  Service: Orthopedics;  Laterality: Left;   ORIF ANKLE FRACTURE Left 02/20/2018   Procedure: OPEN REDUCTION INTERNAL FIXATION (ORIF) ANKLE FRACTURE;  Surgeon: Hessie Knows, MD;  Location: [facility];  Service: Orthopedics;  Laterality: Left;    Vitals:   02/07/21 1158 02/07/21 1201  BP: (!) 145/48 (!) 128/51     Subjective Assessment - 02/07/21 1158     Subjective Patient reports increased soreness in BLE knee; She also reports feeling more weakness on left leg;    Pertinent History Patient diagnosed with MS in 2001. Past medical history significant for HTN, MS, DJD right knee, Hyperlipidemia, Thrombocytopenia, S/p ORIF Left ankle fracture.    How long can you sit comfortably? No restrictions    How long can you stand comfortably? No baseline ADL/IADL activity limited by standing tolerance    How long can you walk comfortably? today is limited by her walk to mailbox; maybe 15 min at evaluation    Currently in Pain? Yes    Pain Score 5     Pain Location Leg    Pain Orientation Right;Left  Pain Descriptors / Indicators Aching;Sore    Pain Type Chronic pain    Pain Onset 1 to 4 weeks ago    Pain Frequency Intermittent    Aggravating Factors  worse with standing/walking    Pain Relieving Factors rest/pain meds    Effect of Pain on Daily Activities decreased activity tolerance;    Multiple Pain Sites No                        TREATMENT: Instructed patient in LE strengthening;  Standing with 2# ankle weight: -hip flexion march x15 reps each -hip abduction SLR x15 reps each LE -BLE heel raises x15 reps Patient required min-moderate verbal/tactile cues for correct exercise technique. Utilized ankle weights to reduce discomfort with swelling in BLE; Patient used rail assist intermittent for balance and stance control;    Gait around gym: 150 feet x3 laps with fast/slow walking to allow for breath recovery and to promote energy conservation; patient required min Vcs to increase reciprocal arm swing, improve posture, increase step length for better gait safety;  Vitals after gait, : Spo2 97%, HR 101 bpm;   Neuro Re-ed: CGA and cues for sequencing and safety awareness Stepping over 1/2 bolster with one foot, hand clap then step back together x10 reps;   Airex beam: -tandem walk forward/backward x5 laps with 2-0 rail assist -side stepping unsupported x5 laps with CGA for safety -standing with feet apart, reaching tapping post it on wall reaching outside base of support x10 reps each with CGA for safety;      Patient tolerated well. She does report mild fatigue at end of session. She did have increased difficulty with reduced rail assist while on compliant surface.                          PT Education - 02/07/21 1159     Education Details exercise technique/body mechanics;    Person(s) Educated Patient    Methods Explanation;Verbal cues    Comprehension Verbalized understanding;Returned demonstration;Verbal cues required;Need further instruction              PT Short Term Goals - 01/24/21 0731       PT SHORT TERM GOAL #1   Title Pt will be independent with HEP in order to improve strength and balance in order to decrease fall risk and improve function at home and work.    Baseline 09/27/2020- Patient has no formal HEP in place, 5/19: independent; 7/12: not performing, handout is lost 8/17: depends on the day due to medication changes and pain    Time 6    Period Weeks    Status On-going    Target Date 01/16/21               PT Long Term Goals - 01/24/21 0729       PT LONG TERM GOAL #1   Title Pt will improve FOTO score from 61  to target score of 64 to display perceived improvements in ability to complete ADL's and functional mobility activities.    Baseline  09/27/2020: FOTO=61, 10/26/20: 64%; 7/12: 58 8/17: 55%    Time 8    Period Weeks    Status On-going    Target Date 02/13/21      PT LONG TERM GOAL #2   Title Pt will improve DGI by at least 3 points in order to demonstrate clinically significant improvement in balance and decreased  risk for falls.    Baseline 09/27/2020-DGI=18/24, 5/19: 18/24 7/12: not tested; 7/15: 22/24    Time 8    Period Weeks    Status Achieved      PT LONG TERM GOAL #3   Title Pt will improve ABC by at least 10 % in order to demonstrate clinically significant improvement in balance confidence.    Baseline 09/27/2020-68.1%, 5/19: 78% 7/12: not assessed; 7/15: 61.25    Time 8    Period Weeks    Status Achieved      PT LONG TERM GOAL #4   Title Pt will decrease 5TSTS by at least 5 seconds (initial =23 sec with no UE support) in order to demonstrate clinically significant improvement in LE strength.    Baseline 09/27/2020= 23 sec. with No UE support, 5/19: 15 sec; 7/12: not assessed; 7/15: 17.68 sec 8/17: 13.8 seconds    Time 8    Period Weeks    Status Partially Met    Target Date 02/13/21      PT LONG TERM GOAL #5   Title Pt will decrease TUG to below 14 seconds/decrease in order to demonstrate decreased fall risk.    Baseline 09/27/2020- TUG score= 15.15 sec without UE support, 5/19: 8.89 sec; 12/19/20: not assessed; 7/15: 9.8 sec    Time 8    Period Weeks    Status Achieved      PT LONG TERM GOAL #6   Title Patient will increase six minute walk test distance to >1000 for progression to community ambulator and improve gait ability    Baseline 4/26: 695 ft with one seated rest break, 5/19: 975 feet; 7/12: not safe to attempt, BP too high 8/17: 610 ft with multiple rest breaks    Time 8    Period Weeks    Status On-going    Target Date 02/13/21                   Plan - 02/07/21 1603     Clinical Impression Statement Patient motivated and participated well within session. BP continues to be well.  She does report increased swelling and soreness in knees this session. patient requires short seated rest breaks due to soreness and fatigue. Instructed patient in advanced balance exercise, utilizing compliant surface to challenge stance control. she does require CGA especially with reduced rail assist to improve stance control. Patient instructed in advanced LE strengthening. While standing, she did exhibit slight buckle of knee requiring rail assist and min A for safety. patient would benefit from additional skilled PT intervention to improve strength, balance and mobility;    Personal Factors and Comorbidities Comorbidity 3+    Comorbidities HTN, MS, Arthritis, COPD    Examination-Activity Limitations Bend;Carry;Continence;Lift;Squat;Stairs;Stand    Examination-Participation Restrictions Community Activity;Yard Work    Stability/Clinical Decision Making Stable/Uncomplicated    Rehab Potential Poor    PT Frequency 2x / week    PT Duration 8 weeks    PT Treatment/Interventions ADLs/Self Care Home Management;Cryotherapy;Moist Heat;DME Instruction;Gait training;Stair training;Functional mobility training;Therapeutic activities;Therapeutic exercise;Balance training;Neuromuscular re-education;Patient/family education;Manual techniques;Passive range of motion    PT Next Visit Plan Limit AMB activity if BP conitnues to go to unsafe HTN (>466ZLDJ systolic); review HEP for home including balance prn    PT Home Exercise Plan pt unaware of location of her original handout, has not been compliant, warrants review (12/19/20)    Consulted and Agree with Plan of Care Patient  Patient will benefit from skilled therapeutic intervention in order to improve the following deficits and impairments:  Abnormal gait, Cardiopulmonary status limiting activity, Decreased activity tolerance, Decreased balance, Decreased coordination, Decreased endurance, Decreased mobility, Decreased range of motion, Decreased  strength, Difficulty walking, Impaired flexibility, Obesity, Pain  Visit Diagnosis: Other abnormalities of gait and mobility  Muscle weakness (generalized)  Unsteadiness on feet  Difficulty in walking, not elsewhere classified     Problem List Patient Active Problem List   Diagnosis Date Noted   Atherosclerosis of abdominal aorta ([place]) 01/10/2021   Coronary artery disease involving native coronary artery of native heart 01/10/2021   OSA (obstructive sleep apnea) 04/19/2020   Statin intolerance 01/14/2020   S/P ORIF (open reduction internal fixation) fracture 02/20/2018   DJD (degenerative joint disease) of knee 01/02/2017   Advanced care planning/counseling discussion 01/02/2017   Thrombocytopenia ([place]) 10/30/2015   Multiple sclerosis ([place])    Essential hypertension    Hyperlipidemia    Benign neoplasm of brain ([place]) 04/30/2013   Cerebral meningioma ([place]) 04/29/2013   Malignant hypertension 04/29/2013   Obesity 04/15/2013    Lamond Glantz PT, DPT 02/07/2021, 4:05 PM  [facility] MAIN [facility] SERVICES [address] [place], [place], [address] Phone: [phone]   Fax:  [phone]  Name: MERILEE WIBLE MRN: [id number] Date of Birth: 07-02-1950

## 2021-02-13 ENCOUNTER — Other Ambulatory Visit: Payer: Self-pay

## 2021-02-13 ENCOUNTER — Ambulatory Visit: Payer: Medicare Other | Attending: Neurology

## 2021-02-13 DIAGNOSIS — R2689 Other abnormalities of gait and mobility: Secondary | ICD-10-CM | POA: Diagnosis not present

## 2021-02-13 DIAGNOSIS — R269 Unspecified abnormalities of gait and mobility: Secondary | ICD-10-CM | POA: Diagnosis present

## 2021-02-13 DIAGNOSIS — M6281 Muscle weakness (generalized): Secondary | ICD-10-CM | POA: Insufficient documentation

## 2021-02-13 DIAGNOSIS — R278 Other lack of coordination: Secondary | ICD-10-CM | POA: Diagnosis present

## 2021-02-13 DIAGNOSIS — R2681 Unsteadiness on feet: Secondary | ICD-10-CM | POA: Diagnosis present

## 2021-02-13 DIAGNOSIS — R262 Difficulty in walking, not elsewhere classified: Secondary | ICD-10-CM | POA: Diagnosis present

## 2021-02-13 NOTE — Therapy (Signed)
[place] MAIN [facility] SERVICES [address] [place], [place], [address] Phone: [phone]   Fax:  [phone]  Physical Therapy Treatment/RECERT  Patient Details  Name: Leslie Smith MRN: [id number] Date of Birth: 10/14/1950 Referring Provider (PT): Dr. Darlin Priestly   Encounter Date: 02/13/2021   PT End of Session - 02/13/21 1656     Visit Number 24    Number of Visits 40    Date for PT Re-Evaluation 04/10/21    Authorization Type Medicare A&B; Medicaid 2nd; VL review after 25 visits    Authorization Time Period 06/11/56-10/10/75; previous cert 02/01/2352-11/21/4313    Authorization - Visit Number 59    Authorization - Number of Visits 25    PT Start Time 1600    PT Stop Time 1640    PT Time Calculation (min) 40 min    Equipment Utilized During Treatment Gait belt    Activity Tolerance Patient tolerated treatment well    Behavior During Therapy WFL for tasks assessed/performed             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/15/2014   Bladder incontinence    Cancer ([place]) 2014   brain tumor, resected   Cellulitis and abscess of leg 2019   2 episodes this year   Complication of anesthesia    DJD (degenerative joint disease) of knee 2018   Fall    Fatigue    History of blood transfusion    Hypertension    MS (multiple sclerosis) ([place])    Myelitis ([place])    Neuromuscular disorder ([place]) 2001   multiple sclerosis diagnosed by MRI   PONV (postoperative nausea and vomiting)    severe   Thrombocytopenia ([place])     Past Surgical History:  Procedure Laterality Date   ABDOMINAL HYSTERECTOMY  2011   required wound vac for 6 weeks. ovaries had grown attached to her back bone   BRAIN SURGERY  2015   tumor resection-denies seizures   BREAST SURGERY Left 2014   biopsy. negative   HARDWARE REMOVAL Left 05/05/2018   Procedure: HARDWARE REMOVAL- LEFT ANKLE;  Surgeon: Hessie Knows, MD;  Location: [facility];   Service: Orthopedics;  Laterality: Left;   I & D EXTREMITY Left 08/25/2015   Procedure: IRRIGATION AND DEBRIDEMENT THUMB;  Surgeon: Iran Planas, MD;  Location: [place];  Service: Orthopedics;  Laterality: Left;   ORIF ANKLE FRACTURE Left 02/20/2018   Procedure: OPEN REDUCTION INTERNAL FIXATION (ORIF) ANKLE FRACTURE;  Surgeon: Hessie Knows, MD;  Location: [facility];  Service: Orthopedics;  Laterality: Left;    There were no vitals filed for this visit.   Subjective Assessment - 02/13/21 1652     Subjective Patient reports her knees are swollen and aching. Still having issues with swelling.  Will be following up with physician about swelling as well as changes in blood pressure    Pertinent History Patient diagnosed with MS in 2001. Past medical history significant for HTN, MS, DJD right knee, Hyperlipidemia, Thrombocytopenia, S/p ORIF Left ankle fracture.    How long can you sit comfortably? No restrictions    How long can you stand comfortably? No baseline ADL/IADL activity limited by standing tolerance    How long can you walk comfortably? today is limited by her walk to mailbox; maybe 15 min at evaluation    Currently in Pain? Yes    Pain Score 5     Pain Location Knee  Pain Orientation Right;Left    Pain Descriptors / Indicators Aching    Pain Type Chronic pain    Pain Onset 1 to 4 weeks ago    Pain Frequency Intermittent                [facility] PT Assessment - 02/13/21 0001       Functional Gait  Assessment   Gait assessed  Yes    Gait Level Surface Walks 20 ft in less than 7 sec but greater than 5.5 sec, uses assistive device, slower speed, mild gait deviations, or deviates 6-10 in outside of the 12 in walkway width.    Change in Gait Speed Able to change speed, demonstrates mild gait deviations, deviates 6-10 in outside of the 12 in walkway width, or no gait deviations, unable to achieve a major change in velocity, or uses a change in velocity, or uses an assistive device.     Gait with Horizontal Head Turns Performs head turns with moderate changes in gait velocity, slows down, deviates 10-15 in outside 12 in walkway width but recovers, can continue to walk.    Gait with Vertical Head Turns Performs task with moderate change in gait velocity, slows down, deviates 10-15 in outside 12 in walkway width but recovers, can continue to walk.    Gait and Pivot Turn Pivot turns safely in greater than 3 sec and stops with no loss of balance, or pivot turns safely within 3 sec and stops with mild imbalance, requires small steps to catch balance.    Step Over Obstacle Is able to step over one shoe box (4.5 in total height) but must slow down and adjust steps to clear box safely. May require verbal cueing.    Gait with Narrow Base of Support Ambulates 4-7 steps.    Gait with Eyes Closed Walks 20 ft, uses assistive device, slower speed, mild gait deviations, deviates 6-10 in outside 12 in walkway width. Ambulates 20 ft in less than 9 sec but greater than 7 sec.    Ambulating Backwards Walks 20 ft, uses assistive device, slower speed, mild gait deviations, deviates 6-10 in outside 12 in walkway width.    Steps Alternating feet, must use rail.    Total Score 16             RECERT goals:  BP at start of session: 161/16  FGA: 16/30 TUG 9.66 seconds with pain  FOTO: 63% SLS: L 9 seconds R 7 seconds   Other goals performed 8/17 please refer to this note for further details.    Treatment: Bilateral feet step over orange hurdle and back 10x each LE placement SUE support Bilateral feet lateral step over orange hurdle 10x each LE  Airex pad balance beam: lateral stepping no UE support 6x length  Airex balance beam: static stand and throw balls at target.  KoreBalance 6 minutes for weight shift and center of mass with perturbations. GTB hip flexion into band across // bars; 10x each LE   Patient reports her knees are swollen and aching. Still having issues with swelling.       Patient is progressing with functional mobility and stability at this time.  Previous goals addressed on 01/24/2021 as well as additional goals addressed this session.  FTN single limb stance added to goals. Patient swelling continues to limit her functional mobility as well as capacity for activities.  Patient reports she will be following up with physician due to continued deficits after changing medication again.  Patient would benefit from additional skilled PT intervention to improve strength, balance and mobility;                PT Education - 02/13/21 1655     Education Details Goals exercise technique plan of care    Person(s) Educated Patient    Methods Explanation;Demonstration;Tactile cues;Verbal cues    Comprehension Verbalized understanding;Returned demonstration;Verbal cues required;Tactile cues required              PT Short Term Goals - 02/13/21 1657       PT SHORT TERM GOAL #1   Title Pt will be independent with HEP in order to improve strength and balance in order to decrease fall risk and improve function at home and work.    Baseline 09/27/2020- Patient has no formal HEP in place, 5/19: independent; 7/12: not performing, handout is lost 8/17: depends on the day due to medication changes and pain 9/6: intermittent compliance    Time 6    Period Weeks    Status Partially Met    Target Date 03/27/21               PT Long Term Goals - 02/13/21 1657       PT LONG TERM GOAL #1   Title Pt will improve FOTO score from 61  to target score of 64 to display perceived improvements in ability to complete ADL's and functional mobility activities.    Baseline 09/27/2020: FOTO=61, 10/26/20: 64%; 7/12: 58 8/17: 55% 9/6 63%    Time 8    Period Weeks    Status Partially Met    Target Date 04/10/21      PT LONG TERM GOAL #2   Title Pt will improve DGI by at least 3 points in order to demonstrate clinically significant improvement in balance and decreased risk for  falls.    Baseline 09/27/2020-DGI=18/24, 5/19: 18/24 7/12: not tested; 7/15: 22/24    Time 8    Period Weeks    Status Achieved      PT LONG TERM GOAL #3   Title Pt will improve ABC by at least 10 % in order to demonstrate clinically significant improvement in balance confidence.    Baseline 09/27/2020-68.1%, 5/19: 78% 7/12: not assessed; 7/15: 61.25    Time 8    Period Weeks    Status Achieved      PT LONG TERM GOAL #4   Title Pt will decrease 5TSTS by at least 5 seconds (initial =23 sec with no UE support) in order to demonstrate clinically significant improvement in LE strength.    Baseline 09/27/2020= 23 sec. with No UE support, 5/19: 15 sec; 7/12: not assessed; 7/15: 17.68 sec 8/17: 13.8 seconds    Time 8    Period Weeks    Status Partially Met    Target Date 04/10/21      PT LONG TERM GOAL #5   Title Pt will decrease TUG to below 14 seconds/decrease in order to demonstrate decreased fall risk.    Baseline 09/27/2020- TUG score= 15.15 sec without UE support, 5/19: 8.89 sec; 12/19/20: not assessed; 7/15: 9.8 sec 9/6: 9.66 seconds    Time 8    Period Weeks    Status Achieved      Additional Long Term Goals   Additional Long Term Goals Yes      PT LONG TERM GOAL #6   Title Patient will increase six minute walk test distance to >1000 for progression to community  ambulator and improve gait ability    Baseline 4/26: 695 ft with one seated rest break, 5/19: 975 feet; 7/12: not safe to attempt, BP too high 8/17: 610 ft with multiple rest breaks    Time 8    Period Weeks    Status On-going    Target Date 04/10/21      PT LONG TERM GOAL #7   Title Patient will increase Functional Gait Assessment score to >22/30 as to reduce fall risk and improve dynamic gait safety with community ambulation.    Baseline 9/6: 16/30    Time 8    Period Weeks    Status New    Target Date 04/10/21      PT LONG TERM GOAL #8   Title Patient will tolerate 15 seconds of single leg stance without loss of  balance to improve ability to get in and out of shower safely.    Baseline 9/6: L 9 seconds R 7 seconds    Time 8    Period Weeks    Status New    Target Date 04/10/21                   Plan - 02/13/21 1700     Clinical Impression Statement Patient is progressing with functional mobility and stability at this time.  Previous goals addressed on 01/24/2021 as well as additional goals addressed this session.  FTN single limb stance added to goals. Patient swelling continues to limit her functional mobility as well as capacity for activities.  Patient reports she will be following up with physician due to continued deficits after changing medication again. Patient would benefit from additional skilled PT intervention to improve strength, balance and mobility;    Personal Factors and Comorbidities Comorbidity 3+    Comorbidities HTN, MS, Arthritis, COPD    Examination-Activity Limitations Bend;Carry;Continence;Lift;Squat;Stairs;Stand    Examination-Participation Restrictions Community Activity;Yard Work    Stability/Clinical Decision Making Stable/Uncomplicated    Rehab Potential Poor    PT Frequency 2x / week    PT Duration 8 weeks    PT Treatment/Interventions ADLs/Self Care Home Management;Cryotherapy;Moist Heat;DME Instruction;Gait training;Stair training;Functional mobility training;Therapeutic activities;Therapeutic exercise;Balance training;Neuromuscular re-education;Patient/family education;Manual techniques;Passive range of motion    PT Next Visit Plan Limit AMB activity if BP conitnues to go to unsafe HTN (>814GYJE systolic); review HEP for home including balance prn    PT Home Exercise Plan pt unaware of location of her original handout, has not been compliant, warrants review (12/19/20)    Consulted and Agree with Plan of Care Patient             Patient will benefit from skilled therapeutic intervention in order to improve the following deficits and impairments:   Abnormal gait, Cardiopulmonary status limiting activity, Decreased activity tolerance, Decreased balance, Decreased coordination, Decreased endurance, Decreased mobility, Decreased range of motion, Decreased strength, Difficulty walking, Impaired flexibility, Obesity, Pain  Visit Diagnosis: Other abnormalities of gait and mobility  Muscle weakness (generalized)  Unsteadiness on feet     Problem List Patient Active Problem List   Diagnosis Date Noted   Atherosclerosis of abdominal aorta ([place]) 01/10/2021   Coronary artery disease involving native coronary artery of native heart 01/10/2021   OSA (obstructive sleep apnea) 04/19/2020   Statin intolerance 01/14/2020   S/P ORIF (open reduction internal fixation) fracture 02/20/2018   DJD (degenerative joint disease) of knee 01/02/2017   Advanced care planning/counseling discussion 01/02/2017   Thrombocytopenia ([place]) 10/30/2015   Multiple sclerosis ([place])  Essential hypertension    Hyperlipidemia    Benign neoplasm of brain ([place]) 04/30/2013   Cerebral meningioma ([place]) 04/29/2013   Malignant hypertension 04/29/2013   Obesity 04/15/2013    Janna Arch, PT, DPT  02/13/2021, 5:02 PM  [place] MAIN [facility] SERVICES [address] [place], [place], [address] Phone: [phone]   Fax:  [phone]  Name: Leslie Smith MRN: [id number] Date of Birth: [birth year]

## 2021-02-15 ENCOUNTER — Encounter: Payer: Self-pay | Admitting: Physical Therapy

## 2021-02-15 ENCOUNTER — Ambulatory Visit: Payer: Medicare Other | Admitting: Physical Therapy

## 2021-02-15 ENCOUNTER — Other Ambulatory Visit: Payer: Self-pay

## 2021-02-15 VITALS — BP 145/53

## 2021-02-15 DIAGNOSIS — R2689 Other abnormalities of gait and mobility: Secondary | ICD-10-CM

## 2021-02-15 DIAGNOSIS — M6281 Muscle weakness (generalized): Secondary | ICD-10-CM

## 2021-02-15 DIAGNOSIS — R2681 Unsteadiness on feet: Secondary | ICD-10-CM

## 2021-02-15 DIAGNOSIS — R262 Difficulty in walking, not elsewhere classified: Secondary | ICD-10-CM

## 2021-02-15 NOTE — Therapy (Signed)
[place] [facility] SERVICES [address] [place], [place], [address] Phone: [phone]   Fax:  [phone]  Physical Therapy Treatment  Patient Details  Name: Leslie Smith MRN: [id number] Date of Birth: Oct 11, 1950 Referring Provider (PT): Dr. Darlin Priestly   Encounter Date: 02/15/2021   PT End of Session - 02/15/21 1022     Visit Number 25    Number of Visits 40    Date for PT Re-Evaluation 04/10/21    Authorization Type Medicare A&B; Medicaid 2nd; VL review after 25 visits    Authorization Time Period 3/47/42-10/17/54; previous cert 3/87/5643-09/06/5186    Authorization - Visit Number 49    Authorization - Number of Visits 25    PT Start Time 4166    PT Stop Time 1100    PT Time Calculation (min) 42 min    Equipment Utilized During Treatment Gait belt    Activity Tolerance Patient tolerated treatment well    Behavior During Therapy WFL for tasks assessed/performed             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   Bladder incontinence    Cancer ([place]) 2014   brain tumor, resected   Cellulitis and abscess of leg 2019   2 episodes this year   Complication of anesthesia    DJD (degenerative joint disease) of knee 2018   Fall    Fatigue    History of blood transfusion    Hypertension    MS (multiple sclerosis) ([place])    Myelitis ([place])    Neuromuscular disorder ([place]) 2001   multiple sclerosis diagnosed by MRI   PONV (postoperative nausea and vomiting)    severe   Thrombocytopenia ([place])     Past Surgical History:  Procedure Laterality Date   ABDOMINAL HYSTERECTOMY  2011   required wound vac for 6 weeks. ovaries had grown attached to her back bone   BRAIN SURGERY  2015   tumor resection-denies seizures   BREAST SURGERY Left 2014   biopsy. negative   HARDWARE REMOVAL Left 05/05/2018   Procedure: HARDWARE REMOVAL- LEFT ANKLE;  Surgeon: Hessie Knows, MD;  Location: [facility];  Service:  Orthopedics;  Laterality: Left;   I & D EXTREMITY Left 08/25/2015   Procedure: IRRIGATION AND DEBRIDEMENT THUMB;  Surgeon: Iran Planas, MD;  Location: [facility];  Service: Orthopedics;  Laterality: Left;   ORIF ANKLE FRACTURE Left 02/20/2018   Procedure: OPEN REDUCTION INTERNAL FIXATION (ORIF) ANKLE FRACTURE;  Surgeon: Hessie Knows, MD;  Location: [facility];  Service: Orthopedics;  Laterality: Left;    Vitals:   02/15/21 1019  BP: (!) 145/53     Subjective Assessment - 02/15/21 1019     Subjective Patient reports increased pain in bilateral knee which is likely from the wet weather. She reports mowing yesterday and is just thankful she didn't kill herself. She reports mowing 3 yards in 2 hours and is feeling it today;    Pertinent History Patient diagnosed with MS in 2001. Past medical history significant for HTN, MS, DJD right knee, Hyperlipidemia, Thrombocytopenia, S/p ORIF Left ankle fracture.    How long can you sit comfortably? No restrictions    How long can you stand comfortably? No baseline ADL/IADL activity limited by standing tolerance    How long can you walk comfortably? today is limited by her walk to mailbox; maybe 15 min at evaluation    Currently in Pain? Yes  Pain Score 7     Pain Location Knee    Pain Orientation Right;Left    Pain Descriptors / Indicators Aching;Sore    Pain Type Chronic pain    Pain Onset 1 to 4 weeks ago    Pain Frequency Intermittent    Aggravating Factors  worse with standing/walking    Pain Relieving Factors rest/pain meds    Effect of Pain on Daily Activities decreased activity tolerance;    Multiple Pain Sites No                  TREATMENT: Instructed patient in LE strengthening;  Standing with 2# ankle weight: -hip flexion march x15 reps each -hip abduction SLR x15 reps each LE -hip extension SLR x15 reps each LE; -BLE heel raises x15 reps Patient required min-moderate verbal/tactile cues for correct exercise  technique. Utilized ankle weights to reduce discomfort with swelling in BLE; Patient used rail assist intermittent for balance and stance control;   Seated: LAQ with ankle DF x15 reps each LE with cues to increase ankle DF for better stretch in calf;     Neuro Re-ed: CGA and cues for sequencing and safety awareness Standing on airex: One foot on airex, one foot on 6 inch step:  Unsupported standing 15 sec hold x1 rep each  Unsupported head turns side/side x5 reps each foot on step  Unsupported holding ball in BUE arms up/down with following ball x5 reps  Modified tandem stance on airex pad:  Unsupported standing 10 sec hold x1 rep each  Unsupported standing head turns side/side x5 reps each foot in front;   Utilized red large mat: Forward/backward walking firm surface over red mat and then to firm surface x3 laps unsupported, required min A when backwards walking for improved safety  Side stepping firm over red mat and then firm surface x3 laps each direction unsupported to challenge dynamic balance with surface change  Weaving on red mat around stepping stones #3 x4 laps each, unsupported with min A for safety;    Vitals after balance; : Spo2 96%, HR 87 bpm   Patient tolerated fair. She does report mild fatigue at end of session. She did have increased difficulty with reduced rail assist while on compliant surface. She also exhibits increased instability while on red mat with backward walking and with turns requiring min A for safety; Provided short seated rest breaks due to increased knee pain with weight bearing.                         PT Education - 02/15/21 1022     Education Details ROM/strengthening, balance;    Person(s) Educated Patient    Methods Explanation;Verbal cues    Comprehension Verbalized understanding;Returned demonstration;Verbal cues required;Need further instruction              PT Short Term Goals - 02/13/21 1657       PT SHORT  TERM GOAL #1   Title Pt will be independent with HEP in order to improve strength and balance in order to decrease fall risk and improve function at home and work.    Baseline 09/27/2020- Patient has no formal HEP in place, 5/19: independent; 7/12: not performing, handout is lost 8/17: depends on the day due to medication changes and pain 9/6: intermittent compliance    Time 6    Period Weeks    Status Partially Met    Target Date 03/27/21  PT Long Term Goals - 02/13/21 1657       PT LONG TERM GOAL #1   Title Pt will improve FOTO score from 61  to target score of 64 to display perceived improvements in ability to complete ADL's and functional mobility activities.    Baseline 09/27/2020: FOTO=61, 10/26/20: 64%; 7/12: 58 8/17: 55% 9/6 63%    Time 8    Period Weeks    Status Partially Met    Target Date 04/10/21      PT LONG TERM GOAL #2   Title Pt will improve DGI by at least 3 points in order to demonstrate clinically significant improvement in balance and decreased risk for falls.    Baseline 09/27/2020-DGI=18/24, 5/19: 18/24 7/12: not tested; 7/15: 22/24    Time 8    Period Weeks    Status Achieved      PT LONG TERM GOAL #3   Title Pt will improve ABC by at least 10 % in order to demonstrate clinically significant improvement in balance confidence.    Baseline 09/27/2020-68.1%, 5/19: 78% 7/12: not assessed; 7/15: 61.25    Time 8    Period Weeks    Status Achieved      PT LONG TERM GOAL #4   Title Pt will decrease 5TSTS by at least 5 seconds (initial =23 sec with no UE support) in order to demonstrate clinically significant improvement in LE strength.    Baseline 09/27/2020= 23 sec. with No UE support, 5/19: 15 sec; 7/12: not assessed; 7/15: 17.68 sec 8/17: 13.8 seconds    Time 8    Period Weeks    Status Partially Met    Target Date 04/10/21      PT LONG TERM GOAL #5   Title Pt will decrease TUG to below 14 seconds/decrease in order to demonstrate decreased  fall risk.    Baseline 09/27/2020- TUG score= 15.15 sec without UE support, 5/19: 8.89 sec; 12/19/20: not assessed; 7/15: 9.8 sec 9/6: 9.66 seconds    Time 8    Period Weeks    Status Achieved      Additional Long Term Goals   Additional Long Term Goals Yes      PT LONG TERM GOAL #6   Title Patient will increase six minute walk test distance to >1000 for progression to community ambulator and improve gait ability    Baseline 4/26: 695 ft with one seated rest break, 5/19: 975 feet; 7/12: not safe to attempt, BP too high 8/17: 610 ft with multiple rest breaks    Time 8    Period Weeks    Status On-going    Target Date 04/10/21      PT LONG TERM GOAL #7   Title Patient will increase Functional Gait Assessment score to >22/30 as to reduce fall risk and improve dynamic gait safety with community ambulation.    Baseline 9/6: 16/30    Time 8    Period Weeks    Status New    Target Date 04/10/21      PT LONG TERM GOAL #8   Title Patient will tolerate 15 seconds of single leg stance without loss of balance to improve ability to get in and out of shower safely.    Baseline 9/6: L 9 seconds R 7 seconds    Time 8    Period Weeks    Status New    Target Date 04/10/21  Plan - 02/15/21 1151     Clinical Impression Statement Patient motivated and participated fair within session. She reports increased knee pain this session which limited standing tolerance. Patient states, "Lets just do what you want to do and I'll push through. Its going to hurt regardless". She reports having steroid injections in the past and states that she will follow back up with MD as she feels as though its time to get more injections. Patient instructed in advanced LE strengthening. She does require min VCs for proper exercise technique. She does utilized rail assist due to increased knee pain with weight bearing. PT instructed patient in advanced balance tasks. Utilized compliant surfaces to  challenge stance control. Patient had difficulty with dynamic balance on mat with increased unsteadiness noted, especially with backward walking and turning. She would benefit from additional skilled PT intervention to improve strength, balance and mobility;    Personal Factors and Comorbidities Comorbidity 3+    Comorbidities HTN, MS, Arthritis, COPD    Examination-Activity Limitations Bend;Carry;Continence;Lift;Squat;Stairs;Stand    Examination-Participation Restrictions Community Activity;Yard Work    Stability/Clinical Decision Making Stable/Uncomplicated    Rehab Potential Poor    PT Frequency 2x / week    PT Duration 8 weeks    PT Treatment/Interventions ADLs/Self Care Home Management;Cryotherapy;Moist Heat;DME Instruction;Gait training;Stair training;Functional mobility training;Therapeutic activities;Therapeutic exercise;Balance training;Neuromuscular re-education;Patient/family education;Manual techniques;Passive range of motion    PT Next Visit Plan Limit AMB activity if BP conitnues to go to unsafe HTN (>381WEXH systolic); review HEP for home including balance prn    PT Home Exercise Plan pt unaware of location of her original handout, has not been compliant, warrants review (12/19/20)    Consulted and Agree with Plan of Care Patient             Patient will benefit from skilled therapeutic intervention in order to improve the following deficits and impairments:  Abnormal gait, Cardiopulmonary status limiting activity, Decreased activity tolerance, Decreased balance, Decreased coordination, Decreased endurance, Decreased mobility, Decreased range of motion, Decreased strength, Difficulty walking, Impaired flexibility, Obesity, Pain  Visit Diagnosis: Other abnormalities of gait and mobility  Muscle weakness (generalized)  Unsteadiness on feet  Difficulty in walking, not elsewhere classified     Problem List Patient Active Problem List   Diagnosis Date Noted    Atherosclerosis of abdominal aorta ([place]) 01/10/2021   Coronary artery disease involving native coronary artery of native heart 01/10/2021   OSA (obstructive sleep apnea) 04/19/2020   Statin intolerance 01/14/2020   S/P ORIF (open reduction internal fixation) fracture 02/20/2018   DJD (degenerative joint disease) of knee 01/02/2017   Advanced care planning/counseling discussion 01/02/2017   Thrombocytopenia ([place]) 10/30/2015   Multiple sclerosis ([place])    Essential hypertension    Hyperlipidemia    Benign neoplasm of brain ([place]) 04/30/2013   Cerebral meningioma ([place]) 04/29/2013   Malignant hypertension 04/29/2013   Obesity 04/15/2013    Kayloni Rocco, PT, DPT 02/15/2021, 11:55 AM  [facility] [address] [place], [place], [address] Phone: [phone]   Fax:  [phone]  Name: Leslie Smith MRN: [id number] Date of Birth: [birth year]

## 2021-02-19 ENCOUNTER — Other Ambulatory Visit: Payer: Self-pay

## 2021-02-19 ENCOUNTER — Ambulatory Visit: Payer: Medicare Other | Admitting: Physical Therapy

## 2021-02-19 ENCOUNTER — Encounter: Payer: Self-pay | Admitting: Physical Therapy

## 2021-02-19 DIAGNOSIS — R269 Unspecified abnormalities of gait and mobility: Secondary | ICD-10-CM

## 2021-02-19 DIAGNOSIS — R2689 Other abnormalities of gait and mobility: Secondary | ICD-10-CM | POA: Diagnosis not present

## 2021-02-19 DIAGNOSIS — R278 Other lack of coordination: Secondary | ICD-10-CM

## 2021-02-19 DIAGNOSIS — M6281 Muscle weakness (generalized): Secondary | ICD-10-CM

## 2021-02-19 DIAGNOSIS — R2681 Unsteadiness on feet: Secondary | ICD-10-CM

## 2021-02-19 DIAGNOSIS — R262 Difficulty in walking, not elsewhere classified: Secondary | ICD-10-CM

## 2021-02-19 NOTE — Therapy (Signed)
[place] [facility] MAIN [facility] SERVICES [address] [place], [place], [address] Phone: [phone]   Fax:  [phone]  Physical Therapy Treatment  Patient Details  Name: Leslie Smith MRN: [id number] Date of Birth: [birth year] Referring Provider (PT): Dr. Marian Sorrow   Encounter Date: 02/19/2021   PT End of Session - 02/19/21 1257     Visit Number 26    Number of Visits 40    Date for PT Re-Evaluation 04/10/21    Authorization Type Medicare A&B; Medicaid 2nd; VL review after 25 visits    Authorization Time Period 12/19/20-02/13/21; previous cert 12/19/1973-8/83/2549    Authorization - Visit Number 20    Authorization - Number of Visits 25    PT Start Time 1302    PT Stop Time 1345    PT Time Calculation (min) 43 min    Equipment Utilized During Treatment Gait belt    Activity Tolerance Patient tolerated treatment well    Behavior During Therapy WFL for tasks assessed/performed             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 ovaries had grown attached to her back bone   BRAIN SURGERY  2015   tumor resection-denies seizures   BREAST SURGERY Left 2014   biopsy. negative   HARDWARE REMOVAL Left 05/05/2018   Procedure: HARDWARE REMOVAL- LEFT ANKLE;  Surgeon: Kennedy Bucker, MD;  Location: [facility] ORS;  Service:  Orthopedics;  Laterality: Left;   I & D EXTREMITY Left 08/25/2015   Procedure: IRRIGATION AND DEBRIDEMENT THUMB;  Surgeon: Bradly Bienenstock, MD;  Location: [facility] OR;  Service: Orthopedics;  Laterality: Left;   ORIF ANKLE FRACTURE Left 02/20/2018   Procedure: OPEN REDUCTION INTERNAL FIXATION (ORIF) ANKLE FRACTURE;  Surgeon: Kennedy Bucker, MD;  Location: [facility] ORS;  Service: Orthopedics;  Laterality: Left;    There were no vitals filed for this visit.   Subjective Assessment - 02/19/21 1312     Subjective Patient reports intermittent knee pain. She did call orthopedic doctor and is scheduled to get her steroid injection on 9/30; Patient denies any new falls or other new complaints.    Pertinent History Patient diagnosed with MS in 2001. Past medical history significant for HTN, MS, DJD right knee, Hyperlipidemia, Thrombocytopenia, S/p ORIF Left ankle fracture.    How long can you sit comfortably? No restrictions    How long can you stand comfortably? No baseline ADL/IADL activity limited by standing tolerance    How long can you walk comfortably? today is limited by her walk to mailbox; maybe 15 min at evaluation    Currently in Pain? Yes    Pain Score 5     Pain Location Knee  Pain Orientation Right    Pain Descriptors / Indicators Aching;Sore    Pain Type Chronic pain    Pain Onset 1 to 4 weeks ago    Pain Frequency Intermittent    Aggravating Factors  worse with standing/walking    Pain Relieving Factors rest/pain meds    Effect of Pain on Daily Activities decreased activity tolerance;    Multiple Pain Sites No                   TREATMENT: BP 150/62, HR 71  Ascend/descend 4 steps with rail assist, forward non-reciprocal x2 sets, favoring RLE; Patient is able to complete with close supervision;   Instructed patient in LE strengthening;  Standing with 3# ankle weight: -hip flexion march x15 reps each -hip abduction SLR x15 reps each LE -BLE heel raises x15 reps Patient  required min-moderate verbal/tactile cues for correct exercise technique. Utilized ankle weights to reduce discomfort with swelling in BLE; Patient used rail assist intermittent for balance and stance control;    Seated: LAQ with ankle DF x15 reps each LE with cues to increase ankle DF for better stretch in calf;      Neuro Re-ed: CGA and cues for sequencing and safety awareness Standing on 1/2 bolster (round side down) -heel/toe rock unsupported x10 reps; -unsupported standing, neutral stance, head turns side/side x30 sec, up/down x30 sec -tandem stance, 1-0 rail assist 15 sec hold x2 reps each foot in front Patient required min VCs for balance stability, including to increase trunk control for less loss of balance with smaller base of support She required CGA to min A when standing unsupported; She does exhibit increased unsteadiness when standing in tandem with less lateral control;       Patient tolerated fair. She does report mild fatigue at end of session. She continues to have intermittent knee pain which limits activity. She also gets short of breath with prolonged standing requiring short seated rest breaks.                            PT Education - 02/19/21 1257     Education Details ROM/strengthening, balance, gait safety;    Person(s) Educated Patient    Methods Explanation;Verbal cues    Comprehension Verbalized understanding;Returned demonstration;Verbal cues required;Need further instruction              PT Short Term Goals - 02/13/21 1657       PT SHORT TERM GOAL #1   Title Pt will be independent with HEP in order to improve strength and balance in order to decrease fall risk and improve function at home and work.    Baseline 09/27/2020- Patient has no formal HEP in place, 5/19: independent; 7/12: not performing, handout is lost 8/17: depends on the day due to medication changes and pain 9/6: intermittent compliance    Time 6    Period  Weeks    Status Partially Met    Target Date 03/27/21               PT Long Term Goals - 02/13/21 1657       PT LONG TERM GOAL #1   Title Pt will improve FOTO score from 61  to target score of 64 to display perceived improvements in ability to complete ADL's and functional mobility activities.    Baseline 09/27/2020: FOTO=61, 10/26/20: 64%; 7/12: 58 8/17: 55% 9/6 63%    Time 8  Period Weeks    Status Partially Met    Target Date 04/10/21      PT LONG TERM GOAL #2   Title Pt will improve DGI by at least 3 points in order to demonstrate clinically significant improvement in balance and decreased risk for falls.    Baseline 09/27/2020-DGI=18/24, 5/19: 18/24 7/12: not tested; 7/15: 22/24    Time 8    Period Weeks    Status Achieved      PT LONG TERM GOAL #3   Title Pt will improve ABC by at least 10 % in order to demonstrate clinically significant improvement in balance confidence.    Baseline 09/27/2020-68.1%, 5/19: 78% 7/12: not assessed; 7/15: 61.25    Time 8    Period Weeks    Status Achieved      PT LONG TERM GOAL #4   Title Pt will decrease 5TSTS by at least 5 seconds (initial =23 sec with no UE support) in order to demonstrate clinically significant improvement in LE strength.    Baseline 09/27/2020= 23 sec. with No UE support, 5/19: 15 sec; 7/12: not assessed; 7/15: 17.68 sec 8/17: 13.8 seconds    Time 8    Period Weeks    Status Partially Met    Target Date 04/10/21      PT LONG TERM GOAL #5   Title Pt will decrease TUG to below 14 seconds/decrease in order to demonstrate decreased fall risk.    Baseline 09/27/2020- TUG score= 15.15 sec without UE support, 5/19: 8.89 sec; 12/19/20: not assessed; 7/15: 9.8 sec 9/6: 9.66 seconds    Time 8    Period Weeks    Status Achieved      Additional Long Term Goals   Additional Long Term Goals Yes      PT LONG TERM GOAL #6   Title Patient will increase six minute walk test distance to >1000 for progression to community  ambulator and improve gait ability    Baseline 4/26: 695 ft with one seated rest break, 5/19: 975 feet; 7/12: not safe to attempt, BP too high 8/17: 610 ft with multiple rest breaks    Time 8    Period Weeks    Status On-going    Target Date 04/10/21      PT LONG TERM GOAL #7   Title Patient will increase Functional Gait Assessment score to >22/30 as to reduce fall risk and improve dynamic gait safety with community ambulation.    Baseline 9/6: 16/30    Time 8    Period Weeks    Status New    Target Date 04/10/21      PT LONG TERM GOAL #8   Title Patient will tolerate 15 seconds of single leg stance without loss of balance to improve ability to get in and out of shower safely.    Baseline 9/6: L 9 seconds R 7 seconds    Time 8    Period Weeks    Status New    Target Date 04/10/21                   Plan - 02/19/21 1426     Clinical Impression Statement Patient motivated and participated well within session. She continues to have LE weakness requiring cues for proper exercise technique. Patient's standing tolerance limited by intermittent knee pain. She is scheduled to have injections in a few weeks. Patient instructed in static balance tasks utilizing compliant surface to challenge stance control. She does have  increased difficulty with narrow base of support with decreased ankle strategies noted. Patient's session limited with therapist helping her determine if she could have her dog come to the hospital for final certification for being a support animal. She would benefit from additional skilled PT intervention to improve strength, balance and mobility;    Personal Factors and Comorbidities Comorbidity 3+    Comorbidities HTN, MS, Arthritis, COPD    Examination-Activity Limitations Bend;Carry;Continence;Lift;Squat;Stairs;Stand    Examination-Participation Restrictions Community Activity;Yard Work    Stability/Clinical Decision Making Stable/Uncomplicated    Rehab Potential  Poor    PT Frequency 2x / week    PT Duration 8 weeks    PT Treatment/Interventions ADLs/Self Care Home Management;Cryotherapy;Moist Heat;DME Instruction;Gait training;Stair training;Functional mobility training;Therapeutic activities;Therapeutic exercise;Balance training;Neuromuscular re-education;Patient/family education;Manual techniques;Passive range of motion    PT Next Visit Plan Limit AMB activity if BP conitnues to go to unsafe HTN (>[id number] systolic); review HEP for home including balance prn    PT Home Exercise Plan pt unaware of location of her original handout, has not been compliant, warrants review (12/19/20)    Consulted and Agree with Plan of Care Patient             Patient will benefit from skilled therapeutic intervention in order to improve the following deficits and impairments:  Abnormal gait, Cardiopulmonary status limiting activity, Decreased activity tolerance, Decreased balance, Decreased coordination, Decreased endurance, Decreased mobility, Decreased range of motion, Decreased strength, Difficulty walking, Impaired flexibility, Obesity, Pain  Visit Diagnosis: Other abnormalities of gait and mobility  Muscle weakness (generalized)  Unsteadiness on feet  Difficulty in walking, not elsewhere classified  Abnormality of gait and mobility  Other lack of coordination     Problem List Patient Active Problem List   Diagnosis Date Noted   Atherosclerosis of abdominal aorta ([place]) 01/10/2021   Coronary artery disease involving native coronary artery of native heart 01/10/2021   OSA (obstructive sleep apnea) 04/19/2020   Statin intolerance 01/14/2020   S/P ORIF (open reduction internal fixation) fracture 02/20/2018   DJD (degenerative joint disease) of knee 01/02/2017   Advanced care planning/counseling discussion 01/02/2017   Thrombocytopenia ([place]) 10/30/2015   Multiple sclerosis ([place])    Essential hypertension    Hyperlipidemia    Benign neoplasm of brain  ([place]) 04/30/2013   Cerebral meningioma ([place]) 04/29/2013   Malignant hypertension 04/29/2013   Obesity 04/15/2013    Earon Rivest, PT, DPT 02/19/2021, 2:29 PM  [place] MAIN [facility] SERVICES [address] [place], [place], [address] Phone: [phone]   Fax:  [phone]  Name: MIKALA PODOLL MRN: [id number] Date of Birth: [birth year]

## 2021-02-21 ENCOUNTER — Encounter: Payer: Self-pay | Admitting: Physical Therapy

## 2021-02-21 ENCOUNTER — Ambulatory Visit: Payer: Medicare Other | Admitting: Physical Therapy

## 2021-02-21 ENCOUNTER — Other Ambulatory Visit: Payer: Self-pay

## 2021-02-21 VITALS — BP 125/75 | HR 68

## 2021-02-21 DIAGNOSIS — R2681 Unsteadiness on feet: Secondary | ICD-10-CM

## 2021-02-21 DIAGNOSIS — R2689 Other abnormalities of gait and mobility: Secondary | ICD-10-CM

## 2021-02-21 DIAGNOSIS — R262 Difficulty in walking, not elsewhere classified: Secondary | ICD-10-CM

## 2021-02-21 DIAGNOSIS — M6281 Muscle weakness (generalized): Secondary | ICD-10-CM

## 2021-02-21 NOTE — Therapy (Signed)
[place] MAIN [facility] SERVICES [address] [place], [place], [address] Phone: [phone]   Fax:  [phone]  Physical Therapy Treatment  Patient Details  Name: Leslie Smith MRN: [id number] Date of Birth: 1950/07/14 Referring Provider (PT): Dr. Darlin Priestly   Encounter Date: 02/21/2021   PT End of Session - 02/21/21 1156     Visit Number 27    Number of Visits 40    Date for PT Re-Evaluation 04/10/21    Authorization Type Medicare A&B; Medicaid 2nd; VL review after 25 visits    Authorization Time Period 10/12/37-12/14/71; previous cert 09/26/3788-2/40/9735    Authorization - Visit Number 70    Authorization - Number of Visits 25    PT Start Time 1146    PT Stop Time 1230    PT Time Calculation (min) 44 min    Equipment Utilized During Treatment Gait belt    Activity Tolerance Patient tolerated treatment well    Behavior During Therapy WFL for tasks assessed/performed             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[place]) 09/15/2014   Bladder incontinence    Cancer ([place]) 2014   brain tumor, resected   Cellulitis and abscess of leg 2019   2 episodes this year   Complication of anesthesia    DJD (degenerative joint disease) of knee 2018   Fall    Fatigue    History of blood transfusion    Hypertension    MS (multiple sclerosis) ([place])    Myelitis ([place])    Neuromuscular disorder ([place]) 2001   multiple sclerosis diagnosed by MRI   PONV (postoperative nausea and vomiting)    severe   Thrombocytopenia ([place])     Past Surgical History:  Procedure Laterality Date   ABDOMINAL HYSTERECTOMY  2011   required wound vac for 6 weeks. ovaries had grown attached to her back bone   BRAIN SURGERY  2015   tumor resection-denies seizures   BREAST SURGERY Left 2014   biopsy. negative   HARDWARE REMOVAL Left 05/05/2018   Procedure: HARDWARE REMOVAL- LEFT ANKLE;  Surgeon: Hessie Knows, MD;  Location: [facility] ORS;  Service:  Orthopedics;  Laterality: Left;   I & D EXTREMITY Left 08/25/2015   Procedure: IRRIGATION AND DEBRIDEMENT THUMB;  Surgeon: Iran Planas, MD;  Location: [place];  Service: Orthopedics;  Laterality: Left;   ORIF ANKLE FRACTURE Left 02/20/2018   Procedure: OPEN REDUCTION INTERNAL FIXATION (ORIF) ANKLE FRACTURE;  Surgeon: Hessie Knows, MD;  Location: [facility] ORS;  Service: Orthopedics;  Laterality: Left;    Vitals:   02/21/21 1157  BP: 125/75  Pulse: 68     Subjective Assessment - 02/21/21 1154     Subjective Patient reports increased fatigue after working on stairs with her service dog. She reports increased knee pain after working on stairs;    Pertinent History Patient diagnosed with MS in 2001. Past medical history significant for HTN, MS, DJD right knee, Hyperlipidemia, Thrombocytopenia, S/p ORIF Left ankle fracture.    How long can you sit comfortably? No restrictions    How long can you stand comfortably? No baseline ADL/IADL activity limited by standing tolerance    How long can you walk comfortably? today is limited by her walk to mailbox; maybe 15 min at evaluation    Currently in Pain? Yes    Pain Score 4     Pain Location Knee    Pain Orientation  Right    Pain Descriptors / Indicators Aching;Sore    Pain Type Chronic pain    Pain Onset 1 to 4 weeks ago    Pain Frequency Intermittent    Aggravating Factors  worse with standing/walking    Pain Relieving Factors rest/pain meds    Effect of Pain on Daily Activities decreased activity tolerance;    Multiple Pain Sites No                 TREATMENT: Instructed patient in LE strengthening;  Standing with 3# ankle weight: -hip flexion march x15 reps each -hip abduction SLR x15 reps each LE -hip extension SLR x15 reps each LE; -BLE heel raises x15 reps Patient required min-moderate verbal/tactile cues for correct exercise technique. Utilized ankle weights to reduce discomfort with swelling in BLE; Patient used rail assist  intermittent for balance and stance control;    Seated: LAQ with ankle DF x15 reps each LE with cues to increase ankle DF for better stretch in calf;      Neuro Re-ed: CGA and cues for sequencing and safety awareness Utilized red large mat: Forward/backward walking firm surface over red mat and then to firm surface x3 laps unsupported, required min A when backwards walking for improved safety  PT placed stepping stone on red mat:  Forward/backward diagonal stepping firm surface, red mat to firm surface using stepping stone as targets x3 laps each with CGA to min A for safety;  Standing on 1/2 bolster (Round side up) -toes on bolster, heel raises x15 reps -heels on bolster, toe raises x15 reps (Flat side up) Tandem stance with 1-0 rail assist x10 sec hold x2 reps each foot in front; Patient exhibits increased ankle instability requiring CGA to min A for balance control;    Patient tolerated fair. She does report mild fatigue at end of session. She did have increased difficulty with reduced rail assist while on compliant surface. She also exhibits increased instability while on red mat with backward walking and with turns requiring min A for safety; Provided short seated rest breaks due to increased knee pain with weight bearing. She did report catch in right knee/slight buckle when stepping off 1/2 bolster at end of session;                              PT Education - 02/21/21 1156     Education Details ROM/strengthening, balance, gait safety;    Person(s) Educated Patient    Methods Explanation;Verbal cues    Comprehension Verbalized understanding;Returned demonstration;Verbal cues required;Need further instruction              PT Short Term Goals - 02/13/21 1657       PT SHORT TERM GOAL #1   Title Pt will be independent with HEP in order to improve strength and balance in order to decrease fall risk and improve function at home and work.    Baseline  09/27/2020- Patient has no formal HEP in place, 5/19: independent; 7/12: not performing, handout is lost 8/17: depends on the day due to medication changes and pain 9/6: intermittent compliance    Time 6    Period Weeks    Status Partially Met    Target Date 03/27/21               PT Long Term Goals - 02/13/21 1657       PT LONG TERM GOAL #1  Title Pt will improve FOTO score from 61  to target score of 64 to display perceived improvements in ability to complete ADL's and functional mobility activities.    Baseline 09/27/2020: FOTO=61, 10/26/20: 64%; 7/12: 58 8/17: 55% 9/6 63%    Time 8    Period Weeks    Status Partially Met    Target Date 04/10/21      PT LONG TERM GOAL #2   Title Pt will improve DGI by at least 3 points in order to demonstrate clinically significant improvement in balance and decreased risk for falls.    Baseline 09/27/2020-DGI=18/24, 5/19: 18/24 7/12: not tested; 7/15: 22/24    Time 8    Period Weeks    Status Achieved      PT LONG TERM GOAL #3   Title Pt will improve ABC by at least 10 % in order to demonstrate clinically significant improvement in balance confidence.    Baseline 09/27/2020-68.1%, 5/19: 78% 7/12: not assessed; 7/15: 61.25    Time 8    Period Weeks    Status Achieved      PT LONG TERM GOAL #4   Title Pt will decrease 5TSTS by at least 5 seconds (initial =23 sec with no UE support) in order to demonstrate clinically significant improvement in LE strength.    Baseline 09/27/2020= 23 sec. with No UE support, 5/19: 15 sec; 7/12: not assessed; 7/15: 17.68 sec 8/17: 13.8 seconds    Time 8    Period Weeks    Status Partially Met    Target Date 04/10/21      PT LONG TERM GOAL #5   Title Pt will decrease TUG to below 14 seconds/decrease in order to demonstrate decreased fall risk.    Baseline 09/27/2020- TUG score= 15.15 sec without UE support, 5/19: 8.89 sec; 12/19/20: not assessed; 7/15: 9.8 sec 9/6: 9.66 seconds    Time 8    Period Weeks     Status Achieved      Additional Long Term Goals   Additional Long Term Goals Yes      PT LONG TERM GOAL #6   Title Patient will increase six minute walk test distance to >1000 for progression to community ambulator and improve gait ability    Baseline 4/26: 695 ft with one seated rest break, 5/19: 975 feet; 7/12: not safe to attempt, BP too high 8/17: 610 ft with multiple rest breaks    Time 8    Period Weeks    Status On-going    Target Date 04/10/21      PT LONG TERM GOAL #7   Title Patient will increase Functional Gait Assessment score to >22/30 as to reduce fall risk and improve dynamic gait safety with community ambulation.    Baseline 9/6: 16/30    Time 8    Period Weeks    Status New    Target Date 04/10/21      PT LONG TERM GOAL #8   Title Patient will tolerate 15 seconds of single leg stance without loss of balance to improve ability to get in and out of shower safely.    Baseline 9/6: L 9 seconds R 7 seconds    Time 8    Period Weeks    Status New    Target Date 04/10/21                   Plan - 02/21/21 1318     Clinical Impression Statement Patient motivated and participated  well within session. Progressed LE strengthening utilizing 3# ankle weight to improve LE strength. Patient does require min VCS for proper positioning for optimal strengthening. She denies any increase in pain with advanced exercise. Patient's vitals were improved today and she reports less shortness of breath with activity overall. Patient was instructed in advanced balance tasks. utilized red mat to challenge stance control with dynamic tasks. Patient does have difficulty with posterior ambulation especially on compliant surface. Patient continues to have decreased ankle strategies which limits balance recovery when on compliant surfaces, requiring CGA to min A for safety. She would benefit from additional skilled PT intervention to improve strength, balance and mobility;    Personal  Factors and Comorbidities Comorbidity 3+    Comorbidities HTN, MS, Arthritis, COPD    Examination-Activity Limitations Bend;Carry;Continence;Lift;Squat;Stairs;Stand    Examination-Participation Restrictions Community Activity;Yard Work    Stability/Clinical Decision Making Stable/Uncomplicated    Rehab Potential Poor    PT Frequency 2x / week    PT Duration 8 weeks    PT Treatment/Interventions ADLs/Self Care Home Management;Cryotherapy;Moist Heat;DME Instruction;Gait training;Stair training;Functional mobility training;Therapeutic activities;Therapeutic exercise;Balance training;Neuromuscular re-education;Patient/family education;Manual techniques;Passive range of motion    PT Next Visit Plan Limit AMB activity if BP conitnues to go to unsafe HTN (>524XBPQ systolic); review HEP for home including balance prn    PT Home Exercise Plan pt unaware of location of her original handout, has not been compliant, warrants review (12/19/20)    Consulted and Agree with Plan of Care Patient             Patient will benefit from skilled therapeutic intervention in order to improve the following deficits and impairments:  Abnormal gait, Cardiopulmonary status limiting activity, Decreased activity tolerance, Decreased balance, Decreased coordination, Decreased endurance, Decreased mobility, Decreased range of motion, Decreased strength, Difficulty walking, Impaired flexibility, Obesity, Pain  Visit Diagnosis: Other abnormalities of gait and mobility  Muscle weakness (generalized)  Unsteadiness on feet  Difficulty in walking, not elsewhere classified     Problem List Patient Active Problem List   Diagnosis Date Noted   Atherosclerosis of abdominal aorta ([place]) 01/10/2021   Coronary artery disease involving native coronary artery of native heart 01/10/2021   OSA (obstructive sleep apnea) 04/19/2020   Statin intolerance 01/14/2020   S/P ORIF (open reduction internal fixation) fracture 02/20/2018    DJD (degenerative joint disease) of knee 01/02/2017   Advanced care planning/counseling discussion 01/02/2017   Thrombocytopenia ([place]) 10/30/2015   Multiple sclerosis ([place])    Essential hypertension    Hyperlipidemia    Benign neoplasm of brain ([place]) 04/30/2013   Cerebral meningioma ([place]) 04/29/2013   Malignant hypertension 04/29/2013   Obesity 04/15/2013    Zenas Santa, PT, DPT 02/21/2021, 1:22 PM  [place] [address] [place], [place], [address] Phone: [phone]   Fax:  [phone]  Name: Leslie Smith MRN: [id number] Date of Birth: [birth year]

## 2021-02-26 ENCOUNTER — Other Ambulatory Visit: Payer: Self-pay

## 2021-02-26 ENCOUNTER — Encounter: Payer: Self-pay | Admitting: Physical Therapy

## 2021-02-26 ENCOUNTER — Ambulatory Visit: Payer: Medicare Other | Admitting: Physical Therapy

## 2021-02-26 VITALS — BP 121/54 | HR 57

## 2021-02-26 DIAGNOSIS — R262 Difficulty in walking, not elsewhere classified: Secondary | ICD-10-CM

## 2021-02-26 DIAGNOSIS — R2689 Other abnormalities of gait and mobility: Secondary | ICD-10-CM | POA: Diagnosis not present

## 2021-02-26 DIAGNOSIS — R2681 Unsteadiness on feet: Secondary | ICD-10-CM

## 2021-02-26 DIAGNOSIS — M6281 Muscle weakness (generalized): Secondary | ICD-10-CM

## 2021-02-26 NOTE — Therapy (Signed)
[facility] MAIN [facility] SERVICES [address] [place], [place], [address] Phone: [phone]   Fax:  [phone]  Physical Therapy Treatment  Patient Details  Name: Leslie Smith MRN: [id number] Date of Birth: [birth year] Referring Provider (PT): Dr. Marian Sorrow   Encounter Date: 02/26/2021   PT End of Session - 02/26/21 1149     Visit Number 28    Number of Visits 40    Date for PT Re-Evaluation 04/10/21    Authorization Type Medicare A&B; Medicaid 2nd; VL review after 25 visits    Authorization Time Period 12/19/20-02/13/21; previous cert 0/23/4107-1/48/8948    Authorization - Visit Number 20    Authorization - Number of Visits 25    PT Start Time 1148    PT Stop Time 1230    PT Time Calculation (min) 42 min    Equipment Utilized During Treatment Gait belt    Activity Tolerance Patient tolerated treatment well    Behavior During Therapy WFL for tasks assessed/performed             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 ovaries had grown attached to her back bone   BRAIN SURGERY  2015   tumor resection-denies seizures   BREAST SURGERY Left 2014   biopsy. negative   HARDWARE REMOVAL Left 05/05/2018   Procedure: HARDWARE REMOVAL- LEFT ANKLE;  Surgeon: Kennedy Bucker, MD;  Location: [facility] ORS;  Service:  Orthopedics;  Laterality: Left;   I & D EXTREMITY Left 08/25/2015   Procedure: IRRIGATION AND DEBRIDEMENT THUMB;  Surgeon: Bradly Bienenstock, MD;  Location: [facility] OR;  Service: Orthopedics;  Laterality: Left;   ORIF ANKLE FRACTURE Left 02/20/2018   Procedure: OPEN REDUCTION INTERNAL FIXATION (ORIF) ANKLE FRACTURE;  Surgeon: Kennedy Bucker, MD;  Location: [facility] ORS;  Service: Orthopedics;  Laterality: Left;    Vitals:   02/26/21 1159  BP: (!) 121/54  Pulse: (!) 57     Subjective Assessment - 02/26/21 1154     Subjective Patient reports doing well. She has been a little fatigued. Her dog passed the support dog test and is now about to help assist her with mobility; Patient reports she was anxious during testing as there was a Loss adjuster, chartered during testing;    Pertinent History Patient diagnosed with MS in 2001. Past medical history significant for HTN, MS, DJD right knee, Hyperlipidemia, Thrombocytopenia, S/p ORIF Left ankle fracture.    How long can you sit comfortably? No restrictions    How long can you stand comfortably? No baseline ADL/IADL activity limited by standing tolerance    How long can you walk comfortably? today is limited by her walk to mailbox; maybe 15 min at evaluation  Currently in Pain? Yes    Pain Score 5     Pain Location Knee    Pain Orientation Right    Pain Descriptors / Indicators Aching;Sore    Pain Type Chronic pain    Pain Onset 1 to 4 weeks ago    Pain Frequency Intermittent    Aggravating Factors  worse with standing/walking    Pain Relieving Factors rest/pain meds    Effect of Pain on Daily Activities decreased activity tolerance;    Multiple Pain Sites No                  TREATMENT: Instructed patient in LE strengthening;  Standing with 3# ankle weight: Forward/backward step over orange hurdle x10 reps Side stepping over orange hurdle x10 reps; Required min VCs to increase step length for better foot clearance over obstacle. Patient instructed to  reduce rail assist to challenge stance control. Patient did have increased difficulty with forward/backward stepping. She also reports increased fatigue with 3# weights feeling like they are heavier than previous visits. Educated patient that resistance was progressed last visit. She feels as though maybe it feels heavier because the swelling is down in her legs;     Neuro Re-ed: CGA and cues for sequencing and safety awareness  Standing on airex: -forward/backward step airex to airex x10 reps unsupported with min A for safety -Feet apart on airex pad:  Side/side rock with finger tap to post it to challenge overhead reach and reaching outside base of support x10 reps each -tandem stance:  Unsupported x10 sec  Progressed to ball pass side/side x5 reps each foot in front;     Patient tolerated fair. She does report mild fatigue at end of session. She did have increased difficulty with reduced rail assist while on compliant surface. She also exhibits increased instability while stepping backwards on airex pad; Provided short seated rest breaks due to increased knee discomfort with weight bearing. she is scheduled for steroid injection in knee later this week;                             PT Education - 02/27/21 0834     Education Details Strengthening/balance;    Person(s) Educated Patient    Methods Explanation;Verbal cues    Comprehension Verbalized understanding;Returned demonstration;Verbal cues required;Need further instruction              PT Short Term Goals - 02/13/21 1657       PT SHORT TERM GOAL #1   Title Pt will be independent with HEP in order to improve strength and balance in order to decrease fall risk and improve function at home and work.    Baseline 09/27/2020- Patient has no formal HEP in place, 5/19: independent; 7/12: not performing, handout is lost 8/17: depends on the day due to medication changes and pain 9/6: intermittent compliance     Time 6    Period Weeks    Status Partially Met    Target Date 03/27/21               PT Long Term Goals - 02/13/21 1657       PT LONG TERM GOAL #1   Title Pt will improve FOTO score from 61  to target score of 64 to display perceived improvements in ability to complete ADL's and functional mobility activities.    Baseline 09/27/2020: FOTO=61, 10/26/20: 64%; 7/12: 58 8/17: 55% 9/6 63%  Time 8    Period Weeks    Status Partially Met    Target Date 04/10/21      PT LONG TERM GOAL #2   Title Pt will improve DGI by at least 3 points in order to demonstrate clinically significant improvement in balance and decreased risk for falls.    Baseline 09/27/2020-DGI=18/24, 5/19: 18/24 7/12: not tested; 7/15: 22/24    Time 8    Period Weeks    Status Achieved      PT LONG TERM GOAL #3   Title Pt will improve ABC by at least 10 % in order to demonstrate clinically significant improvement in balance confidence.    Baseline 09/27/2020-68.1%, 5/19: 78% 7/12: not assessed; 7/15: 61.25    Time 8    Period Weeks    Status Achieved      PT LONG TERM GOAL #4   Title Pt will decrease 5TSTS by at least 5 seconds (initial =23 sec with no UE support) in order to demonstrate clinically significant improvement in LE strength.    Baseline 09/27/2020= 23 sec. with No UE support, 5/19: 15 sec; 7/12: not assessed; 7/15: 17.68 sec 8/17: 13.8 seconds    Time 8    Period Weeks    Status Partially Met    Target Date 04/10/21      PT LONG TERM GOAL #5   Title Pt will decrease TUG to below 14 seconds/decrease in order to demonstrate decreased fall risk.    Baseline 09/27/2020- TUG score= 15.15 sec without UE support, 5/19: 8.89 sec; 12/19/20: not assessed; 7/15: 9.8 sec 9/6: 9.66 seconds    Time 8    Period Weeks    Status Achieved      Additional Long Term Goals   Additional Long Term Goals Yes      PT LONG TERM GOAL #6   Title Patient will increase six minute walk test distance to >1000 for progression  to community ambulator and improve gait ability    Baseline 4/26: 695 ft with one seated rest break, 5/19: 975 feet; 7/12: not safe to attempt, BP too high 8/17: 610 ft with multiple rest breaks    Time 8    Period Weeks    Status On-going    Target Date 04/10/21      PT LONG TERM GOAL #7   Title Patient will increase Functional Gait Assessment score to >22/30 as to reduce fall risk and improve dynamic gait safety with community ambulation.    Baseline 9/6: 16/30    Time 8    Period Weeks    Status New    Target Date 04/10/21      PT LONG TERM GOAL #8   Title Patient will tolerate 15 seconds of single leg stance without loss of balance to improve ability to get in and out of shower safely.    Baseline 9/6: L 9 seconds R 7 seconds    Time 8    Period Weeks    Status New    Target Date 04/10/21                   Plan - 02/27/21 0834     Clinical Impression Statement Patient motivated and participated well within session. She did have increased difficulty with dynamic balance tasks utilizing airex pad for compliant surface. Patient more unsteady with forward/backward stepping especially wiht reduced rail assist. Patient does require CGA to min A with unsupported standing. Patient reports increased fatigue/resistance with  3# ankle weights this session. She required cues for increased step length for obstacle negotiation. She continues to have increased right knee discomfort with weight bearing. Patient is scheduled for steroid injection for later in the week;  Patient would benefit from additional skilled PT Intervention to improve strength, balance and mobility;    Personal Factors and Comorbidities Comorbidity 3+    Comorbidities HTN, MS, Arthritis, COPD    Examination-Activity Limitations Bend;Carry;Continence;Lift;Squat;Stairs;Stand    Examination-Participation Restrictions Community Activity;Yard Work    Stability/Clinical Decision Making Stable/Uncomplicated    Rehab  Potential Poor    PT Frequency 2x / week    PT Duration 8 weeks    PT Treatment/Interventions ADLs/Self Care Home Management;Cryotherapy;Moist Heat;DME Instruction;Gait training;Stair training;Functional mobility training;Therapeutic activities;Therapeutic exercise;Balance training;Neuromuscular re-education;Patient/family education;Manual techniques;Passive range of motion    PT Next Visit Plan Limit AMB activity if BP conitnues to go to unsafe HTN (>282KSHN systolic); review HEP for home including balance prn    PT Home Exercise Plan pt unaware of location of her original handout, has not been compliant, warrants review (12/19/20)    Consulted and Agree with Plan of Care Patient             Patient will benefit from skilled therapeutic intervention in order to improve the following deficits and impairments:  Abnormal gait, Cardiopulmonary status limiting activity, Decreased activity tolerance, Decreased balance, Decreased coordination, Decreased endurance, Decreased mobility, Decreased range of motion, Decreased strength, Difficulty walking, Impaired flexibility, Obesity, Pain  Visit Diagnosis: Other abnormalities of gait and mobility  Muscle weakness (generalized)  Unsteadiness on feet  Difficulty in walking, not elsewhere classified     Problem List Patient Active Problem List   Diagnosis Date Noted   Atherosclerosis of abdominal aorta ([place]) 01/10/2021   Coronary artery disease involving native coronary artery of native heart 01/10/2021   OSA (obstructive sleep apnea) 04/19/2020   Statin intolerance 01/14/2020   S/P ORIF (open reduction internal fixation) fracture 02/20/2018   DJD (degenerative joint disease) of knee 01/02/2017   Advanced care planning/counseling discussion 01/02/2017   Thrombocytopenia ([place]) 10/30/2015   Multiple sclerosis ([place])    Essential hypertension    Hyperlipidemia    Benign neoplasm of brain ([place]) 04/30/2013   Cerebral meningioma ([place])  04/29/2013   Malignant hypertension 04/29/2013   Obesity 04/15/2013    Santiaga Butzin, PT, DPT 02/27/2021, 8:43 AM  [facility] [address] [place], [place], [address] Phone: [phone]   Fax:  [phone]  Name: ZEANNA SUNDE MRN: [id number] Date of Birth: 12/05/1950

## 2021-02-28 ENCOUNTER — Ambulatory Visit: Payer: Medicare Other | Admitting: Physical Therapy

## 2021-02-28 ENCOUNTER — Other Ambulatory Visit: Payer: Self-pay

## 2021-02-28 ENCOUNTER — Encounter: Payer: Self-pay | Admitting: Physical Therapy

## 2021-02-28 VITALS — BP 155/61

## 2021-02-28 DIAGNOSIS — R2689 Other abnormalities of gait and mobility: Secondary | ICD-10-CM

## 2021-02-28 DIAGNOSIS — M6281 Muscle weakness (generalized): Secondary | ICD-10-CM

## 2021-02-28 DIAGNOSIS — R262 Difficulty in walking, not elsewhere classified: Secondary | ICD-10-CM

## 2021-02-28 DIAGNOSIS — R2681 Unsteadiness on feet: Secondary | ICD-10-CM

## 2021-02-28 DIAGNOSIS — R278 Other lack of coordination: Secondary | ICD-10-CM

## 2021-02-28 NOTE — Therapy (Signed)
[facility] MAIN [facility] SERVICES [address] [place], [place], [address] Phone: [phone]   Fax:  [phone]  Physical Therapy Treatment  Patient Details  Name: Leslie Smith MRN: [id number] Date of Birth: [birth year] Referring Provider (PT): Dr. Darlin Priestly   Encounter Date: 02/28/2021   PT End of Session - 02/28/21 1417     Visit Number 29    Number of Visits 40    Date for PT Re-Evaluation 04/10/21    Authorization Type Medicare A&B; Medicaid 2nd; VL review after 25 visits    Authorization Time Period 4/78/41-07/18/18; previous cert 01/20/8870-9/59/7471    Authorization - Visit Number 59    Authorization - Number of Visits 25    PT Start Time 1140    PT Stop Time 1228    PT Time Calculation (min) 48 min    Equipment Utilized During Treatment Gait belt    Activity Tolerance Patient tolerated treatment well    Behavior During Therapy WFL for tasks assessed/performed             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[place]) 09/15/2014   Bladder incontinence    Cancer ([place]) 2014   brain tumor, resected   Cellulitis and abscess of leg 2019   2 episodes this year   Complication of anesthesia    DJD (degenerative joint disease) of knee 2018   Fall    Fatigue    History of blood transfusion    Hypertension    MS (multiple sclerosis) ([place])    Myelitis ([place])    Neuromuscular disorder ([place]) 2001   multiple sclerosis diagnosed by MRI   PONV (postoperative nausea and vomiting)    severe   Thrombocytopenia ([place])     Past Surgical History:  Procedure Laterality Date   ABDOMINAL HYSTERECTOMY  2011   required wound vac for 6 weeks. ovaries had grown attached to her back bone   BRAIN SURGERY  2015   tumor resection-denies seizures   BREAST SURGERY Left 2014   biopsy. negative   HARDWARE REMOVAL Left 05/05/2018   Procedure: HARDWARE REMOVAL- LEFT ANKLE;  Surgeon: Hessie Knows, MD;  Location: [facility] ORS;  Service:  Orthopedics;  Laterality: Left;   I & D EXTREMITY Left 08/25/2015   Procedure: IRRIGATION AND DEBRIDEMENT THUMB;  Surgeon: Iran Planas, MD;  Location: [facility];  Service: Orthopedics;  Laterality: Left;   ORIF ANKLE FRACTURE Left 02/20/2018   Procedure: OPEN REDUCTION INTERNAL FIXATION (ORIF) ANKLE FRACTURE;  Surgeon: Hessie Knows, MD;  Location: [facility] ORS;  Service: Orthopedics;  Laterality: Left;    Vitals:   02/28/21 1141  BP: (!) 155/61     Subjective Assessment - 02/28/21 1141     Subjective Patient reports doing well. She reports enjoying the concert last night and being able to walk in the parking lot well.    Pertinent History Patient diagnosed with MS in 2001. Past medical history significant for HTN, MS, DJD right knee, Hyperlipidemia, Thrombocytopenia, S/p ORIF Left ankle fracture.    How long can you sit comfortably? No restrictions    How long can you stand comfortably? No baseline ADL/IADL activity limited by standing tolerance    How long can you walk comfortably? today is limited by her walk to mailbox; maybe 15 min at evaluation    Currently in Pain? Yes    Pain Score 5     Pain Location Knee    Pain Orientation Right  Pain Descriptors / Indicators Aching;Sore    Pain Type Chronic pain    Pain Onset 1 to 4 weeks ago    Pain Frequency Intermittent    Aggravating Factors  worse with standing/walking    Pain Relieving Factors rest/pain meds    Effect of Pain on Daily Activities decreased activity tolerance;    Multiple Pain Sites No                 TREATMENT: Gait on treadmill 1.0-1.3 with 2-1 rail assist x5 min (0.1 miles total distance) patient does exhibit increased unsteadiness with reduced rail assist with uneven cadence/step length; No foot drag observed;     Neuro Re-ed: CGA and cues for sequencing and safety awareness   Standing on airex: -forward/backward step airex to airex x10 reps unsupported with min A for safety -forward step toe tap airex to  airex while holding small ball x5 reps  Progressed to toe tap with BUE ball side/side x5 reps each foot -Feet apart on airex pad:             Side/side rock with ball tap to post it to challenge overhead reach and reaching outside base of support x10 reps each   Forward/backward step ups on airex x2, x10 reps with 1-0 rail assist; Instructed patient to lead with LLE and descend with RLE first to reduce discomfort on right knee; Patient did require min A for safety;   Airex beam: Forward tandem gait x4 laps, progressed to forward/backward tandem gait x2 laps; Feet apart facing side, [place] pass side/side X8 each direction with CGA for safety;    Patient tolerated fair. She does report mild fatigue at end of session. She did have increased difficulty with reduced rail assist while on compliant surface. Progressed balance exercise utilizing padded services and reducing rail assist. Patient does exhibit decreased ankle strategies with narrow base of support and with compliant surface; Provided short seated rest breaks due to increased knee discomfort with weight bearing. she is scheduled for steroid injection in knee later this week;                                PT Education - 02/28/21 1416     Education Details strengthening/balance;    Person(s) Educated Patient    Methods Explanation;Verbal cues    Comprehension Verbalized understanding;Returned demonstration;Verbal cues required;Need further instruction              PT Short Term Goals - 02/13/21 1657       PT SHORT TERM GOAL #1   Title Pt will be independent with HEP in order to improve strength and balance in order to decrease fall risk and improve function at home and work.    Baseline 09/27/2020- Patient has no formal HEP in place, 5/19: independent; 7/12: not performing, handout is lost 8/17: depends on the day due to medication changes and pain 9/6: intermittent compliance    Time 6    Period Weeks     Status Partially Met    Target Date 03/27/21               PT Long Term Goals - 02/13/21 1657       PT LONG TERM GOAL #1   Title Pt will improve FOTO score from 61  to target score of 64 to display perceived improvements in ability to complete ADL's and functional mobility activities.  Baseline 09/27/2020: FOTO=61, 10/26/20: 64%; 7/12: 58 8/17: 55% 9/6 63%    Time 8    Period Weeks    Status Partially Met    Target Date 04/10/21      PT LONG TERM GOAL #2   Title Pt will improve DGI by at least 3 points in order to demonstrate clinically significant improvement in balance and decreased risk for falls.    Baseline 09/27/2020-DGI=18/24, 5/19: 18/24 7/12: not tested; 7/15: 22/24    Time 8    Period Weeks    Status Achieved      PT LONG TERM GOAL #3   Title Pt will improve ABC by at least 10 % in order to demonstrate clinically significant improvement in balance confidence.    Baseline 09/27/2020-68.1%, 5/19: 78% 7/12: not assessed; 7/15: 61.25    Time 8    Period Weeks    Status Achieved      PT LONG TERM GOAL #4   Title Pt will decrease 5TSTS by at least 5 seconds (initial =23 sec with no UE support) in order to demonstrate clinically significant improvement in LE strength.    Baseline 09/27/2020= 23 sec. with No UE support, 5/19: 15 sec; 7/12: not assessed; 7/15: 17.68 sec 8/17: 13.8 seconds    Time 8    Period Weeks    Status Partially Met    Target Date 04/10/21      PT LONG TERM GOAL #5   Title Pt will decrease TUG to below 14 seconds/decrease in order to demonstrate decreased fall risk.    Baseline 09/27/2020- TUG score= 15.15 sec without UE support, 5/19: 8.89 sec; 12/19/20: not assessed; 7/15: 9.8 sec 9/6: 9.66 seconds    Time 8    Period Weeks    Status Achieved      Additional Long Term Goals   Additional Long Term Goals Yes      PT LONG TERM GOAL #6   Title Patient will increase six minute walk test distance to >1000 for progression to community ambulator and  improve gait ability    Baseline 4/26: 695 ft with one seated rest break, 5/19: 975 feet; 7/12: not safe to attempt, BP too high 8/17: 610 ft with multiple rest breaks    Time 8    Period Weeks    Status On-going    Target Date 04/10/21      PT LONG TERM GOAL #7   Title Patient will increase Functional Gait Assessment score to >22/30 as to reduce fall risk and improve dynamic gait safety with community ambulation.    Baseline 9/6: 16/30    Time 8    Period Weeks    Status New    Target Date 04/10/21      PT LONG TERM GOAL #8   Title Patient will tolerate 15 seconds of single leg stance without loss of balance to improve ability to get in and out of shower safely.    Baseline 9/6: L 9 seconds R 7 seconds    Time 8    Period Weeks    Status New    Target Date 04/10/21                   Plan - 02/28/21 1417     Clinical Impression Statement Patient motivated and participated well within session. She continues to be limited with with stance control with increased right knee discomfort. She is scheduled for steroid injection in right knee on Friday. Patient does  have difficulty with stance control on compliant surface. Patient instructed in gait on treadmill. she does exhibit uneven cadence/step length with reduced rail assist due to limited UE support and imbalance. She does exhibit less shortness of breath which is improvement. She would benefit from additional skilled PT Intervention to improve strength, balance and mobility;    Personal Factors and Comorbidities Comorbidity 3+    Comorbidities HTN, MS, Arthritis, COPD    Examination-Activity Limitations Bend;Carry;Continence;Lift;Squat;Stairs;Stand    Examination-Participation Restrictions Community Activity;Yard Work    Stability/Clinical Decision Making Stable/Uncomplicated    Rehab Potential Poor    PT Frequency 2x / week    PT Duration 8 weeks    PT Treatment/Interventions ADLs/Self Care Home  Management;Cryotherapy;Moist Heat;DME Instruction;Gait training;Stair training;Functional mobility training;Therapeutic activities;Therapeutic exercise;Balance training;Neuromuscular re-education;Patient/family education;Manual techniques;Passive range of motion    PT Next Visit Plan Limit AMB activity if BP conitnues to go to unsafe HTN (>[id number] systolic); review HEP for home including balance prn    PT Home Exercise Plan pt unaware of location of her original handout, has not been compliant, warrants review (12/19/20)    Consulted and Agree with Plan of Care Patient             Patient will benefit from skilled therapeutic intervention in order to improve the following deficits and impairments:  Abnormal gait, Cardiopulmonary status limiting activity, Decreased activity tolerance, Decreased balance, Decreased coordination, Decreased endurance, Decreased mobility, Decreased range of motion, Decreased strength, Difficulty walking, Impaired flexibility, Obesity, Pain  Visit Diagnosis: Other abnormalities of gait and mobility  Muscle weakness (generalized)  Unsteadiness on feet  Difficulty in walking, not elsewhere classified  Other lack of coordination     Problem List Patient Active Problem List   Diagnosis Date Noted   Atherosclerosis of abdominal aorta ([place]) 01/10/2021   Coronary artery disease involving native coronary artery of native heart 01/10/2021   OSA (obstructive sleep apnea) 04/19/2020   Statin intolerance 01/14/2020   S/P ORIF (open reduction internal fixation) fracture 02/20/2018   DJD (degenerative joint disease) of knee 01/02/2017   Advanced care planning/counseling discussion 01/02/2017   Thrombocytopenia ([place]) 10/30/2015   Multiple sclerosis ([place])    Essential hypertension    Hyperlipidemia    Benign neoplasm of brain ([place]) 04/30/2013   Cerebral meningioma ([place]) 04/29/2013   Malignant hypertension 04/29/2013   Obesity 04/15/2013    Jessee Newnam,  PT, DPT 02/28/2021, 2:27 PM  [facility] [address] [place], [place], [address] Phone: [phone]   Fax:  [phone]  Name: Leslie Smith MRN: [id number] Date of Birth: Jun 19, 1950

## 2021-03-06 ENCOUNTER — Encounter: Payer: Self-pay | Admitting: Physical Therapy

## 2021-03-06 ENCOUNTER — Other Ambulatory Visit: Payer: Self-pay

## 2021-03-06 ENCOUNTER — Ambulatory Visit: Payer: Medicare Other | Admitting: Physical Therapy

## 2021-03-06 DIAGNOSIS — R2689 Other abnormalities of gait and mobility: Secondary | ICD-10-CM

## 2021-03-06 DIAGNOSIS — R262 Difficulty in walking, not elsewhere classified: Secondary | ICD-10-CM

## 2021-03-06 DIAGNOSIS — M6281 Muscle weakness (generalized): Secondary | ICD-10-CM

## 2021-03-06 DIAGNOSIS — R2681 Unsteadiness on feet: Secondary | ICD-10-CM

## 2021-03-06 NOTE — Therapy (Signed)
[place] MAIN [facility] SERVICES [place], [place], [address] Phone: [phone]   Fax:  [phone]  Physical Therapy Treatment Physical Therapy Progress Note   Dates of reporting period  01/23/21   to   03/06/21   Patient Details  Name: Leslie Smith MRN: [id number] Date of Birth: February 06, 1951 Referring Provider (PT): Dr. Darlin Priestly   Encounter Date: 03/06/2021   PT End of Session - 03/06/21 1005     Visit Number 30    Number of Visits 40    Date for PT Re-Evaluation 04/10/21    Authorization Type Medicare A&B; Medicaid 2nd; VL review after 25 visits    Authorization Time Period 07/13/53-02/15/40; previous cert 6/38/4536-4/68/0321    Authorization - Visit Number 34    Authorization - Number of Visits 25    PT Start Time 0848    PT Stop Time 0932    PT Time Calculation (min) 44 min    Equipment Utilized During Treatment Gait belt    Activity Tolerance Patient tolerated treatment well;Patient limited by fatigue    Behavior During Therapy [facility] for tasks assessed/performed             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   Bladder incontinence    Cancer ([place]) 2014   brain tumor, resected   Cellulitis and abscess of leg 2019   2 episodes this year   Complication of anesthesia    DJD (degenerative joint disease) of knee 2018   Fall    Fatigue    History of blood transfusion    Hypertension    MS (multiple sclerosis) ([place])    Myelitis ([place])    Neuromuscular disorder ([place]) 2001   multiple sclerosis diagnosed by MRI   PONV (postoperative nausea and vomiting)    severe   Thrombocytopenia ([place])     Past Surgical History:  Procedure Laterality Date   ABDOMINAL HYSTERECTOMY  2011   required wound vac for 6 weeks. ovaries had grown attached to her back bone   BRAIN SURGERY  2015   tumor resection-denies seizures   BREAST SURGERY Left 2014   biopsy. negative   HARDWARE REMOVAL  Left 05/05/2018   Procedure: HARDWARE REMOVAL- LEFT ANKLE;  Surgeon: Hessie Knows, MD;  Location: [facility];  Service: Orthopedics;  Laterality: Left;   I & D EXTREMITY Left 08/25/2015   Procedure: IRRIGATION AND DEBRIDEMENT THUMB;  Surgeon: Iran Planas, MD;  Location: [place];  Service: Orthopedics;  Laterality: Left;   ORIF ANKLE FRACTURE Left 02/20/2018   Procedure: OPEN REDUCTION INTERNAL FIXATION (ORIF) ANKLE FRACTURE;  Surgeon: Hessie Knows, MD;  Location: [facility];  Service: Orthopedics;  Laterality: Left;    There were no vitals filed for this visit.   Subjective Assessment - 03/06/21 0854     Subjective Patient reports Friday/Saturday she just couldn't move. She couldnt' get off the couch. She was having increased knee pain and weakness. She reports feeling a little better today. She states by 12 noon her legs just get worn out. She is not sure how much of it is related to weather change and MS flares. She wasn't able to get her steriod injection, that is scheduled for end of this week;    Pertinent History Patient diagnosed with MS in 2001. Past medical history significant for HTN, MS, DJD right knee, Hyperlipidemia, Thrombocytopenia, S/p ORIF Left ankle fracture.    How long can you sit  comfortably? No restrictions    How long can you stand comfortably? No baseline ADL/IADL activity limited by standing tolerance    How long can you walk comfortably? today is limited by her walk to mailbox; maybe 15 min at evaluation    Currently in Pain? Yes    Pain Location Knee    Pain Orientation Right    Pain Descriptors / Indicators Aching;Sore    Pain Type Chronic pain    Pain Onset 1 to 4 weeks ago    Pain Frequency Intermittent    Aggravating Factors  worse with standing/walking    Pain Relieving Factors rest/pain meds    Effect of Pain on Daily Activities decreased activity tolerance;    Multiple Pain Sites No                     TREATMENT: PT instructed patient in outcome  measures:  6 min walk 990 feet, BP 215/48, HR 135, SPo2 95% After 3-5 min rest break, BP: 159/53, HR 79 Improved from last reassessment 8/17: 610 feet; Limited community ambulator and less than age group norms; Concerned about elevated BP with short distance walking;   FOTO- 56%, slight decline likely from increased knee pain and decreased mobility with recent MS flare ups  SLS: R: 4-5 sec, L: 18 sec  Instructed patient in dynamic balance: Forward/backward walking firm to red mat to firm to challenge dynamic balance with transition from surface Side stepping on red mat x1 laps each direction Diagonal steps forward/backward firm, red mat to firm to challenge dynamic balance with transition on different surface x2 laps each Patient able to complete with CGA to close supervision. She does exhibit mild unsteadiness with transition on different surfaces. She also has increased difficulty with backward walking.  BP at end of session: 124/67  Patient's condition has the potential to improve in response to therapy. Maximum improvement is yet to be obtained. The anticipated improvement is attainable and reasonable in a generally predictable time.  Patient reports adherence with HEP but has been limited with increased right knee pain and fatigue;                           PT Education - 03/06/21 1005     Education Details progress towards goals/balance;    Person(s) Educated Patient    Methods Explanation;Verbal cues    Comprehension Verbalized understanding;Returned demonstration;Verbal cues required;Need further instruction              PT Short Term Goals - 03/06/21 0900       PT SHORT TERM GOAL #1   Title Pt will be independent with HEP in order to improve strength and balance in order to decrease fall risk and improve function at home and work.    Baseline 09/27/2020- Patient has no formal HEP in place, 5/19: independent; 7/12: not performing, handout is lost  8/17: depends on the day due to medication changes and pain 9/6: intermittent compliance, 9/27: intermittent compliant due MS flare up and knee pain;    Time 6    Period Weeks    Status Partially Met    Target Date 03/27/21               PT Long Term Goals - 03/06/21 0859       PT LONG TERM GOAL #1   Title Pt will improve FOTO score from 61  to target score of 64 to  display perceived improvements in ability to complete ADL's and functional mobility activities.    Baseline 09/27/2020: FOTO=61, 10/26/20: 64%; 7/12: 58 8/17: 55% 9/6 63%, 9/27: 56%    Time 8    Period Weeks    Status Partially Met    Target Date 04/10/21      PT LONG TERM GOAL #2   Title Pt will improve DGI by at least 3 points in order to demonstrate clinically significant improvement in balance and decreased risk for falls.    Baseline 09/27/2020-DGI=18/24, 5/19: 18/24 7/12: not tested; 7/15: 22/24    Time 8    Period Weeks    Status Achieved    Target Date 04/10/21      PT LONG TERM GOAL #3   Title Pt will improve ABC by at least 10 % in order to demonstrate clinically significant improvement in balance confidence.    Baseline 09/27/2020-68.1%, 5/19: 78% 7/12: not assessed; 7/15: 61.25    Time 8    Period Weeks    Status Achieved    Target Date 04/10/21      PT LONG TERM GOAL #4   Title Pt will decrease 5TSTS by at least 5 seconds (initial =23 sec with no UE support) in order to demonstrate clinically significant improvement in LE strength.    Baseline 09/27/2020= 23 sec. with No UE support, 5/19: 15 sec; 7/12: not assessed; 7/15: 17.68 sec 8/17: 13.8 seconds, 9/27: deferred due to knee pain;    Time 8    Period Weeks    Status Partially Met    Target Date 04/10/21      PT LONG TERM GOAL #5   Title Pt will decrease TUG to below 14 seconds/decrease in order to demonstrate decreased fall risk.    Baseline 09/27/2020- TUG score= 15.15 sec without UE support, 5/19: 8.89 sec; 12/19/20: not assessed; 7/15: 9.8 sec  9/6: 9.66 seconds    Time 8    Period Weeks    Status Achieved    Target Date 04/10/21      PT LONG TERM GOAL #6   Title Patient will increase six minute walk test distance to >1000 for progression to community ambulator and improve gait ability    Baseline 4/26: 695 ft with one seated rest break, 5/19: 975 feet; 7/12: not safe to attempt, BP too high 8/17: 610 ft with multiple rest breaks, 9/27: 990 feet, high BP    Time 8    Period Weeks    Status On-going    Target Date 04/10/21      PT LONG TERM GOAL #7   Title Patient will increase Functional Gait Assessment score to >22/30 as to reduce fall risk and improve dynamic gait safety with community ambulation.    Baseline 9/6: 16/30, 9/27: Deferred due to time;    Time 8    Period Weeks    Status On-going    Target Date 04/10/21      PT LONG TERM GOAL #8   Title Patient will tolerate 15 seconds of single leg stance without loss of balance to improve ability to get in and out of shower safely.    Baseline 9/6: L 9 seconds R 7 seconds, 9/27: R: 4-5 sec, limited by knee pain, L: 18 sec    Time 8    Period Weeks    Status Partially Met    Target Date 04/10/21  Plan - 03/06/21 1006     Clinical Impression Statement Patient motivated and participated well within session. she was instructed in outcome measures to address progress towards goals. Patient exhibits a significant improvement in gait this session being able to walk close to community distance of 990 feet. However despite improvement in gait distance, patient reports increased shortness of breath and exhibits a significant jump in BP. Concerned low endurance and poor conditioning contribute to vital sign response. Patient reports continued RLE knee pain. She is scheduled to get a steroid injection later this week. She does exhibits decreased mobility as a result of knee discomfort. Patient instructed in dynamic balance activities using red mat to  challenge stance control on uneven surface. Patient has difficulty transitioning from firm surface to compliant surface. She required close supervision and cues for increased step length. Patient would benefit from additional skilled PT Intervention to improve strength, balance and mobility.    Personal Factors and Comorbidities Comorbidity 3+    Comorbidities HTN, MS, Arthritis, COPD    Examination-Activity Limitations Bend;Carry;Continence;Lift;Squat;Stairs;Stand    Examination-Participation Restrictions Community Activity;Yard Work    Stability/Clinical Decision Making Stable/Uncomplicated    Rehab Potential Poor    PT Frequency 2x / week    PT Duration 8 weeks    PT Treatment/Interventions ADLs/Self Care Home Management;Cryotherapy;Moist Heat;DME Instruction;Gait training;Stair training;Functional mobility training;Therapeutic activities;Therapeutic exercise;Balance training;Neuromuscular re-education;Patient/family education;Manual techniques;Passive range of motion    PT Next Visit Plan Limit AMB activity if BP conitnues to go to unsafe HTN (>250NLZJ systolic); review HEP for home including balance prn    PT Home Exercise Plan pt unaware of location of her original handout, has not been compliant, warrants review (12/19/20)    Consulted and Agree with Plan of Care Patient             Patient will benefit from skilled therapeutic intervention in order to improve the following deficits and impairments:  Abnormal gait, Cardiopulmonary status limiting activity, Decreased activity tolerance, Decreased balance, Decreased coordination, Decreased endurance, Decreased mobility, Decreased range of motion, Decreased strength, Difficulty walking, Impaired flexibility, Obesity, Pain  Visit Diagnosis: Other abnormalities of gait and mobility  Muscle weakness (generalized)  Unsteadiness on feet  Difficulty in walking, not elsewhere classified     Problem List Patient Active Problem List    Diagnosis Date Noted   Atherosclerosis of abdominal aorta ([place]) 01/10/2021   Coronary artery disease involving native coronary artery of native heart 01/10/2021   OSA (obstructive sleep apnea) 04/19/2020   Statin intolerance 01/14/2020   S/P ORIF (open reduction internal fixation) fracture 02/20/2018   DJD (degenerative joint disease) of knee 01/02/2017   Advanced care planning/counseling discussion 01/02/2017   Thrombocytopenia ([place]) 10/30/2015   Multiple sclerosis ([place])    Essential hypertension    Hyperlipidemia    Benign neoplasm of brain ([place]) 04/30/2013   Cerebral meningioma ([place]) 04/29/2013   Malignant hypertension 04/29/2013   Obesity 04/15/2013    Makaylie Dedeaux, PT, DPT 03/06/2021, 10:16 AM  [place], [place], [address] Phone: [phone]   Fax:  [phone]  Name: Leslie Smith MRN: [id number] Date of Birth: May 14, 1951

## 2021-03-14 ENCOUNTER — Ambulatory Visit: Payer: Medicare Other | Admitting: Physical Therapy

## 2021-03-19 ENCOUNTER — Ambulatory Visit: Payer: Medicare Other | Attending: Neurology | Admitting: Physical Therapy

## 2021-03-19 ENCOUNTER — Other Ambulatory Visit: Payer: Self-pay

## 2021-03-19 ENCOUNTER — Encounter: Payer: Self-pay | Admitting: Physical Therapy

## 2021-03-19 DIAGNOSIS — R262 Difficulty in walking, not elsewhere classified: Secondary | ICD-10-CM

## 2021-03-19 DIAGNOSIS — R2681 Unsteadiness on feet: Secondary | ICD-10-CM | POA: Diagnosis present

## 2021-03-19 DIAGNOSIS — R278 Other lack of coordination: Secondary | ICD-10-CM

## 2021-03-19 DIAGNOSIS — R269 Unspecified abnormalities of gait and mobility: Secondary | ICD-10-CM | POA: Diagnosis present

## 2021-03-19 DIAGNOSIS — M6281 Muscle weakness (generalized): Secondary | ICD-10-CM | POA: Diagnosis present

## 2021-03-19 DIAGNOSIS — R2689 Other abnormalities of gait and mobility: Secondary | ICD-10-CM | POA: Diagnosis not present

## 2021-03-19 NOTE — Therapy (Signed)
[place] [facility] SERVICES [address] [place], [place], [address] Phone: [phone]   Fax:  [phone]  Physical Therapy Treatment  Patient Details  Name: Leslie Smith MRN: [id number] Date of Birth: [birth year] Referring Provider (PT): Dr. Darlin Priestly   Encounter Date: 03/19/2021   PT End of Session - 03/19/21 1216     Visit Number 31    Number of Visits 40    Date for PT Re-Evaluation 04/10/21    Authorization Type Medicare A&B; Medicaid 2nd; VL review after 25 visits    Authorization Time Period 0/25/85-07/17/76; previous cert 2/42/3536-1/44/3154    Authorization - Visit Number 4    Authorization - Number of Visits 25    PT Start Time 0086    PT Stop Time 1230    PT Time Calculation (min) 41 min    Equipment Utilized During Treatment Gait belt    Activity Tolerance Patient tolerated treatment well;Patient limited by fatigue    Behavior During Therapy WFL for tasks assessed/performed             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   Bladder incontinence    Cancer ([place]) 2014   brain tumor, resected   Cellulitis and abscess of leg 2019   2 episodes this year   Complication of anesthesia    DJD (degenerative joint disease) of knee 2018   Fall    Fatigue    History of blood transfusion    Hypertension    MS (multiple sclerosis) ([place])    Myelitis ([place])    Neuromuscular disorder ([place]) 2001   multiple sclerosis diagnosed by MRI   PONV (postoperative nausea and vomiting)    severe   Thrombocytopenia ([place])     Past Surgical History:  Procedure Laterality Date   ABDOMINAL HYSTERECTOMY  2011   required wound vac for 6 weeks. ovaries had grown attached to her back bone   BRAIN SURGERY  2015   tumor resection-denies seizures   BREAST SURGERY Left 2014   biopsy. negative   HARDWARE REMOVAL Left 05/05/2018   Procedure: HARDWARE REMOVAL- LEFT ANKLE;  Surgeon: Hessie Knows, MD;   Location: [facility];  Service: Orthopedics;  Laterality: Left;   I & D EXTREMITY Left 08/25/2015   Procedure: IRRIGATION AND DEBRIDEMENT THUMB;  Surgeon: Iran Planas, MD;  Location: [facility];  Service: Orthopedics;  Laterality: Left;   ORIF ANKLE FRACTURE Left 02/20/2018   Procedure: OPEN REDUCTION INTERNAL FIXATION (ORIF) ANKLE FRACTURE;  Surgeon: Hessie Knows, MD;  Location: [facility];  Service: Orthopedics;  Laterality: Left;    There were no vitals filed for this visit.   Subjective Assessment - 03/19/21 1155     Subjective Patient reports getting steriod injection in knee last week; She reports being sick this weekend with stomch bug; She reports not sleeping well last night;    Pertinent History Patient diagnosed with MS in 2001. Past medical history significant for HTN, MS, DJD right knee, Hyperlipidemia, Thrombocytopenia, S/p ORIF Left ankle fracture.    How long can you sit comfortably? No restrictions    How long can you stand comfortably? No baseline ADL/IADL activity limited by standing tolerance    How long can you walk comfortably? today is limited by her walk to mailbox; maybe 15 min at evaluation    Currently in Pain? Yes    Pain Score 4     Pain Location Knee  Pain Orientation Right    Pain Descriptors / Indicators Aching;Sore    Pain Type Chronic pain    Pain Onset 1 to 4 weeks ago    Pain Frequency Intermittent    Aggravating Factors  worse with standing/walking    Pain Relieving Factors rest/pain meds    Effect of Pain on Daily Activities decreased activity tolerance;    Multiple Pain Sites No                   TREATMENT: Instructed patient in LE strengthening;  Standing with 3# ankle weight: -Hip flexion march x12 reps each LE; -hip abduction x12 reps each LE;  -hip extension x12 reps each LE: Required min VCS for erect posture and to increase AROM for better strengthening;   Seated LAQ 3# x15 rep each LE with cues to increase ankle DF for better  hamstring stretch;  Forward/backward step over 1/2 bolster x10 reps Required min VCs to increase step length for better foot clearance over obstacle. Patient instructed to reduce rail assist to challenge stance control. Patient was able to take a better step with less instability likely due to improvement in knee discomfort;   Vitals: BP: 145/56, HR 63     Neuro Re-ed: CGA and cues for sequencing and safety awareness   Standing on airex: -forward/backward step firm to airex x10 reps unsupported with CGA for safety -Feet apart on airex pad:             Heel raises x15 reps unsupported, min VCs to keep knees extended for better weight shift;   Alternate toe taps to 8 inch step unsupported x10 reps each LE, CGA for safety  One foot on airex, one foot on 8 inch step: unsupported: BUE cone pass side/side x5 reps each LE;   Mini squat unsupported:   Head turns side/side x5 reps   BUE overhead reach with head up/down x5 reps   Patient tolerated well. Overall she reports less knee discomfort. She was also able to exhibit better balance and less unsteadiness negotiating obstacles, likely due to less knee pain following injection.  Patient does exhibit mild unsteadiness especially with RLE stance on compliant surface with decreased ankle strategies, requiring CGA for safety;                               PT Education - 03/19/21 1216     Education Details strengthening/balance;    Person(s) Educated Patient    Methods Explanation;Verbal cues    Comprehension Verbalized understanding;Returned demonstration;Verbal cues required;Need further instruction              PT Short Term Goals - 03/06/21 0900       PT SHORT TERM GOAL #1   Title Pt will be independent with HEP in order to improve strength and balance in order to decrease fall risk and improve function at home and work.    Baseline 09/27/2020- Patient has no formal HEP in place, 5/19: independent; 7/12: not  performing, handout is lost 8/17: depends on the day due to medication changes and pain 9/6: intermittent compliance, 9/27: intermittent compliant due MS flare up and knee pain;    Time 6    Period Weeks    Status Partially Met    Target Date 03/27/21               PT Long Term Goals - 03/06/21 9767  PT LONG TERM GOAL #1   Title Pt will improve FOTO score from 61  to target score of 64 to display perceived improvements in ability to complete ADL's and functional mobility activities.    Baseline 09/27/2020: FOTO=61, 10/26/20: 64%; 7/12: 58 8/17: 55% 9/6 63%, 9/27: 56%    Time 8    Period Weeks    Status Partially Met    Target Date 04/10/21      PT LONG TERM GOAL #2   Title Pt will improve DGI by at least 3 points in order to demonstrate clinically significant improvement in balance and decreased risk for falls.    Baseline 09/27/2020-DGI=18/24, 5/19: 18/24 7/12: not tested; 7/15: 22/24    Time 8    Period Weeks    Status Achieved    Target Date 04/10/21      PT LONG TERM GOAL #3   Title Pt will improve ABC by at least 10 % in order to demonstrate clinically significant improvement in balance confidence.    Baseline 09/27/2020-68.1%, 5/19: 78% 7/12: not assessed; 7/15: 61.25    Time 8    Period Weeks    Status Achieved    Target Date 04/10/21      PT LONG TERM GOAL #4   Title Pt will decrease 5TSTS by at least 5 seconds (initial =23 sec with no UE support) in order to demonstrate clinically significant improvement in LE strength.    Baseline 09/27/2020= 23 sec. with No UE support, 5/19: 15 sec; 7/12: not assessed; 7/15: 17.68 sec 8/17: 13.8 seconds, 9/27: deferred due to knee pain;    Time 8    Period Weeks    Status Partially Met    Target Date 04/10/21      PT LONG TERM GOAL #5   Title Pt will decrease TUG to below 14 seconds/decrease in order to demonstrate decreased fall risk.    Baseline 09/27/2020- TUG score= 15.15 sec without UE support, 5/19: 8.89 sec; 12/19/20:  not assessed; 7/15: 9.8 sec 9/6: 9.66 seconds    Time 8    Period Weeks    Status Achieved    Target Date 04/10/21      PT LONG TERM GOAL #6   Title Patient will increase six minute walk test distance to >1000 for progression to community ambulator and improve gait ability    Baseline 4/26: 695 ft with one seated rest break, 5/19: 975 feet; 7/12: not safe to attempt, BP too high 8/17: 610 ft with multiple rest breaks, 9/27: 990 feet, high BP    Time 8    Period Weeks    Status On-going    Target Date 04/10/21      PT LONG TERM GOAL #7   Title Patient will increase Functional Gait Assessment score to >22/30 as to reduce fall risk and improve dynamic gait safety with community ambulation.    Baseline 9/6: 16/30, 9/27: Deferred due to time;    Time 8    Period Weeks    Status On-going    Target Date 04/10/21      PT LONG TERM GOAL #8   Title Patient will tolerate 15 seconds of single leg stance without loss of balance to improve ability to get in and out of shower safely.    Baseline 9/6: L 9 seconds R 7 seconds, 9/27: R: 4-5 sec, limited by knee pain, L: 18 sec    Time 8    Period Weeks    Status Partially Met  Target Date 04/10/21                   Plan - 03/19/21 1216     Clinical Impression Statement Patient motivated and participated well within session. She was instructed in LE strengthening exercise requiring min VCs for correct positioning and exercise technique. Patient denies any increase in pain with advanced exercise. She does require rail assist for safety. Patient was able to negotiate obstacles better this session with less unsteadiness, likely due to improvement in knee discomfort. Instructed patient in balance exercise, utilizing compliant surface to challenge stance control. She does exhibit increased unsteadiness when standing on RLE due to poor ankle strategies. Patient required CGA especially with reduced rail assist and reaching outside base of support.  She would benefit from additional skilled PT Intervention to improve strength, balance and mobility;    Personal Factors and Comorbidities Comorbidity 3+    Comorbidities HTN, MS, Arthritis, COPD    Examination-Activity Limitations Bend;Carry;Continence;Lift;Squat;Stairs;Stand    Examination-Participation Restrictions Community Activity;Yard Work    Stability/Clinical Decision Making Stable/Uncomplicated    Rehab Potential Poor    PT Frequency 2x / week    PT Duration 8 weeks    PT Treatment/Interventions ADLs/Self Care Home Management;Cryotherapy;Moist Heat;DME Instruction;Gait training;Stair training;Functional mobility training;Therapeutic activities;Therapeutic exercise;Balance training;Neuromuscular re-education;Patient/family education;Manual techniques;Passive range of motion    PT Next Visit Plan Limit AMB activity if BP conitnues to go to unsafe HTN (>295JOAC systolic); review HEP for home including balance prn    PT Home Exercise Plan pt unaware of location of her original handout, has not been compliant, warrants review (12/19/20)    Consulted and Agree with Plan of Care Patient             Patient will benefit from skilled therapeutic intervention in order to improve the following deficits and impairments:  Abnormal gait, Cardiopulmonary status limiting activity, Decreased activity tolerance, Decreased balance, Decreased coordination, Decreased endurance, Decreased mobility, Decreased range of motion, Decreased strength, Difficulty walking, Impaired flexibility, Obesity, Pain  Visit Diagnosis: Other abnormalities of gait and mobility  Muscle weakness (generalized)  Unsteadiness on feet  Difficulty in walking, not elsewhere classified  Other lack of coordination  Abnormality of gait and mobility     Problem List Patient Active Problem List   Diagnosis Date Noted   Atherosclerosis of abdominal aorta ([place]) 01/10/2021   Coronary artery disease involving native  coronary artery of native heart 01/10/2021   OSA (obstructive sleep apnea) 04/19/2020   Statin intolerance 01/14/2020   S/P ORIF (open reduction internal fixation) fracture 02/20/2018   DJD (degenerative joint disease) of knee 01/02/2017   Advanced care planning/counseling discussion 01/02/2017   Thrombocytopenia ([place]) 10/30/2015   Multiple sclerosis ([place])    Essential hypertension    Hyperlipidemia    Benign neoplasm of brain ([place]) 04/30/2013   Cerebral meningioma ([place]) 04/29/2013   Malignant hypertension 04/29/2013   Obesity 04/15/2013    Trotter,Margaret, PT, DPT 03/19/2021, 2:09 PM  [facility] [address] [place], [place], [address] Phone: [phone]   Fax:  [phone]  Name: Leslie [place] MRN: [id number] Date of Birth: Aug 06, 1950

## 2021-03-21 ENCOUNTER — Ambulatory Visit: Payer: Medicare Other | Admitting: Physical Therapy

## 2021-03-21 ENCOUNTER — Other Ambulatory Visit: Payer: Self-pay

## 2021-03-21 ENCOUNTER — Encounter: Payer: Self-pay | Admitting: Physical Therapy

## 2021-03-21 DIAGNOSIS — R2689 Other abnormalities of gait and mobility: Secondary | ICD-10-CM | POA: Diagnosis not present

## 2021-03-21 DIAGNOSIS — R269 Unspecified abnormalities of gait and mobility: Secondary | ICD-10-CM

## 2021-03-21 DIAGNOSIS — R278 Other lack of coordination: Secondary | ICD-10-CM

## 2021-03-21 DIAGNOSIS — M6281 Muscle weakness (generalized): Secondary | ICD-10-CM

## 2021-03-21 DIAGNOSIS — R262 Difficulty in walking, not elsewhere classified: Secondary | ICD-10-CM

## 2021-03-21 DIAGNOSIS — R2681 Unsteadiness on feet: Secondary | ICD-10-CM

## 2021-03-21 NOTE — Therapy (Signed)
[address] MAIN [facility] SERVICES [address] [place], [place], [address] Phone: [phone]   Fax:  [phone]  Physical Therapy Treatment  Patient Details  Name: Leslie Smith MRN: [id number] Date of Birth: Apr 17, 1951 Referring Provider (PT): Dr. Darlin Priestly   Encounter Date: 03/21/2021   PT End of Session - 03/21/21 0826     Visit Number 32    Number of Visits 40    Date for PT Re-Evaluation 04/10/21    Authorization Type Medicare A&B; Medicaid 2nd; VL review after 25 visits    Authorization Time Period 07/22/22-01/09/49; previous cert 0/37/0488-8/91/6945    Authorization - Visit Number 61    Authorization - Number of Visits 25    PT Start Time 0805    PT Stop Time 0846    PT Time Calculation (min) 41 min    Equipment Utilized During Treatment Gait belt    Activity Tolerance Patient tolerated treatment well;Patient limited by fatigue    Behavior During Therapy [facility] for tasks assessed/performed             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[place]) 09/15/2014   Bladder incontinence    Cancer ([place]) 2014   brain tumor, resected   Cellulitis and abscess of leg 2019   2 episodes this year   Complication of anesthesia    DJD (degenerative joint disease) of knee 2018   Fall    Fatigue    History of blood transfusion    Hypertension    MS (multiple sclerosis) ([place])    Myelitis ([place])    Neuromuscular disorder ([place]) 2001   multiple sclerosis diagnosed by MRI   PONV (postoperative nausea and vomiting)    severe   Thrombocytopenia ([place])     Past Surgical History:  Procedure Laterality Date   ABDOMINAL HYSTERECTOMY  2011   required wound vac for 6 weeks. ovaries had grown attached to her back bone   BRAIN SURGERY  2015   tumor resection-denies seizures   BREAST SURGERY Left 2014   biopsy. negative   HARDWARE REMOVAL Left 05/05/2018   Procedure: HARDWARE REMOVAL- LEFT ANKLE;  Surgeon: Hessie Knows, MD;   Location: [facility] OR[facility];  Service: Orthopedics;  Laterality: Left;   I & D EXTREMITY Left 08/25/2015   Procedure: IRRIGATION AND DEBRIDEMENT THUMB;  Surgeon: Iran Planas, MD;  Location: [place];  Service: Orthopedics;  Laterality: Left;   ORIF ANKLE FRACTURE Left 02/20/2018   Procedure: OPEN REDUCTION INTERNAL FIXATION (ORIF) ANKLE FRACTURE;  Surgeon: Hessie Knows, MD;  Location: [facility];  Service: Orthopedics;  Laterality: Left;    There were no vitals filed for this visit.   Subjective Assessment - 03/21/21 0825     Subjective Patient reports doing well this morning. She reports minimal pain and minimal fatigue; " I ran out of time and didn't get a chance to get breakfast. I was worried I would be late."    Pertinent History Patient diagnosed with MS in 2001. Past medical history significant for HTN, MS, DJD right knee, Hyperlipidemia, Thrombocytopenia, S/p ORIF Left ankle fracture.    How long can you sit comfortably? No restrictions    How long can you stand comfortably? No baseline ADL/IADL activity limited by standing tolerance    How long can you walk comfortably? today is limited by her walk to mailbox; maybe 15 min at evaluation    Currently in Pain? No/denies    Pain Onset 1 to  4 weeks ago                       TREATMENT: Instructed patient in LE strengthening;  Standing with 3# ankle weight: -Hip flexion march x15 reps each LE; -hip abduction x15 reps each LE;  -hip extension x15 reps each LE: Required min VCS for erect posture and to increase AROM for better strengthening;  Instructed patient to reduce rail assist to challenge LE strength/motor control and increase weight bearing in LE;    Seated LAQ 3# x15 rep each LE with cues to increase ankle DF for better hamstring stretch;   Side step unsupported and reaching with ball to tap wall outside base of support x10 reps each direction, wearing 3# ankle weight for increased strengthening;  Mini squat with BUE ball  overhead press x10 reps with min Vcs for proper positioning to increase motor control and strength          Neuro Re-ed: CGA and cues for sequencing and safety awareness   Standing on airex: -forward/backward step airex to airex x10 reps unsupported with CGA- min A for safety -Side step airex to airex over orange hurdle unsupported x10 reps with CGA to min A and cues to increase step length for better foot clearance;  -Feet apart on airex pad:             Heel raises x15 reps unsupported, min VCs to keep knees extended for better weight shift;              Alternate toe taps to 8 inch step unsupported x15 reps each LE, CGA for safety             One foot on airex, one foot on 8 inch step: unsupported: BUE cone pass side/side x7 reps each LE;                 Patient tolerated well. Overall she reports less knee discomfort. She was also able to exhibit better balance and less unsteadiness negotiating obstacles, likely due to less knee pain following injection.  Patient does exhibit mild unsteadiness especially transitioning from unstable surface to unstable surface unsupported;                              PT Education - 03/21/21 0825     Education Details strength/balance, HEP    Person(s) Educated Patient    Methods Explanation;Verbal cues    Comprehension Verbalized understanding;Returned demonstration;Verbal cues required;Need further instruction              PT Short Term Goals - 03/06/21 0900       PT SHORT TERM GOAL #1   Title Pt will be independent with HEP in order to improve strength and balance in order to decrease fall risk and improve function at home and work.    Baseline 09/27/2020- Patient has no formal HEP in place, 5/19: independent; 7/12: not performing, handout is lost 8/17: depends on the day due to medication changes and pain 9/6: intermittent compliance, 9/27: intermittent compliant due MS flare up and knee pain;    Time 6    Period  Weeks    Status Partially Met    Target Date 03/27/21               PT Long Term Goals - 03/06/21 0859       PT LONG TERM GOAL #1  Title Pt will improve FOTO score from 61  to target score of 64 to display perceived improvements in ability to complete ADL's and functional mobility activities.    Baseline 09/27/2020: FOTO=61, 10/26/20: 64%; 7/12: 58 8/17: 55% 9/6 63%, 9/27: 56%    Time 8    Period Weeks    Status Partially Met    Target Date 04/10/21      PT LONG TERM GOAL #2   Title Pt will improve DGI by at least 3 points in order to demonstrate clinically significant improvement in balance and decreased risk for falls.    Baseline 09/27/2020-DGI=18/24, 5/19: 18/24 7/12: not tested; 7/15: 22/24    Time 8    Period Weeks    Status Achieved    Target Date 04/10/21      PT LONG TERM GOAL #3   Title Pt will improve ABC by at least 10 % in order to demonstrate clinically significant improvement in balance confidence.    Baseline 09/27/2020-68.1%, 5/19: 78% 7/12: not assessed; 7/15: 61.25    Time 8    Period Weeks    Status Achieved    Target Date 04/10/21      PT LONG TERM GOAL #4   Title Pt will decrease 5TSTS by at least 5 seconds (initial =23 sec with no UE support) in order to demonstrate clinically significant improvement in LE strength.    Baseline 09/27/2020= 23 sec. with No UE support, 5/19: 15 sec; 7/12: not assessed; 7/15: 17.68 sec 8/17: 13.8 seconds, 9/27: deferred due to knee pain;    Time 8    Period Weeks    Status Partially Met    Target Date 04/10/21      PT LONG TERM GOAL #5   Title Pt will decrease TUG to below 14 seconds/decrease in order to demonstrate decreased fall risk.    Baseline 09/27/2020- TUG score= 15.15 sec without UE support, 5/19: 8.89 sec; 12/19/20: not assessed; 7/15: 9.8 sec 9/6: 9.66 seconds    Time 8    Period Weeks    Status Achieved    Target Date 04/10/21      PT LONG TERM GOAL #6   Title Patient will increase six minute walk test  distance to >1000 for progression to community ambulator and improve gait ability    Baseline 4/26: 695 ft with one seated rest break, 5/19: 975 feet; 7/12: not safe to attempt, BP too high 8/17: 610 ft with multiple rest breaks, 9/27: 990 feet, high BP    Time 8    Period Weeks    Status On-going    Target Date 04/10/21      PT LONG TERM GOAL #7   Title Patient will increase Functional Gait Assessment score to >22/30 as to reduce fall risk and improve dynamic gait safety with community ambulation.    Baseline 9/6: 16/30, 9/27: Deferred due to time;    Time 8    Period Weeks    Status On-going    Target Date 04/10/21      PT LONG TERM GOAL #8   Title Patient will tolerate 15 seconds of single leg stance without loss of balance to improve ability to get in and out of shower safely.    Baseline 9/6: L 9 seconds R 7 seconds, 9/27: R: 4-5 sec, limited by knee pain, L: 18 sec    Time 8    Period Weeks    Status Partially Met    Target Date 04/10/21  Plan - 03/21/21 0835     Clinical Impression Statement Patient motivated and participated well within session. She was instructed in advanced balance tasks utilizing compliant surface to challenge stance control. Progressed exercise with increased compliant surface; Patient also instructed to reduce rail assist. She does require CGA to min A with advanced exercise. she reports mild to moderate difficulty with stepping from unstable to unstable surface. Patient also instructed in advanced LE strengthening exercise, progressing with increased repetition. She reports moderate fatigue with prolonged standing with increased shortness of breath requiring short seated rest break. Patient does require min Vcs for proper standing position for optimal strengthening. She would benefit from additional skilled PT intervention to improve strength, balance and mobility;    Personal Factors and Comorbidities Comorbidity 3+     Comorbidities HTN, MS, Arthritis, COPD    Examination-Activity Limitations Bend;Carry;Continence;Lift;Squat;Stairs;Stand    Examination-Participation Restrictions Community Activity;Yard Work    Stability/Clinical Decision Making Stable/Uncomplicated    Rehab Potential Poor    PT Frequency 2x / week    PT Duration 8 weeks    PT Treatment/Interventions ADLs/Self Care Home Management;Cryotherapy;Moist Heat;DME Instruction;Gait training;Stair training;Functional mobility training;Therapeutic activities;Therapeutic exercise;Balance training;Neuromuscular re-education;Patient/family education;Manual techniques;Passive range of motion    PT Next Visit Plan Limit AMB activity if BP conitnues to go to unsafe HTN (>[id number] systolic); review HEP for home including balance prn    PT Home Exercise Plan pt unaware of location of her original handout, has not been compliant, warrants review (12/19/20)    Consulted and Agree with Plan of Care Patient             Patient will benefit from skilled therapeutic intervention in order to improve the following deficits and impairments:  Abnormal gait, Cardiopulmonary status limiting activity, Decreased activity tolerance, Decreased balance, Decreased coordination, Decreased endurance, Decreased mobility, Decreased range of motion, Decreased strength, Difficulty walking, Impaired flexibility, Obesity, Pain  Visit Diagnosis: Other abnormalities of gait and mobility  Muscle weakness (generalized)  Unsteadiness on feet  Difficulty in walking, not elsewhere classified  Other lack of coordination  Abnormality of gait and mobility     Problem List Patient Active Problem List   Diagnosis Date Noted   Atherosclerosis of abdominal aorta ([place]) 01/10/2021   Coronary artery disease involving native coronary artery of native heart 01/10/2021   OSA (obstructive sleep apnea) 04/19/2020   Statin intolerance 01/14/2020   S/P ORIF (open reduction internal fixation)  fracture 02/20/2018   DJD (degenerative joint disease) of knee 01/02/2017   Advanced care planning/counseling discussion 01/02/2017   Thrombocytopenia ([place]) 10/30/2015   Multiple sclerosis ([place])    Essential hypertension    Hyperlipidemia    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

## 2021-03-26 ENCOUNTER — Emergency Department
Admission: EM | Admit: 2021-03-26 | Discharge: 2021-03-26 | Disposition: A | Discharge status: Home / Self Care | Payer: Medicare Other | Attending: Emergency Medicine | Admitting: Emergency Medicine

## 2021-03-26 ENCOUNTER — Other Ambulatory Visit: Payer: Self-pay

## 2021-03-26 ENCOUNTER — Encounter: Payer: Self-pay | Admitting: Radiology

## 2021-03-26 ENCOUNTER — Emergency Department: Payer: Medicare Other

## 2021-03-26 DIAGNOSIS — Z85841 Personal history of malignant neoplasm of brain: Secondary | ICD-10-CM | POA: Diagnosis not present

## 2021-03-26 DIAGNOSIS — H5789 Other specified disorders of eye and adnexa: Secondary | ICD-10-CM

## 2021-03-26 DIAGNOSIS — Z79899 Other long term (current) drug therapy: Secondary | ICD-10-CM | POA: Insufficient documentation

## 2021-03-26 DIAGNOSIS — I10 Essential (primary) hypertension: Secondary | ICD-10-CM | POA: Insufficient documentation

## 2021-03-26 DIAGNOSIS — I2510 Atherosclerotic heart disease of native coronary artery without angina pectoris: Secondary | ICD-10-CM | POA: Insufficient documentation

## 2021-03-26 DIAGNOSIS — H9202 Otalgia, left ear: Secondary | ICD-10-CM | POA: Insufficient documentation

## 2021-03-26 DIAGNOSIS — R609 Edema, unspecified: Secondary | ICD-10-CM | POA: Diagnosis not present

## 2021-03-26 DIAGNOSIS — L03213 Periorbital cellulitis: Secondary | ICD-10-CM

## 2021-03-26 LAB — COMPREHENSIVE METABOLIC PANEL
ALT: 21 U/L (ref 0–44)
AST: 16 U/L (ref 15–41)
Albumin: 3.4 g/dL — ABNORMAL LOW (ref 3.5–5.0)
Alkaline Phosphatase: 51 U/L (ref 38–126)
Anion gap: 10 (ref 5–15)
BUN: 19 mg/dL (ref 8–23)
CO2: 22 mmol/L (ref 22–32)
Calcium: 8.5 mg/dL — ABNORMAL LOW (ref 8.9–10.3)
Chloride: 108 mmol/L (ref 98–111)
Creatinine, Ser: 1.16 mg/dL — ABNORMAL HIGH (ref 0.44–1.00)
GFR, Estimated: 51 mL/min — ABNORMAL LOW (ref >60)
Glucose, Bld: 113 mg/dL — ABNORMAL HIGH (ref 70–99)
Potassium: 3.8 mmol/L (ref 3.5–5.1)
Sodium: 140 mmol/L (ref 135–145)
Total Bilirubin: 0.5 mg/dL (ref 0.3–1.2)
Total Protein: 6.4 g/dL — ABNORMAL LOW (ref 6.5–8.1)

## 2021-03-26 LAB — CBC WITH DIFFERENTIAL/PLATELET
Abs Immature Granulocytes: 0.1 10*3/uL — ABNORMAL HIGH (ref 0.00–0.07)
Basophils Absolute: 0.1 10*3/uL (ref 0.0–0.1)
Basophils Relative: 1 %
Eosinophils Absolute: 0.4 10*3/uL (ref 0.0–0.5)
Eosinophils Relative: 3 %
HCT: 32.5 % — ABNORMAL LOW (ref 36.0–46.0)
Hemoglobin: 10.9 g/dL — ABNORMAL LOW (ref 12.0–15.0)
Immature Granulocytes: 1 %
Lymphocytes Relative: 18 %
Lymphs Abs: 2.2 10*3/uL (ref 0.7–4.0)
MCH: 29.3 pg (ref 26.0–34.0)
MCHC: 33.5 g/dL (ref 30.0–36.0)
MCV: 87.4 fL (ref 80.0–100.0)
Monocytes Absolute: 0.6 10*3/uL (ref 0.1–1.0)
Monocytes Relative: 5 %
Neutro Abs: 8.6 10*3/uL — ABNORMAL HIGH (ref 1.7–7.7)
Neutrophils Relative %: 72 %
Platelets: 112 10*3/uL — ABNORMAL LOW (ref 150–400)
RBC: 3.72 MIL/uL — ABNORMAL LOW (ref 3.87–5.11)
RDW: 14.3 % (ref 11.5–15.5)
WBC: 12.1 10*3/uL — ABNORMAL HIGH (ref 4.0–10.5)
nRBC: 0 % (ref 0.0–0.2)

## 2021-03-26 MED ORDER — CLINDAMYCIN HCL 300 MG PO CAPS: 300.0000 mg | ORAL_CAPSULE | Freq: Three times a day (TID) | ORAL | 0 refills | Status: AC

## 2021-03-26 MED ORDER — SODIUM CHLORIDE 0.9 % IV SOLN
3.0000 g | Freq: Once | INTRAVENOUS | Status: AC
  Administered 2021-03-26: 3 g via INTRAVENOUS
  Filled 2021-03-26: qty 8

## 2021-03-26 MED ORDER — HYDROCODONE-ACETAMINOPHEN 5-325 MG PO TABS: 1.0000 | ORAL_TABLET | Freq: Every evening | ORAL | 0 refills | Status: DC | PRN

## 2021-03-26 MED ORDER — FLUCONAZOLE 150 MG PO TABS: 150.0000 mg | ORAL_TABLET | Freq: Every day | ORAL | 0 refills | Status: AC

## 2021-03-26 MED ORDER — IOHEXOL 350 MG/ML SOLN
75.0000 mL | Freq: Once | INTRAVENOUS | Status: AC | PRN
  Administered 2021-03-26: 75 mL via INTRAVENOUS

## 2021-03-26 MED ORDER — PROBIOTIC 250 MG PO CAPS: 1.0000 | ORAL_CAPSULE | Freq: Two times a day (BID) | ORAL | 0 refills | Status: DC

## 2021-03-26 MED ORDER — CEFDINIR 300 MG PO CAPS: 300.0000 mg | ORAL_CAPSULE | Freq: Two times a day (BID) | ORAL | 0 refills | Status: AC

## 2021-03-26 MED ORDER — MORPHINE SULFATE (PF) 4 MG/ML IV SOLN
4.0000 mg | Freq: Once | INTRAVENOUS | Status: AC
Start: 2021-03-26 — End: 2021-03-26
  Administered 2021-03-26: 4 mg via INTRAVENOUS
  Filled 2021-03-26: qty 1

## 2021-03-26 MED ORDER — CLINDAMYCIN HCL 150 MG PO CAPS
300.0000 mg | ORAL_CAPSULE | Freq: Once | ORAL | Status: AC
  Administered 2021-03-26: 300 mg via ORAL
  Filled 2021-03-26: qty 2

## 2021-03-26 NOTE — ED Provider Notes (Signed)
Patient with below listed past medical history presenting with evidence of preseptal cellulitis.  Otherwise well-appearing in no acute distress.  Was given dose of IV antibiotics here in the ER.  Believe she stable appropriate for trial of outpatient management with oral antibiotics.  We discussed strict return precautions.  Patient agreeable to plan.   Merlyn Lot, MD 03/26/21 [phone]

## 2021-03-26 NOTE — ED Provider Notes (Signed)
[facility] Emergency Department Provider Note   ____________________________________________   Event Date/Time   First MD Initiated Contact with Patient 03/26/21 0559     (approximate)  I have reviewed the triage vital signs and the nursing notes.   HISTORY  Chief Complaint Belepharitis and periorbital swelling    HPI Leslie Smith is a 70 y.o. female with past medical history of hypertension, hyperlipidemia, multiple sclerosis, and ITP who presents to the ED complaining of eye pain.  Patient reports 3 days ago she noticed a stye to her left upper eyelid.  She attempted to pop the stye but has now had increasing redness and swelling around her left eye.  She has had significant purulent drainage from the upper lid, reports feeling hot with chills, but has not taken her temperature at home.  It is difficult for her to open her eyelid, but when she is able to do so, she states that her vision is not impacted.  She is able to move her eye in all directions, but states it is painful to do so.  She has not been taking anything for her symptoms prior to arrival.        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[place]) 09/15/2014   Bladder incontinence    Cancer ([place]) 2014   brain tumor, resected   Cellulitis and abscess of leg 2019   2 episodes this year   Complication of anesthesia    DJD (degenerative joint disease) of knee 2018   Fall    Fatigue    History of blood transfusion    Hypertension    MS (multiple sclerosis) ([place])    Myelitis ([place])    Neuromuscular disorder ([place]) 2001   multiple sclerosis diagnosed by MRI   PONV (postoperative nausea and vomiting)    severe   Thrombocytopenia ([place])     Patient Active Problem List   Diagnosis Date Noted   Atherosclerosis of abdominal aorta ([place]) 01/10/2021   Coronary artery disease involving native coronary artery of native heart 01/10/2021   OSA (obstructive sleep apnea) 04/19/2020    Statin intolerance 01/14/2020   S/P ORIF (open reduction internal fixation) fracture 02/20/2018   DJD (degenerative joint disease) of knee 01/02/2017   Advanced care planning/counseling discussion 01/02/2017   Thrombocytopenia ([place]) 10/30/2015   Multiple sclerosis ([place])    Essential hypertension    Hyperlipidemia    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 ovaries had grown attached to her back bone   BRAIN SURGERY  2015   tumor resection-denies seizures   BREAST SURGERY Left 2014   biopsy. negative   HARDWARE REMOVAL Left 05/05/2018   Procedure: HARDWARE REMOVAL- LEFT ANKLE;  Surgeon: Hessie Knows, MD;  Location: [facility];  Service: Orthopedics;  Laterality: Left;   I & D EXTREMITY Left 08/25/2015   Procedure: IRRIGATION AND DEBRIDEMENT THUMB;  Surgeon: Iran Planas, MD;  Location: [place];  Service: Orthopedics;  Laterality: Left;   ORIF ANKLE FRACTURE Left 02/20/2018   Procedure: OPEN REDUCTION INTERNAL FIXATION (ORIF) ANKLE FRACTURE;  Surgeon: Hessie Knows, MD;  Location: [facility];  Service: Orthopedics;  Laterality: Left;    Prior to Admission medications   Medication Sig Start Date End Date Taking? Authorizing  Provider  Cholecalciferol (VITAMIN D-3) 25 MCG (1000 UT) CAPS Take 1,000 Units by mouth daily.    [provider]  ezetimibe (ZETIA) 10 MG tablet Take 10 mg by mouth daily. 01/17/20   [provider]  fluticasone (FLONASE) 50 MCG/ACT nasal spray Place into the nose. 01/14/20 01/30/21  [provider]  hydrALAZINE (APRESOLINE) 50 MG tablet Take 1 tablet by mouth 2 (two) times daily. 11/01/20 11/01/21  [provider]  hydrochlorothiazide (HYDRODIURIL) 25 MG tablet Take 1 tablet (25 mg total) by mouth daily. 12/28/18   Guadalupe Maple, MD  ibuprofen (IBU) 800 MG tablet Take 1 tablet (800 mg total) by mouth every 8 (eight) hours as needed for moderate pain. Patient not taking: Reported on 01/30/2021 12/28/18   Guadalupe Maple, MD  losartan (COZAAR) 100 MG tablet Take 1 tablet (100 mg total) by mouth daily. 12/28/18   Guadalupe Maple, MD  Melatonin 3 MG TABS Take 3-6 mg by mouth at bedtime. Patient not taking: Reported on 01/30/2021    [provider]  niacin 100 MG tablet Take 100 mg by mouth at bedtime. Patient not taking: Reported on 01/30/2021    [provider]  triamterene-hydrochlorothiazide (MAXZIDE) 75-50 MG tablet Take 1 tablet by mouth daily. 10/02/20   [provider]  vitamin B-12 (CYANOCOBALAMIN) 1000 MCG tablet Take 1,000 mcg by mouth daily. Patient not taking: Reported on 01/30/2021    [provider]    Allergies Ezetimibe, Watermelon [citrullus vulgaris], Adhesive [tape], Other, Atorvastatin, and Meloxicam  Family History  Problem Relation Age of Onset   Heart disease Father    Cancer Father     Social History Social History   Tobacco Use   Smoking status: Never   Smokeless tobacco: Never  Vaping Use   Vaping Use: Never used  Substance Use Topics   Alcohol use: Yes    Comment: occassional   Drug use: Yes    Types: Marijuana    Comment: uses for MS    Review of Systems  Constitutional: No fever/chills Eyes: No visual changes.  Positive for eye pain, swelling, and drainage. ENT: No sore throat. Cardiovascular: Denies chest pain. Respiratory: Denies shortness of breath. Gastrointestinal: No abdominal pain.  No nausea, no vomiting.  No diarrhea.  No constipation. Genitourinary: Negative for dysuria. Musculoskeletal: Negative for back pain. Skin: Negative for rash. Neurological: Negative for headaches, focal weakness or numbness.  ____________________________________________   PHYSICAL EXAM:  VITAL SIGNS: ED Triage Vitals  Enc Vitals Group      BP 03/26/21 0457 (!) 191/73     Pulse Rate 03/26/21 0457 69     Resp 03/26/21 0457 20     Temp 03/26/21 0457 98.4 F (36.9 C)     Temp Source 03/26/21 0457 Oral     SpO2 03/26/21 0457 95 %     Weight --      Height --      Head Circumference --      Peak Flow --      Pain Score 03/26/21 0501 9     Pain Loc --      Pain Edu? --      Excl. in [place]? --     Constitutional: Alert and oriented. Eyes: Erythema, edema, and warmth around left eye with purulent drainage from upper lid.  Extraocular movements intact but with mild discomfort.  Pupils equal, round, and reactive to light bilaterally. Head: Atraumatic. Nose: No congestion/rhinnorhea. Mouth/Throat: Mucous membranes are moist.  Neck: Normal ROM Cardiovascular: Normal rate, regular rhythm. Grossly normal heart sounds. Respiratory: Normal respiratory effort.  No retractions. Lungs CTAB. Gastrointestinal: Soft and nontender. No distention. Genitourinary: deferred Musculoskeletal: No lower extremity tenderness nor edema. Neurologic:  Normal speech and language. No gross focal neurologic deficits are appreciated. Skin:  Skin is warm, dry and intact. No rash noted. Psychiatric: Mood and affect are normal. Speech and behavior are normal.  ____________________________________________   LABS (all labs ordered are listed, but only abnormal results are displayed)  Labs Reviewed  CBC WITH DIFFERENTIAL/PLATELET - Abnormal; Notable for the following components:      Result Value   WBC 12.1 (*)    RBC 3.72 (*)    Hemoglobin 10.9 (*)    HCT 32.5 (*)    Platelets 112 (*)    Neutro Abs 8.6 (*)    Abs Immature Granulocytes 0.10 (*)    All other components within normal limits  COMPREHENSIVE METABOLIC PANEL     PROCEDURES  Procedure(s) performed (including Critical Care):  Procedures   ____________________________________________   INITIAL IMPRESSION / ASSESSMENT AND PLAN / ED COURSE      70 year old female with past  medical history of hypertension, hyperlipidemia, MS, and ITP who presents to the ED complaining of 3 days of increasing pain, swelling, and purulent drainage affecting her left eye.  She has significant erythema and edema around the eye and has mild discomfort with extraocular movements.  We will further assess with CT of orbits for orbital cellulitis versus periorbital cellulitis.  She has minimal pain with eye movements and symptoms seem more consistent with a periorbital cellulitis.  Her vision does not appear to be affected.  We will give dose of IV Unasyn along with IV morphine for pain, reassess following CT scan.  Patient turned over to oncoming provider pending additional labs and imaging results along with reassessment.      ____________________________________________   FINAL CLINICAL IMPRESSION(S) / ED DIAGNOSES  Final diagnoses:  Eye swelling     ED Discharge Orders     None        Note:  This document was prepared using Dragon voice recognition software and may include unintentional dictation errors.    Blake Divine, MD 03/26/21 [phone]

## 2021-03-26 NOTE — ED Triage Notes (Signed)
Pt states she had a stye on left upper eyelid on Friday. Pt states she now has pus draining from left eye and has periorbital swelling noted.

## 2021-03-27 ENCOUNTER — Ambulatory Visit: Payer: Medicare Other | Admitting: Physical Therapy

## 2021-03-29 ENCOUNTER — Ambulatory Visit: Payer: Medicare Other | Admitting: Physical Therapy

## 2021-04-02 ENCOUNTER — Ambulatory Visit: Payer: Medicare Other | Admitting: Physical Therapy

## 2021-04-04 ENCOUNTER — Ambulatory Visit: Payer: Medicare Other

## 2021-04-09 ENCOUNTER — Ambulatory Visit: Payer: Medicare Other

## 2021-04-09 ENCOUNTER — Other Ambulatory Visit: Payer: Self-pay

## 2021-04-11 ENCOUNTER — Encounter: Payer: Self-pay | Admitting: Physical Therapy

## 2021-04-11 ENCOUNTER — Other Ambulatory Visit: Payer: Self-pay

## 2021-04-11 ENCOUNTER — Ambulatory Visit: Payer: Medicare Other | Attending: Neurology | Admitting: Physical Therapy

## 2021-04-11 VITALS — BP 157/62

## 2021-04-11 DIAGNOSIS — M6281 Muscle weakness (generalized): Secondary | ICD-10-CM | POA: Diagnosis present

## 2021-04-11 DIAGNOSIS — R262 Difficulty in walking, not elsewhere classified: Secondary | ICD-10-CM | POA: Diagnosis present

## 2021-04-11 DIAGNOSIS — R269 Unspecified abnormalities of gait and mobility: Secondary | ICD-10-CM | POA: Insufficient documentation

## 2021-04-11 DIAGNOSIS — R278 Other lack of coordination: Secondary | ICD-10-CM | POA: Insufficient documentation

## 2021-04-11 DIAGNOSIS — R2681 Unsteadiness on feet: Secondary | ICD-10-CM | POA: Diagnosis present

## 2021-04-11 DIAGNOSIS — R2689 Other abnormalities of gait and mobility: Secondary | ICD-10-CM | POA: Diagnosis present

## 2021-04-12 NOTE — Therapy (Signed)
[facility] MAIN [facility] SERVICES [address] [place], [place], [address] Phone: [phone]   Fax:  [phone]  Physical Therapy Treatment  Patient Details  Name: Leslie Smith MRN: [id number] Date of Birth: 09-May-1951 Referring Provider (PT): Dr. Marian Sorrow   Encounter Date: 04/11/2021   PT End of Session - 04/11/21 1154     Visit Number 33    Number of Visits 56    Date for PT Re-Evaluation 06/07/21    Authorization Type Medicare A&B; Medicaid 2nd; VL review after 25 visits    Authorization Time Period 12/19/20-02/13/21; previous cert 11/24/9997-10/22/5891    Authorization - Visit Number 20    Authorization - Number of Visits 25    PT Start Time 1147    PT Stop Time 1230    PT Time Calculation (min) 43 min    Equipment Utilized During Treatment Gait belt    Activity Tolerance Patient tolerated treatment well;Patient limited by fatigue    Behavior During Therapy WFL for tasks assessed/performed             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 ovaries had grown attached to her back bone   BRAIN SURGERY  2015   tumor resection-denies seizures   BREAST SURGERY Left 2014   biopsy. negative   HARDWARE REMOVAL Left 05/05/2018   Procedure: HARDWARE REMOVAL- LEFT ANKLE;  Surgeon: Kennedy Bucker, MD;   Location: [facility] ORS;  Service: Orthopedics;  Laterality: Left;   I & D EXTREMITY Left 08/25/2015   Procedure: IRRIGATION AND DEBRIDEMENT THUMB;  Surgeon: Bradly Bienenstock, MD;  Location: [facility] OR;  Service: Orthopedics;  Laterality: Left;   ORIF ANKLE FRACTURE Left 02/20/2018   Procedure: OPEN REDUCTION INTERNAL FIXATION (ORIF) ANKLE FRACTURE;  Surgeon: Kennedy Bucker, MD;  Location: [facility] ORS;  Service: Orthopedics;  Laterality: Left;    Vitals:   04/11/21 1151  BP: (!) 157/62     Subjective Assessment - 04/11/21 1151     Subjective Patient reports feeling a little better. 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 Past medical history significant for HTN, MS, DJD right knee, Hyperlipidemia, Thrombocytopenia, S/p ORIF Left ankle fracture.    How long can you sit comfortably? No restrictions    How long can you stand comfortably? No baseline ADL/IADL activity limited by standing tolerance    How long can you walk comfortably? today is limited by her walk to mailbox; maybe 15 min at evaluation  Currently in Pain? Yes    Pain Score 4     Pain Location Knee    Pain Orientation Right    Pain Descriptors / Indicators Aching;Sore    Pain Type Chronic pain    Pain Onset More than a month ago    Pain Frequency Intermittent    Aggravating Factors  worse wth standing/walking    Pain Relieving Factors rest/pain meds    Effect of Pain on Daily Activities decreased activity tolerance;    Multiple Pain Sites No                [facility] PT Assessment - 04/12/21 0001       Observation/Other Assessments   Focus on Therapeutic Outcomes (FOTO)  62% (improved from 58% on 03/06/21)      6 Minute Walk- Baseline   BP (mmHg) 157/62      6 Minute walk- Post Test   BP (mmHg) 220/67   after 5 min  seated rest break:144/81   HR (bpm) 106   after 5 min rest dropped to 88 bpm     6 minute walk test results    Aerobic Endurance Distance Walked 1120    Endurance additional comments with 1 seated rest break   improved from 990 feet on 9/27, community ambulator, less than age group norms of 1500 feet     Standardized Balance Assessment   Standardized Balance Assessment Timed Up and Go Test;Five Times Sit to Stand    Five times sit to stand comments  14.51 sec with arms across chest (low risk for falls)      Timed Up and Go Test   Normal TUG (seconds) 8.59    TUG Comments without AD      Functional Gait  Assessment   Gait Level Surface Walks 20 ft in less than 5.5 sec, no assistive devices, good speed, no evidence for imbalance, normal gait pattern, deviates no more than 6 in outside of the 12 in walkway width.    Change in Gait Speed Able to change speed, demonstrates mild gait deviations, deviates 6-10 in outside of the 12 in walkway width, or no gait deviations, unable to achieve a major change in velocity, or uses a change in velocity, or uses an assistive device.    Gait with Horizontal Head Turns Performs head turns smoothly with slight change in gait velocity (eg, minor disruption to smooth gait path), deviates 6-10 in outside 12 in walkway width, or uses an assistive device.    Gait with Vertical Head Turns Performs task with slight change in gait velocity (eg, minor disruption to smooth gait path), deviates 6 - 10 in outside 12 in walkway width or uses assistive device    Gait and Pivot Turn Pivot turns safely in greater than 3 sec and stops with no loss of balance, or pivot turns safely within 3 sec and stops with mild imbalance, requires small steps to catch balance.    Step Over Obstacle Is able to step over one shoe box (4.5 in total height) without changing gait speed. No evidence of imbalance.    Gait with Narrow Base of Support Ambulates less than 4 steps heel to toe or cannot  perform without assistance.    Gait with Eyes Closed Walks 20 ft, slow speed, abnormal gait pattern, evidence for imbalance, deviates 10-15 in outside 12 in walkway width. Requires more than 9 sec to ambulate 20 ft.    Ambulating Backwards Walks 20 ft, uses assistive  device, slower speed, mild gait deviations, deviates 6-10 in outside 12 in walkway width.    Steps Alternating feet, must use rail.    Total Score 18               TREATMENT: Patient instructed in outcome measures to address goals, see above;  Patient does exhibit significant improvement in most areas. However despite improvement in gait distance, she does exhibit high BP due to poor conditioning. She is also considered at an increased risk for falls. Would benefit from additional intervention to improve balance and conditioning for better tolerance with ADLs.                      PT Education - 04/12/21 0829     Education Details progress towards goals, plan of care    Person(s) Educated Patient    Methods Explanation    Comprehension Verbalized understanding;Verbal cues required              PT Short Term Goals - 04/11/21 1154       PT SHORT TERM GOAL #1   Title Pt will be independent with HEP in order to improve strength and balance in order to decrease fall risk and improve function at home and work.    Baseline 09/27/2020- Patient has no formal HEP in place, 5/19: independent; 7/12: not performing, handout is lost 8/17: depends on the day due to medication changes and pain 9/6: intermittent compliance, 9/27: intermittent compliant due MS flare up and knee pain; 11/2: hasn't been doing formal HEP, but has been active; has been traveling a lot;    Time 6    Period Weeks    Status Partially Met    Target Date 05/09/21               PT Long Term Goals - 04/11/21 1154       PT LONG TERM GOAL #1   Title Pt will improve FOTO score from 61  to target score of 64 to display perceived  improvements in ability to complete ADL's and functional mobility activities.    Baseline 09/27/2020: FOTO=61, 10/26/20: 64%; 7/12: 58 8/17: 55% 9/6 63%, 9/27: 56%, 11/2: 62%    Time 8    Period Weeks    Status Partially Met    Target Date 06/06/21      PT LONG TERM GOAL #2   Title Pt will improve DGI by at least 3 points in order to demonstrate clinically significant improvement in balance and decreased risk for falls.    Baseline 09/27/2020-DGI=18/24, 5/19: 18/24 7/12: not tested; 7/15: 22/24    Time 8    Period Weeks    Status Achieved    Target Date 06/06/21      PT LONG TERM GOAL #3   Title Pt will improve ABC by at least 10 % in order to demonstrate clinically significant improvement in balance confidence.    Baseline 09/27/2020-68.1%, 5/19: 78% 7/12: not assessed; 7/15: 61.25    Time 8    Period Weeks    Status Achieved    Target Date 06/06/21      PT LONG TERM GOAL #4   Title Pt will decrease 5TSTS by at least 5 seconds (initial =23 sec with no UE support) in order to demonstrate clinically significant improvement in LE strength.    Baseline 09/27/2020= 23 sec. with No UE support, 5/19: 15 sec; 7/12: not assessed; 7/15: 17.68 sec 8/17: 13.8 seconds, 9/27:  deferred due to knee pain;, 11/2: 14.5 sec without HHA    Time 8    Period Weeks    Status Achieved    Target Date 06/06/21      PT LONG TERM GOAL #5   Title Pt will decrease TUG to below 14 seconds/decrease in order to demonstrate decreased fall risk.    Baseline 09/27/2020- TUG score= 15.15 sec without UE support, 5/19: 8.89 sec; 12/19/20: not assessed; 7/15: 9.8 sec 9/6: 9.66 seconds, 11/2: 8.89 sec    Time 8    Period Weeks    Status Achieved    Target Date 06/06/21      PT LONG TERM GOAL #6   Title Patient will increase six minute walk test distance to >1000 for progression to community ambulator and improve gait ability with BP in functional level and moderate fatigue or less;    Baseline 4/26: 695 ft with one seated  rest break, 5/19: 975 feet; 7/12: not safe to attempt, BP too high 8/17: 610 ft with multiple rest breaks, 9/27: 990 feet, high BP 11/2: 1120 with high bp    Time 8    Period Weeks    Status Revised    Target Date 06/06/21      PT LONG TERM GOAL #7   Title Patient will increase Functional Gait Assessment score to >22/30 as to reduce fall risk and improve dynamic gait safety with community ambulation.    Baseline 9/6: 16/30, 9/27: Deferred due to time; 11/2: 18/30    Time 8    Period Weeks    Status Partially Met    Target Date 06/06/21      PT LONG TERM GOAL #8   Title Patient will tolerate 15 seconds of single leg stance without loss of balance to improve ability to get in and out of shower safely.    Baseline 9/6: L 9 seconds R 7 seconds, 9/27: R: 4-5 sec, limited by knee pain, L: 18 sec, 11/2: deferred;    Time 8    Period Weeks    Status Partially Met    Target Date 06/06/21                   Plan - 04/12/21 0836     Clinical Impression Statement Patient motivated and participated well within session. She was instructed in outcome measures to address progress towards goals. Patient does exhibits significant improvement in outcome measures. She was able to walk longer distance on 6 min walk. However she continues to have elevated BP initially with increased shortness of breath and fatigue indicating poor cardiovascular conditioning. She would benefit from conditioning to help reduce elevated BP with activity. Patient also exhibits improvement in knee pain which led to improve transfer ability and mobility. She would benefit from additional skilled PT Intervention to improve strength, balance and mobility;    Personal Factors and Comorbidities Comorbidity 3+    Comorbidities HTN, MS, Arthritis, COPD    Examination-Activity Limitations Bend;Carry;Continence;Lift;Squat;Stairs;Stand    Examination-Participation Restrictions Community Activity;Yard Work    Stability/Clinical  Decision Making Stable/Uncomplicated    Rehab Potential Poor    PT Frequency 2x / week    PT Duration 8 weeks    PT Treatment/Interventions ADLs/Self Care Home Management;Cryotherapy;Moist Heat;DME Instruction;Gait training;Stair training;Functional mobility training;Therapeutic activities;Therapeutic exercise;Balance training;Neuromuscular re-education;Patient/family education;Manual techniques;Passive range of motion    PT Next Visit Plan Limit AMB activity if BP conitnues to go to unsafe HTN (>161WRUE systolic); review HEP for home including balance  prn    PT Home Exercise Plan pt unaware of location of her original handout, has not been compliant, warrants review (12/19/20)    Consulted and Agree with Plan of Care Patient             Patient will benefit from skilled therapeutic intervention in order to improve the following deficits and impairments:  Abnormal gait, Cardiopulmonary status limiting activity, Decreased activity tolerance, Decreased balance, Decreased coordination, Decreased endurance, Decreased mobility, Decreased range of motion, Decreased strength, Difficulty walking, Impaired flexibility, Obesity, Pain  Visit Diagnosis: Other abnormalities of gait and mobility  Muscle weakness (generalized)  Unsteadiness on feet  Difficulty in walking, not elsewhere classified  Other lack of coordination     Problem List Patient Active Problem List   Diagnosis Date Noted   Atherosclerosis of abdominal aorta ([place]) 01/10/2021   Coronary artery disease involving native coronary artery of native heart 01/10/2021   OSA (obstructive sleep apnea) 04/19/2020   Statin intolerance 01/14/2020   S/P ORIF (open reduction internal fixation) fracture 02/20/2018   DJD (degenerative joint disease) of knee 01/02/2017   Advanced care planning/counseling discussion 01/02/2017   Thrombocytopenia ([place]) 10/30/2015   Multiple sclerosis ([place])    Essential hypertension    Hyperlipidemia     Benign neoplasm of brain ([place]) 04/30/2013   Cerebral meningioma ([place]) 04/29/2013   Malignant hypertension 04/29/2013   Obesity 04/15/2013    Laparis Durrett, PT, DPT 04/12/2021, 8:44 AM  [facility] [address] [place], [place], [address] Phone: [phone]   Fax:  [phone]  Name: JENEL GIERKE MRN: [id number] Date of Birth: [birth year]

## 2021-04-16 ENCOUNTER — Other Ambulatory Visit: Payer: Self-pay

## 2021-04-16 ENCOUNTER — Ambulatory Visit: Payer: Medicare Other | Admitting: Physical Therapy

## 2021-04-16 ENCOUNTER — Encounter: Payer: Self-pay | Admitting: Physical Therapy

## 2021-04-16 DIAGNOSIS — R269 Unspecified abnormalities of gait and mobility: Secondary | ICD-10-CM

## 2021-04-16 DIAGNOSIS — R2681 Unsteadiness on feet: Secondary | ICD-10-CM

## 2021-04-16 DIAGNOSIS — R278 Other lack of coordination: Secondary | ICD-10-CM

## 2021-04-16 DIAGNOSIS — M6281 Muscle weakness (generalized): Secondary | ICD-10-CM

## 2021-04-16 DIAGNOSIS — R2689 Other abnormalities of gait and mobility: Secondary | ICD-10-CM

## 2021-04-16 DIAGNOSIS — R262 Difficulty in walking, not elsewhere classified: Secondary | ICD-10-CM

## 2021-04-16 NOTE — Therapy (Signed)
[place] MAIN [facility] SERVICES [address] [place], [place], [address] Phone: [phone]   Fax:  [phone]  Physical Therapy Treatment  Patient Details  Name: Leslie Smith MRN: [id number] Date of Birth: 23-Aug-1950 Referring Provider (PT): Dr. Darlin Priestly   Encounter Date: 04/16/2021   PT End of Session - 04/16/21 1157     Visit Number 34    Number of Visits 25    Date for PT Re-Evaluation 06/07/21    Authorization Type Medicare A&B; Medicaid 2nd; VL review after 25 visits    Authorization Time Period 01/09/64-10/10/72; previous cert 02/03/785-7/54/4920    Authorization - Visit Number 14    Authorization - Number of Visits 25    PT Start Time 1147    PT Stop Time 1230    PT Time Calculation (min) 43 min    Equipment Utilized During Treatment Gait belt    Activity Tolerance Patient tolerated treatment well;Patient limited by fatigue    Behavior During Therapy WFL for tasks assessed/performed             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[place]) 09/15/2014   Bladder incontinence    Cancer ([place]) 2014   brain tumor, resected   Cellulitis and abscess of leg 2019   2 episodes this year   Complication of anesthesia    DJD (degenerative joint disease) of knee 2018   Fall    Fatigue    History of blood transfusion    Hypertension    MS (multiple sclerosis) ([place])    Myelitis ([place])    Neuromuscular disorder ([place]) 2001   multiple sclerosis diagnosed by MRI   PONV (postoperative nausea and vomiting)    severe   Thrombocytopenia ([place])     Past Surgical History:  Procedure Laterality Date   ABDOMINAL HYSTERECTOMY  2011   required wound vac for 6 weeks. ovaries had grown attached to her back bone   BRAIN SURGERY  2015   tumor resection-denies seizures   BREAST SURGERY Left 2014   biopsy. negative   HARDWARE REMOVAL Left 05/05/2018   Procedure: HARDWARE REMOVAL- LEFT ANKLE;  Surgeon: Hessie Knows, MD;   Location: [facility];  Service: Orthopedics;  Laterality: Left;   I & D EXTREMITY Left 08/25/2015   Procedure: IRRIGATION AND DEBRIDEMENT THUMB;  Surgeon: Iran Planas, MD;  Location: [facility];  Service: Orthopedics;  Laterality: Left;   ORIF ANKLE FRACTURE Left 02/20/2018   Procedure: OPEN REDUCTION INTERNAL FIXATION (ORIF) ANKLE FRACTURE;  Surgeon: Hessie Knows, MD;  Location: [facility];  Service: Orthopedics;  Laterality: Left;    There were no vitals filed for this visit.   Subjective Assessment - 04/16/21 1155     Subjective She presents to therapy with her service dog. She denies any knee pain; No significant stumbles or loss of balance;    Pertinent History Patient diagnosed with MS in 2001. Past medical history significant for HTN, MS, DJD right knee, Hyperlipidemia, Thrombocytopenia, S/p ORIF Left ankle fracture.    How long can you sit comfortably? No restrictions    How long can you stand comfortably? No baseline ADL/IADL activity limited by standing tolerance    How long can you walk comfortably? today is limited by her walk to mailbox; maybe 15 min at evaluation    Currently in Pain? No/denies    Pain Onset More than a month ago  TREATMENT: Instructed patient in LE strengthening;  Standing with 3# ankle weight: -Hip flexion march x15 reps each LE; -hip abduction x15 reps each LE;  -hip extension x15 reps each LE: Required min VCS for erect posture and to increase AROM for better strengthening;  Instructed patient to reduce rail assist to challenge LE strength/motor control and increase weight bearing in LE;       Neuro Re-ed: CGA and cues for sequencing and safety awareness   Standing on airex: -forward/backward step airex to airex x10 reps unsupported with CGA- min A for safety, progressed with BUE arm lift (mimicking reaching into cabinet) x5 reps;   -Feet apart on airex pad:             Heel raises x15 reps unsupported, min VCs to keep knees  extended for better weight shift;             eyes open unsupported 30 sec hold, eyes closed 10 sec x2 reps;  Head turns side/side x5 reps -Staggered stance one foot on each airex:  Cone pass side/side x5 reps  BUE arm overhead raise while looking up x5 reps, moderate instability noted;                 Patient tolerated well. Overall she reports less knee discomfort. She does exhibit increased difficulty/instability while on airex pad. She reports feeling like her shoes (clogs) did not help her keep her balance. She had most difficulty with staggered stance with arms overhead.                          PT Education - 04/16/21 1157     Education Details balance/strength, HEP    Person(s) Educated Patient    Methods Explanation;Verbal cues    Comprehension Verbalized understanding;Returned demonstration;Verbal cues required;Need further instruction              PT Short Term Goals - 04/11/21 1154       PT SHORT TERM GOAL #1   Title Pt will be independent with HEP in order to improve strength and balance in order to decrease fall risk and improve function at home and work.    Baseline 09/27/2020- Patient has no formal HEP in place, 5/19: independent; 7/12: not performing, handout is lost 8/17: depends on the day due to medication changes and pain 9/6: intermittent compliance, 9/27: intermittent compliant due MS flare up and knee pain; 11/2: hasn't been doing formal HEP, but has been active; has been traveling a lot;    Time 6    Period Weeks    Status Partially Met    Target Date 05/09/21               PT Long Term Goals - 04/11/21 1154       PT LONG TERM GOAL #1   Title Pt will improve FOTO score from 61  to target score of 64 to display perceived improvements in ability to complete ADL's and functional mobility activities.    Baseline 09/27/2020: FOTO=61, 10/26/20: 64%; 7/12: 58 8/17: 55% 9/6 63%, 9/27: 56%, 11/2: 62%    Time 8    Period Weeks     Status Partially Met    Target Date 06/06/21      PT LONG TERM GOAL #2   Title Pt will improve DGI by at least 3 points in order to demonstrate clinically significant improvement in balance and decreased risk for falls.  Baseline 09/27/2020-DGI=18/24, 5/19: 18/24 7/12: not tested; 7/15: 22/24    Time 8    Period Weeks    Status Achieved    Target Date 06/06/21      PT LONG TERM GOAL #3   Title Pt will improve ABC by at least 10 % in order to demonstrate clinically significant improvement in balance confidence.    Baseline 09/27/2020-68.1%, 5/19: 78% 7/12: not assessed; 7/15: 61.25    Time 8    Period Weeks    Status Achieved    Target Date 06/06/21      PT LONG TERM GOAL #4   Title Pt will decrease 5TSTS by at least 5 seconds (initial =23 sec with no UE support) in order to demonstrate clinically significant improvement in LE strength.    Baseline 09/27/2020= 23 sec. with No UE support, 5/19: 15 sec; 7/12: not assessed; 7/15: 17.68 sec 8/17: 13.8 seconds, 9/27: deferred due to knee pain;, 11/2: 14.5 sec without HHA    Time 8    Period Weeks    Status Achieved    Target Date 06/06/21      PT LONG TERM GOAL #5   Title Pt will decrease TUG to below 14 seconds/decrease in order to demonstrate decreased fall risk.    Baseline 09/27/2020- TUG score= 15.15 sec without UE support, 5/19: 8.89 sec; 12/19/20: not assessed; 7/15: 9.8 sec 9/6: 9.66 seconds, 11/2: 8.89 sec    Time 8    Period Weeks    Status Achieved    Target Date 06/06/21      PT LONG TERM GOAL #6   Title Patient will increase six minute walk test distance to >1000 for progression to community ambulator and improve gait ability with BP in functional level and moderate fatigue or less;    Baseline 4/26: 695 ft with one seated rest break, 5/19: 975 feet; 7/12: not safe to attempt, BP too high 8/17: 610 ft with multiple rest breaks, 9/27: 990 feet, high BP 11/2: 1120 with high bp    Time 8    Period Weeks    Status Revised     Target Date 06/06/21      PT LONG TERM GOAL #7   Title Patient will increase Functional Gait Assessment score to >22/30 as to reduce fall risk and improve dynamic gait safety with community ambulation.    Baseline 9/6: 16/30, 9/27: Deferred due to time; 11/2: 18/30    Time 8    Period Weeks    Status Partially Met    Target Date 06/06/21      PT LONG TERM GOAL #8   Title Patient will tolerate 15 seconds of single leg stance without loss of balance to improve ability to get in and out of shower safely.    Baseline 9/6: L 9 seconds R 7 seconds, 9/27: R: 4-5 sec, limited by knee pain, L: 18 sec, 11/2: deferred;    Time 8    Period Weeks    Status Partially Met    Target Date 06/06/21                   Plan - 04/16/21 1415     Clinical Impression Statement Patient motivated and participated well within session. She does have difficulty with advanced balance tasks while on compliant surface with increased ankle instability. She did wear clogs to therapy which affected her balance on foam pads as well. Patient had most difficulty with narrow base of support and raising  arms overhead looking up towards ceiling. She required CGA to min A for safety. PT instructed patient in advanced LE strengthening. Instructed patient to reduce rail assist to increase weight bearing in BLE for better strengthening. patient does become short of breath with advanced exercise requiring short seated rest break. She is following up with PCP regarding kidney function later this week. She would benefit from additional skilled PT Intervention to improve strength, balance and mobility;    Personal Factors and Comorbidities Comorbidity 3+    Comorbidities HTN, MS, Arthritis, COPD    Examination-Activity Limitations Bend;Carry;Continence;Lift;Squat;Stairs;Stand    Examination-Participation Restrictions Community Activity;Yard Work    Stability/Clinical Decision Making Stable/Uncomplicated    Rehab Potential Poor     PT Frequency 2x / week    PT Duration 8 weeks    PT Treatment/Interventions ADLs/Self Care Home Management;Cryotherapy;Moist Heat;DME Instruction;Gait training;Stair training;Functional mobility training;Therapeutic activities;Therapeutic exercise;Balance training;Neuromuscular re-education;Patient/family education;Manual techniques;Passive range of motion    PT Next Visit Plan Limit AMB activity if BP conitnues to go to unsafe HTN (>502DXAJ systolic); review HEP for home including balance prn    PT Home Exercise Plan pt unaware of location of her original handout, has not been compliant, warrants review (12/19/20)    Consulted and Agree with Plan of Care Patient             Patient will benefit from skilled therapeutic intervention in order to improve the following deficits and impairments:  Abnormal gait, Cardiopulmonary status limiting activity, Decreased activity tolerance, Decreased balance, Decreased coordination, Decreased endurance, Decreased mobility, Decreased range of motion, Decreased strength, Difficulty walking, Impaired flexibility, Obesity, Pain  Visit Diagnosis: Other abnormalities of gait and mobility  Muscle weakness (generalized)  Unsteadiness on feet  Difficulty in walking, not elsewhere classified  Other lack of coordination  Abnormality of gait and mobility     Problem List Patient Active Problem List   Diagnosis Date Noted   Atherosclerosis of abdominal aorta ([place]) 01/10/2021   Coronary artery disease involving native coronary artery of native heart 01/10/2021   OSA (obstructive sleep apnea) 04/19/2020   Statin intolerance 01/14/2020   S/P ORIF (open reduction internal fixation) fracture 02/20/2018   DJD (degenerative joint disease) of knee 01/02/2017   Advanced care planning/counseling discussion 01/02/2017   Thrombocytopenia ([place]) 10/30/2015   Multiple sclerosis ([place])    Essential hypertension    Hyperlipidemia    Benign neoplasm of brain ([place])  04/30/2013   Cerebral meningioma ([place]) 04/29/2013   Malignant hypertension 04/29/2013   Obesity 04/15/2013    Demarrion Meiklejohn, PT, DPT 04/16/2021, 2:18 PM  [place] [facility] MAIN [facility] SERVICES [address] [place], [place], [address] Phone: [phone]   Fax:  [phone]  Name: NEISHA HINGER MRN: [id number] Date of Birth: [birth year]

## 2021-04-18 ENCOUNTER — Other Ambulatory Visit: Payer: Self-pay

## 2021-04-18 ENCOUNTER — Ambulatory Visit: Payer: Medicare Other

## 2021-04-18 DIAGNOSIS — R2681 Unsteadiness on feet: Secondary | ICD-10-CM

## 2021-04-18 DIAGNOSIS — R2689 Other abnormalities of gait and mobility: Secondary | ICD-10-CM | POA: Diagnosis not present

## 2021-04-18 DIAGNOSIS — M6281 Muscle weakness (generalized): Secondary | ICD-10-CM

## 2021-04-18 DIAGNOSIS — R269 Unspecified abnormalities of gait and mobility: Secondary | ICD-10-CM

## 2021-04-18 NOTE — Therapy (Signed)
[place] MAIN [facility] SERVICES [address] [place], [place], [address] Phone: [phone]   Fax:  [phone]  Physical Therapy Treatment  Patient Details  Name: Leslie Smith MRN: [id number] Date of Birth: 1951/01/20 Referring Provider (PT): Dr. Darlin Priestly   Encounter Date: 04/18/2021   PT End of Session - 04/18/21 1157     Visit Number 35    Number of Visits 50    Date for PT Re-Evaluation 06/07/21    Authorization Type Medicare A&B; Medicaid 2nd; VL review after 25 visits    Authorization Time Period 0/53/97-11/14/32; previous cert 1/93/7902-09/17/7351    Authorization - Visit Number 47    Authorization - Number of Visits 25    PT Start Time 2992    PT Stop Time 1058    PT Time Calculation (min) 43 min    Equipment Utilized During Treatment Gait belt    Activity Tolerance Patient tolerated treatment well;Patient limited by fatigue    Behavior During Therapy WFL for tasks assessed/performed             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[place]) 09/15/2014   Bladder incontinence    Cancer ([place]) 2014   brain tumor, resected   Cellulitis and abscess of leg 2019   2 episodes this year   Complication of anesthesia    DJD (degenerative joint disease) of knee 2018   Fall    Fatigue    History of blood transfusion    Hypertension    MS (multiple sclerosis) ([place])    Myelitis ([place])    Neuromuscular disorder ([place]) 2001   multiple sclerosis diagnosed by MRI   PONV (postoperative nausea and vomiting)    severe   Thrombocytopenia ([place])     Past Surgical History:  Procedure Laterality Date   ABDOMINAL HYSTERECTOMY  2011   required wound vac for 6 weeks. ovaries had grown attached to her back bone   BRAIN SURGERY  2015   tumor resection-denies seizures   BREAST SURGERY Left 2014   biopsy. negative   HARDWARE REMOVAL Left 05/05/2018   Procedure: HARDWARE REMOVAL- LEFT ANKLE;  Surgeon: Hessie Knows, MD;   Location: [facility];  Service: Orthopedics;  Laterality: Left;   I & D EXTREMITY Left 08/25/2015   Procedure: IRRIGATION AND DEBRIDEMENT THUMB;  Surgeon: Iran Planas, MD;  Location: [facility];  Service: Orthopedics;  Laterality: Left;   ORIF ANKLE FRACTURE Left 02/20/2018   Procedure: OPEN REDUCTION INTERNAL FIXATION (ORIF) ANKLE FRACTURE;  Surgeon: Hessie Knows, MD;  Location: [facility];  Service: Orthopedics;  Laterality: Left;    There were no vitals filed for this visit.   Subjective Assessment - 04/18/21 1017     Subjective Patient reports 7/10 fatigue today due to spending all yesterday baking for church event this weekend. Reports 4/10 soreness in back today. No stumbles or LOB since last appointment.    Pertinent History Patient diagnosed with MS in 2001. Past medical history significant for HTN, MS, DJD right knee, Hyperlipidemia, Thrombocytopenia, S/p ORIF Left ankle fracture.    How long can you sit comfortably? No restrictions    How long can you stand comfortably? No baseline ADL/IADL activity limited by standing tolerance    How long can you walk comfortably? today is limited by her walk to mailbox; maybe 15 min at evaluation    Currently in Pain? Yes    Pain Score 4     Pain Location  Back    Pain Type Chronic pain    Pain Onset More than a month ago            BP seated pre session: 145/66 85bpm post session: 166/77 79bpm  TherEx: NuStep for Cardiovascular endurance, level 3x 109min, rpm>60bpm Standing hip marches with 3# ankle weights x 30 total Standing step ups to 6in step with 3# ankle weights and alternate arm reaching overhead for sticky note on wall. 1x10 each leg RTB hip abduction walks x 6 lengths of parallel bar STS training 1x10 with slow eccentric cuing and weighted blue med ball.   Neuro Re-Ed: SL balance 2x30sec each side. UE support needed during LLE stance. On airex; reaching for stickies to right and left outside BOS: 1x60sec with normal BOS, 1x60sec  with narrow BOS Tandem stance on Airex 2x30sec, alternating forward foot. Balloon Toss on airex with close CGA: Trial 1 x60sec normal BOS, Trial 2 x60sec narrow BOS, Trial 3 x2 min narrow BOS.  Pt educated throughout session about proper posture and technique with exercises. Improved exercise technique, movement at target joints, use of target muscles after min to mod verbal, visual, tactile cues.    Patient is pleasant and highly motivated during session today. She tolerated balance progressions on unstable surfaces well and mostly appears challenged by tandem stance/narrow BOS tasks. Patient was mildly limited by intermittent left LE pain and fatigue. Patient will continue to benefit from skilled PT to address functional endurance, balance, and gait deviations to improve safety, decrease falls risk, and improve quality of life.                     PT Education - 04/18/21 1157     Education Details Exercise technique    Person(s) Educated Patient    Methods Explanation;Demonstration;Tactile cues;Verbal cues    Comprehension Verbal cues required;Returned demonstration;Verbalized understanding;Tactile cues required              PT Short Term Goals - 04/11/21 1154       PT SHORT TERM GOAL #1   Title Pt will be independent with HEP in order to improve strength and balance in order to decrease fall risk and improve function at home and work.    Baseline 09/27/2020- Patient has no formal HEP in place, 5/19: independent; 7/12: not performing, handout is lost 8/17: depends on the day due to medication changes and pain 9/6: intermittent compliance, 9/27: intermittent compliant due MS flare up and knee pain; 11/2: hasn't been doing formal HEP, but has been active; has been traveling a lot;    Time 6    Period Weeks    Status Partially Met    Target Date 05/09/21               PT Long Term Goals - 04/11/21 1154       PT LONG TERM GOAL #1   Title Pt will improve  FOTO score from 61  to target score of 64 to display perceived improvements in ability to complete ADL's and functional mobility activities.    Baseline 09/27/2020: FOTO=61, 10/26/20: 64%; 7/12: 58 8/17: 55% 9/6 63%, 9/27: 56%, 11/2: 62%    Time 8    Period Weeks    Status Partially Met    Target Date 06/06/21      PT LONG TERM GOAL #2   Title Pt will improve DGI by at least 3 points in order to demonstrate clinically significant improvement in balance and  decreased risk for falls.    Baseline 09/27/2020-DGI=18/24, 5/19: 18/24 7/12: not tested; 7/15: 22/24    Time 8    Period Weeks    Status Achieved    Target Date 06/06/21      PT LONG TERM GOAL #3   Title Pt will improve ABC by at least 10 % in order to demonstrate clinically significant improvement in balance confidence.    Baseline 09/27/2020-68.1%, 5/19: 78% 7/12: not assessed; 7/15: 61.25    Time 8    Period Weeks    Status Achieved    Target Date 06/06/21      PT LONG TERM GOAL #4   Title Pt will decrease 5TSTS by at least 5 seconds (initial =23 sec with no UE support) in order to demonstrate clinically significant improvement in LE strength.    Baseline 09/27/2020= 23 sec. with No UE support, 5/19: 15 sec; 7/12: not assessed; 7/15: 17.68 sec 8/17: 13.8 seconds, 9/27: deferred due to knee pain;, 11/2: 14.5 sec without HHA    Time 8    Period Weeks    Status Achieved    Target Date 06/06/21      PT LONG TERM GOAL #5   Title Pt will decrease TUG to below 14 seconds/decrease in order to demonstrate decreased fall risk.    Baseline 09/27/2020- TUG score= 15.15 sec without UE support, 5/19: 8.89 sec; 12/19/20: not assessed; 7/15: 9.8 sec 9/6: 9.66 seconds, 11/2: 8.89 sec    Time 8    Period Weeks    Status Achieved    Target Date 06/06/21      PT LONG TERM GOAL #6   Title Patient will increase six minute walk test distance to >1000 for progression to community ambulator and improve gait ability with BP in functional level and  moderate fatigue or less;    Baseline 4/26: 695 ft with one seated rest break, 5/19: 975 feet; 7/12: not safe to attempt, BP too high 8/17: 610 ft with multiple rest breaks, 9/27: 990 feet, high BP 11/2: 1120 with high bp    Time 8    Period Weeks    Status Revised    Target Date 06/06/21      PT LONG TERM GOAL #7   Title Patient will increase Functional Gait Assessment score to >22/30 as to reduce fall risk and improve dynamic gait safety with community ambulation.    Baseline 9/6: 16/30, 9/27: Deferred due to time; 11/2: 18/30    Time 8    Period Weeks    Status Partially Met    Target Date 06/06/21      PT LONG TERM GOAL #8   Title Patient will tolerate 15 seconds of single leg stance without loss of balance to improve ability to get in and out of shower safely.    Baseline 9/6: L 9 seconds R 7 seconds, 9/27: R: 4-5 sec, limited by knee pain, L: 18 sec, 11/2: deferred;    Time 8    Period Weeks    Status Partially Met    Target Date 06/06/21                   Plan - 04/18/21 1159     Clinical Impression Statement Patient is pleasant and highly motivated during session today. She tolerated balance progressions on unstable surfaces well and mostly appears challenged by tandem stance/narrow BOS tasks. Patient was mildly limited by intermittent left LE pain and fatigue. Patient will continue to benefit  from skilled PT to address functional endurance, balance, and gait deviations to improve safety, decrease falls risk, and improve quality of life.    Personal Factors and Comorbidities Comorbidity 3+    Comorbidities HTN, MS, Arthritis, COPD    Examination-Activity Limitations Bend;Carry;Continence;Lift;Squat;Stairs;Stand    Examination-Participation Restrictions Community Activity;Yard Work    Stability/Clinical Decision Making Stable/Uncomplicated    Rehab Potential Poor    PT Frequency 2x / week    PT Duration 8 weeks    PT Treatment/Interventions ADLs/Self Care Home  Management;Cryotherapy;Moist Heat;DME Instruction;Gait training;Stair training;Functional mobility training;Therapeutic activities;Therapeutic exercise;Balance training;Neuromuscular re-education;Patient/family education;Manual techniques;Passive range of motion    PT Next Visit Plan Limit AMB activity if BP conitnues to go to unsafe HTN (>[id number] systolic); review HEP for home including balance prn    PT Home Exercise Plan pt unaware of location of her original handout, has not been compliant, warrants review (12/19/20)    Consulted and Agree with Plan of Care Patient             Patient will benefit from skilled therapeutic intervention in order to improve the following deficits and impairments:  Abnormal gait, Cardiopulmonary status limiting activity, Decreased activity tolerance, Decreased balance, Decreased coordination, Decreased endurance, Decreased mobility, Decreased range of motion, Decreased strength, Difficulty walking, Impaired flexibility, Obesity, Pain  Visit Diagnosis: Unsteadiness on feet  Abnormality of gait and mobility  Muscle weakness (generalized)     Problem List Patient Active Problem List   Diagnosis Date Noted   Atherosclerosis of abdominal aorta ([place]) 01/10/2021   Coronary artery disease involving native coronary artery of native heart 01/10/2021   OSA (obstructive sleep apnea) 04/19/2020   Statin intolerance 01/14/2020   S/P ORIF (open reduction internal fixation) fracture 02/20/2018   DJD (degenerative joint disease) of knee 01/02/2017   Advanced care planning/counseling discussion 01/02/2017   Thrombocytopenia ([place]) 10/30/2015   Multiple sclerosis ([place])    Essential hypertension    Hyperlipidemia    Benign neoplasm of brain ([place]) 04/30/2013   Cerebral meningioma ([place]) 04/29/2013   Malignant hypertension 04/29/2013   Obesity 04/15/2013   Arsenio Katz, SPT  This entire session was performed under direct supervision and direction of a  licensed therapist/therapist assistant . I have personally read, edited and approve of the note as written.  Janna Arch, PT, DPT  04/18/2021, 12:09 PM  [place] MAIN [facility] SERVICES [address] [place], [place], [address] Phone: [phone]   Fax:  [phone]  Name: Leslie Smith MRN: [id number] Date of Birth: 05/31/1951

## 2021-04-23 ENCOUNTER — Ambulatory Visit: Payer: Medicare Other | Admitting: Physical Therapy

## 2021-04-23 ENCOUNTER — Encounter: Payer: Self-pay | Admitting: Physical Therapy

## 2021-04-23 ENCOUNTER — Other Ambulatory Visit: Payer: Self-pay

## 2021-04-23 DIAGNOSIS — R262 Difficulty in walking, not elsewhere classified: Secondary | ICD-10-CM

## 2021-04-23 DIAGNOSIS — M6281 Muscle weakness (generalized): Secondary | ICD-10-CM

## 2021-04-23 DIAGNOSIS — R278 Other lack of coordination: Secondary | ICD-10-CM

## 2021-04-23 DIAGNOSIS — R2681 Unsteadiness on feet: Secondary | ICD-10-CM

## 2021-04-23 DIAGNOSIS — R2689 Other abnormalities of gait and mobility: Secondary | ICD-10-CM

## 2021-04-23 NOTE — Therapy (Signed)
[place] [facility] SERVICES [address] [place], [place], [address] Phone: [phone]   Fax:  [phone]  Physical Therapy Treatment  Patient Details  Name: Leslie Smith MRN: [id number] Date of Birth: 1950-11-29 Referring Provider (PT): Dr. Darlin Priestly   Encounter Date: 04/23/2021   PT End of Session - 04/23/21 1158     Visit Number 36    Number of Visits 72    Date for PT Re-Evaluation 06/07/21    Authorization Type Medicare A&B; Medicaid 2nd; VL review after 25 visits    Authorization Time Period 1/54/00-01/14/75; previous cert 1/95/0932-6/71/2458    Authorization - Visit Number 76    Authorization - Number of Visits 25    PT Start Time 1150    PT Stop Time 1230    PT Time Calculation (min) 40 min    Equipment Utilized During Treatment Gait belt    Activity Tolerance Patient tolerated treatment well;Patient limited by fatigue    Behavior During Therapy WFL for tasks assessed/performed             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[place]) 09/15/2014   Bladder incontinence    Cancer ([place]) 2014   brain tumor, resected   Cellulitis and abscess of leg 2019   2 episodes this year   Complication of anesthesia    DJD (degenerative joint disease) of knee 2018   Fall    Fatigue    History of blood transfusion    Hypertension    MS (multiple sclerosis) ([place])    Myelitis ([place])    Neuromuscular disorder ([place]) 2001   multiple sclerosis diagnosed by MRI   PONV (postoperative nausea and vomiting)    severe   Thrombocytopenia ([place])     Past Surgical History:  Procedure Laterality Date   ABDOMINAL HYSTERECTOMY  2011   required wound vac for 6 weeks. ovaries had grown attached to her back bone   BRAIN SURGERY  2015   tumor resection-denies seizures   BREAST SURGERY Left 2014   biopsy. negative   HARDWARE REMOVAL Left 05/05/2018   Procedure: HARDWARE REMOVAL- LEFT ANKLE;  Surgeon: Hessie Knows, MD;   Location: [facility];  Service: Orthopedics;  Laterality: Left;   I & D EXTREMITY Left 08/25/2015   Procedure: IRRIGATION AND DEBRIDEMENT THUMB;  Surgeon: Iran Planas, MD;  Location: [place];  Service: Orthopedics;  Laterality: Left;   ORIF ANKLE FRACTURE Left 02/20/2018   Procedure: OPEN REDUCTION INTERNAL FIXATION (ORIF) ANKLE FRACTURE;  Surgeon: Hessie Knows, MD;  Location: [facility];  Service: Orthopedics;  Laterality: Left;    There were no vitals filed for this visit.   Subjective Assessment - 04/23/21 1154     Subjective Patient reports doing okay. She reports minimal fatigue; She reports usual soreness. She did get in the hot tub this morning which has helped;    Pertinent History Patient diagnosed with MS in 2001. Past medical history significant for HTN, MS, DJD right knee, Hyperlipidemia, Thrombocytopenia, S/p ORIF Left ankle fracture.    How long can you sit comfortably? No restrictions    How long can you stand comfortably? No baseline ADL/IADL activity limited by standing tolerance    How long can you walk comfortably? today is limited by her walk to mailbox; maybe 15 min at evaluation    Currently in Pain? No/denies    Pain Onset More than a month ago    Multiple Pain Sites No  TREATMENT: Instructed patient in LE strengthening;  Standing with 3# ankle weight: -Hip flexion march x15 reps each LE; -hip abduction x15 reps each LE;  -hip extension x15 reps each LE: -mini squat x15 reps with cues for proper positioning;  Required min VCS for erect posture and to increase AROM for better strengthening;  Instructed patient to reduce rail assist to challenge LE strength/motor control and increase weight bearing in LE;   Assessed vitals after exercise: BP: 153/56, HR 70      Neuro Re-ed: CGA and cues for sequencing and safety awareness   Gait in hallway: Forward with head turns side/side calling out letters on wall x 80 feet x2 laps Forward walking  with ball toss/catch x40 feet x2 laps Backward walking with ball toss/catch x40 feet x2 laps Pt required close supervision and cues to avoid veering side/side and increase gaze stabilization for better dynamic balance; Side stepping x8 feet with UE tap outside base of support tapping post it side/side x10 reps with close supervision; Patient required cues to increase speed to challenge dynamic balance;  Patient reports increased shortness of breath and fatigue with prolonged dynamic balance in hallway; BP: 179/46, HR 80 After short rest break: 142/53    Standing on airex: -forward/backward step airex to airex, stepping over orange hurdle x10 reps  -side stepping airex to airex over orange hurdle x10 reps each direction Patient required rail assist for safety and cues to increase step length for better foot clearance;   Patient tolerated well. Overall she reports less knee discomfort. She does exhibit increased difficulty/instability while on airex pad. She continues to get short of breath with advanced exercise requiring short rest breaks. Vitals monitored. While BP was elevated it was not as high as during 6 min walk testing. Patient would benefit from additional skilled PT Intervention to improve strength, balance and cardiovascular conditioning;                             PT Education - 04/23/21 1157     Education Details exercise technique; positioning;    Person(s) Educated Patient    Methods Explanation;Verbal cues    Comprehension Verbalized understanding;Returned demonstration;Verbal cues required;Need further instruction              PT Short Term Goals - 04/11/21 1154       PT SHORT TERM GOAL #1   Title Pt will be independent with HEP in order to improve strength and balance in order to decrease fall risk and improve function at home and work.    Baseline 09/27/2020- Patient has no formal HEP in place, 5/19: independent; 7/12: not performing,  handout is lost 8/17: depends on the day due to medication changes and pain 9/6: intermittent compliance, 9/27: intermittent compliant due MS flare up and knee pain; 11/2: hasn't been doing formal HEP, but has been active; has been traveling a lot;    Time 6    Period Weeks    Status Partially Met    Target Date 05/09/21               PT Long Term Goals - 04/11/21 1154       PT LONG TERM GOAL #1   Title Pt will improve FOTO score from 61  to target score of 64 to display perceived improvements in ability to complete ADL's and functional mobility activities.    Baseline 09/27/2020: FOTO=61, 10/26/20: 64%; 7/12: 58 8/17:  55% 9/6 63%, 9/27: 56%, 11/2: 62%    Time 8    Period Weeks    Status Partially Met    Target Date 06/06/21      PT LONG TERM GOAL #2   Title Pt will improve DGI by at least 3 points in order to demonstrate clinically significant improvement in balance and decreased risk for falls.    Baseline 09/27/2020-DGI=18/24, 5/19: 18/24 7/12: not tested; 7/15: 22/24    Time 8    Period Weeks    Status Achieved    Target Date 06/06/21      PT LONG TERM GOAL #3   Title Pt will improve ABC by at least 10 % in order to demonstrate clinically significant improvement in balance confidence.    Baseline 09/27/2020-68.1%, 5/19: 78% 7/12: not assessed; 7/15: 61.25    Time 8    Period Weeks    Status Achieved    Target Date 06/06/21      PT LONG TERM GOAL #4   Title Pt will decrease 5TSTS by at least 5 seconds (initial =23 sec with no UE support) in order to demonstrate clinically significant improvement in LE strength.    Baseline 09/27/2020= 23 sec. with No UE support, 5/19: 15 sec; 7/12: not assessed; 7/15: 17.68 sec 8/17: 13.8 seconds, 9/27: deferred due to knee pain;, 11/2: 14.5 sec without HHA    Time 8    Period Weeks    Status Achieved    Target Date 06/06/21      PT LONG TERM GOAL #5   Title Pt will decrease TUG to below 14 seconds/decrease in order to demonstrate  decreased fall risk.    Baseline 09/27/2020- TUG score= 15.15 sec without UE support, 5/19: 8.89 sec; 12/19/20: not assessed; 7/15: 9.8 sec 9/6: 9.66 seconds, 11/2: 8.89 sec    Time 8    Period Weeks    Status Achieved    Target Date 06/06/21      PT LONG TERM GOAL #6   Title Patient will increase six minute walk test distance to >1000 for progression to community ambulator and improve gait ability with BP in functional level and moderate fatigue or less;    Baseline 4/26: 695 ft with one seated rest break, 5/19: 975 feet; 7/12: not safe to attempt, BP too high 8/17: 610 ft with multiple rest breaks, 9/27: 990 feet, high BP 11/2: 1120 with high bp    Time 8    Period Weeks    Status Revised    Target Date 06/06/21      PT LONG TERM GOAL #7   Title Patient will increase Functional Gait Assessment score to >22/30 as to reduce fall risk and improve dynamic gait safety with community ambulation.    Baseline 9/6: 16/30, 9/27: Deferred due to time; 11/2: 18/30    Time 8    Period Weeks    Status Partially Met    Target Date 06/06/21      PT LONG TERM GOAL #8   Title Patient will tolerate 15 seconds of single leg stance without loss of balance to improve ability to get in and out of shower safely.    Baseline 9/6: L 9 seconds R 7 seconds, 9/27: R: 4-5 sec, limited by knee pain, L: 18 sec, 11/2: deferred;    Time 8    Period Weeks    Status Partially Met    Target Date 06/06/21  Patient will benefit from skilled therapeutic intervention in order to improve the following deficits and impairments:     Visit Diagnosis: Unsteadiness on feet  Muscle weakness (generalized)  Other abnormalities of gait and mobility  Difficulty in walking, not elsewhere classified  Other lack of coordination     Problem List Patient Active Problem List   Diagnosis Date Noted   Atherosclerosis of abdominal aorta ([place]) 01/10/2021   Coronary artery disease involving native  coronary artery of native heart 01/10/2021   OSA (obstructive sleep apnea) 04/19/2020   Statin intolerance 01/14/2020   S/P ORIF (open reduction internal fixation) fracture 02/20/2018   DJD (degenerative joint disease) of knee 01/02/2017   Advanced care planning/counseling discussion 01/02/2017   Thrombocytopenia ([place]) 10/30/2015   Multiple sclerosis ([place])    Essential hypertension    Hyperlipidemia    Benign neoplasm of brain ([place]) 04/30/2013   Cerebral meningioma ([place]) 04/29/2013   Malignant hypertension 04/29/2013   Obesity 04/15/2013    Arti Trang, PT, DPT 04/23/2021, 2:10 PM  [facility] [place] [address] [place], [place], [address] Phone: [phone]   Fax:  [phone]  Name: Leslie Smith MRN: [id number] Date of Birth: [birth year]

## 2021-04-25 ENCOUNTER — Ambulatory Visit: Payer: Medicare Other | Admitting: Physical Therapy

## 2021-04-30 ENCOUNTER — Encounter: Payer: Self-pay | Admitting: Physical Therapy

## 2021-04-30 ENCOUNTER — Ambulatory Visit: Payer: Medicare Other | Admitting: Physical Therapy

## 2021-04-30 ENCOUNTER — Other Ambulatory Visit: Payer: Self-pay

## 2021-04-30 DIAGNOSIS — R2689 Other abnormalities of gait and mobility: Secondary | ICD-10-CM

## 2021-04-30 DIAGNOSIS — R2681 Unsteadiness on feet: Secondary | ICD-10-CM

## 2021-04-30 DIAGNOSIS — M6281 Muscle weakness (generalized): Secondary | ICD-10-CM

## 2021-04-30 DIAGNOSIS — R262 Difficulty in walking, not elsewhere classified: Secondary | ICD-10-CM

## 2021-04-30 DIAGNOSIS — R278 Other lack of coordination: Secondary | ICD-10-CM

## 2021-04-30 NOTE — Therapy (Signed)
[place] MAIN [facility] SERVICES [address] [place], [place], [address] Phone: [phone]   Fax:  [phone]  Physical Therapy Treatment  Patient Details  Name: Leslie Smith MRN: [id number] Date of Birth: 1951-02-08 Referring Provider (PT): Dr. Darlin Priestly   Encounter Date: 04/30/2021   PT End of Session - 04/30/21 1150     Visit Number 37    Number of Visits 12    Date for PT Re-Evaluation 06/07/21    Authorization Type Medicare A&B; Medicaid 2nd; VL review after 25 visits    Authorization Time Period 1/63/84-10/11/62; previous cert 6/80/3212-2/48/2500    Authorization - Visit Number 2    Authorization - Number of Visits 25    PT Start Time 1146    PT Stop Time 1230    PT Time Calculation (min) 44 min    Equipment Utilized During Treatment Gait belt    Activity Tolerance Patient tolerated treatment well;Patient limited by fatigue    Behavior During Therapy WFL for tasks assessed/performed             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[place]) 09/15/2014   Bladder incontinence    Cancer ([place]) 2014   brain tumor, resected   Cellulitis and abscess of leg 2019   2 episodes this year   Complication of anesthesia    DJD (degenerative joint disease) of knee 2018   Fall    Fatigue    History of blood transfusion    Hypertension    MS (multiple sclerosis) ([place])    Myelitis ([place])    Neuromuscular disorder ([place]) 2001   multiple sclerosis diagnosed by MRI   PONV (postoperative nausea and vomiting)    severe   Thrombocytopenia ([place])     Past Surgical History:  Procedure Laterality Date   ABDOMINAL HYSTERECTOMY  2011   required wound vac for 6 weeks. ovaries had grown attached to her back bone   BRAIN SURGERY  2015   tumor resection-denies seizures   BREAST SURGERY Left 2014   biopsy. negative   HARDWARE REMOVAL Left 05/05/2018   Procedure: HARDWARE REMOVAL- LEFT ANKLE;  Surgeon: Hessie Knows, MD;   Location: [facility];  Service: Orthopedics;  Laterality: Left;   I & D EXTREMITY Left 08/25/2015   Procedure: IRRIGATION AND DEBRIDEMENT THUMB;  Surgeon: Iran Planas, MD;  Location: [place];  Service: Orthopedics;  Laterality: Left;   ORIF ANKLE FRACTURE Left 02/20/2018   Procedure: OPEN REDUCTION INTERNAL FIXATION (ORIF) ANKLE FRACTURE;  Surgeon: Hessie Knows, MD;  Location: [facility];  Service: Orthopedics;  Laterality: Left;    There were no vitals filed for this visit.   Subjective Assessment - 04/30/21 1148     Subjective Patient reports doing okay. Her heat broke at home and is now using portable heaters. She reports some stiffness but not much pain;    Pertinent History Patient diagnosed with MS in 2001. Past medical history significant for HTN, MS, DJD right knee, Hyperlipidemia, Thrombocytopenia, S/p ORIF Left ankle fracture.    How long can you sit comfortably? No restrictions    How long can you stand comfortably? No baseline ADL/IADL activity limited by standing tolerance    How long can you walk comfortably? today is limited by her walk to mailbox; maybe 15 min at evaluation    Currently in Pain? No/denies    Pain Onset More than a month ago    Multiple Pain Sites No  TREATMENT: Instructed patient in LE strengthening;  Standing with 3# ankle weight: -Hip flexion march x20 reps each LE; -hip abduction x20 reps each LE;  -hip extension x15 reps each LE: -mini squat x15 reps with cues for proper positioning;  -BLE heel raises x15 reps Required min VCS for erect posture and to increase AROM for better strengthening;  Instructed patient to reduce rail assist to challenge LE strength/motor control and increase weight bearing in LE;    Assessed vitals after exercise: BP: 165/48, HR 77      Neuro Re-ed: CGA and cues for sequencing and safety awareness   Gait in hallway: Forward/backward walking with head turns side/side calling out letters on wall x  80 feet x1 laps Forward walking with ball toss/catch x80 feet x1 laps Backward walking with ball toss/catch x40 feet x2 laps Pt required close supervision and cues to avoid veering side/side and increase gaze stabilization for better dynamic balance; Side stepping x8 feet with UE tap outside base of support tapping post it side/side x8 reps with close supervision; Patient required cues to increase speed to challenge dynamic balance;   Patient reports increased shortness of breath and fatigue with prolonged dynamic balance in hallway; BP: 184/52, HR 84 After short rest break: 127/63     Standing on airex: -forward/backward step firm to airex,  x10 reps  -Forward lunge to airex x10 reps each LE unsupported with min A for safety;   Forward step with BUE overhead lift looking up towards ceiling x10 reps each LE ; Patient does exhibit mild ankle instability with static standing;    Patient tolerated well. Overall she reports less knee discomfort. She does exhibit increased difficulty/instability while on airex pad. She continues to get short of breath with advanced exercise requiring short rest breaks. Vitals monitored. While BP was elevated it was not as high as during 6 min walk testing. Patient would benefit from additional skilled PT Intervention to improve strength, balance and cardiovascular conditioning;                           PT Education - 04/30/21 1150     Education Details exercise technique/positioning;    Person(s) Educated Patient    Methods Explanation;Verbal cues    Comprehension Verbalized understanding;Returned demonstration;Verbal cues required;Need further instruction              PT Short Term Goals - 04/11/21 1154       PT SHORT TERM GOAL #1   Title Pt will be independent with HEP in order to improve strength and balance in order to decrease fall risk and improve function at home and work.    Baseline 09/27/2020- Patient has no formal HEP  in place, 5/19: independent; 7/12: not performing, handout is lost 8/17: depends on the day due to medication changes and pain 9/6: intermittent compliance, 9/27: intermittent compliant due MS flare up and knee pain; 11/2: hasn't been doing formal HEP, but has been active; has been traveling a lot;    Time 6    Period Weeks    Status Partially Met    Target Date 05/09/21               PT Long Term Goals - 04/11/21 1154       PT LONG TERM GOAL #1   Title Pt will improve FOTO score from 61  to target score of 64 to display perceived improvements in ability to complete  ADL's and functional mobility activities.    Baseline 09/27/2020: FOTO=61, 10/26/20: 64%; 7/12: 58 8/17: 55% 9/6 63%, 9/27: 56%, 11/2: 62%    Time 8    Period Weeks    Status Partially Met    Target Date 06/06/21      PT LONG TERM GOAL #2   Title Pt will improve DGI by at least 3 points in order to demonstrate clinically significant improvement in balance and decreased risk for falls.    Baseline 09/27/2020-DGI=18/24, 5/19: 18/24 7/12: not tested; 7/15: 22/24    Time 8    Period Weeks    Status Achieved    Target Date 06/06/21      PT LONG TERM GOAL #3   Title Pt will improve ABC by at least 10 % in order to demonstrate clinically significant improvement in balance confidence.    Baseline 09/27/2020-68.1%, 5/19: 78% 7/12: not assessed; 7/15: 61.25    Time 8    Period Weeks    Status Achieved    Target Date 06/06/21      PT LONG TERM GOAL #4   Title Pt will decrease 5TSTS by at least 5 seconds (initial =23 sec with no UE support) in order to demonstrate clinically significant improvement in LE strength.    Baseline 09/27/2020= 23 sec. with No UE support, 5/19: 15 sec; 7/12: not assessed; 7/15: 17.68 sec 8/17: 13.8 seconds, 9/27: deferred due to knee pain;, 11/2: 14.5 sec without HHA    Time 8    Period Weeks    Status Achieved    Target Date 06/06/21      PT LONG TERM GOAL #5   Title Pt will decrease TUG to  below 14 seconds/decrease in order to demonstrate decreased fall risk.    Baseline 09/27/2020- TUG score= 15.15 sec without UE support, 5/19: 8.89 sec; 12/19/20: not assessed; 7/15: 9.8 sec 9/6: 9.66 seconds, 11/2: 8.89 sec    Time 8    Period Weeks    Status Achieved    Target Date 06/06/21      PT LONG TERM GOAL #6   Title Patient will increase six minute walk test distance to >1000 for progression to community ambulator and improve gait ability with BP in functional level and moderate fatigue or less;    Baseline 4/26: 695 ft with one seated rest break, 5/19: 975 feet; 7/12: not safe to attempt, BP too high 8/17: 610 ft with multiple rest breaks, 9/27: 990 feet, high BP 11/2: 1120 with high bp    Time 8    Period Weeks    Status Revised    Target Date 06/06/21      PT LONG TERM GOAL #7   Title Patient will increase Functional Gait Assessment score to >22/30 as to reduce fall risk and improve dynamic gait safety with community ambulation.    Baseline 9/6: 16/30, 9/27: Deferred due to time; 11/2: 18/30    Time 8    Period Weeks    Status Partially Met    Target Date 06/06/21      PT LONG TERM GOAL #8   Title Patient will tolerate 15 seconds of single leg stance without loss of balance to improve ability to get in and out of shower safely.    Baseline 9/6: L 9 seconds R 7 seconds, 9/27: R: 4-5 sec, limited by knee pain, L: 18 sec, 11/2: deferred;    Time 8    Period Weeks    Status Partially Met  Target Date 06/06/21                   Plan - 04/30/21 1154     Clinical Impression Statement Patient motivated and participated well within session. Progressed LE strengthening with increased repetition. She does continue to get short of breath with prolonged standing requiring short seated rest breaks. Vitals monitored this session. She required min VCs for correct exercise technique for optimal LE strengthening and motor control; Patient continues to have elevated BP with  advanced exercise requiring short rest break. She would benefit from additional skilled PT Intervention to improve strength, balance and gait safety;    Personal Factors and Comorbidities Comorbidity 3+    Comorbidities HTN, MS, Arthritis, COPD    Examination-Activity Limitations Bend;Carry;Continence;Lift;Squat;Stairs;Stand    Examination-Participation Restrictions Community Activity;Yard Work    Stability/Clinical Decision Making Stable/Uncomplicated    Rehab Potential Poor    PT Frequency 2x / week    PT Duration 8 weeks    PT Treatment/Interventions ADLs/Self Care Home Management;Cryotherapy;Moist Heat;DME Instruction;Gait training;Stair training;Functional mobility training;Therapeutic activities;Therapeutic exercise;Balance training;Neuromuscular re-education;Patient/family education;Manual techniques;Passive range of motion    PT Next Visit Plan Limit AMB activity if BP conitnues to go to unsafe HTN (>446KMMN systolic); review HEP for home including balance prn    PT Home Exercise Plan pt unaware of location of her original handout, has not been compliant, warrants review (12/19/20)    Consulted and Agree with Plan of Care Patient             Patient will benefit from skilled therapeutic intervention in order to improve the following deficits and impairments:  Abnormal gait, Cardiopulmonary status limiting activity, Decreased activity tolerance, Decreased balance, Decreased coordination, Decreased endurance, Decreased mobility, Decreased range of motion, Decreased strength, Difficulty walking, Impaired flexibility, Obesity, Pain  Visit Diagnosis: Unsteadiness on feet  Muscle weakness (generalized)  Other abnormalities of gait and mobility  Difficulty in walking, not elsewhere classified  Other lack of coordination     Problem List Patient Active Problem List   Diagnosis Date Noted   Atherosclerosis of abdominal aorta ([place]) 01/10/2021   Coronary artery disease involving  native coronary artery of native heart 01/10/2021   OSA (obstructive sleep apnea) 04/19/2020   Statin intolerance 01/14/2020   S/P ORIF (open reduction internal fixation) fracture 02/20/2018   DJD (degenerative joint disease) of knee 01/02/2017   Advanced care planning/counseling discussion 01/02/2017   Thrombocytopenia ([place]) 10/30/2015   Multiple sclerosis ([place])    Essential hypertension    Hyperlipidemia    Benign neoplasm of brain ([place]) 04/30/2013   Cerebral meningioma ([place]) 04/29/2013   Malignant hypertension 04/29/2013   Obesity 04/15/2013    Charlynn Salih, PT, DPT 04/30/2021, 1:02 PM  [place] MAIN [facility] SERVICES [address] [place], [place], [address] Phone: [phone]   Fax:  [phone]  Name: Leslie Smith MRN: [id number] Date of Birth: 1951-04-25

## 2021-05-02 ENCOUNTER — Ambulatory Visit: Payer: Medicare Other | Admitting: Physical Therapy

## 2021-05-02 ENCOUNTER — Encounter: Payer: Self-pay | Admitting: Physical Therapy

## 2021-05-02 DIAGNOSIS — R278 Other lack of coordination: Secondary | ICD-10-CM

## 2021-05-02 DIAGNOSIS — R262 Difficulty in walking, not elsewhere classified: Secondary | ICD-10-CM

## 2021-05-02 DIAGNOSIS — R2681 Unsteadiness on feet: Secondary | ICD-10-CM

## 2021-05-02 DIAGNOSIS — R2689 Other abnormalities of gait and mobility: Secondary | ICD-10-CM

## 2021-05-02 DIAGNOSIS — M6281 Muscle weakness (generalized): Secondary | ICD-10-CM

## 2021-05-02 NOTE — Therapy (Signed)
[place] MAIN [facility] SERVICES [address] [place], [place], [address] Phone: [phone]   Fax:  [phone]  Physical Therapy Treatment  Patient Details  Name: Leslie Smith MRN: [id number] Date of Birth: [birth year] Referring Provider (PT): Dr. Darlin Priestly   Encounter Date: 05/02/2021   PT End of Session - 05/02/21 1108     Visit Number 38    Number of Visits 6    Date for PT Re-Evaluation 06/07/21    Authorization Type Medicare A&B; Medicaid 2nd; VL review after 25 visits    Authorization Time Period 5/62/56-08/16/91; previous cert 7/34/2876-01/18/5725    Authorization - Visit Number 20    Authorization - Number of Visits 25    PT Start Time 1104    PT Stop Time 1145    PT Time Calculation (min) 41 min    Equipment Utilized During Treatment Gait belt    Activity Tolerance Patient tolerated treatment well;Patient limited by fatigue    Behavior During Therapy WFL for tasks assessed/performed             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[place]) 09/15/2014   Bladder incontinence    Cancer ([place]) 2014   brain tumor, resected   Cellulitis and abscess of leg 2019   2 episodes this year   Complication of anesthesia    DJD (degenerative joint disease) of knee 2018   Fall    Fatigue    History of blood transfusion    Hypertension    MS (multiple sclerosis) ([place])    Myelitis ([place])    Neuromuscular disorder ([place]) 2001   multiple sclerosis diagnosed by MRI   PONV (postoperative nausea and vomiting)    severe   Thrombocytopenia ([place])     Past Surgical History:  Procedure Laterality Date   ABDOMINAL HYSTERECTOMY  2011   required wound vac for 6 weeks. ovaries had grown attached to her back bone   BRAIN SURGERY  2015   tumor resection-denies seizures   BREAST SURGERY Left 2014   biopsy. negative   HARDWARE REMOVAL Left 05/05/2018   Procedure: HARDWARE REMOVAL- LEFT ANKLE;  Surgeon: Hessie Knows, MD;   Location: [facility];  Service: Orthopedics;  Laterality: Left;   I & D EXTREMITY Left 08/25/2015   Procedure: IRRIGATION AND DEBRIDEMENT THUMB;  Surgeon: Iran Planas, MD;  Location: [facility];  Service: Orthopedics;  Laterality: Left;   ORIF ANKLE FRACTURE Left 02/20/2018   Procedure: OPEN REDUCTION INTERNAL FIXATION (ORIF) ANKLE FRACTURE;  Surgeon: Hessie Knows, MD;  Location: [facility];  Service: Orthopedics;  Laterality: Left;    There were no vitals filed for this visit.   Subjective Assessment - 05/02/21 1106     Subjective Patient reports increased fatigue. She spent yesterday cleaning her cabinets and feels like she over did it. She did get the gas fixed and now has heat. She also got her driveway fixed. Was having some abdominal cramp, but that's better;    Pertinent History Patient diagnosed with MS in 2001. Past medical history significant for HTN, MS, DJD right knee, Hyperlipidemia, Thrombocytopenia, S/p ORIF Left ankle fracture.    How long can you sit comfortably? No restrictions    How long can you stand comfortably? No baseline ADL/IADL activity limited by standing tolerance    How long can you walk comfortably? today is limited by her walk to mailbox; maybe 15 min at evaluation    Currently in Pain? No/denies  Pain Onset More than a month ago    Multiple Pain Sites No                       TREATMENT:  Instructed patient in LE strengthening;  Forward step ups with 0 rail assist x10 reps leading with LLE for better tolerance to reduce right knee pain, required CGA  Standing feet apart: Heel raises x15 unsupported  BP: 176/80, HR 81 after doing stairs/heel raises Provided patient with short rest break;   Standing on firm surface: Alternate toe taps unsupported to 6 inch step x20 reps with close supervision for safety;    Assessed vitals after exercise: BP: 175/57, HR 78 After 2-3 seated rest break: 149/65, HR75      Neuro Re-ed: CGA and cues for  sequencing and safety awareness   Gait in hallway: Forward walking with ball pass side/side x 80 feet x1 lap Forward walking with ball up/down following ball with eyes x80 feet x2 laps Side stepping x8 feet with UE tap outside base of support tapping post it side/side x10 reps with close supervision; Patient required cues to increase speed to challenge dynamic balance;  Patient reports mild shortness of  breath after walking; Minimal fatigue; She exhibits minimal unsteadiness requiring supervision during gait in hallway with good balance/control;   Side step diagonal forward/backward to cones tapping cone with small ball (reaching down to tap) #4 cones x2 sets each, CGA for safety;    Forward step with BUE overhead lift looking up towards ceiling x5 reps LLE  Progressed to forward step with BUE holding ball and trunk rotation side/side x5 reps ; Patient does exhibit mild ankle instability with static standing; Required CGA for safety;    Patient tolerated well. Overall she reports less knee discomfort. She does exhibit increased difficulty/instability while on airex pad. She continues to get short of breath with advanced exercise requiring short rest breaks. Vitals monitored. While BP was elevated it was not as high as during 6 min walk testing. Patient would benefit from additional skilled PT Intervention to improve strength, balance and cardiovascular conditioning;                          PT Education - 05/02/21 1107     Education Details exercise technique/positioning;    Person(s) Educated Patient    Methods Explanation;Verbal cues    Comprehension Verbalized understanding;Returned demonstration;Verbal cues required;Need further instruction              PT Short Term Goals - 04/11/21 1154       PT SHORT TERM GOAL #1   Title Pt will be independent with HEP in order to improve strength and balance in order to decrease fall risk and improve function at home and  work.    Baseline 09/27/2020- Patient has no formal HEP in place, 5/19: independent; 7/12: not performing, handout is lost 8/17: depends on the day due to medication changes and pain 9/6: intermittent compliance, 9/27: intermittent compliant due MS flare up and knee pain; 11/2: hasn't been doing formal HEP, but has been active; has been traveling a lot;    Time 6    Period Weeks    Status Partially Met    Target Date 05/09/21               PT Long Term Goals - 04/11/21 1154       PT LONG TERM GOAL #1  Title Pt will improve FOTO score from 61  to target score of 64 to display perceived improvements in ability to complete ADL's and functional mobility activities.    Baseline 09/27/2020: FOTO=61, 10/26/20: 64%; 7/12: 58 8/17: 55% 9/6 63%, 9/27: 56%, 11/2: 62%    Time 8    Period Weeks    Status Partially Met    Target Date 06/06/21      PT LONG TERM GOAL #2   Title Pt will improve DGI by at least 3 points in order to demonstrate clinically significant improvement in balance and decreased risk for falls.    Baseline 09/27/2020-DGI=18/24, 5/19: 18/24 7/12: not tested; 7/15: 22/24    Time 8    Period Weeks    Status Achieved    Target Date 06/06/21      PT LONG TERM GOAL #3   Title Pt will improve ABC by at least 10 % in order to demonstrate clinically significant improvement in balance confidence.    Baseline 09/27/2020-68.1%, 5/19: 78% 7/12: not assessed; 7/15: 61.25    Time 8    Period Weeks    Status Achieved    Target Date 06/06/21      PT LONG TERM GOAL #4   Title Pt will decrease 5TSTS by at least 5 seconds (initial =23 sec with no UE support) in order to demonstrate clinically significant improvement in LE strength.    Baseline 09/27/2020= 23 sec. with No UE support, 5/19: 15 sec; 7/12: not assessed; 7/15: 17.68 sec 8/17: 13.8 seconds, 9/27: deferred due to knee pain;, 11/2: 14.5 sec without HHA    Time 8    Period Weeks    Status Achieved    Target Date 06/06/21      PT  LONG TERM GOAL #5   Title Pt will decrease TUG to below 14 seconds/decrease in order to demonstrate decreased fall risk.    Baseline 09/27/2020- TUG score= 15.15 sec without UE support, 5/19: 8.89 sec; 12/19/20: not assessed; 7/15: 9.8 sec 9/6: 9.66 seconds, 11/2: 8.89 sec    Time 8    Period Weeks    Status Achieved    Target Date 06/06/21      PT LONG TERM GOAL #6   Title Patient will increase six minute walk test distance to >1000 for progression to community ambulator and improve gait ability with BP in functional level and moderate fatigue or less;    Baseline 4/26: 695 ft with one seated rest break, 5/19: 975 feet; 7/12: not safe to attempt, BP too high 8/17: 610 ft with multiple rest breaks, 9/27: 990 feet, high BP 11/2: 1120 with high bp    Time 8    Period Weeks    Status Revised    Target Date 06/06/21      PT LONG TERM GOAL #7   Title Patient will increase Functional Gait Assessment score to >22/30 as to reduce fall risk and improve dynamic gait safety with community ambulation.    Baseline 9/6: 16/30, 9/27: Deferred due to time; 11/2: 18/30    Time 8    Period Weeks    Status Partially Met    Target Date 06/06/21      PT LONG TERM GOAL #8   Title Patient will tolerate 15 seconds of single leg stance without loss of balance to improve ability to get in and out of shower safely.    Baseline 9/6: L 9 seconds R 7 seconds, 9/27: R: 4-5 sec, limited by knee  pain, L: 18 sec, 11/2: deferred;    Time 8    Period Weeks    Status Partially Met    Target Date 06/06/21                   Plan - 05/02/21 1140     Clinical Impression Statement Patient motivated and participated well within session. She was instructed in advanced LE strengthening and balance exercise. PT instructed patient in exercise to challenge cardiovascular conditioning. Vitals monitored throughout session. Patient instructed in advancd balance exercise. She exhibits improvement in gait with less lateral  instability with ball pass side/side, up/down. She was able to walk unsupported with supervision with less instability. She does continue to fatigue with prolonged standing/walking. She would benefit from additional skilled PT Intervention to improve strength, balance and mobility;    Personal Factors and Comorbidities Comorbidity 3+    Comorbidities HTN, MS, Arthritis, COPD    Examination-Activity Limitations Bend;Carry;Continence;Lift;Squat;Stairs;Stand    Examination-Participation Restrictions Community Activity;Yard Work    Stability/Clinical Decision Making Stable/Uncomplicated    Rehab Potential Poor    PT Frequency 2x / week    PT Duration 8 weeks    PT Treatment/Interventions ADLs/Self Care Home Management;Cryotherapy;Moist Heat;DME Instruction;Gait training;Stair training;Functional mobility training;Therapeutic activities;Therapeutic exercise;Balance training;Neuromuscular re-education;Patient/family education;Manual techniques;Passive range of motion    PT Next Visit Plan Limit AMB activity if BP conitnues to go to unsafe HTN (>256LSLH systolic); review HEP for home including balance prn    PT Home Exercise Plan pt unaware of location of her original handout, has not been compliant, warrants review (12/19/20)    Consulted and Agree with Plan of Care Patient             Patient will benefit from skilled therapeutic intervention in order to improve the following deficits and impairments:  Abnormal gait, Cardiopulmonary status limiting activity, Decreased activity tolerance, Decreased balance, Decreased coordination, Decreased endurance, Decreased mobility, Decreased range of motion, Decreased strength, Difficulty walking, Impaired flexibility, Obesity, Pain  Visit Diagnosis: Unsteadiness on feet  Muscle weakness (generalized)  Other abnormalities of gait and mobility  Difficulty in walking, not elsewhere classified  Other lack of coordination     Problem List Patient  Active Problem List   Diagnosis Date Noted   Atherosclerosis of abdominal aorta ([place]) 01/10/2021   Coronary artery disease involving native coronary artery of native heart 01/10/2021   OSA (obstructive sleep apnea) 04/19/2020   Statin intolerance 01/14/2020   S/P ORIF (open reduction internal fixation) fracture 02/20/2018   DJD (degenerative joint disease) of knee 01/02/2017   Advanced care planning/counseling discussion 01/02/2017   Thrombocytopenia ([place]) 10/30/2015   Multiple sclerosis ([place])    Essential hypertension    Hyperlipidemia    Benign neoplasm of brain ([place]) 04/30/2013   Cerebral meningioma ([place]) 04/29/2013   Malignant hypertension 04/29/2013   Obesity 04/15/2013    Basha Krygier, PT, DPT 05/02/2021, 11:48 AM  [place] [address] [place], [place], [address] Phone: [phone]   Fax:  [phone]  Name: RENI HAUSNER MRN: [id number] Date of Birth: [birth year]

## 2021-05-07 ENCOUNTER — Other Ambulatory Visit: Payer: Self-pay

## 2021-05-07 ENCOUNTER — Ambulatory Visit: Payer: Medicare Other | Admitting: Physical Therapy

## 2021-05-07 ENCOUNTER — Encounter: Payer: Self-pay | Admitting: Physical Therapy

## 2021-05-07 DIAGNOSIS — M6281 Muscle weakness (generalized): Secondary | ICD-10-CM

## 2021-05-07 DIAGNOSIS — R262 Difficulty in walking, not elsewhere classified: Secondary | ICD-10-CM

## 2021-05-07 DIAGNOSIS — R2689 Other abnormalities of gait and mobility: Secondary | ICD-10-CM | POA: Diagnosis not present

## 2021-05-07 DIAGNOSIS — R269 Unspecified abnormalities of gait and mobility: Secondary | ICD-10-CM

## 2021-05-07 DIAGNOSIS — R2681 Unsteadiness on feet: Secondary | ICD-10-CM

## 2021-05-07 DIAGNOSIS — R278 Other lack of coordination: Secondary | ICD-10-CM

## 2021-05-07 NOTE — Therapy (Addendum)
[place] MAIN [facility] SERVICES [address] [place], [place], [address] Phone: [phone]   Fax:  [phone]  Physical Therapy Treatment  Patient Details  Name: Leslie Smith MRN: [id number] Date of Birth: 1950-07-30 Referring Provider (PT): Dr. Darlin Priestly   Encounter Date: 05/07/2021   PT End of Session - 05/07/21 1402     Visit Number 39    Number of Visits 51    Date for PT Re-Evaluation 06/07/21    Authorization Type Medicare A&B; Medicaid 2nd; VL review after 25 visits    Authorization Time Period 5/36/46-8/0/32; previous cert 07/02/4823-0/08/7046    Authorization - Visit Number 20    Authorization - Number of Visits 25    PT Start Time 8891    PT Stop Time 1227    PT Time Calculation (min) 39 min    Equipment Utilized During Treatment Gait belt    Activity Tolerance Patient tolerated treatment well;Patient limited by fatigue    Behavior During Therapy WFL for tasks assessed/performed             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[place]) 09/15/2014   Bladder incontinence    Cancer ([place]) 2014   brain tumor, resected   Cellulitis and abscess of leg 2019   2 episodes this year   Complication of anesthesia    DJD (degenerative joint disease) of knee 2018   Fall    Fatigue    History of blood transfusion    Hypertension    MS (multiple sclerosis) ([place])    Myelitis ([place])    Neuromuscular disorder ([place]) 2001   multiple sclerosis diagnosed by MRI   PONV (postoperative nausea and vomiting)    severe   Thrombocytopenia ([place])     Past Surgical History:  Procedure Laterality Date   ABDOMINAL HYSTERECTOMY  2011   required wound vac for 6 weeks. ovaries had grown attached to her back bone   BRAIN SURGERY  2015   tumor resection-denies seizures   BREAST SURGERY Left 2014   biopsy. negative   HARDWARE REMOVAL Left 05/05/2018   Procedure: HARDWARE REMOVAL- LEFT ANKLE;  Surgeon: Hessie Knows, MD;   Location: [facility];  Service: Orthopedics;  Laterality: Left;   I & D EXTREMITY Left 08/25/2015   Procedure: IRRIGATION AND DEBRIDEMENT THUMB;  Surgeon: Iran Planas, MD;  Location: [facility];  Service: Orthopedics;  Laterality: Left;   ORIF ANKLE FRACTURE Left 02/20/2018   Procedure: OPEN REDUCTION INTERNAL FIXATION (ORIF) ANKLE FRACTURE;  Surgeon: Hessie Knows, MD;  Location: [facility];  Service: Orthopedics;  Laterality: Left;    There were no vitals filed for this visit.  Vitals at beginning of session  Subjective Assessment - 05/07/21 1152     Subjective Pt states she is okay. States she is a "little out of whack" today due to sinuses. Reports her BP was elevated yesterday. "Normal" pain in back, R knee and L ankle.    Pertinent History Patient diagnosed with MS in 2001. Past medical history significant for HTN, MS, DJD right knee, Hyperlipidemia, Thrombocytopenia, S/p ORIF Left ankle fracture.    Limitations Lifting;Standing;Walking;House hold activities    How long can you sit comfortably? No restrictions    How long can you stand comfortably? No baseline ADL/IADL activity limited by standing tolerance    How long can you walk comfortably? today is limited by her walk to mailbox; maybe 15 min at evaluation    Patient  Stated Goals Walk better and regain some lost strength.    Currently in Pain? Yes    Pain Score 5     Pain Location Knee    Pain Orientation Right    Pain Descriptors / Indicators Aching    Pain Onset More than a month ago              BP (pre-treatment): 175/64, HR 89   TREATMENT:  Forward step ups 6" step, no UE support 2 x 10 reps leading with LLE for better tolerance and to reduce right knee pain.  Alternating toe taps to 6" surface, no UE support 2 x 15 reps each side. SOB upon completion of each set.    Standing heel raises 2 x 15 unsupported  Lateral stepping over 1/2 foam roll 2 x 10 each side; VC for improved foot clearance with fatigue, CGA to  steady.   Assessed vitals after exercise: BP: 172/95, HR 113 After 3 minute seated rest break: BP: 168/75, HR 102      Neuro Re-ed: CGA and cues for sequencing and safety awareness   Gait in hallway: Forward walking with ball pass side/side x 80 feet x2  Forward walking with ball up/down following ball with eyes x80 feet x2 laps  Patient reports mild shortness of  breath after walking; Minimal fatigue; She exhibits minimal unsteadiness requiring supervision during gait in hallway with good balance/control;     Clinical Impression: Pt demonstrates excellent motivation throughout today's session. After most exercises, pt reports minimal fatigue however she frequently exhibited SOB; PT provided seated rest breaks throughout. Vitals monitored - DBP elevated after exercise and decreased within 3 minutes when re-tested. No notable LOB during session. Patient would benefit from additional skilled PT Intervention to improve strength, balance and cardiovascular conditioning.              PT Short Term Goals - 04/11/21 1154       PT SHORT TERM GOAL #1   Title Pt will be independent with HEP in order to improve strength and balance in order to decrease fall risk and improve function at home and work.    Baseline 09/27/2020- Patient has no formal HEP in place, 5/19: independent; 7/12: not performing, handout is lost 8/17: depends on the day due to medication changes and pain 9/6: intermittent compliance, 9/27: intermittent compliant due MS flare up and knee pain; 11/2: hasn't been doing formal HEP, but has been active; has been traveling a lot;    Time 6    Period Weeks    Status Partially Met    Target Date 05/09/21               PT Long Term Goals - 04/11/21 1154       PT LONG TERM GOAL #1   Title Pt will improve FOTO score from 61  to target score of 64 to display perceived improvements in ability to complete ADL's and functional mobility activities.    Baseline 09/27/2020:  FOTO=61, 10/26/20: 64%; 7/12: 58 8/17: 55% 9/6 63%, 9/27: 56%, 11/2: 62%    Time 8    Period Weeks    Status Partially Met    Target Date 06/06/21      PT LONG TERM GOAL #2   Title Pt will improve DGI by at least 3 points in order to demonstrate clinically significant improvement in balance and decreased risk for falls.    Baseline 09/27/2020-DGI=18/24, 5/19: 18/24 7/12: not tested; 7/15: 22/24  Time 8    Period Weeks    Status Achieved    Target Date 06/06/21      PT LONG TERM GOAL #3   Title Pt will improve ABC by at least 10 % in order to demonstrate clinically significant improvement in balance confidence.    Baseline 09/27/2020-68.1%, 5/19: 78% 7/12: not assessed; 7/15: 61.25    Time 8    Period Weeks    Status Achieved    Target Date 06/06/21      PT LONG TERM GOAL #4   Title Pt will decrease 5TSTS by at least 5 seconds (initial =23 sec with no UE support) in order to demonstrate clinically significant improvement in LE strength.    Baseline 09/27/2020= 23 sec. with No UE support, 5/19: 15 sec; 7/12: not assessed; 7/15: 17.68 sec 8/17: 13.8 seconds, 9/27: deferred due to knee pain;, 11/2: 14.5 sec without HHA    Time 8    Period Weeks    Status Achieved    Target Date 06/06/21      PT LONG TERM GOAL #5   Title Pt will decrease TUG to below 14 seconds/decrease in order to demonstrate decreased fall risk.    Baseline 09/27/2020- TUG score= 15.15 sec without UE support, 5/19: 8.89 sec; 12/19/20: not assessed; 7/15: 9.8 sec 9/6: 9.66 seconds, 11/2: 8.89 sec    Time 8    Period Weeks    Status Achieved    Target Date 06/06/21      PT LONG TERM GOAL #6   Title Patient will increase six minute walk test distance to >1000 for progression to community ambulator and improve gait ability with BP in functional level and moderate fatigue or less;    Baseline 4/26: 695 ft with one seated rest break, 5/19: 975 feet; 7/12: not safe to attempt, BP too high 8/17: 610 ft with multiple rest  breaks, 9/27: 990 feet, high BP 11/2: 1120 with high bp    Time 8    Period Weeks    Status Revised    Target Date 06/06/21      PT LONG TERM GOAL #7   Title Patient will increase Functional Gait Assessment score to >22/30 as to reduce fall risk and improve dynamic gait safety with community ambulation.    Baseline 9/6: 16/30, 9/27: Deferred due to time; 11/2: 18/30    Time 8    Period Weeks    Status Partially Met    Target Date 06/06/21      PT LONG TERM GOAL #8   Title Patient will tolerate 15 seconds of single leg stance without loss of balance to improve ability to get in and out of shower safely.    Baseline 9/6: L 9 seconds R 7 seconds, 9/27: R: 4-5 sec, limited by knee pain, L: 18 sec, 11/2: deferred;    Time 8    Period Weeks    Status Partially Met    Target Date 06/06/21                   Plan - 05/07/21 1414     Clinical Impression Statement Pt demonstrates excellent motivation throughout today's session. After most exercises, pt reports minimal fatigue however she frequently exhibited SOB; PT provided seated rest breaks throughout. Vitals monitored - DBP elevated after exercise and decreased within 3 minutes when re-tested. No notable LOB during session. Patient would benefit from additional skilled PT Intervention to improve strength, balance and cardiovascular conditioning.  Personal Factors and Comorbidities Comorbidity 3+    Comorbidities HTN, MS, Arthritis, COPD    Examination-Activity Limitations Bend;Carry;Continence;Lift;Squat;Stairs;Stand    Examination-Participation Restrictions Community Activity;Yard Work    Stability/Clinical Decision Making Stable/Uncomplicated    Rehab Potential Poor    PT Frequency 2x / week    PT Duration 8 weeks    PT Treatment/Interventions ADLs/Self Care Home Management;Cryotherapy;Moist Heat;DME Instruction;Gait training;Stair training;Functional mobility training;Therapeutic activities;Therapeutic exercise;Balance  training;Neuromuscular re-education;Patient/family education;Manual techniques;Passive range of motion    PT Next Visit Plan Limit AMB activity if BP conitnues to go to unsafe HTN (>[id number] systolic); review HEP for home including balance prn    PT Home Exercise Plan pt unaware of location of her original handout, has not been compliant, warrants review (12/19/20)    Consulted and Agree with Plan of Care Patient             Patient will benefit from skilled therapeutic intervention in order to improve the following deficits and impairments:  Abnormal gait, Cardiopulmonary status limiting activity, Decreased activity tolerance, Decreased balance, Decreased coordination, Decreased endurance, Decreased mobility, Decreased range of motion, Decreased strength, Difficulty walking, Impaired flexibility, Obesity, Pain  Visit Diagnosis: Abnormality of gait and mobility  Other lack of coordination  Difficulty in walking, not elsewhere classified  Unsteadiness on feet  Muscle weakness (generalized)  Other abnormalities of gait and mobility     Problem List Patient Active Problem List   Diagnosis Date Noted   Atherosclerosis of abdominal aorta ([place]) 01/10/2021   Coronary artery disease involving native coronary artery of native heart 01/10/2021   OSA (obstructive sleep apnea) 04/19/2020   Statin intolerance 01/14/2020   S/P ORIF (open reduction internal fixation) fracture 02/20/2018   DJD (degenerative joint disease) of knee 01/02/2017   Advanced care planning/counseling discussion 01/02/2017   Thrombocytopenia ([place]) 10/30/2015   Multiple sclerosis ([place])    Essential hypertension    Hyperlipidemia    Benign neoplasm of brain ([place]) 04/30/2013   Cerebral meningioma ([place]) 04/29/2013   Malignant hypertension 04/29/2013   Obesity 04/15/2013    Patrina Levering PT, DPT  [facility] MAIN [facility] SERVICES [address] [place], [place], [address] Phone:  [phone]   Fax:  [phone]  Name: Leslie Smith MRN: [id number] Date of Birth: 14-Mar-1951

## 2021-05-09 ENCOUNTER — Ambulatory Visit: Payer: Medicare Other | Admitting: Physical Therapy

## 2021-05-14 ENCOUNTER — Ambulatory Visit: Payer: Medicare Other | Admitting: Physical Therapy

## 2021-05-15 ENCOUNTER — Ambulatory Visit: Payer: Medicare Other | Attending: Neurology | Admitting: Physical Therapy

## 2021-05-15 ENCOUNTER — Encounter: Payer: Self-pay | Admitting: Physical Therapy

## 2021-05-15 ENCOUNTER — Other Ambulatory Visit: Payer: Self-pay

## 2021-05-15 VITALS — BP 144/53 | HR 67

## 2021-05-15 DIAGNOSIS — R2681 Unsteadiness on feet: Secondary | ICD-10-CM

## 2021-05-15 DIAGNOSIS — R278 Other lack of coordination: Secondary | ICD-10-CM | POA: Diagnosis present

## 2021-05-15 DIAGNOSIS — R2689 Other abnormalities of gait and mobility: Secondary | ICD-10-CM

## 2021-05-15 DIAGNOSIS — R262 Difficulty in walking, not elsewhere classified: Secondary | ICD-10-CM

## 2021-05-15 DIAGNOSIS — M6281 Muscle weakness (generalized): Secondary | ICD-10-CM | POA: Diagnosis present

## 2021-05-15 NOTE — Therapy (Signed)
[place] MAIN [facility] SERVICES [address] [place], [place], [address] Phone: [phone]   Fax:  [phone]  Physical Therapy Treatment Physical Therapy Progress Note   Dates of reporting period  03/06/21   to   05/15/21   Patient Details  Name: Leslie Smith MRN: [id number] Date of Birth: 08-06-1950 Referring Provider (PT): Dr. Darlin Priestly   Encounter Date: 05/15/2021   PT End of Session - 05/15/21 1010     Visit Number 40    Number of Visits 69    Date for PT Re-Evaluation 06/07/21    Authorization Type Medicare A&B; Medicaid 2nd; VL review after 25 visits    Authorization Time Period 2/87/68-06/11/55; previous cert 2/62/0355-9/74/1638    Authorization - Visit Number 68    Authorization - Number of Visits 25    PT Start Time 1003    PT Stop Time 1048    PT Time Calculation (min) 45 min    Equipment Utilized During Treatment Gait belt    Activity Tolerance Patient tolerated treatment well;Patient limited by fatigue    Behavior During Therapy WFL for tasks assessed/performed             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[place]) 09/15/2014   Bladder incontinence    Cancer ([place]) 2014   brain tumor, resected   Cellulitis and abscess of leg 2019   2 episodes this year   Complication of anesthesia    DJD (degenerative joint disease) of knee 2018   Fall    Fatigue    History of blood transfusion    Hypertension    MS (multiple sclerosis) ([place])    Myelitis ([place])    Neuromuscular disorder ([place]) 2001   multiple sclerosis diagnosed by MRI   PONV (postoperative nausea and vomiting)    severe   Thrombocytopenia ([place])     Past Surgical History:  Procedure Laterality Date   ABDOMINAL HYSTERECTOMY  2011   required wound vac for 6 weeks. ovaries had grown attached to her back bone   BRAIN SURGERY  2015   tumor resection-denies seizures   BREAST SURGERY Left 2014   biopsy. negative   HARDWARE REMOVAL Left  05/05/2018   Procedure: HARDWARE REMOVAL- LEFT ANKLE;  Surgeon: Hessie Knows, MD;  Location: [facility];  Service: Orthopedics;  Laterality: Left;   I & D EXTREMITY Left 08/25/2015   Procedure: IRRIGATION AND DEBRIDEMENT THUMB;  Surgeon: Iran Planas, MD;  Location: [place];  Service: Orthopedics;  Laterality: Left;   ORIF ANKLE FRACTURE Left 02/20/2018   Procedure: OPEN REDUCTION INTERNAL FIXATION (ORIF) ANKLE FRACTURE;  Surgeon: Hessie Knows, MD;  Location: [facility];  Service: Orthopedics;  Laterality: Left;    Vitals:   05/15/21 1008  BP: (!) 144/53  Pulse: 67     Subjective Assessment - 05/15/21 1008     Subjective Patient reports general soreness/achiness due to cold wet weather.    Pertinent History Patient diagnosed with MS in 2001. Past medical history significant for HTN, MS, DJD right knee, Hyperlipidemia, Thrombocytopenia, S/p ORIF Left ankle fracture.    Limitations Lifting;Standing;Walking;House hold activities    How long can you sit comfortably? No restrictions    How long can you stand comfortably? No baseline ADL/IADL activity limited by standing tolerance    How long can you walk comfortably? today is limited by her walk to mailbox; maybe 15 min at evaluation    Patient Stated Goals Walk  better and regain some lost strength.    Currently in Pain? Yes    Pain Score 6     Pain Location --   achy all over   Pain Descriptors / Indicators Aching;Sore    Pain Type Chronic pain    Pain Onset More than a month ago    Pain Frequency Intermittent    Aggravating Factors  worse with standing/walking    Pain Relieving Factors rest/pain meds    Effect of Pain on Daily Activities decreased activity tolerance;    Multiple Pain Sites No                  TREATMENT:  Vitals assessed;     Neuro Re-ed: CGA and cues for sequencing and safety awareness  Forward step ups airex to 4 inch step with 0 rail assist x15 reps x2 sets leading with LLE for better tolerance to reduce  right knee pain, required CGA     Standing on 1/2 bolster: -heel/toe raises x15 reps with rail assist intermittent -feet apart: holding small ball:   Diagonal reach x5 reps each direction  Trunk rotation x5 reps each Required min A for safety; Patient does exhibit decreased ankle strategies while on unstable surface;  Gait in hallway: Forward walking head turns side/side x2 laps with calling out letters/words Backward walking x1 lap Forward walking high march with alternate UE lift (toy soldier) x1 lap Side stepping x8 feet with UE tap outside base of support tapping post it side/side x10 reps with close supervision; Patient required cues to increase speed to challenge dynamic balance;  Patient reports mild shortness of  breath after walking; Minimal fatigue; She exhibits minimal unsteadiness requiring supervision during gait in hallway with good balance/control;  BP: 157/81, HR 87  Standing on airex: Feet apart:  Picking up cones from 6 inch step and then placing back #3 x2 sets unsupported  Modified tandem stance: BUE ball pass side/side x5 reps each Required CGA for safety with mild unsteadiness noted, especially with narrow base of support;      Patient tolerated well. She exhibits less rise in BP with prolonged standing/gait tasks. Patient does exhibit increased ankle instability with decreased strategies, requiring CGA to min A while on compliant surface unsupported  Patient's condition has the potential to improve in response to therapy. Maximum improvement is yet to be obtained. The anticipated improvement is attainable and reasonable in a generally predictable time.  Patient reports mixed adherence with HEP due to holidays. Goals were recently updated on 11/2. She has missed a few sessions due to holidays and sickness.                             PT Education - 05/15/21 1010     Education Details exercise technique/positioning;    Person(s) Educated  Patient    Methods Explanation;Verbal cues    Comprehension Verbalized understanding;Returned demonstration;Verbal cues required;Need further instruction              PT Short Term Goals - 05/15/21 1224       PT SHORT TERM GOAL #1   Title Pt will be independent with HEP in order to improve strength and balance in order to decrease fall risk and improve function at home and work.    Baseline 09/27/2020- Patient has no formal HEP in place, 5/19: independent; 7/12: not performing, handout is lost 8/17: depends on the day due to medication changes  and pain 9/6: intermittent compliance, 9/27: intermittent compliant due MS flare up and knee pain; 11/2: hasn't been doing formal HEP, but has been active; has been traveling a lot; 12/6: mixed adherence due to holidays    Time 6    Period Weeks    Status Partially Met    Target Date 05/09/21               PT Long Term Goals - 05/15/21 1223       PT LONG TERM GOAL #1   Title Pt will improve FOTO score from 61  to target score of 64 to display perceived improvements in ability to complete ADL's and functional mobility activities.    Baseline 09/27/2020: FOTO=61, 10/26/20: 64%; 7/12: 58 8/17: 55% 9/6 63%, 9/27: 56%, 11/2: 62%    Time 8    Period Weeks    Status Partially Met    Target Date 06/06/21      PT LONG TERM GOAL #2   Title Pt will improve DGI by at least 3 points in order to demonstrate clinically significant improvement in balance and decreased risk for falls.    Baseline 09/27/2020-DGI=18/24, 5/19: 18/24 7/12: not tested; 7/15: 22/24    Time 8    Period Weeks    Status Achieved    Target Date 06/06/21      PT LONG TERM GOAL #3   Title Pt will improve ABC by at least 10 % in order to demonstrate clinically significant improvement in balance confidence.    Baseline 09/27/2020-68.1%, 5/19: 78% 7/12: not assessed; 7/15: 61.25    Time 8    Period Weeks    Status Achieved    Target Date 06/06/21      PT LONG TERM GOAL #4    Title Pt will decrease 5TSTS by at least 5 seconds (initial =23 sec with no UE support) in order to demonstrate clinically significant improvement in LE strength.    Baseline 09/27/2020= 23 sec. with No UE support, 5/19: 15 sec; 7/12: not assessed; 7/15: 17.68 sec 8/17: 13.8 seconds, 9/27: deferred due to knee pain;, 11/2: 14.5 sec without HHA    Time 8    Period Weeks    Status Achieved    Target Date 06/06/21      PT LONG TERM GOAL #5   Title Pt will decrease TUG to below 14 seconds/decrease in order to demonstrate decreased fall risk.    Baseline 09/27/2020- TUG score= 15.15 sec without UE support, 5/19: 8.89 sec; 12/19/20: not assessed; 7/15: 9.8 sec 9/6: 9.66 seconds, 11/2: 8.89 sec    Time 8    Period Weeks    Status Achieved    Target Date 06/06/21      PT LONG TERM GOAL #6   Title Patient will increase six minute walk test distance to >1000 for progression to community ambulator and improve gait ability with BP in functional level and moderate fatigue or less;    Baseline 4/26: 695 ft with one seated rest break, 5/19: 975 feet; 7/12: not safe to attempt, BP too high 8/17: 610 ft with multiple rest breaks, 9/27: 990 feet, high BP 11/2: 1120 with high bp, 12/6: not formally assessed due to achiness and fatigue    Time 8    Period Weeks    Status Revised    Target Date 06/06/21      PT LONG TERM GOAL #7   Title Patient will increase Functional Gait Assessment score to >22/30 as to  reduce fall risk and improve dynamic gait safety with community ambulation.    Baseline 9/6: 16/30, 9/27: Deferred due to time; 11/2: 18/30    Time 8    Period Weeks    Status Partially Met    Target Date 06/06/21      PT LONG TERM GOAL #8   Title Patient will tolerate 15 seconds of single leg stance without loss of balance to improve ability to get in and out of shower safely.    Baseline 9/6: L 9 seconds R 7 seconds, 9/27: R: 4-5 sec, limited by knee pain, L: 18 sec, 11/2: deferred;    Time 8     Period Weeks    Status Partially Met    Target Date 06/06/21                   Plan - 05/15/21 1217     Clinical Impression Statement Patient motivated and participated well within session. She does report general achiness from cold, wet weather. instructed patient in advanced balance exercise utilizing compliant surface to challenge stance control. Patient continues to have decreased ankle stability requiring CGA to min A when unsupported. Patient also instructed in advanced balance exercise with walking in hallway. She did have difficulty with UE/LE coordination with march and UE reach. Patient did become short of breath with prolonged walking in hallway, however BP was not as elevated indicating improved cardiovascular conditioning. Goals were recently updated. Patient would benefit from additional skilled PT Intervention to improve strength, balance and mobility;    Personal Factors and Comorbidities Comorbidity 3+    Comorbidities HTN, MS, Arthritis, COPD    Examination-Activity Limitations Bend;Carry;Continence;Lift;Squat;Stairs;Stand    Examination-Participation Restrictions Community Activity;Yard Work    Stability/Clinical Decision Making Stable/Uncomplicated    Rehab Potential Poor    PT Frequency 2x / week    PT Duration 8 weeks    PT Treatment/Interventions ADLs/Self Care Home Management;Cryotherapy;Moist Heat;DME Instruction;Gait training;Stair training;Functional mobility training;Therapeutic activities;Therapeutic exercise;Balance training;Neuromuscular re-education;Patient/family education;Manual techniques;Passive range of motion    PT Next Visit Plan Limit AMB activity if BP conitnues to go to unsafe HTN (>174YCXK systolic); review HEP for home including balance prn    PT Home Exercise Plan pt unaware of location of her original handout, has not been compliant, warrants review (12/19/20)    Consulted and Agree with Plan of Care Patient             Patient will  benefit from skilled therapeutic intervention in order to improve the following deficits and impairments:  Abnormal gait, Cardiopulmonary status limiting activity, Decreased activity tolerance, Decreased balance, Decreased coordination, Decreased endurance, Decreased mobility, Decreased range of motion, Decreased strength, Difficulty walking, Impaired flexibility, Obesity, Pain  Visit Diagnosis: Other abnormalities of gait and mobility  Other lack of coordination  Difficulty in walking, not elsewhere classified  Unsteadiness on feet  Muscle weakness (generalized)     Problem List Patient Active Problem List   Diagnosis Date Noted   Atherosclerosis of abdominal aorta ([place]) 01/10/2021   Coronary artery disease involving native coronary artery of native heart 01/10/2021   OSA (obstructive sleep apnea) 04/19/2020   Statin intolerance 01/14/2020   S/P ORIF (open reduction internal fixation) fracture 02/20/2018   DJD (degenerative joint disease) of knee 01/02/2017   Advanced care planning/counseling discussion 01/02/2017   Thrombocytopenia ([place]) 10/30/2015   Multiple sclerosis ([place])    Essential hypertension    Hyperlipidemia    Benign neoplasm of brain ([place]) 04/30/2013   Cerebral  meningioma ([place]) 04/29/2013   Malignant hypertension 04/29/2013   Obesity 04/15/2013    Lannette Avellino, PT, DPT 05/15/2021, 12:59 PM  [place] MAIN [facility] SERVICES [address] [place], [place], [address] Phone: [phone]   Fax:  [phone]  Name: JIANA LEMAIRE MRN: [id number] Date of Birth: 1950/12/15

## 2021-05-17 ENCOUNTER — Other Ambulatory Visit: Payer: Self-pay

## 2021-05-17 ENCOUNTER — Ambulatory Visit: Payer: Medicare Other | Admitting: Physical Therapy

## 2021-05-17 ENCOUNTER — Encounter: Payer: Self-pay | Admitting: Physical Therapy

## 2021-05-17 DIAGNOSIS — R2689 Other abnormalities of gait and mobility: Secondary | ICD-10-CM

## 2021-05-17 DIAGNOSIS — R278 Other lack of coordination: Secondary | ICD-10-CM

## 2021-05-17 DIAGNOSIS — R262 Difficulty in walking, not elsewhere classified: Secondary | ICD-10-CM

## 2021-05-17 DIAGNOSIS — M6281 Muscle weakness (generalized): Secondary | ICD-10-CM

## 2021-05-17 DIAGNOSIS — R2681 Unsteadiness on feet: Secondary | ICD-10-CM

## 2021-05-17 NOTE — Therapy (Signed)
[facility] SERVICES [address] [place], [place], [address] Phone: [phone]   Fax:  [phone]  Physical Therapy Treatment  Patient Details  Name: Leslie Smith MRN: [id number] Date of Birth: 1950-07-28 Referring Provider (PT): Dr. Darlin Priestly   Encounter Date: 05/17/2021   PT End of Session - 05/17/21 0958     Visit Number 41    Number of Visits 41    Date for PT Re-Evaluation 06/07/21    Authorization Type Medicare A&B; Medicaid 2nd; VL review after 25 visits    Authorization Time Period 6/56/81-07/17/49; previous cert 7/00/1749-4/49/6759    Authorization - Visit Number 85    Authorization - Number of Visits 25    PT Start Time 1638    PT Stop Time 1048    PT Time Calculation (min) 46 min    Equipment Utilized During Treatment Gait belt    Activity Tolerance Patient tolerated treatment well;Patient limited by fatigue    Behavior During Therapy WFL for tasks assessed/performed             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) 2001   multiple sclerosis diagnosed by MRI   PONV (postoperative nausea and vomiting)    severe   Thrombocytopenia ([place])     Past Surgical History:  Procedure Laterality Date   ABDOMINAL HYSTERECTOMY  2011   required wound vac for 6 weeks. ovaries had grown attached to her back bone   BRAIN SURGERY  2015   tumor resection-denies seizures   BREAST SURGERY Left 2014   biopsy. negative   HARDWARE REMOVAL Left 05/05/2018   Procedure: HARDWARE REMOVAL- LEFT ANKLE;  Surgeon: Hessie Knows, MD;   Location: [facility];  Service: Orthopedics;  Laterality: Left;   I & D EXTREMITY Left 08/25/2015   Procedure: IRRIGATION AND DEBRIDEMENT THUMB;  Surgeon: Iran Planas, MD;  Location: [facility];  Service: Orthopedics;  Laterality: Left;   ORIF ANKLE FRACTURE Left 02/20/2018   Procedure: OPEN REDUCTION INTERNAL FIXATION (ORIF) ANKLE FRACTURE;  Surgeon: Hessie Knows, MD;  Location: [facility];  Service: Orthopedics;  Laterality: Left;    There were no vitals filed for this visit.   Subjective Assessment - 05/17/21 1036     Subjective Patient reports some increase in swelling in LE with fluid retention. She reports her doctor did increase her fluid pill but its not helping;    Pertinent History Patient diagnosed with MS in 2001. Past medical history significant for HTN, MS, DJD right knee, Hyperlipidemia, Thrombocytopenia, S/p ORIF Left ankle fracture.    Limitations Lifting;Standing;Walking;House hold activities    How long can you sit comfortably? No restrictions    How long can you stand comfortably? No baseline ADL/IADL activity limited by standing tolerance    How long can you walk comfortably? today is limited by her walk to mailbox; maybe 15 min at evaluation    Patient Stated Goals Walk better and regain some lost strength.    Currently  in Pain? Yes    Pain Score 6     Pain Location Knee    Pain Orientation Right    Pain Descriptors / Indicators Aching;Sore    Pain Type Chronic pain    Pain Onset More than a month ago    Pain Frequency Intermittent    Aggravating Factors  worse with standing/walking    Pain Relieving Factors rest/pain meds    Effect of Pain on Daily Activities decreased activity tolerance;    Multiple Pain Sites No                [facility] PT Assessment - 05/17/21 0001       6 Minute Walk- Baseline   BP (mmHg) 147/70      6 Minute walk- Post Test   BP (mmHg) 216/131   after 5 min seated rest break: BP dropped to: 158/58   HR (bpm) 125    02 Sat (%RA) 99 %     Modified Borg Scale for Dyspnea 5- Strong or hard breathing      6 minute walk test results    Aerobic Endurance Distance Walked 1045    Endurance additional comments with 2 seated rest breaks; reports having increased fluid retention this week           TREATMENT:  Vitals assessed; BP 147/70  Instructed patient in 6 min walk test;     Neuro Re-ed: CGA and cues for sequencing and safety awareness  Standing on airex beam: -forward reciprocal tandem gait x4 laps -side stepping unsupported with intermittent rail assist x4 laps each direction -tandem stance unsupported 10 sec hold x2 reps each foot in front -feet apart:  Reach across body tapping cones in various height x10 reps each -tandem stance:  UE reach for cone and pass to other side x5 reps each direction;   Standing on airex: Feet apart:            heel/toe raises unsupported x15 reps Feet together:  Trunk rotation with arms across chest x2 reps each direction No unsteadiness reported, close supervision;   Stepping forward/backward airex to airex with hurdle in between unsupported x5 reps with min A for safety Side stepping airex to airex with hurdle in between x5 reps with intermittent rail assist Required min A and cues to increase step length for better foot clearance; Does have increased unsteadiness with RLE due to weakness and instability;   Standing staggered stance wide with one foot on each airex pad:  Unsupported standing with BUE arm raise x5 reps each foot in front;     Patient tolerated well. She continues to have elevated BP Following 6 min walk test. Patient does report feeling as though she is having increased fluid retention.  Instructed patient in advanced balance tasks. Utilized compliant surface to challenge stance control. Patient does require min A to CGA with most exercise. She had most instability with narrow base of support.                              PT Education -  05/17/21 0958     Education Details exercise technique/positioning;    Person(s) Educated Patient    Methods Explanation;Verbal cues    Comprehension Verbalized understanding;Returned demonstration;Verbal cues required;Need further instruction              PT Short Term Goals - 05/15/21 1224       PT SHORT TERM  GOAL #1   Title Pt will be independent with HEP in order to improve strength and balance in order to decrease fall risk and improve function at home and work.    Baseline 09/27/2020- Patient has no formal HEP in place, 5/19: independent; 7/12: not performing, handout is lost 8/17: depends on the day due to medication changes and pain 9/6: intermittent compliance, 9/27: intermittent compliant due MS flare up and knee pain; 11/2: hasn't been doing formal HEP, but has been active; has been traveling a lot; 12/6: mixed adherence due to holidays    Time 6    Period Weeks    Status Partially Met    Target Date 05/09/21               PT Long Term Goals - 05/15/21 1223       PT LONG TERM GOAL #1   Title Pt will improve FOTO score from 61  to target score of 64 to display perceived improvements in ability to complete ADL's and functional mobility activities.    Baseline 09/27/2020: FOTO=61, 10/26/20: 64%; 7/12: 58 8/17: 55% 9/6 63%, 9/27: 56%, 11/2: 62%    Time 8    Period Weeks    Status Partially Met    Target Date 06/06/21      PT LONG TERM GOAL #2   Title Pt will improve DGI by at least 3 points in order to demonstrate clinically significant improvement in balance and decreased risk for falls.    Baseline 09/27/2020-DGI=18/24, 5/19: 18/24 7/12: not tested; 7/15: 22/24    Time 8    Period Weeks    Status Achieved    Target Date 06/06/21      PT LONG TERM GOAL #3   Title Pt will improve ABC by at least 10 % in order to demonstrate clinically significant improvement in balance confidence.    Baseline 09/27/2020-68.1%, 5/19: 78% 7/12: not assessed; 7/15: 61.25    Time 8     Period Weeks    Status Achieved    Target Date 06/06/21      PT LONG TERM GOAL #4   Title Pt will decrease 5TSTS by at least 5 seconds (initial =23 sec with no UE support) in order to demonstrate clinically significant improvement in LE strength.    Baseline 09/27/2020= 23 sec. with No UE support, 5/19: 15 sec; 7/12: not assessed; 7/15: 17.68 sec 8/17: 13.8 seconds, 9/27: deferred due to knee pain;, 11/2: 14.5 sec without HHA    Time 8    Period Weeks    Status Achieved    Target Date 06/06/21      PT LONG TERM GOAL #5   Title Pt will decrease TUG to below 14 seconds/decrease in order to demonstrate decreased fall risk.    Baseline 09/27/2020- TUG score= 15.15 sec without UE support, 5/19: 8.89 sec; 12/19/20: not assessed; 7/15: 9.8 sec 9/6: 9.66 seconds, 11/2: 8.89 sec    Time 8    Period Weeks    Status Achieved    Target Date 06/06/21      PT LONG TERM GOAL #6   Title Patient will increase six minute walk test distance to >1000 for progression to community ambulator and improve gait ability with BP in functional level and moderate fatigue or less;    Baseline 4/26: 695 ft with one seated rest break, 5/19: 975 feet; 7/12: not safe to attempt, BP too high 8/17: 610 ft with multiple rest breaks, 9/27: 990 feet, high  BP 11/2: 1120 with high bp, 12/6: not formally assessed due to achiness and fatigue    Time 8    Period Weeks    Status Revised    Target Date 06/06/21      PT LONG TERM GOAL #7   Title Patient will increase Functional Gait Assessment score to >22/30 as to reduce fall risk and improve dynamic gait safety with community ambulation.    Baseline 9/6: 16/30, 9/27: Deferred due to time; 11/2: 18/30    Time 8    Period Weeks    Status Partially Met    Target Date 06/06/21      PT LONG TERM GOAL #8   Title Patient will tolerate 15 seconds of single leg stance without loss of balance to improve ability to get in and out of shower safely.    Baseline 9/6: L 9 seconds R 7  seconds, 9/27: R: 4-5 sec, limited by knee pain, L: 18 sec, 11/2: deferred;    Time 8    Period Weeks    Status Partially Met    Target Date 06/06/21                   Plan - 05/17/21 1053     Clinical Impression Statement Patient motivated and participated well within session. instructed patient in 6 min walk test. She continues to have elevated BP and increased shortness of breath. This is alleviated with short seated rest break. Patient instructed in advanced balance tasks. Instructed patient in static and dynamic balance on airex pad to challenge ankle control. Patient does require CGA to min A with increased instability with narrow base of support. She does have intermittent RLE knee buckling due to weakness. She would benefit from additional skilled PT intervention to improve strength, balance and mobility;    Personal Factors and Comorbidities Comorbidity 3+    Comorbidities HTN, MS, Arthritis, COPD    Examination-Activity Limitations Bend;Carry;Continence;Lift;Squat;Stairs;Stand    Examination-Participation Restrictions Community Activity;Yard Work    Stability/Clinical Decision Making Stable/Uncomplicated    Rehab Potential Poor    PT Frequency 2x / week    PT Duration 8 weeks    PT Treatment/Interventions ADLs/Self Care Home Management;Cryotherapy;Moist Heat;DME Instruction;Gait training;Stair training;Functional mobility training;Therapeutic activities;Therapeutic exercise;Balance training;Neuromuscular re-education;Patient/family education;Manual techniques;Passive range of motion    PT Next Visit Plan Limit AMB activity if BP conitnues to go to unsafe HTN (>[id number] systolic); review HEP for home including balance prn    PT Home Exercise Plan pt unaware of location of her original handout, has not been compliant, warrants review (12/19/20)    Consulted and Agree with Plan of Care Patient             Patient will benefit from skilled therapeutic intervention in order  to improve the following deficits and impairments:  Abnormal gait, Cardiopulmonary status limiting activity, Decreased activity tolerance, Decreased balance, Decreased coordination, Decreased endurance, Decreased mobility, Decreased range of motion, Decreased strength, Difficulty walking, Impaired flexibility, Obesity, Pain  Visit Diagnosis: Other abnormalities of gait and mobility  Other lack of coordination  Difficulty in walking, not elsewhere classified  Unsteadiness on feet  Muscle weakness (generalized)     Problem List Patient Active Problem List   Diagnosis Date Noted   Atherosclerosis of abdominal aorta ([place]) 01/10/2021   Coronary artery disease involving native coronary artery of native heart 01/10/2021   OSA (obstructive sleep apnea) 04/19/2020   Statin intolerance 01/14/2020   S/P ORIF (open reduction internal fixation) fracture 02/20/2018  DJD (degenerative joint disease) of knee 01/02/2017   Advanced care planning/counseling discussion 01/02/2017   Thrombocytopenia ([place]) 10/30/2015   Multiple sclerosis ([place])    Essential hypertension    Hyperlipidemia    Benign neoplasm of brain ([place]) 04/30/2013   Cerebral meningioma ([place]) 04/29/2013   Malignant hypertension 04/29/2013   Obesity 04/15/2013    Javaya [place], PT, DPT 05/17/2021, 10:56 AM  [facility] [address] [place], [place], [address] Phone: [phone]   Fax:  [phone]  Name: AQUA DENSLOW MRN: [id number] Date of Birth: September 15, 1950

## 2021-05-22 ENCOUNTER — Ambulatory Visit: Payer: Medicare Other | Admitting: Physical Therapy

## 2021-05-24 ENCOUNTER — Ambulatory Visit: Payer: Medicare Other | Admitting: Physical Therapy

## 2021-05-29 ENCOUNTER — Ambulatory Visit: Payer: Medicare Other | Admitting: Physical Therapy

## 2021-06-13 ENCOUNTER — Other Ambulatory Visit: Payer: Self-pay

## 2021-06-13 ENCOUNTER — Ambulatory Visit: Payer: Medicare Other | Attending: Neurology | Admitting: Physical Therapy

## 2021-06-13 ENCOUNTER — Encounter: Payer: Self-pay | Admitting: Physical Therapy

## 2021-06-13 VITALS — BP 152/56 | HR 69

## 2021-06-13 DIAGNOSIS — R2689 Other abnormalities of gait and mobility: Secondary | ICD-10-CM | POA: Insufficient documentation

## 2021-06-13 DIAGNOSIS — R2681 Unsteadiness on feet: Secondary | ICD-10-CM | POA: Diagnosis present

## 2021-06-13 DIAGNOSIS — R269 Unspecified abnormalities of gait and mobility: Secondary | ICD-10-CM | POA: Insufficient documentation

## 2021-06-13 DIAGNOSIS — R262 Difficulty in walking, not elsewhere classified: Secondary | ICD-10-CM | POA: Diagnosis present

## 2021-06-13 DIAGNOSIS — R278 Other lack of coordination: Secondary | ICD-10-CM | POA: Insufficient documentation

## 2021-06-13 DIAGNOSIS — M6281 Muscle weakness (generalized): Secondary | ICD-10-CM | POA: Insufficient documentation

## 2021-06-13 NOTE — Therapy (Signed)
[place] MAIN [facility] SERVICES [address] [place], [place], [address] Phone: [phone]   Fax:  [phone]  Physical Therapy Treatment/Recert  Patient Details  Name: Leslie Smith MRN: [id number] Date of Birth: [birth year] Referring Provider (PT): Dr. Darlin Priestly   Encounter Date: 06/13/2021   PT End of Session - 06/13/21 1103     Visit Number 42    Number of Visits 62    Date for PT Re-Evaluation 08/08/21    Authorization Type Medicare A&B; Medicaid 2nd; VL review after 25 visits    Authorization Time Period 1/79/15-0/5/69; previous cert 7/94/8016-5/53/7482    Authorization - Visit Number 68    Authorization - Number of Visits 25    PT Start Time 7078    PT Stop Time 1145    PT Time Calculation (min) 43 min    Equipment Utilized During Treatment Gait belt    Activity Tolerance Patient tolerated treatment well;Patient limited by fatigue    Behavior During Therapy WFL for tasks assessed/performed             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   Bladder incontinence    Cancer ([place]) 2014   brain tumor, resected   Cellulitis and abscess of leg 2019   2 episodes this year   Complication of anesthesia    DJD (degenerative joint disease) of knee 2018   Fall    Fatigue    History of blood transfusion    Hypertension    MS (multiple sclerosis) ([place])    Myelitis ([place])    Neuromuscular disorder ([place]) 2001   multiple sclerosis diagnosed by MRI   PONV (postoperative nausea and vomiting)    severe   Thrombocytopenia ([place])     Past Surgical History:  Procedure Laterality Date   ABDOMINAL HYSTERECTOMY  2011   required wound vac for 6 weeks. ovaries had grown attached to her back bone   BRAIN SURGERY  2015   tumor resection-denies seizures   BREAST SURGERY Left 2014   biopsy. negative   HARDWARE REMOVAL Left 05/05/2018   Procedure: HARDWARE REMOVAL- LEFT ANKLE;  Surgeon: Hessie Knows,  MD;  Location: [facility];  Service: Orthopedics;  Laterality: Left;   I & D EXTREMITY Left 08/25/2015   Procedure: IRRIGATION AND DEBRIDEMENT THUMB;  Surgeon: Iran Planas, MD;  Location: [place];  Service: Orthopedics;  Laterality: Left;   ORIF ANKLE FRACTURE Left 02/20/2018   Procedure: OPEN REDUCTION INTERNAL FIXATION (ORIF) ANKLE FRACTURE;  Surgeon: Hessie Knows, MD;  Location: [facility];  Service: Orthopedics;  Laterality: Left;    Vitals:   06/13/21 1105  BP: (!) 152/56  Pulse: 69     Subjective Assessment - 06/13/21 1105     Subjective Patient reports being a little congested. She was sick with a sinus infection but states she is better than she was. She had a great Christmas and was busy doing a lot of cooking;    Pertinent History Patient diagnosed with MS in 2001. Past medical history significant for HTN, MS, DJD right knee, Hyperlipidemia, Thrombocytopenia, S/p ORIF Left ankle fracture.    Limitations Lifting;Standing;Walking;House hold activities    How long can you sit comfortably? No restrictions    How long can you stand comfortably? No baseline ADL/IADL activity limited by standing tolerance    How long can you walk comfortably? today is limited by her walk to mailbox; maybe 15 min at evaluation  Patient Stated Goals Walk better and regain some lost strength.    Currently in Pain? Yes    Pain Score 8     Pain Location Back    Pain Orientation Mid    Pain Descriptors / Indicators Sharp;Burning    Pain Type Acute pain    Pain Onset Today    Pain Frequency Intermittent    Aggravating Factors  unknown    Pain Relieving Factors nothing    Effect of Pain on Daily Activities decreased activity tolerance;    Multiple Pain Sites No                [facility] PT Assessment - 06/13/21 0001       Observation/Other Assessments   Focus on Therapeutic Outcomes (FOTO)  54%, slightly more impaired from 04/11/21 which was 62%      Dynamic Gait Index   Level Surface Normal     Change in Gait Speed Normal    Gait with Horizontal Head Turns Mild Impairment    Gait with Vertical Head Turns Mild Impairment    Gait and Pivot Turn Normal    Step Over Obstacle Normal    Step Around Obstacles Mild Impairment    Steps Moderate Impairment    Total Score 19    DGI comment: increased risk for falls      Timed Up and Go Test   Normal TUG (seconds) 8.14    TUG Comments without AD      Functional Gait  Assessment   Gait Level Surface Walks 20 ft in less than 7 sec but greater than 5.5 sec, uses assistive device, slower speed, mild gait deviations, or deviates 6-10 in outside of the 12 in walkway width.    Change in Gait Speed Able to smoothly change walking speed without loss of balance or gait deviation. Deviate no more than 6 in outside of the 12 in walkway width.    Gait with Horizontal Head Turns Performs head turns smoothly with slight change in gait velocity (eg, minor disruption to smooth gait path), deviates 6-10 in outside 12 in walkway width, or uses an assistive device.    Gait with Vertical Head Turns Performs task with slight change in gait velocity (eg, minor disruption to smooth gait path), deviates 6 - 10 in outside 12 in walkway width or uses assistive device    Gait and Pivot Turn Pivot turns safely in greater than 3 sec and stops with no loss of balance, or pivot turns safely within 3 sec and stops with mild imbalance, requires small steps to catch balance.    Step Over Obstacle Is able to step over one shoe box (4.5 in total height) without changing gait speed. No evidence of imbalance.    Gait with Narrow Base of Support Ambulates less than 4 steps heel to toe or cannot perform without assistance.    Gait with Eyes Closed Walks 20 ft, slow speed, abnormal gait pattern, evidence for imbalance, deviates 10-15 in outside 12 in walkway width. Requires more than 9 sec to ambulate 20 ft.    Ambulating Backwards Walks 20 ft, uses assistive device, slower speed, mild  gait deviations, deviates 6-10 in outside 12 in walkway width.    Steps Two feet to a stair, must use rail.    Total Score 17              TREATMENT: PT monitored vitals, see above  Instructed patient in outcome measures to address progress towards  goals. PT instructed patient in Functional Gait assessment and DGI; She exhibits slight decline in balance compared to several weeks ago. This is likely due to missed appointments and imbalance from recent sickness  Patient able to hold SLS: R: 5 sec, L: 20 sec; She is limited on the right due to knee discomfort;   She has been sick with increased congestion and respiratory infection. As a result, 6 min walk test was deferred She also reports increased RLE knee discomfort, therefore 5 times sit<>stand test deferred  Patient does become short of breath with prolonged standing/walking tasks, requiring short seated rest breaks  Vitals at end of session, BP 165/58, HR 68  She would benefit from additional skilled PT Intervention to improve strength, balance and mobility;                        PT Education - 06/13/21 1108     Education Details exercise technique/positioning;    Person(s) Educated Patient    Methods Explanation;Verbal cues    Comprehension Verbalized understanding;Returned demonstration;Verbal cues required;Need further instruction              PT Short Term Goals - 06/13/21 1109       PT SHORT TERM GOAL #1   Title Pt will be independent with HEP in order to improve strength and balance in order to decrease fall risk and improve function at home and work.    Baseline 09/27/2020- Patient has no formal HEP in place, 5/19: independent; 7/12: not performing, handout is lost 8/17: depends on the day due to medication changes and pain 9/6: intermittent compliance, 9/27: intermittent compliant due MS flare up and knee pain; 11/2: hasn't been doing formal HEP, but has been active; has been traveling a  lot; 12/6: mixed adherence due to holidays; 1/4: mixed adherenc with holidays and traveling;    Time 4    Period Weeks    Status Partially Met    Target Date 07/11/21               PT Long Term Goals - 06/13/21 1109       PT LONG TERM GOAL #1   Title Pt will improve FOTO score from 61  to target score of 64 to display perceived improvements in ability to complete ADL's and functional mobility activities.    Baseline 09/27/2020: FOTO=61, 10/26/20: 64%; 7/12: 58 8/17: 55% 9/6 63%, 9/27: 56%, 11/2: 62%, 1/4: 54%    Time 8    Period Weeks    Status Partially Met    Target Date 08/08/21      PT LONG TERM GOAL #2   Title Pt will improve DGI by at least 3 points in order to demonstrate clinically significant improvement in balance and decreased risk for falls.    Baseline 09/27/2020-DGI=18/24, 5/19: 18/24 7/12: not tested; 7/15: 22/24, 1/4: 19/24    Time 8    Period Weeks    Status On-going    Target Date 08/08/21      PT LONG TERM GOAL #3   Title Pt will improve ABC by at least 10 % in order to demonstrate clinically significant improvement in balance confidence.    Baseline 09/27/2020-68.1%, 5/19: 78% 7/12: not assessed; 7/15: 61.25    Time 8    Period Weeks    Status On-going    Target Date 08/08/21      PT LONG TERM GOAL #4   Title Pt will decrease 5TSTS  by at least 5 seconds (initial =23 sec with no UE support) in order to demonstrate clinically significant improvement in LE strength.    Baseline 09/27/2020= 23 sec. with No UE support, 5/19: 15 sec; 7/12: not assessed; 7/15: 17.68 sec 8/17: 13.8 seconds, 9/27: deferred due to knee pain;, 11/2: 14.5 sec without HHA, 1/4: deferred due to knee pain;    Time 8    Period Weeks    Status Achieved    Target Date 08/08/21      PT LONG TERM GOAL #5   Title Pt will decrease TUG to below 14 seconds/decrease in order to demonstrate decreased fall risk.    Baseline 09/27/2020- TUG score= 15.15 sec without UE support, 5/19: 8.89 sec;  12/19/20: not assessed; 7/15: 9.8 sec 9/6: 9.66 seconds, 11/2: 8.89 sec, 1/4: 8.12 sec;    Time 8    Period Weeks    Status Achieved    Target Date 08/08/21      PT LONG TERM GOAL #6   Title Patient will increase six minute walk test distance to >1000 for progression to community ambulator and improve gait ability with BP in functional level and moderate fatigue or less;    Baseline 4/26: 695 ft with one seated rest break, 5/19: 975 feet; 7/12: not safe to attempt, BP too high 8/17: 610 ft with multiple rest breaks, 9/27: 990 feet, high BP 11/2: 1120 with high bp, 12/6: not formally assessed due to achiness and fatigue, 1/4: deferred due to recent sickness/congestion    Time 8    Period Weeks    Status On-going    Target Date 08/08/21      PT LONG TERM GOAL #7   Title Patient will increase Functional Gait Assessment score to >22/30 as to reduce fall risk and improve dynamic gait safety with community ambulation.    Baseline 9/6: 16/30, 9/27: Deferred due to time; 11/2: 18/30, 1/4: 17/30    Time 8    Period Weeks    Status Partially Met    Target Date 08/08/21      PT LONG TERM GOAL #8   Title Patient will tolerate 15 seconds of single leg stance without loss of balance to improve ability to get in and out of shower safely.    Baseline 9/6: L 9 seconds R 7 seconds, 9/27: R: 4-5 sec, limited by knee pain, L: 18 sec, 11/2: deferred;, 1/4: R: 5 sec, L: 20 sec; right is limited by knee pain;    Time 8    Period Weeks    Status Partially Met    Target Date 08/08/21                   Plan - 06/13/21 1207     Clinical Impression Statement Patient motivated and participated well within session. She was instructed in outcome measures to address progress towards goals. Some outcomes deferred due to right knee pain and recent illness. Patient does fatigue quickly and experienced increased shortness of breath/cough from recent cold. She also exhibits increased imbalance. This could be  related to recent missed appointments and/or recent cold. Patient did miss 2 weeks of PT due to Christmas holidays and travelling. She reports decreased HEP adherence due to travelling and time with family. She is motivated to get back on track and improve mobility. Patient would benefit from additional skilled PT Intervention to improve strength, balance and mobility;    Personal Factors and Comorbidities Comorbidity 3+    Comorbidities  HTN, MS, Arthritis, COPD    Examination-Activity Limitations Bend;Carry;Continence;Lift;Squat;Stairs;Stand    Examination-Participation Restrictions Community Activity;Yard Work    Stability/Clinical Decision Making Stable/Uncomplicated    Rehab Potential Poor    PT Frequency 2x / week    PT Duration 8 weeks    PT Treatment/Interventions ADLs/Self Care Home Management;Cryotherapy;Moist Heat;DME Instruction;Gait training;Stair training;Functional mobility training;Therapeutic activities;Therapeutic exercise;Balance training;Neuromuscular re-education;Patient/family education;Manual techniques;Passive range of motion    PT Next Visit Plan Limit AMB activity if BP conitnues to go to unsafe HTN (>[id number] systolic); review HEP for home including balance prn    PT Home Exercise Plan pt unaware of location of her original handout, has not been compliant, warrants review (12/19/20)    Consulted and Agree with Plan of Care Patient             Patient will benefit from skilled therapeutic intervention in order to improve the following deficits and impairments:  Abnormal gait, Cardiopulmonary status limiting activity, Decreased activity tolerance, Decreased balance, Decreased coordination, Decreased endurance, Decreased mobility, Decreased range of motion, Decreased strength, Difficulty walking, Impaired flexibility, Obesity, Pain  Visit Diagnosis: Other abnormalities of gait and mobility  Other lack of coordination  Difficulty in walking, not elsewhere  classified  Unsteadiness on feet  Muscle weakness (generalized)  Abnormality of gait and mobility     Problem List Patient Active Problem List   Diagnosis Date Noted   Atherosclerosis of abdominal aorta ([place]) 01/10/2021   Coronary artery disease involving native coronary artery of native heart 01/10/2021   OSA (obstructive sleep apnea) 04/19/2020   Statin intolerance 01/14/2020   S/P ORIF (open reduction internal fixation) fracture 02/20/2018   DJD (degenerative joint disease) of knee 01/02/2017   Advanced care planning/counseling discussion 01/02/2017   Thrombocytopenia ([place]) 10/30/2015   Multiple sclerosis ([place])    Essential hypertension    Hyperlipidemia    Benign neoplasm of brain ([place]) 04/30/2013   Cerebral meningioma ([place]) 04/29/2013   Malignant hypertension 04/29/2013   Obesity 04/15/2013    Nathali Vent, PT, DPT 06/13/2021, 12:13 PM  [place] [facility] MAIN [facility] SERVICES [address] [place], [place], [address] Phone: [phone]   Fax:  [phone]  Name: Leslie Smith MRN: [id number] Date of Birth: 1950/09/10

## 2021-06-19 ENCOUNTER — Ambulatory Visit: Payer: Medicare Other | Admitting: Physical Therapy

## 2021-06-21 ENCOUNTER — Other Ambulatory Visit: Payer: Self-pay

## 2021-06-21 ENCOUNTER — Ambulatory Visit: Payer: Medicare Other | Admitting: Physical Therapy

## 2021-06-21 ENCOUNTER — Encounter: Payer: Self-pay | Admitting: Physical Therapy

## 2021-06-21 DIAGNOSIS — R2681 Unsteadiness on feet: Secondary | ICD-10-CM

## 2021-06-21 DIAGNOSIS — R2689 Other abnormalities of gait and mobility: Secondary | ICD-10-CM | POA: Diagnosis not present

## 2021-06-21 DIAGNOSIS — M6281 Muscle weakness (generalized): Secondary | ICD-10-CM

## 2021-06-21 DIAGNOSIS — R278 Other lack of coordination: Secondary | ICD-10-CM

## 2021-06-21 DIAGNOSIS — R262 Difficulty in walking, not elsewhere classified: Secondary | ICD-10-CM

## 2021-06-21 NOTE — Therapy (Signed)
[facility] MAIN [facility] SERVICES [address] [place], [place], [address] Phone: [phone]   Fax:  [phone]  Physical Therapy Treatment  Patient Details  Name: Leslie Smith MRN: [id number] Date of Birth: 06-04-1951 Referring Provider (PT): Dr. Marian Sorrow   Encounter Date: 06/21/2021   PT End of Session - 06/21/21 0940     Visit Number 43    Number of Visits 72    Date for PT Re-Evaluation 08/08/21    Authorization Type Medicare A&B; Medicaid 2nd; VL review after 25 visits    Authorization Time Period 12/19/20-02/13/21; previous cert 10/08/9927-03/06/4194    Authorization - Visit Number 20    Authorization - Number of Visits 25    PT Start Time [phone]    PT Stop Time 1019    PT Time Calculation (min) 41 min    Equipment Utilized During Treatment Gait belt    Activity Tolerance Patient tolerated treatment well;Patient limited by fatigue    Behavior During Therapy [facility] for tasks assessed/performed             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 ovaries had grown attached to her back bone   BRAIN SURGERY  2015   tumor resection-denies seizures   BREAST SURGERY Left 2014   biopsy. negative   HARDWARE REMOVAL Left 05/05/2018   Procedure: HARDWARE REMOVAL- LEFT ANKLE;  Surgeon: Kennedy Bucker, MD;   Location: [facility] ORS;  Service: Orthopedics;  Laterality: Left;   I & D EXTREMITY Left 08/25/2015   Procedure: IRRIGATION AND DEBRIDEMENT THUMB;  Surgeon: Bradly Bienenstock, MD;  Location: [facility] OR;  Service: Orthopedics;  Laterality: Left;   ORIF ANKLE FRACTURE Left 02/20/2018   Procedure: OPEN REDUCTION INTERNAL FIXATION (ORIF) ANKLE FRACTURE;  Surgeon: Kennedy Bucker, MD;  Location: [facility] ORS;  Service: Orthopedics;  Laterality: Left;    There were no vitals filed for this visit.   Subjective Assessment - 06/21/21 0939     Subjective Patient reports feeling a little better. She is glad to get over some of her cold. She reports minimal pain; She states that she has been resting a lot and today is her first day getting out and moving;    Pertinent History Patient diagnosed with MS in 2001. Past medical history significant for HTN, MS, DJD right knee, Hyperlipidemia, Thrombocytopenia, S/p ORIF Left ankle fracture.    Limitations Lifting;Standing;Walking;House hold activities    How long can you sit comfortably? No restrictions    How long can you stand comfortably? No baseline ADL/IADL activity limited by standing tolerance    How long can you walk comfortably? today is limited by her walk to mailbox; maybe 15 min at evaluation  Patient Stated Goals Walk better and regain some lost strength.    Currently in Pain? No/denies    Pain Onset Today                TREATMENT: 2# ankle weight: BLE heel/toe raises x15 reps,  Hip flexion march x15 reps each LE Hip abduction x15 reps each LE with cues to avoid hip ER to isolate hip abductors, patient had difficulty reporting increased fatigue;  Seated LAQ 2# x15 reps each LE with cues to DF foot for better stretch in hamstring; Patient reports increased fatigue with 2# ankle weight stating she felt like the weight was heavier than previous sessions;   BP 146/57, HR 78  Stepping over orange hurdle forward/backward x10 reps, side/side x10 reps While  wearing 2# ankle weight; Required intermittent rail assist for safety;   Standing on airex pad: -tandem stance:  Unsupported standing 30 sec hold x1 rep each foot in front  Unsupported standing with head turns side/side x10 reps each  -feet together: cone pass side/side x8 reps each direction reaching across body; Patient required min A for safety and cues for increased trunk control for better stance control on airex pad.   Tolerated session fair. She does report increased fatigue with strengthening with ankle weight. Patient denies any increase in knee pain but does report occasional knee buckle especially when side stepping over orange hurdle. Patient does exhibit decreased ankle strategies while on compliant surface requiring CGA to min A for safety especially when reaching outside base of support;                         PT Education - 06/21/21 0940     Education Details exercise technique/positioning;    Person(s) Educated Patient    Methods Explanation;Verbal cues    Comprehension Verbalized understanding;Returned demonstration;Verbal cues required;Need further instruction              PT Short Term Goals - 06/13/21 1109       PT SHORT TERM GOAL #1   Title Pt will be independent with HEP in order to improve strength and balance in order to decrease fall risk and improve function at home and work.    Baseline 09/27/2020- Patient has no formal HEP in place, 5/19: independent; 7/12: not performing, handout is lost 8/17: depends on the day due to medication changes and pain 9/6: intermittent compliance, 9/27: intermittent compliant due MS flare up and knee pain; 11/2: hasn't been doing formal HEP, but has been active; has been traveling a lot; 12/6: mixed adherence due to holidays; 1/4: mixed adherenc with holidays and traveling;    Time 4    Period Weeks    Status Partially Met    Target Date 07/11/21               PT Long Term Goals - 06/13/21 1109        PT LONG TERM GOAL #1   Title Pt will improve FOTO score from 61  to target score of 64 to display perceived improvements in ability to complete ADL's and functional mobility activities.    Baseline 09/27/2020: FOTO=61, 10/26/20: 64%; 7/12: 58 8/17: 55% 9/6 63%, 9/27: 56%, 11/2: 62%, 1/4: 54%    Time 8    Period Weeks    Status Partially Met    Target Date 08/08/21      PT LONG TERM GOAL #2   Title Pt will improve DGI by  at least 3 points in order to demonstrate clinically significant improvement in balance and decreased risk for falls.    Baseline 09/27/2020-DGI=18/24, 5/19: 18/24 7/12: not tested; 7/15: 22/24, 1/4: 19/24    Time 8    Period Weeks    Status On-going    Target Date 08/08/21      PT LONG TERM GOAL #3   Title Pt will improve ABC by at least 10 % in order to demonstrate clinically significant improvement in balance confidence.    Baseline 09/27/2020-68.1%, 5/19: 78% 7/12: not assessed; 7/15: 61.25    Time 8    Period Weeks    Status On-going    Target Date 08/08/21      PT LONG TERM GOAL #4   Title Pt will decrease 5TSTS by at least 5 seconds (initial =23 sec with no UE support) in order to demonstrate clinically significant improvement in LE strength.    Baseline 09/27/2020= 23 sec. with No UE support, 5/19: 15 sec; 7/12: not assessed; 7/15: 17.68 sec 8/17: 13.8 seconds, 9/27: deferred due to knee pain;, 11/2: 14.5 sec without HHA, 1/4: deferred due to knee pain;    Time 8    Period Weeks    Status Achieved    Target Date 08/08/21      PT LONG TERM GOAL #5   Title Pt will decrease TUG to below 14 seconds/decrease in order to demonstrate decreased fall risk.    Baseline 09/27/2020- TUG score= 15.15 sec without UE support, 5/19: 8.89 sec; 12/19/20: not assessed; 7/15: 9.8 sec 9/6: 9.66 seconds, 11/2: 8.89 sec, 1/4: 8.12 sec;    Time 8    Period Weeks    Status Achieved    Target Date 08/08/21      PT LONG TERM GOAL #6   Title Patient will increase six minute  walk test distance to >1000 for progression to community ambulator and improve gait ability with BP in functional level and moderate fatigue or less;    Baseline 4/26: 695 ft with one seated rest break, 5/19: 975 feet; 7/12: not safe to attempt, BP too high 8/17: 610 ft with multiple rest breaks, 9/27: 990 feet, high BP 11/2: 1120 with high bp, 12/6: not formally assessed due to achiness and fatigue, 1/4: deferred due to recent sickness/congestion    Time 8    Period Weeks    Status On-going    Target Date 08/08/21      PT LONG TERM GOAL #7   Title Patient will increase Functional Gait Assessment score to >22/30 as to reduce fall risk and improve dynamic gait safety with community ambulation.    Baseline 9/6: 16/30, 9/27: Deferred due to time; 11/2: 18/30, 1/4: 17/30    Time 8    Period Weeks    Status Partially Met    Target Date 08/08/21      PT LONG TERM GOAL #8   Title Patient will tolerate 15 seconds of single leg stance without loss of balance to improve ability to get in and out of shower safely.    Baseline 9/6: L 9 seconds R 7 seconds, 9/27: R: 4-5 sec, limited by knee pain, L: 18 sec, 11/2: deferred;, 1/4: R: 5 sec, L: 20 sec; right is limited by knee pain;    Time 8    Period Weeks    Status Partially Met    Target Date 08/08/21  Plan - 06/21/21 1223     Clinical Impression Statement  Patient motivated and participated well within session. She is still getting over her cold but reports feeling better than previous visit. She also reports minimal pain. PT instructed patient in advanced LE strengthening exercise. She does report increased fatigue and heaviness with 2# ankle weights. Patient does require min VCs for proper exercise technique. Patient also instructed in advanced balance exercise utilizing compliant surface. Patient does require CGA for safety. She has increased difficulty with narrow base of support but was able to maintain balance. Patient  was able to reach outside base of support with fair stance control. She would benefit from additional skilled PT Intervention to improve strength, balance and mobility;    Personal Factors and Comorbidities Comorbidity 3+    Comorbidities HTN, MS, Arthritis, COPD    Examination-Activity Limitations Bend;Carry;Continence;Lift;Squat;Stairs;Stand    Examination-Participation Restrictions Community Activity;Yard Work    Stability/Clinical Decision Making Stable/Uncomplicated    Rehab Potential Poor    PT Frequency 2x / week    PT Duration 8 weeks    PT Treatment/Interventions ADLs/Self Care Home Management;Cryotherapy;Moist Heat;DME Instruction;Gait training;Stair training;Functional mobility training;Therapeutic activities;Therapeutic exercise;Balance training;Neuromuscular re-education;Patient/family education;Manual techniques;Passive range of motion    PT Next Visit Plan Limit AMB activity if BP conitnues to go to unsafe HTN (>035DHRC systolic); review HEP for home including balance prn    PT Home Exercise Plan pt unaware of location of her original handout, has not been compliant, warrants review (12/19/20)    Consulted and Agree with Plan of Care Patient             Patient will benefit from skilled therapeutic intervention in order to improve the following deficits and impairments:  Abnormal gait, Cardiopulmonary status limiting activity, Decreased activity tolerance, Decreased balance, Decreased coordination, Decreased endurance, Decreased mobility, Decreased range of motion, Decreased strength, Difficulty walking, Impaired flexibility, Obesity, Pain  Visit Diagnosis: Other abnormalities of gait and mobility  Other lack of coordination  Difficulty in walking, not elsewhere classified  Muscle weakness (generalized)  Unsteadiness on feet     Problem List Patient Active Problem List   Diagnosis Date Noted   Atherosclerosis of abdominal aorta ([place]) 01/10/2021   Coronary artery  disease involving native coronary artery of native heart 01/10/2021   OSA (obstructive sleep apnea) 04/19/2020   Statin intolerance 01/14/2020   S/P ORIF (open reduction internal fixation) fracture 02/20/2018   DJD (degenerative joint disease) of knee 01/02/2017   Advanced care planning/counseling discussion 01/02/2017   Thrombocytopenia ([place]) 10/30/2015   Multiple sclerosis ([place])    Essential hypertension    Hyperlipidemia    Benign neoplasm of brain ([place]) 04/30/2013   Cerebral meningioma ([place]) 04/29/2013   Malignant hypertension 04/29/2013   Obesity 04/15/2013    Shimshon Narula, PT, DPT 06/21/2021, 1:58 PM  [facility] MAIN [facility] SERVICES [place], [place], [address] Phone: [phone]   Fax:  [phone]  Name: CHESSA BARRASSO MRN: [id number] Date of Birth: Jan 23, 1951

## 2021-06-26 ENCOUNTER — Other Ambulatory Visit: Payer: Self-pay

## 2021-06-26 ENCOUNTER — Encounter: Payer: Self-pay | Admitting: Physical Therapy

## 2021-06-26 ENCOUNTER — Ambulatory Visit: Payer: Medicare Other | Admitting: Physical Therapy

## 2021-06-26 DIAGNOSIS — M6281 Muscle weakness (generalized): Secondary | ICD-10-CM

## 2021-06-26 DIAGNOSIS — R2689 Other abnormalities of gait and mobility: Secondary | ICD-10-CM

## 2021-06-26 DIAGNOSIS — R262 Difficulty in walking, not elsewhere classified: Secondary | ICD-10-CM

## 2021-06-26 DIAGNOSIS — R2681 Unsteadiness on feet: Secondary | ICD-10-CM

## 2021-06-26 DIAGNOSIS — R278 Other lack of coordination: Secondary | ICD-10-CM

## 2021-06-26 NOTE — Therapy (Signed)
[place] MAIN [facility] SERVICES [address] [place], [place], [address] Phone: [phone]   Fax:  [phone]  Physical Therapy Treatment  Patient Details  Name: Leslie Smith MRN: [id number] Date of Birth: Oct 06, 1950 Referring Provider (PT): Dr. Darlin Priestly   Encounter Date: 06/26/2021   PT End of Session - 06/26/21 1202     Visit Number 12    Number of Visits 71    Date for PT Re-Evaluation 08/08/21    Authorization Type Medicare A&B; Medicaid 2nd; VL review after 25 visits    Authorization Time Period 12/08/14-0/1/09; previous cert 08/31/5571-07/30/2540    Authorization - Visit Number 20    Authorization - Number of Visits 25    PT Start Time 7062    PT Stop Time 1230    PT Time Calculation (min) 42 min    Equipment Utilized During Treatment Gait belt    Activity Tolerance Patient tolerated treatment well;Patient limited by fatigue    Behavior During Therapy WFL for tasks assessed/performed             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[place]) 09/15/2014   Bladder incontinence    Cancer ([place]) 2014   brain tumor, resected   Cellulitis and abscess of leg 2019   2 episodes this year   Complication of anesthesia    DJD (degenerative joint disease) of knee 2018   Fall    Fatigue    History of blood transfusion    Hypertension    MS (multiple sclerosis) ([place])    Myelitis ([place])    Neuromuscular disorder ([place]) 2001   multiple sclerosis diagnosed by MRI   PONV (postoperative nausea and vomiting)    severe   Thrombocytopenia ([place])     Past Surgical History:  Procedure Laterality Date   ABDOMINAL HYSTERECTOMY  2011   required wound vac for 6 weeks. ovaries had grown attached to her back bone   BRAIN SURGERY  2015   tumor resection-denies seizures   BREAST SURGERY Left 2014   biopsy. negative   HARDWARE REMOVAL Left 05/05/2018   Procedure: HARDWARE REMOVAL- LEFT ANKLE;  Surgeon: Hessie Knows, MD;   Location: [facility];  Service: Orthopedics;  Laterality: Left;   I & D EXTREMITY Left 08/25/2015   Procedure: IRRIGATION AND DEBRIDEMENT THUMB;  Surgeon: Iran Planas, MD;  Location: [facility];  Service: Orthopedics;  Laterality: Left;   ORIF ANKLE FRACTURE Left 02/20/2018   Procedure: OPEN REDUCTION INTERNAL FIXATION (ORIF) ANKLE FRACTURE;  Surgeon: Hessie Knows, MD;  Location: [facility];  Service: Orthopedics;  Laterality: Left;    There were no vitals filed for this visit.   Subjective Assessment - 06/26/21 1153     Subjective Patient reports still getting over cold and is still weak. She has to get up early tomorrow to help make Chicken pies at church;    Pertinent History Patient diagnosed with MS in 2001. Past medical history significant for HTN, MS, DJD right knee, Hyperlipidemia, Thrombocytopenia, S/p ORIF Left ankle fracture.    Limitations Lifting;Standing;Walking;House hold activities    How long can you sit comfortably? No restrictions    How long can you stand comfortably? No baseline ADL/IADL activity limited by standing tolerance    How long can you walk comfortably? today is limited by her walk to mailbox; maybe 15 min at evaluation    Patient Stated Goals Walk better and regain some lost strength.    Currently  in Pain? Yes    Pain Score 4     Pain Location Knee    Pain Orientation Right    Pain Descriptors / Indicators Aching;Sore    Pain Type Chronic pain    Pain Onset More than a month ago    Pain Frequency Intermittent    Aggravating Factors  worse with movement    Pain Relieving Factors rest/shots    Effect of Pain on Daily Activities decreased activity tolerance;    Multiple Pain Sites No                    TREATMENT: Ex: 2# ankle weight: BLE heel/toe raises x15 reps,  Hip abduction x20 reps each LE with cues to avoid hip ER to isolate hip abductors, patient had difficulty reporting increased fatigue;   Seated LAQ 2# x15 reps each LE with cues to DF  foot for better stretch in hamstring; Patient reports increased fatigue with 2# ankle weight stating she felt like the weight was heavier than previous sessions;      NMR: Standing on airex pad: Forward/backward step up on airex unsupported: Looking down x5 reps, no difficulty Looking to side x5 reps each with minimal difficulty; reports most difficulty is soreness in knee;    -tandem stance with feet slightly apart but narrow base of support: Unsupported 10 sec hold x1 reps Unsupported with head turns side/side x5 reps each direction with min A for safety;   Side step side/side airex to airex unsupported x10 reps each Progressed to side step with contralateral leg lift (SLS) x5 reps each with min A for safety; does exhibit increased ankle instability with unsupported SLS;   Tolerated session fair. She does report increased fatigue with strengthening with ankle weight. Patient denies any increase in knee pain but does report occasional knee buckle especially when side stepping on unstable surface; Patient does exhibit decreased ankle strategies while on compliant surface requiring CGA to min A for safety especially when reaching outside base of support;                               PT Education - 06/26/21 1202     Education Details exercise technique/positioning;    Person(s) Educated Patient    Methods Explanation;Verbal cues    Comprehension Verbalized understanding;Returned demonstration;Verbal cues required;Need further instruction              PT Short Term Goals - 06/13/21 1109       PT SHORT TERM GOAL #1   Title Pt will be independent with HEP in order to improve strength and balance in order to decrease fall risk and improve function at home and work.    Baseline 09/27/2020- Patient has no formal HEP in place, 5/19: independent; 7/12: not performing, handout is lost 8/17: depends on the day due to medication changes and pain 9/6: intermittent  compliance, 9/27: intermittent compliant due MS flare up and knee pain; 11/2: hasn't been doing formal HEP, but has been active; has been traveling a lot; 12/6: mixed adherence due to holidays; 1/4: mixed adherenc with holidays and traveling;    Time 4    Period Weeks    Status Partially Met    Target Date 07/11/21               PT Long Term Goals - 06/13/21 1109       PT LONG TERM GOAL #1  Title Pt will improve FOTO score from 61  to target score of 64 to display perceived improvements in ability to complete ADL's and functional mobility activities.    Baseline 09/27/2020: FOTO=61, 10/26/20: 64%; 7/12: 58 8/17: 55% 9/6 63%, 9/27: 56%, 11/2: 62%, 1/4: 54%    Time 8    Period Weeks    Status Partially Met    Target Date 08/08/21      PT LONG TERM GOAL #2   Title Pt will improve DGI by at least 3 points in order to demonstrate clinically significant improvement in balance and decreased risk for falls.    Baseline 09/27/2020-DGI=18/24, 5/19: 18/24 7/12: not tested; 7/15: 22/24, 1/4: 19/24    Time 8    Period Weeks    Status On-going    Target Date 08/08/21      PT LONG TERM GOAL #3   Title Pt will improve ABC by at least 10 % in order to demonstrate clinically significant improvement in balance confidence.    Baseline 09/27/2020-68.1%, 5/19: 78% 7/12: not assessed; 7/15: 61.25    Time 8    Period Weeks    Status On-going    Target Date 08/08/21      PT LONG TERM GOAL #4   Title Pt will decrease 5TSTS by at least 5 seconds (initial =23 sec with no UE support) in order to demonstrate clinically significant improvement in LE strength.    Baseline 09/27/2020= 23 sec. with No UE support, 5/19: 15 sec; 7/12: not assessed; 7/15: 17.68 sec 8/17: 13.8 seconds, 9/27: deferred due to knee pain;, 11/2: 14.5 sec without HHA, 1/4: deferred due to knee pain;    Time 8    Period Weeks    Status Achieved    Target Date 08/08/21      PT LONG TERM GOAL #5   Title Pt will decrease TUG to below  14 seconds/decrease in order to demonstrate decreased fall risk.    Baseline 09/27/2020- TUG score= 15.15 sec without UE support, 5/19: 8.89 sec; 12/19/20: not assessed; 7/15: 9.8 sec 9/6: 9.66 seconds, 11/2: 8.89 sec, 1/4: 8.12 sec;    Time 8    Period Weeks    Status Achieved    Target Date 08/08/21      PT LONG TERM GOAL #6   Title Patient will increase six minute walk test distance to >1000 for progression to community ambulator and improve gait ability with BP in functional level and moderate fatigue or less;    Baseline 4/26: 695 ft with one seated rest break, 5/19: 975 feet; 7/12: not safe to attempt, BP too high 8/17: 610 ft with multiple rest breaks, 9/27: 990 feet, high BP 11/2: 1120 with high bp, 12/6: not formally assessed due to achiness and fatigue, 1/4: deferred due to recent sickness/congestion    Time 8    Period Weeks    Status On-going    Target Date 08/08/21      PT LONG TERM GOAL #7   Title Patient will increase Functional Gait Assessment score to >22/30 as to reduce fall risk and improve dynamic gait safety with community ambulation.    Baseline 9/6: 16/30, 9/27: Deferred due to time; 11/2: 18/30, 1/4: 17/30    Time 8    Period Weeks    Status Partially Met    Target Date 08/08/21      PT LONG TERM GOAL #8   Title Patient will tolerate 15 seconds of single leg stance without loss of  balance to improve ability to get in and out of shower safely.    Baseline 9/6: L 9 seconds R 7 seconds, 9/27: R: 4-5 sec, limited by knee pain, L: 18 sec, 11/2: deferred;, 1/4: R: 5 sec, L: 20 sec; right is limited by knee pain;    Time 8    Period Weeks    Status Partially Met    Target Date 08/08/21                   Plan - 06/26/21 1427     Clinical Impression Statement Patient motivated and participated well within session. She was instructed in gentle strengthening as patient reports increased RLE knee pain this session. She continues to feel moderate difficulty with  2# ankle weight and requires min VCS for proper exercise technique. Progressed balance exercise utilizing compliant surface to challenge stance control. She does have difficulty with narrow base of support with increased ankle instability. Patient reports she has increased activities planned this week with church. Recommend patient take breaks and avoid overdoing it to reduce exertional fatigue. She would benefit from additional skilled PT intervention to improve strength, balance and mobility;    Personal Factors and Comorbidities Comorbidity 3+    Comorbidities HTN, MS, Arthritis, COPD    Examination-Activity Limitations Bend;Carry;Continence;Lift;Squat;Stairs;Stand    Examination-Participation Restrictions Community Activity;Yard Work    Stability/Clinical Decision Making Stable/Uncomplicated    Rehab Potential Poor    PT Frequency 2x / week    PT Duration 8 weeks    PT Treatment/Interventions ADLs/Self Care Home Management;Cryotherapy;Moist Heat;DME Instruction;Gait training;Stair training;Functional mobility training;Therapeutic activities;Therapeutic exercise;Balance training;Neuromuscular re-education;Patient/family education;Manual techniques;Passive range of motion    PT Next Visit Plan Limit AMB activity if BP conitnues to go to unsafe HTN (>841YSAY systolic); review HEP for home including balance prn    PT Home Exercise Plan pt unaware of location of her original handout, has not been compliant, warrants review (12/19/20)    Consulted and Agree with Plan of Care Patient             Patient will benefit from skilled therapeutic intervention in order to improve the following deficits and impairments:  Abnormal gait, Cardiopulmonary status limiting activity, Decreased activity tolerance, Decreased balance, Decreased coordination, Decreased endurance, Decreased mobility, Decreased range of motion, Decreased strength, Difficulty walking, Impaired flexibility, Obesity, Pain  Visit  Diagnosis: Other abnormalities of gait and mobility  Other lack of coordination  Difficulty in walking, not elsewhere classified  Muscle weakness (generalized)  Unsteadiness on feet     Problem List Patient Active Problem List   Diagnosis Date Noted   Atherosclerosis of abdominal aorta ([place]) 01/10/2021   Coronary artery disease involving native coronary artery of native heart 01/10/2021   OSA (obstructive sleep apnea) 04/19/2020   Statin intolerance 01/14/2020   S/P ORIF (open reduction internal fixation) fracture 02/20/2018   DJD (degenerative joint disease) of knee 01/02/2017   Advanced care planning/counseling discussion 01/02/2017   Thrombocytopenia ([place]) 10/30/2015   Multiple sclerosis ([place])    Essential hypertension    Hyperlipidemia    Benign neoplasm of brain ([place]) 04/30/2013   Cerebral meningioma ([place]) 04/29/2013   Malignant hypertension 04/29/2013   Obesity 04/15/2013    Derril Franek, PT, DPT 06/26/2021, 2:31 PM  [place] MAIN [facility] SERVICES [address] [place], [place], [address] Phone: [phone]   Fax:  [phone]  Name: SHERILYN WINDHORST MRN: [id number] Date of Birth: 01-22-1951

## 2021-07-03 ENCOUNTER — Ambulatory Visit: Payer: Medicare Other | Admitting: Physical Therapy

## 2021-07-05 ENCOUNTER — Ambulatory Visit: Payer: Medicare Other | Admitting: Physical Therapy

## 2021-07-10 ENCOUNTER — Ambulatory Visit: Payer: Medicare Other | Admitting: Physical Therapy

## 2021-07-12 ENCOUNTER — Other Ambulatory Visit: Payer: Self-pay

## 2021-07-12 ENCOUNTER — Ambulatory Visit: Payer: Medicare Other | Attending: Neurology | Admitting: Physical Therapy

## 2021-07-12 ENCOUNTER — Encounter: Payer: Self-pay | Admitting: Physical Therapy

## 2021-07-12 DIAGNOSIS — R269 Unspecified abnormalities of gait and mobility: Secondary | ICD-10-CM | POA: Diagnosis present

## 2021-07-12 DIAGNOSIS — R2681 Unsteadiness on feet: Secondary | ICD-10-CM | POA: Insufficient documentation

## 2021-07-12 DIAGNOSIS — R262 Difficulty in walking, not elsewhere classified: Secondary | ICD-10-CM | POA: Insufficient documentation

## 2021-07-12 DIAGNOSIS — M6281 Muscle weakness (generalized): Secondary | ICD-10-CM | POA: Insufficient documentation

## 2021-07-12 DIAGNOSIS — R278 Other lack of coordination: Secondary | ICD-10-CM | POA: Insufficient documentation

## 2021-07-12 DIAGNOSIS — R2689 Other abnormalities of gait and mobility: Secondary | ICD-10-CM | POA: Insufficient documentation

## 2021-07-12 NOTE — Therapy (Signed)
[place] [facility] [address] [place], [place], [address] Phone: [phone]   Fax:  [phone]  Physical Therapy Treatment  Patient Details  Name: Leslie Smith MRN: [id number] Date of Birth: 1950/09/23 Referring Provider (PT): Dr. Darlin Priestly   Encounter Date: 07/12/2021   PT End of Session - 07/12/21 0953     Visit Number 78    Number of Visits 28    Date for PT Re-Evaluation 08/08/21    Authorization Type Medicare A&B; Medicaid 2nd; VL review after 25 visits    Authorization Time Period 5/85/27-12/16/22; previous cert 2/35/3614-4/31/5400    Authorization - Visit Number 20    Authorization - Number of Visits 25    PT Start Time [phone]    PT Stop Time 1030    PT Time Calculation (min) 48 min    Equipment Utilized During Treatment Gait belt    Activity Tolerance Patient tolerated treatment well;Patient limited by fatigue    Behavior During Therapy [facility] for tasks assessed/performed             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/2014   Bladder incontinence    Cancer ([place]) 2014   brain tumor, resected   Cellulitis and abscess of leg 2019   2 episodes this year   Complication of anesthesia    DJD (degenerative joint disease) of knee 2018   Fall    Fatigue    History of blood transfusion    Hypertension    MS (multiple sclerosis) ([place])    Myelitis ([place])    Neuromuscular disorder ([place]) 2001   multiple sclerosis diagnosed by MRI   PONV (postoperative nausea and vomiting)    severe   Thrombocytopenia ([place])     Past Surgical History:  Procedure Laterality Date   ABDOMINAL HYSTERECTOMY  2011   required wound vac for 6 weeks. ovaries had grown attached to her back bone   BRAIN SURGERY  2015   tumor resection-denies seizures   BREAST SURGERY Left 2014   biopsy. negative   HARDWARE REMOVAL Left 05/05/2018   Procedure: HARDWARE REMOVAL- LEFT ANKLE;  Surgeon: Hessie Knows, MD;   Location: [facility];  Service: Orthopedics;  Laterality: Left;   I & D EXTREMITY Left 08/25/2015   Procedure: IRRIGATION AND DEBRIDEMENT THUMB;  Surgeon: Iran Planas, MD;  Location: [place];  Service: Orthopedics;  Laterality: Left;   ORIF ANKLE FRACTURE Left 02/20/2018   Procedure: OPEN REDUCTION INTERNAL FIXATION (ORIF) ANKLE FRACTURE;  Surgeon: Hessie Knows, MD;  Location: [facility];  Service: Orthopedics;  Laterality: Left;    There were no vitals filed for this visit.   Subjective Assessment - 07/12/21 0946     Subjective Patient reports recent travel to [place] due to recent death in the family. She reports increased fatigue. She reports top of right foot pain starting last night;    Pertinent History Patient diagnosed with MS in 2001. Past medical history significant for HTN, MS, DJD right knee, Hyperlipidemia, Thrombocytopenia, S/p ORIF Left ankle fracture.    Limitations Lifting;Standing;Walking;House hold activities    How long can you sit comfortably? No restrictions    How long can you stand comfortably? No baseline ADL/IADL activity limited by standing tolerance    How long can you walk comfortably? today is limited by her walk to mailbox; maybe 15 min at evaluation    Patient Stated Goals Walk better and regain some lost strength.  Currently in Pain? Yes    Pain Score 8     Pain Location Foot    Pain Orientation Right;Anterior    Pain Descriptors / Indicators Tender    Pain Type Acute pain    Pain Onset Yesterday    Pain Frequency Constant    Aggravating Factors  tender to touch/ pain with resisted extension    Pain Relieving Factors rest    Effect of Pain on Daily Activities decreased activity tolerance;    Multiple Pain Sites No                   TREATMENT: Patient reports increased right anterior foot discomfort PT identified increased tenderness to right anterior foot with mild bruising noted; Pt reports discomfort with resisted to extension; Likely, patient is  experiencing pain related to prolonged driving this week and possible extensor strain;   Ex: 2# ankle weight: Attempted, standing exercise, patient reports increase RLE knee and ankle pain, unable to tolerate;    Seated LAQ 2# x15 reps each LE with cues to DF foot for better stretch in hamstring; Patient reports increased fatigue with 2# ankle weight stating she felt like the weight was heavier than previous sessions;    Patient transitioned to hooklying with bolster under knee PT applied moist heat to RLE knee and ankle for better tolerance, concurrent with LE exercise -LLE SAQ x15 reps with good motor control and positioning 2# ankle weight;  -RLE SAQ x15 reps, no weight, with moist heat  LLE SLR 2# x10 reps RLE SLR hip flexion no weight x10 reps Patient reports moderate difficulty with lifting LLE due to increased resistance; weight not applied to RLE due to foot/ankle pain and discomfort;    NMR: Standing on airex pad: -Alternate toe taps to 6 inch step x15 reps with 2-1-0 rail assist -standing one foot on airex, one foot on 8 inch step: Unsupported standing x15 sec, mild instability when standing on RLE Unsupported standing trunk rotation x5 reps when standing on LLE, moderate difficulty requiring CGA Unsupported standing, head turns side/side x5 reps when standing on RLE, moderate difficulty, reports slight difficulty when standing on RLE;   Tolerated session fair. She does report increased fatigue with strengthening with ankle weight. She also reports increased RLE knee and foot pain with weight bearing. Exercise was adjusted to sitting and hooklying for better tolerance. Patient reports pain relief with moist heat.                                  PT Short Term Goals - 06/13/21 1109       PT SHORT TERM GOAL #1   Title Pt will be independent with HEP in order to improve strength and balance in order to decrease fall risk and improve function at home  and work.    Baseline 09/27/2020- Patient has no formal HEP in place, 5/19: independent; 7/12: not performing, handout is lost 8/17: depends on the day due to medication changes and pain 9/6: intermittent compliance, 9/27: intermittent compliant due MS flare up and knee pain; 11/2: hasn't been doing formal HEP, but has been active; has been traveling a lot; 12/6: mixed adherence due to holidays; 1/4: mixed adherenc with holidays and traveling;    Time 4    Period Weeks    Status Partially Met    Target Date 07/11/21  PT Long Term Goals - 06/13/21 1109       PT LONG TERM GOAL #1   Title Pt will improve FOTO score from 61  to target score of 64 to display perceived improvements in ability to complete ADL's and functional mobility activities.    Baseline 09/27/2020: FOTO=61, 10/26/20: 64%; 7/12: 58 8/17: 55% 9/6 63%, 9/27: 56%, 11/2: 62%, 1/4: 54%    Time 8    Period Weeks    Status Partially Met    Target Date 08/08/21      PT LONG TERM GOAL #2   Title Pt will improve DGI by at least 3 points in order to demonstrate clinically significant improvement in balance and decreased risk for falls.    Baseline 09/27/2020-DGI=18/24, 5/19: 18/24 7/12: not tested; 7/15: 22/24, 1/4: 19/24    Time 8    Period Weeks    Status On-going    Target Date 08/08/21      PT LONG TERM GOAL #3   Title Pt will improve ABC by at least 10 % in order to demonstrate clinically significant improvement in balance confidence.    Baseline 09/27/2020-68.1%, 5/19: 78% 7/12: not assessed; 7/15: 61.25    Time 8    Period Weeks    Status On-going    Target Date 08/08/21      PT LONG TERM GOAL #4   Title Pt will decrease 5TSTS by at least 5 seconds (initial =23 sec with no UE support) in order to demonstrate clinically significant improvement in LE strength.    Baseline 09/27/2020= 23 sec. with No UE support, 5/19: 15 sec; 7/12: not assessed; 7/15: 17.68 sec 8/17: 13.8 seconds, 9/27: deferred due to knee  pain;, 11/2: 14.5 sec without HHA, 1/4: deferred due to knee pain;    Time 8    Period Weeks    Status Achieved    Target Date 08/08/21      PT LONG TERM GOAL #5   Title Pt will decrease TUG to below 14 seconds/decrease in order to demonstrate decreased fall risk.    Baseline 09/27/2020- TUG score= 15.15 sec without UE support, 5/19: 8.89 sec; 12/19/20: not assessed; 7/15: 9.8 sec 9/6: 9.66 seconds, 11/2: 8.89 sec, 1/4: 8.12 sec;    Time 8    Period Weeks    Status Achieved    Target Date 08/08/21      PT LONG TERM GOAL #6   Title Patient will increase six minute walk test distance to >1000 for progression to community ambulator and improve gait ability with BP in functional level and moderate fatigue or less;    Baseline 4/26: 695 ft with one seated rest break, 5/19: 975 feet; 7/12: not safe to attempt, BP too high 8/17: 610 ft with multiple rest breaks, 9/27: 990 feet, high BP 11/2: 1120 with high bp, 12/6: not formally assessed due to achiness and fatigue, 1/4: deferred due to recent sickness/congestion    Time 8    Period Weeks    Status On-going    Target Date 08/08/21      PT LONG TERM GOAL #7   Title Patient will increase Functional Gait Assessment score to >22/30 as to reduce fall risk and improve dynamic gait safety with community ambulation.    Baseline 9/6: 16/30, 9/27: Deferred due to time; 11/2: 18/30, 1/4: 17/30    Time 8    Period Weeks    Status Partially Met    Target Date 08/08/21  PT LONG TERM GOAL #8   Title Patient will tolerate 15 seconds of single leg stance without loss of balance to improve ability to get in and out of shower safely.    Baseline 9/6: L 9 seconds R 7 seconds, 9/27: R: 4-5 sec, limited by knee pain, L: 18 sec, 11/2: deferred;, 1/4: R: 5 sec, L: 20 sec; right is limited by knee pain;    Time 8    Period Weeks    Status Partially Met    Target Date 08/08/21                   Plan - 07/12/21 1047     Clinical Impression  Statement Patient motivated and participated fair within session. She does reports increased RLE knee pain with weight bearing. her knee pain is likely exacerbated due to wet weather (arthritis). She reports new onset right anterior foot pain. PT inspected with slight bruising noted and pain with resisted toe extension. Patient was instructed in seated and hooklying exercise to reduce discomfort in weight bearing. PT applied moist heat for better tolerance with mobility and to help alleviate pain. Patient reports less discomfort at end of session. Reinforced HEP. She is going on a trip at the end of March; Plan to focus on LE strengthening and balance to prepare patient for travelling.    Personal Factors and Comorbidities Comorbidity 3+    Comorbidities HTN, MS, Arthritis, COPD    Examination-Activity Limitations Bend;Carry;Continence;Lift;Squat;Stairs;Stand    Examination-Participation Restrictions Community Activity;Yard Work    Stability/Clinical Decision Making Stable/Uncomplicated    Rehab Potential Poor    PT Frequency 2x / week    PT Duration 8 weeks    PT Treatment/Interventions ADLs/Self Care Home Management;Cryotherapy;Moist Heat;DME Instruction;Gait training;Stair training;Functional mobility training;Therapeutic activities;Therapeutic exercise;Balance training;Neuromuscular re-education;Patient/family education;Manual techniques;Passive range of motion    PT Next Visit Plan Limit AMB activity if BP conitnues to go to unsafe HTN (>233AQTM systolic); review HEP for home including balance prn    PT Home Exercise Plan pt unaware of location of her original handout, has not been compliant, warrants review (12/19/20)    Consulted and Agree with Plan of Care Patient             Patient will benefit from skilled therapeutic intervention in order to improve the following deficits and impairments:  Abnormal gait, Cardiopulmonary status limiting activity, Decreased activity tolerance, Decreased  balance, Decreased coordination, Decreased endurance, Decreased mobility, Decreased range of motion, Decreased strength, Difficulty walking, Impaired flexibility, Obesity, Pain  Visit Diagnosis: Other abnormalities of gait and mobility  Other lack of coordination  Difficulty in walking, not elsewhere classified  Muscle weakness (generalized)  Unsteadiness on feet  Abnormality of gait and mobility     Problem List Patient Active Problem List   Diagnosis Date Noted   Atherosclerosis of abdominal aorta ([place]) 01/10/2021   Coronary artery disease involving native coronary artery of native heart 01/10/2021   OSA (obstructive sleep apnea) 04/19/2020   Statin intolerance 01/14/2020   S/P ORIF (open reduction internal fixation) fracture 02/20/2018   DJD (degenerative joint disease) of knee 01/02/2017   Advanced care planning/counseling discussion 01/02/2017   Thrombocytopenia ([place]) 10/30/2015   Multiple sclerosis ([place])    Essential hypertension    Hyperlipidemia    Benign neoplasm of brain ([place]) 04/30/2013   Cerebral meningioma ([place]) 04/29/2013   Malignant hypertension 04/29/2013   Obesity 04/15/2013    Ashleen Demma, PT, DPT 07/12/2021, 10:59 AM  [facility]  [place], [place], [address] Phone: [phone]   Fax:  [phone]  Name: RAYSA BOSAK MRN: [id number] Date of Birth: May 25, 1951

## 2021-07-17 ENCOUNTER — Ambulatory Visit: Payer: Medicare Other | Admitting: Physical Therapy

## 2021-07-17 ENCOUNTER — Encounter: Payer: Self-pay | Admitting: Physical Therapy

## 2021-07-17 ENCOUNTER — Other Ambulatory Visit: Payer: Self-pay

## 2021-07-17 VITALS — BP 162/63 | HR 62

## 2021-07-17 DIAGNOSIS — R262 Difficulty in walking, not elsewhere classified: Secondary | ICD-10-CM

## 2021-07-17 DIAGNOSIS — R2681 Unsteadiness on feet: Secondary | ICD-10-CM

## 2021-07-17 DIAGNOSIS — R2689 Other abnormalities of gait and mobility: Secondary | ICD-10-CM

## 2021-07-17 DIAGNOSIS — R269 Unspecified abnormalities of gait and mobility: Secondary | ICD-10-CM

## 2021-07-17 DIAGNOSIS — R278 Other lack of coordination: Secondary | ICD-10-CM

## 2021-07-17 DIAGNOSIS — M6281 Muscle weakness (generalized): Secondary | ICD-10-CM

## 2021-07-17 NOTE — Therapy (Signed)
[place] MAIN [facility] SERVICES [address] [place], [place], [address] Phone: [phone]   Fax:  [phone]  Physical Therapy Treatment  Patient Details  Name: Leslie Smith MRN: [id number] Date of Birth: 1950-07-26 Referring Provider (PT): Dr. Darlin Priestly   Encounter Date: 07/17/2021   PT End of Session - 07/17/21 1109     Visit Number 32    Number of Visits 21    Date for PT Re-Evaluation 08/08/21    Authorization Type Medicare A&B; Medicaid 2nd; VL review after 25 visits    Authorization Time Period 0/16/01-0/9/32; previous cert 3/55/7322-0/25/4270    Authorization - Visit Number 19    Authorization - Number of Visits 25    PT Start Time 1103    PT Stop Time 1145    PT Time Calculation (min) 42 min    Equipment Utilized During Treatment Gait belt    Activity Tolerance Patient tolerated treatment well;Patient limited by fatigue    Behavior During Therapy WFL for tasks assessed/performed             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[place]) 09/15/2014   Bladder incontinence    Cancer ([place]) 2014   brain tumor, resected   Cellulitis and abscess of leg 2019   2 episodes this year   Complication of anesthesia    DJD (degenerative joint disease) of knee 2018   Fall    Fatigue    History of blood transfusion    Hypertension    MS (multiple sclerosis) ([place])    Myelitis ([place])    Neuromuscular disorder ([place]) 2001   multiple sclerosis diagnosed by MRI   PONV (postoperative nausea and vomiting)    severe   Thrombocytopenia ([place])     Past Surgical History:  Procedure Laterality Date   ABDOMINAL HYSTERECTOMY  2011   required wound vac for 6 weeks. ovaries had grown attached to her back bone   BRAIN SURGERY  2015   tumor resection-denies seizures   BREAST SURGERY Left 2014   biopsy. negative   HARDWARE REMOVAL Left 05/05/2018   Procedure: HARDWARE REMOVAL- LEFT ANKLE;  Surgeon: Hessie Knows, MD;   Location: [facility];  Service: Orthopedics;  Laterality: Left;   I & D EXTREMITY Left 08/25/2015   Procedure: IRRIGATION AND DEBRIDEMENT THUMB;  Surgeon: Iran Planas, MD;  Location: [place];  Service: Orthopedics;  Laterality: Left;   ORIF ANKLE FRACTURE Left 02/20/2018   Procedure: OPEN REDUCTION INTERNAL FIXATION (ORIF) ANKLE FRACTURE;  Surgeon: Hessie Knows, MD;  Location: [facility];  Service: Orthopedics;  Laterality: Left;    Vitals:   07/17/21 1107  BP: (!) 162/63  Pulse: 62     Subjective Assessment - 07/17/21 1107     Subjective Patient reports doing okay. She is still getting over her cold and recovering from recent traveling. She reports her knee is worn out. Its bothering her in the evenings.    Pertinent History Patient diagnosed with MS in 2001. Past medical history significant for HTN, MS, DJD right knee, Hyperlipidemia, Thrombocytopenia, S/p ORIF Left ankle fracture.    Limitations Lifting;Standing;Walking;House hold activities    How long can you sit comfortably? No restrictions    How long can you stand comfortably? No baseline ADL/IADL activity limited by standing tolerance    How long can you walk comfortably? today is limited by her walk to mailbox; maybe 15 min at evaluation    Patient Stated Goals  Walk better and regain some lost strength.    Currently in Pain? Yes    Pain Score 6     Pain Location Knee    Pain Orientation Right    Pain Descriptors / Indicators Aching;Sore    Pain Onset Yesterday    Pain Frequency Constant    Aggravating Factors  worse with prolonged activity/weight bearing and at end of day    Pain Relieving Factors rest    Effect of Pain on Daily Activities decreased activity tolerance;    Multiple Pain Sites No               TREATMENT: Patient reports her right foot pain has resolved. She said it hurt for 2 days and then disappeared;    Ex: 2# ankle weight: Standing hip abduction x15 reps each LE Standing hip extension x15 reps  each LE Required min VCs for proper positioning including to avoid trunk lean for better hip strengthening. Instructed patient in exercise with knee extended to reduce strain on knee;     Seated LAQ 2#  3 sec hold x10 reps each LE with cues to DF foot for better stretch in hamstring;   NMR: Standing on airex pad: Standing feet together:  Eyes open 20 sec hold, eyes closed 10 sec hold  Eyes open head turns side/side x5 reps  Eyes open, ball pass side/side x5 reps Progressed to tandem stance on airex pad: Eyes open 20 sec hold, eyes closed 10 sec hold  Eyes open head turns side/side x5 reps  Eyes open, ball pass side/side x5 reps  Weaving around cones #5 forward/backward x2 sets, patient had increased difficulty walking backwards  Tetris walking- forward/backward, side step around cones #5 x1 lap each direction, requiring min VCs for sequencing and to improve step length;  Side stepping in hallway tapping ball side/side on wall x5 laps each direction with cues for increased step length, reports minimal difficulty  Forward/backward walking x20 feet toss/catch ball x2 sets each, requiring CGA when backwards walking for safety;    Tolerated session fair. She does report increased fatigue with strengthening with ankle weight. She also reports increased RLE knee pain with prolonged standing on airex pad with 1 episode of knee popping; Patient required CGA with balance exercise, with occasional min A when standing tandem (narrow base of support) requiring cues for erect posture and upper trunk control for better balance;   Vitals assessed at end of session, HR 62, Spo2 98%  Reinforced HEP                         PT Education - 07/17/21 1109     Education Details exercise technique/positioning;    Person(s) Educated Patient    Methods Explanation;Verbal cues    Comprehension Verbalized understanding;Returned demonstration;Verbal cues required;Need further instruction               PT Short Term Goals - 06/13/21 1109       PT SHORT TERM GOAL #1   Title Pt will be independent with HEP in order to improve strength and balance in order to decrease fall risk and improve function at home and work.    Baseline 09/27/2020- Patient has no formal HEP in place, 5/19: independent; 7/12: not performing, handout is lost 8/17: depends on the day due to medication changes and pain 9/6: intermittent compliance, 9/27: intermittent compliant due MS flare up and knee pain; 11/2: hasn't been doing formal HEP, but  has been active; has been traveling a lot; 12/6: mixed adherence due to holidays; 1/4: mixed adherenc with holidays and traveling;    Time 4    Period Weeks    Status Partially Met    Target Date 07/11/21               PT Long Term Goals - 06/13/21 1109       PT LONG TERM GOAL #1   Title Pt will improve FOTO score from 61  to target score of 64 to display perceived improvements in ability to complete ADL's and functional mobility activities.    Baseline 09/27/2020: FOTO=61, 10/26/20: 64%; 7/12: 58 8/17: 55% 9/6 63%, 9/27: 56%, 11/2: 62%, 1/4: 54%    Time 8    Period Weeks    Status Partially Met    Target Date 08/08/21      PT LONG TERM GOAL #2   Title Pt will improve DGI by at least 3 points in order to demonstrate clinically significant improvement in balance and decreased risk for falls.    Baseline 09/27/2020-DGI=18/24, 5/19: 18/24 7/12: not tested; 7/15: 22/24, 1/4: 19/24    Time 8    Period Weeks    Status On-going    Target Date 08/08/21      PT LONG TERM GOAL #3   Title Pt will improve ABC by at least 10 % in order to demonstrate clinically significant improvement in balance confidence.    Baseline 09/27/2020-68.1%, 5/19: 78% 7/12: not assessed; 7/15: 61.25    Time 8    Period Weeks    Status On-going    Target Date 08/08/21      PT LONG TERM GOAL #4   Title Pt will decrease 5TSTS by at least 5 seconds (initial =23 sec with no UE  support) in order to demonstrate clinically significant improvement in LE strength.    Baseline 09/27/2020= 23 sec. with No UE support, 5/19: 15 sec; 7/12: not assessed; 7/15: 17.68 sec 8/17: 13.8 seconds, 9/27: deferred due to knee pain;, 11/2: 14.5 sec without HHA, 1/4: deferred due to knee pain;    Time 8    Period Weeks    Status Achieved    Target Date 08/08/21      PT LONG TERM GOAL #5   Title Pt will decrease TUG to below 14 seconds/decrease in order to demonstrate decreased fall risk.    Baseline 09/27/2020- TUG score= 15.15 sec without UE support, 5/19: 8.89 sec; 12/19/20: not assessed; 7/15: 9.8 sec 9/6: 9.66 seconds, 11/2: 8.89 sec, 1/4: 8.12 sec;    Time 8    Period Weeks    Status Achieved    Target Date 08/08/21      PT LONG TERM GOAL #6   Title Patient will increase six minute walk test distance to >1000 for progression to community ambulator and improve gait ability with BP in functional level and moderate fatigue or less;    Baseline 4/26: 695 ft with one seated rest break, 5/19: 975 feet; 7/12: not safe to attempt, BP too high 8/17: 610 ft with multiple rest breaks, 9/27: 990 feet, high BP 11/2: 1120 with high bp, 12/6: not formally assessed due to achiness and fatigue, 1/4: deferred due to recent sickness/congestion    Time 8    Period Weeks    Status On-going    Target Date 08/08/21      PT LONG TERM GOAL #7   Title Patient will increase Functional Gait Assessment score  to >22/30 as to reduce fall risk and improve dynamic gait safety with community ambulation.    Baseline 9/6: 16/30, 9/27: Deferred due to time; 11/2: 18/30, 1/4: 17/30    Time 8    Period Weeks    Status Partially Met    Target Date 08/08/21      PT LONG TERM GOAL #8   Title Patient will tolerate 15 seconds of single leg stance without loss of balance to improve ability to get in and out of shower safely.    Baseline 9/6: L 9 seconds R 7 seconds, 9/27: R: 4-5 sec, limited by knee pain, L: 18 sec,  11/2: deferred;, 1/4: R: 5 sec, L: 20 sec; right is limited by knee pain;    Time 8    Period Weeks    Status Partially Met    Target Date 08/08/21                   Plan - 07/17/21 1157     Clinical Impression Statement Patient motivated and participated well within session; She was instructed in advanced LE strengthening. Instructed patient in exercise with knee extended to reduce strain on LE knee. Patient also instructed in balance exercise. She had a harder time standing with narrow base of support on compliant surface. while standing in tandem, she experienced popping in her right knee which led to immediate discomfort. As a result, tandem stance discontinued. Patient instructed in dynamic balance negotiating obstacles. she did have increased difficulty with backward walking. Patient required CGA to min A throughout session. She reports mild fatigue at end of session. She would benefit from additional skilled PT Intervention to improve strength, balance and mobility;    Personal Factors and Comorbidities Comorbidity 3+    Comorbidities HTN, MS, Arthritis, COPD    Examination-Activity Limitations Bend;Carry;Continence;Lift;Squat;Stairs;Stand    Examination-Participation Restrictions Community Activity;Yard Work    Stability/Clinical Decision Making Stable/Uncomplicated    Rehab Potential Poor    PT Frequency 2x / week    PT Duration 8 weeks    PT Treatment/Interventions ADLs/Self Care Home Management;Cryotherapy;Moist Heat;DME Instruction;Gait training;Stair training;Functional mobility training;Therapeutic activities;Therapeutic exercise;Balance training;Neuromuscular re-education;Patient/family education;Manual techniques;Passive range of motion    PT Next Visit Plan Limit AMB activity if BP conitnues to go to unsafe HTN (>161WRUE systolic); review HEP for home including balance prn    PT Home Exercise Plan pt unaware of location of her original handout, has not been compliant,  warrants review (12/19/20)    Consulted and Agree with Plan of Care Patient             Patient will benefit from skilled therapeutic intervention in order to improve the following deficits and impairments:  Abnormal gait, Cardiopulmonary status limiting activity, Decreased activity tolerance, Decreased balance, Decreased coordination, Decreased endurance, Decreased mobility, Decreased range of motion, Decreased strength, Difficulty walking, Impaired flexibility, Obesity, Pain  Visit Diagnosis: Other abnormalities of gait and mobility  Other lack of coordination  Difficulty in walking, not elsewhere classified  Muscle weakness (generalized)  Unsteadiness on feet  Abnormality of gait and mobility     Problem List Patient Active Problem List   Diagnosis Date Noted   Atherosclerosis of abdominal aorta ([place]) 01/10/2021   Coronary artery disease involving native coronary artery of native heart 01/10/2021   OSA (obstructive sleep apnea) 04/19/2020   Statin intolerance 01/14/2020   S/P ORIF (open reduction internal fixation) fracture 02/20/2018   DJD (degenerative joint disease) of knee 01/02/2017   Advanced care  planning/counseling discussion 01/02/2017   Thrombocytopenia ([place]) 10/30/2015   Multiple sclerosis ([place])    Essential hypertension    Hyperlipidemia    Benign neoplasm of brain ([place]) 04/30/2013   Cerebral meningioma ([place]) 04/29/2013   Malignant hypertension 04/29/2013   Obesity 04/15/2013    Infant Doane, PT, DPT 07/17/2021, 2:16 PM  [place] MAIN [facility] SERVICES [address] [place], [place], [address] Phone: [phone]   Fax:  [phone]  Name: YAZAIRA SPEAS MRN: [id number] Date of Birth: 30-Jul-1950

## 2021-07-19 ENCOUNTER — Ambulatory Visit: Payer: Medicare Other | Admitting: Physical Therapy

## 2021-07-19 ENCOUNTER — Other Ambulatory Visit: Payer: Self-pay

## 2021-07-19 ENCOUNTER — Encounter: Payer: Self-pay | Admitting: Physical Therapy

## 2021-07-19 DIAGNOSIS — R2689 Other abnormalities of gait and mobility: Secondary | ICD-10-CM | POA: Diagnosis not present

## 2021-07-19 DIAGNOSIS — R262 Difficulty in walking, not elsewhere classified: Secondary | ICD-10-CM

## 2021-07-19 DIAGNOSIS — M6281 Muscle weakness (generalized): Secondary | ICD-10-CM

## 2021-07-19 DIAGNOSIS — R278 Other lack of coordination: Secondary | ICD-10-CM

## 2021-07-19 DIAGNOSIS — R2681 Unsteadiness on feet: Secondary | ICD-10-CM

## 2021-07-19 NOTE — Therapy (Signed)
[place] MAIN [facility] SERVICES [address] [place], [place], [address] Phone: [phone]   Fax:  [phone]  Physical Therapy Treatment  Patient Details  Name: Leslie Smith MRN: [id number] Date of Birth: 1951/02/09 Referring Provider (PT): Dr. Darlin Priestly   Encounter Date: 07/19/2021   PT End of Session - 07/19/21 1158     Visit Number 81    Number of Visits 37    Date for PT Re-Evaluation 08/08/21    Authorization Type Medicare A&B; Medicaid 2nd; VL review after 25 visits    Authorization Time Period 5/63/87-10/13/41; previous cert 09/06/5186-09/23/6061    Authorization - Visit Number 84    Authorization - Number of Visits 25    PT Start Time 0160    PT Stop Time 1230    PT Time Calculation (min) 41 min    Equipment Utilized During Treatment Gait belt    Activity Tolerance Patient tolerated treatment well;Patient limited by fatigue    Behavior During Therapy WFL for tasks assessed/performed             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/2014   Bladder incontinence    Cancer ([place]) 2014   brain tumor, resected   Cellulitis and abscess of leg 2019   2 episodes this year   Complication of anesthesia    DJD (degenerative joint disease) of knee 2018   Fall    Fatigue    History of blood transfusion    Hypertension    MS (multiple sclerosis) ([place])    Myelitis ([place])    Neuromuscular disorder ([place]) 2001   multiple sclerosis diagnosed by MRI   PONV (postoperative nausea and vomiting)    severe   Thrombocytopenia ([place])     Past Surgical History:  Procedure Laterality Date   ABDOMINAL HYSTERECTOMY  2011   required wound vac for 6 weeks. ovaries had grown attached to her back bone   BRAIN SURGERY  2015   tumor resection-denies seizures   BREAST SURGERY Left 2014   biopsy. negative   HARDWARE REMOVAL Left 05/05/2018   Procedure: HARDWARE REMOVAL- LEFT ANKLE;  Surgeon: Hessie Knows, MD;   Location: [facility];  Service: Orthopedics;  Laterality: Left;   I & D EXTREMITY Left 08/25/2015   Procedure: IRRIGATION AND DEBRIDEMENT THUMB;  Surgeon: Iran Planas, MD;  Location: [place];  Service: Orthopedics;  Laterality: Left;   ORIF ANKLE FRACTURE Left 02/20/2018   Procedure: OPEN REDUCTION INTERNAL FIXATION (ORIF) ANKLE FRACTURE;  Surgeon: Hessie Knows, MD;  Location: [facility];  Service: Orthopedics;  Laterality: Left;    There were no vitals filed for this visit.   Subjective Assessment - 07/19/21 1155     Subjective Patient reports being busy yesterday and baked a cake for a friend's funeral.    Pertinent History Patient diagnosed with MS in 2001. Past medical history significant for HTN, MS, DJD right knee, Hyperlipidemia, Thrombocytopenia, S/p ORIF Left ankle fracture.    Limitations Lifting;Standing;Walking;House hold activities    How long can you sit comfortably? No restrictions    How long can you stand comfortably? No baseline ADL/IADL activity limited by standing tolerance    How long can you walk comfortably? today is limited by her walk to mailbox; maybe 15 min at evaluation    Patient Stated Goals Walk better and regain some lost strength.    Currently in Pain? Yes    Pain Score 4  Pain Location Knee    Pain Orientation Right    Pain Descriptors / Indicators Aching;Sore    Pain Type Chronic pain    Pain Onset More than a month ago    Pain Frequency Intermittent    Aggravating Factors  worse with prolonged activity/weight bearing;    Pain Relieving Factors rest    Effect of Pain on Daily Activities decreased activity tolerance;    Multiple Pain Sites No                TREATMENT: Patient presents to therapy with support dog.  Ex: 2# ankle weight: Side stepping x10 feet x2 laps without rail assist to challenge LE strengthening/balance;  Standing heel raises x15 reps; with fingertip assistance;  Standing hip extension x15 reps each LE, fingertip assist;   Required min VCs for proper positioning including to avoid trunk lean for better hip strengthening. Instructed patient in exercise with knee extended to reduce strain on knee;      NMR: Standing on airex pad: Forward/backward airex to airex x10 reps unsupported, CGA for safety with mild instability noted;  Side stepping side/side airex to airex x5 reps unsupported requiring intermittent min A for safety;   Progressed to tandem stance on airex pad:             Eyes open head turns side/side x5 reps             Eyes open, cone pass side/side x5 reps  Patient required CGA for safety with increased instability noted when passing cone/reaching outside base of support;  Airex beam: -side stepping unsupported x3 laps with intermittent finger tip hold, CGA for safety -feet apart, balloon taps x2-3 min with intermittent rail assist; -standing with feet apart, reaching up in diagonal toward target to challenge lateral weight shift x10 reps each direction, close supervision with good balance control;  Weaving around cones #5 x4 laps with support dog; patient/dog occasional knock over cones but patient does not experience loss of balance; She reports that dog has occasionally tripped her up but she has been able to keep her balance well without falling;   Tolerated session fair. She does report increased fatigue with strengthening with ankle weight. She also reports increased RLE knee pain with prolonged standing  Patient required CGA with balance exercise, with occasional min A when standing tandem (narrow base of support) requiring cues for erect posture and upper trunk control for better balance;     Reinforced HEP                          PT Education - 07/19/21 1224     Education Details exercise technique/positioning;    Person(s) Educated Patient    Methods Explanation;Verbal cues    Comprehension Verbalized understanding;Returned demonstration;Verbal cues  required;Need further instruction              PT Short Term Goals - 06/13/21 1109       PT SHORT TERM GOAL #1   Title Pt will be independent with HEP in order to improve strength and balance in order to decrease fall risk and improve function at home and work.    Baseline 09/27/2020- Patient has no formal HEP in place, 5/19: independent; 7/12: not performing, handout is lost 8/17: depends on the day due to medication changes and pain 9/6: intermittent compliance, 9/27: intermittent compliant due MS flare up and knee pain; 11/2: hasn't been doing formal HEP, but has  been active; has been traveling a lot; 12/6: mixed adherence due to holidays; 1/4: mixed adherenc with holidays and traveling;    Time 4    Period Weeks    Status Partially Met    Target Date 07/11/21               PT Long Term Goals - 06/13/21 1109       PT LONG TERM GOAL #1   Title Pt will improve FOTO score from 61  to target score of 64 to display perceived improvements in ability to complete ADL's and functional mobility activities.    Baseline 09/27/2020: FOTO=61, 10/26/20: 64%; 7/12: 58 8/17: 55% 9/6 63%, 9/27: 56%, 11/2: 62%, 1/4: 54%    Time 8    Period Weeks    Status Partially Met    Target Date 08/08/21      PT LONG TERM GOAL #2   Title Pt will improve DGI by at least 3 points in order to demonstrate clinically significant improvement in balance and decreased risk for falls.    Baseline 09/27/2020-DGI=18/24, 5/19: 18/24 7/12: not tested; 7/15: 22/24, 1/4: 19/24    Time 8    Period Weeks    Status On-going    Target Date 08/08/21      PT LONG TERM GOAL #3   Title Pt will improve ABC by at least 10 % in order to demonstrate clinically significant improvement in balance confidence.    Baseline 09/27/2020-68.1%, 5/19: 78% 7/12: not assessed; 7/15: 61.25    Time 8    Period Weeks    Status On-going    Target Date 08/08/21      PT LONG TERM GOAL #4   Title Pt will decrease 5TSTS by at least 5 seconds  (initial =23 sec with no UE support) in order to demonstrate clinically significant improvement in LE strength.    Baseline 09/27/2020= 23 sec. with No UE support, 5/19: 15 sec; 7/12: not assessed; 7/15: 17.68 sec 8/17: 13.8 seconds, 9/27: deferred due to knee pain;, 11/2: 14.5 sec without HHA, 1/4: deferred due to knee pain;    Time 8    Period Weeks    Status Achieved    Target Date 08/08/21      PT LONG TERM GOAL #5   Title Pt will decrease TUG to below 14 seconds/decrease in order to demonstrate decreased fall risk.    Baseline 09/27/2020- TUG score= 15.15 sec without UE support, 5/19: 8.89 sec; 12/19/20: not assessed; 7/15: 9.8 sec 9/6: 9.66 seconds, 11/2: 8.89 sec, 1/4: 8.12 sec;    Time 8    Period Weeks    Status Achieved    Target Date 08/08/21      PT LONG TERM GOAL #6   Title Patient will increase six minute walk test distance to >1000 for progression to community ambulator and improve gait ability with BP in functional level and moderate fatigue or less;    Baseline 4/26: 695 ft with one seated rest break, 5/19: 975 feet; 7/12: not safe to attempt, BP too high 8/17: 610 ft with multiple rest breaks, 9/27: 990 feet, high BP 11/2: 1120 with high bp, 12/6: not formally assessed due to achiness and fatigue, 1/4: deferred due to recent sickness/congestion    Time 8    Period Weeks    Status On-going    Target Date 08/08/21      PT LONG TERM GOAL #7   Title Patient will increase Functional Gait Assessment score to >  22/30 as to reduce fall risk and improve dynamic gait safety with community ambulation.    Baseline 9/6: 16/30, 9/27: Deferred due to time; 11/2: 18/30, 1/4: 17/30    Time 8    Period Weeks    Status Partially Met    Target Date 08/08/21      PT LONG TERM GOAL #8   Title Patient will tolerate 15 seconds of single leg stance without loss of balance to improve ability to get in and out of shower safely.    Baseline 9/6: L 9 seconds R 7 seconds, 9/27: R: 4-5 sec, limited  by knee pain, L: 18 sec, 11/2: deferred;, 1/4: R: 5 sec, L: 20 sec; right is limited by knee pain;    Time 8    Period Weeks    Status Partially Met    Target Date 08/08/21                   Plan - 07/19/21 1546     Clinical Impression Statement Patient motivated and participated fair within session. Utilized ankle weight to reduce discomfort and improve strength in LE. she was instructed in straight leg exercise to reduce strain on knee. Patient instructed in advanced balance exercise. utilized compliant surface to challenge stance control. Patient required CGA to min A for balance. She had a harder time with narrow base of support, especially when reaching or turning. Patient would benefit from additional skilled PT Intervention to improve strength, balance and gait safety;    Personal Factors and Comorbidities Comorbidity 3+    Comorbidities HTN, MS, Arthritis, COPD    Examination-Activity Limitations Bend;Carry;Continence;Lift;Squat;Stairs;Stand    Examination-Participation Restrictions Community Activity;Yard Work    Stability/Clinical Decision Making Stable/Uncomplicated    Rehab Potential Poor    PT Frequency 2x / week    PT Duration 8 weeks    PT Treatment/Interventions ADLs/Self Care Home Management;Cryotherapy;Moist Heat;DME Instruction;Gait training;Stair training;Functional mobility training;Therapeutic activities;Therapeutic exercise;Balance training;Neuromuscular re-education;Patient/family education;Manual techniques;Passive range of motion    PT Next Visit Plan Limit AMB activity if BP conitnues to go to unsafe HTN (>852DPOE systolic); review HEP for home including balance prn    PT Home Exercise Plan pt unaware of location of her original handout, has not been compliant, warrants review (12/19/20)    Consulted and Agree with Plan of Care Patient             Patient will benefit from skilled therapeutic intervention in order to improve the following deficits and  impairments:  Abnormal gait, Cardiopulmonary status limiting activity, Decreased activity tolerance, Decreased balance, Decreased coordination, Decreased endurance, Decreased mobility, Decreased range of motion, Decreased strength, Difficulty walking, Impaired flexibility, Obesity, Pain  Visit Diagnosis: Other abnormalities of gait and mobility  Other lack of coordination  Difficulty in walking, not elsewhere classified  Muscle weakness (generalized)  Unsteadiness on feet     Problem List Patient Active Problem List   Diagnosis Date Noted   Atherosclerosis of abdominal aorta ([place]) 01/10/2021   Coronary artery disease involving native coronary artery of native heart 01/10/2021   OSA (obstructive sleep apnea) 04/19/2020   Statin intolerance 01/14/2020   S/P ORIF (open reduction internal fixation) fracture 02/20/2018   DJD (degenerative joint disease) of knee 01/02/2017   Advanced care planning/counseling discussion 01/02/2017   Thrombocytopenia ([place]) 10/30/2015   Multiple sclerosis ([place])    Essential hypertension    Hyperlipidemia    Benign neoplasm of brain ([place]) 04/30/2013   Cerebral meningioma ([place]) 04/29/2013   Malignant  hypertension 04/29/2013   Obesity 04/15/2013    Clevester Helzer, PT, DPT 07/19/2021, 3:48 PM  [place] MAIN [facility] SERVICES [address] [place], [place], [address] Phone: [phone]   Fax:  [phone]  Name: Leslie Smith MRN: [id number] Date of Birth: 06-Jun-1951

## 2021-07-24 ENCOUNTER — Encounter: Payer: Self-pay | Admitting: Physical Therapy

## 2021-07-24 ENCOUNTER — Other Ambulatory Visit: Payer: Self-pay

## 2021-07-24 ENCOUNTER — Ambulatory Visit: Payer: Medicare Other | Admitting: Physical Therapy

## 2021-07-24 VITALS — BP 153/59 | HR 79

## 2021-07-24 DIAGNOSIS — R278 Other lack of coordination: Secondary | ICD-10-CM

## 2021-07-24 DIAGNOSIS — M6281 Muscle weakness (generalized): Secondary | ICD-10-CM

## 2021-07-24 DIAGNOSIS — R2681 Unsteadiness on feet: Secondary | ICD-10-CM

## 2021-07-24 DIAGNOSIS — R262 Difficulty in walking, not elsewhere classified: Secondary | ICD-10-CM

## 2021-07-24 DIAGNOSIS — R2689 Other abnormalities of gait and mobility: Secondary | ICD-10-CM

## 2021-07-24 NOTE — Therapy (Signed)
[place] MAIN [facility] SERVICES [address] [place], [place], [address] Phone: [phone]   Fax:  [phone]  Physical Therapy Treatment  Patient Details  Name: Leslie Smith MRN: [id number] Date of Birth: 1951-02-24 Referring Provider (PT): Dr. Darlin Priestly   Encounter Date: 07/24/2021   PT End of Session - 07/24/21 1111     Visit Number 56    Number of Visits 32    Date for PT Re-Evaluation 08/08/21    Authorization Type Medicare A&B; Medicaid 2nd; VL review after 25 visits    Authorization Time Period 1/65/53-12/11/80; previous cert 12/14/8673-4/49/2010    Authorization - Visit Number 42    Authorization - Number of Visits 25    PT Start Time 0712    PT Stop Time 1145    PT Time Calculation (min) 43 min    Equipment Utilized During Treatment Gait belt    Activity Tolerance Patient tolerated treatment well;Patient limited by fatigue    Behavior During Therapy WFL for tasks assessed/performed             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[place]) 09/15/2014   Bladder incontinence    Cancer ([place]) 2014   brain tumor, resected   Cellulitis and abscess of leg 2019   2 episodes this year   Complication of anesthesia    DJD (degenerative joint disease) of knee 2018   Fall    Fatigue    History of blood transfusion    Hypertension    MS (multiple sclerosis) ([place])    Myelitis ([place])    Neuromuscular disorder ([place]) 2001   multiple sclerosis diagnosed by MRI   PONV (postoperative nausea and vomiting)    severe   Thrombocytopenia ([place])     Past Surgical History:  Procedure Laterality Date   ABDOMINAL HYSTERECTOMY  2011   required wound vac for 6 weeks. ovaries had grown attached to her back bone   BRAIN SURGERY  2015   tumor resection-denies seizures   BREAST SURGERY Left 2014   biopsy. negative   HARDWARE REMOVAL Left 05/05/2018   Procedure: HARDWARE REMOVAL- LEFT ANKLE;  Surgeon: Hessie Knows, MD;   Location: [facility];  Service: Orthopedics;  Laterality: Left;   I & D EXTREMITY Left 08/25/2015   Procedure: IRRIGATION AND DEBRIDEMENT THUMB;  Surgeon: Iran Planas, MD;  Location: [place];  Service: Orthopedics;  Laterality: Left;   ORIF ANKLE FRACTURE Left 02/20/2018   Procedure: OPEN REDUCTION INTERNAL FIXATION (ORIF) ANKLE FRACTURE;  Surgeon: Hessie Knows, MD;  Location: [facility];  Service: Orthopedics;  Laterality: Left;    Vitals:   07/24/21 1110  BP: (!) 153/59  Pulse: 79     Subjective Assessment - 07/24/21 1110     Subjective Patient reports increased soreness all over. She reports increased numbness in feet and feeling some tightness in chest. She states, "I am just falling apart."    Pertinent History Patient diagnosed with MS in 2001. Past medical history significant for HTN, MS, DJD right knee, Hyperlipidemia, Thrombocytopenia, S/p ORIF Left ankle fracture.    Limitations Lifting;Standing;Walking;House hold activities    How long can you sit comfortably? No restrictions    How long can you stand comfortably? No baseline ADL/IADL activity limited by standing tolerance    How long can you walk comfortably? today is limited by her walk to mailbox; maybe 15 min at evaluation    Patient Stated Goals Walk better and regain  some lost strength.    Currently in Pain? Yes    Pain Score 8     Pain Location Knee    Pain Orientation Right    Pain Descriptors / Indicators Aching;Sore    Pain Type Chronic pain    Pain Onset More than a month ago    Pain Frequency Intermittent    Aggravating Factors  worse with prolonged activity/weight bearing    Pain Relieving Factors rest    Effect of Pain on Daily Activities decreased activity tolerance;    Multiple Pain Sites No                  TREATMENT:   Ex: 2# ankle weight: LAQ x15 reps x2 sets each LE with ankle DF to facilitate better ROM Patient denies any discomfort Educated patient how doing LAQ prior to standing after  prolonged sitting can help alleviate knee discomfort/stiffness;      NMR: Standing on airex pad: Standing feet shoulder width apart:  -eyes open 30 sec hold, eyes closed 30 sec hold, no sway  Head turns side/side x5 reps  Head turns up/down x5 reps, no sway  Ball pass side/side x10 reps each direction;   Progressed to tandem stance on airex pad:  Eyes open open 15 sec hold, eyes closed 10 sec hold with minimal sway             Eyes open head turns side/side x5 reps  Patient required CGA for safety with increased instability noted when turning head and narrow base of support;  Feet close together:  Tapping object on bottom step, reaching with UE x5 reps, minimal instability/difficulty;    Gait in gym forward/backward walking x40 feet x2 sets unsupported with head turns side/side; required supervision and cues to increase step length for safety;    Tolerated session fair. She does report increased right knee pain requiring exercise modification to reduce strain on knee. She reports not feeling her best today with numbness/coldness in feet.     Reinforced HEP                        PT Education - 07/24/21 1111     Education Details exercise technique/positioning;    Person(s) Educated Patient    Methods Explanation;Verbal cues    Comprehension Verbalized understanding;Returned demonstration;Verbal cues required;Need further instruction              PT Short Term Goals - 06/13/21 1109       PT SHORT TERM GOAL #1   Title Pt will be independent with HEP in order to improve strength and balance in order to decrease fall risk and improve function at home and work.    Baseline 09/27/2020- Patient has no formal HEP in place, 5/19: independent; 7/12: not performing, handout is lost 8/17: depends on the day due to medication changes and pain 9/6: intermittent compliance, 9/27: intermittent compliant due MS flare up and knee pain; 11/2: hasn't been doing formal HEP,  but has been active; has been traveling a lot; 12/6: mixed adherence due to holidays; 1/4: mixed adherenc with holidays and traveling;    Time 4    Period Weeks    Status Partially Met    Target Date 07/11/21               PT Long Term Goals - 06/13/21 1109       PT LONG TERM GOAL #1   Title Pt will  improve FOTO score from 61  to target score of 64 to display perceived improvements in ability to complete ADL's and functional mobility activities.    Baseline 09/27/2020: FOTO=61, 10/26/20: 64%; 7/12: 58 8/17: 55% 9/6 63%, 9/27: 56%, 11/2: 62%, 1/4: 54%    Time 8    Period Weeks    Status Partially Met    Target Date 08/08/21      PT LONG TERM GOAL #2   Title Pt will improve DGI by at least 3 points in order to demonstrate clinically significant improvement in balance and decreased risk for falls.    Baseline 09/27/2020-DGI=18/24, 5/19: 18/24 7/12: not tested; 7/15: 22/24, 1/4: 19/24    Time 8    Period Weeks    Status On-going    Target Date 08/08/21      PT LONG TERM GOAL #3   Title Pt will improve ABC by at least 10 % in order to demonstrate clinically significant improvement in balance confidence.    Baseline 09/27/2020-68.1%, 5/19: 78% 7/12: not assessed; 7/15: 61.25    Time 8    Period Weeks    Status On-going    Target Date 08/08/21      PT LONG TERM GOAL #4   Title Pt will decrease 5TSTS by at least 5 seconds (initial =23 sec with no UE support) in order to demonstrate clinically significant improvement in LE strength.    Baseline 09/27/2020= 23 sec. with No UE support, 5/19: 15 sec; 7/12: not assessed; 7/15: 17.68 sec 8/17: 13.8 seconds, 9/27: deferred due to knee pain;, 11/2: 14.5 sec without HHA, 1/4: deferred due to knee pain;    Time 8    Period Weeks    Status Achieved    Target Date 08/08/21      PT LONG TERM GOAL #5   Title Pt will decrease TUG to below 14 seconds/decrease in order to demonstrate decreased fall risk.    Baseline 09/27/2020- TUG score= 15.15  sec without UE support, 5/19: 8.89 sec; 12/19/20: not assessed; 7/15: 9.8 sec 9/6: 9.66 seconds, 11/2: 8.89 sec, 1/4: 8.12 sec;    Time 8    Period Weeks    Status Achieved    Target Date 08/08/21      PT LONG TERM GOAL #6   Title Patient will increase six minute walk test distance to >1000 for progression to community ambulator and improve gait ability with BP in functional level and moderate fatigue or less;    Baseline 4/26: 695 ft with one seated rest break, 5/19: 975 feet; 7/12: not safe to attempt, BP too high 8/17: 610 ft with multiple rest breaks, 9/27: 990 feet, high BP 11/2: 1120 with high bp, 12/6: not formally assessed due to achiness and fatigue, 1/4: deferred due to recent sickness/congestion    Time 8    Period Weeks    Status On-going    Target Date 08/08/21      PT LONG TERM GOAL #7   Title Patient will increase Functional Gait Assessment score to >22/30 as to reduce fall risk and improve dynamic gait safety with community ambulation.    Baseline 9/6: 16/30, 9/27: Deferred due to time; 11/2: 18/30, 1/4: 17/30    Time 8    Period Weeks    Status Partially Met    Target Date 08/08/21      PT LONG TERM GOAL #8   Title Patient will tolerate 15 seconds of single leg stance without loss of balance to improve  ability to get in and out of shower safely.    Baseline 9/6: L 9 seconds R 7 seconds, 9/27: R: 4-5 sec, limited by knee pain, L: 18 sec, 11/2: deferred;, 1/4: R: 5 sec, L: 20 sec; right is limited by knee pain;    Time 8    Period Weeks    Status Partially Met    Target Date 08/08/21                   Plan - 07/24/21 1257     Clinical Impression Statement Patient motivated and participated fair within session. She reports increased RLE knee pain which is exacerbated with prolonged weight bearing. Instructed patient in balance tasks in modified position to reduce strain on knee. She does have increased instability with narrow base of support. Patient instructed  in walking in hallway in various directions. She reports less discomfort with gentle walking. Educated patient how gentle ROM/walking could help reduce arthritic pain. Recommend patient do LAQ prior to standing after sitting for a period of time. She would benefit from additional skilled PT Intervention to improve strength, balance and gait safety;    Personal Factors and Comorbidities Comorbidity 3+    Comorbidities HTN, MS, Arthritis, COPD    Examination-Activity Limitations Bend;Carry;Continence;Lift;Squat;Stairs;Stand    Examination-Participation Restrictions Community Activity;Yard Work    Stability/Clinical Decision Making Stable/Uncomplicated    Rehab Potential Poor    PT Frequency 2x / week    PT Duration 8 weeks    PT Treatment/Interventions ADLs/Self Care Home Management;Cryotherapy;Moist Heat;DME Instruction;Gait training;Stair training;Functional mobility training;Therapeutic activities;Therapeutic exercise;Balance training;Neuromuscular re-education;Patient/family education;Manual techniques;Passive range of motion    PT Next Visit Plan Limit AMB activity if BP conitnues to go to unsafe HTN (>315VVOH systolic); review HEP for home including balance prn    PT Home Exercise Plan pt unaware of location of her original handout, has not been compliant, warrants review (12/19/20)    Consulted and Agree with Plan of Care Patient             Patient will benefit from skilled therapeutic intervention in order to improve the following deficits and impairments:  Abnormal gait, Cardiopulmonary status limiting activity, Decreased activity tolerance, Decreased balance, Decreased coordination, Decreased endurance, Decreased mobility, Decreased range of motion, Decreased strength, Difficulty walking, Impaired flexibility, Obesity, Pain  Visit Diagnosis: Other abnormalities of gait and mobility  Other lack of coordination  Difficulty in walking, not elsewhere classified  Muscle weakness  (generalized)  Unsteadiness on feet     Problem List Patient Active Problem List   Diagnosis Date Noted   Atherosclerosis of abdominal aorta ([place]) 01/10/2021   Coronary artery disease involving native coronary artery of native heart 01/10/2021   OSA (obstructive sleep apnea) 04/19/2020   Statin intolerance 01/14/2020   S/P ORIF (open reduction internal fixation) fracture 02/20/2018   DJD (degenerative joint disease) of knee 01/02/2017   Advanced care planning/counseling discussion 01/02/2017   Thrombocytopenia ([place]) 10/30/2015   Multiple sclerosis ([place])    Essential hypertension    Hyperlipidemia    Benign neoplasm of brain ([place]) 04/30/2013   Cerebral meningioma ([place]) 04/29/2013   Malignant hypertension 04/29/2013   Obesity 04/15/2013    Baron Parmelee, PT, DPT 07/24/2021, 2:14 PM  [place] MAIN [facility] SERVICES [address] [place], [place], [address] Phone: [phone]   Fax:  [phone]  Name: Leslie Smith MRN: [id number] Date of Birth: 1950-08-10

## 2021-07-25 ENCOUNTER — Other Ambulatory Visit: Payer: Self-pay | Admitting: *Deleted

## 2021-07-25 DIAGNOSIS — E538 Deficiency of other specified B group vitamins: Secondary | ICD-10-CM

## 2021-07-25 DIAGNOSIS — D693 Immune thrombocytopenic purpura: Secondary | ICD-10-CM

## 2021-07-26 ENCOUNTER — Encounter: Payer: Self-pay | Admitting: Physical Therapy

## 2021-07-26 ENCOUNTER — Other Ambulatory Visit: Payer: Self-pay

## 2021-07-26 ENCOUNTER — Ambulatory Visit: Payer: Medicare Other | Admitting: Physical Therapy

## 2021-07-26 DIAGNOSIS — R2681 Unsteadiness on feet: Secondary | ICD-10-CM

## 2021-07-26 DIAGNOSIS — R2689 Other abnormalities of gait and mobility: Secondary | ICD-10-CM | POA: Diagnosis not present

## 2021-07-26 DIAGNOSIS — R262 Difficulty in walking, not elsewhere classified: Secondary | ICD-10-CM

## 2021-07-26 DIAGNOSIS — R269 Unspecified abnormalities of gait and mobility: Secondary | ICD-10-CM

## 2021-07-26 DIAGNOSIS — M6281 Muscle weakness (generalized): Secondary | ICD-10-CM

## 2021-07-26 DIAGNOSIS — R278 Other lack of coordination: Secondary | ICD-10-CM

## 2021-07-26 NOTE — Therapy (Signed)
[place] SERVICES [address] [place], [place], [address] Phone: [phone]   Fax:  [phone]  Physical Therapy Treatment  Patient Details  Name: Leslie Smith MRN: [id number] Date of Birth: 1950-07-23 Referring Provider (PT): Dr. Darlin Priestly   Encounter Date: 07/26/2021   PT End of Session - 07/26/21 1333     Visit Number 72    Number of Visits 93    Date for PT Re-Evaluation 08/08/21    Authorization Type Medicare A&B; Medicaid 2nd; VL review after 25 visits    Authorization Time Period 5/44/92-0/1/00; previous cert 12/19/1973-8/83/2549    Authorization - Visit Number 20    Authorization - Number of Visits 25    PT Start Time [id number]    PT Stop Time 1018    PT Time Calculation (min) 40 min    Equipment Utilized During Treatment Gait belt    Activity Tolerance Patient tolerated treatment well;Patient limited by fatigue    Behavior During Therapy [facility] for tasks assessed/performed             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[place]) 09/15/2014   Bladder incontinence    Cancer ([place]) 2014   brain tumor, resected   Cellulitis and abscess of leg 2019   2 episodes this year   Complication of anesthesia    DJD (degenerative joint disease) of knee 2018   Fall    Fatigue    History of blood transfusion    Hypertension    MS (multiple sclerosis) ([place])    Myelitis ([place])    Neuromuscular disorder ([place]) 2001   multiple sclerosis diagnosed by MRI   PONV (postoperative nausea and vomiting)    severe   Thrombocytopenia ([place])     Past Surgical History:  Procedure Laterality Date   ABDOMINAL HYSTERECTOMY  2011   required wound vac for 6 weeks. ovaries had grown attached to her back bone   BRAIN SURGERY  2015   tumor resection-denies seizures   BREAST SURGERY Left 2014   biopsy. negative   HARDWARE REMOVAL Left 05/05/2018   Procedure: HARDWARE REMOVAL- LEFT ANKLE;  Surgeon: Hessie Knows, MD;   Location: [facility];  Service: Orthopedics;  Laterality: Left;   I & D EXTREMITY Left 08/25/2015   Procedure: IRRIGATION AND DEBRIDEMENT THUMB;  Surgeon: Iran Planas, MD;  Location: [place];  Service: Orthopedics;  Laterality: Left;   ORIF ANKLE FRACTURE Left 02/20/2018   Procedure: OPEN REDUCTION INTERNAL FIXATION (ORIF) ANKLE FRACTURE;  Surgeon: Hessie Knows, MD;  Location: [facility];  Service: Orthopedics;  Laterality: Left;    There were no vitals filed for this visit.   Subjective Assessment - 07/26/21 1332     Subjective "I feel better today." She reports continued right knee pain but reports less pain overall and less fatigue today;    Pertinent History Patient diagnosed with MS in 2001. Past medical history significant for HTN, MS, DJD right knee, Hyperlipidemia, Thrombocytopenia, S/p ORIF Left ankle fracture.    Limitations Lifting;Standing;Walking;House hold activities    How long can you sit comfortably? No restrictions    How long can you stand comfortably? No baseline ADL/IADL activity limited by standing tolerance    How long can you walk comfortably? today is limited by her walk to mailbox; maybe 15 min at evaluation    Patient Stated Goals Walk better and regain some lost strength.    Currently in Pain? Yes  Pain Score 6     Pain Location Knee    Pain Orientation Right    Pain Descriptors / Indicators Aching;Sore    Pain Type Chronic pain    Pain Onset More than a month ago    Pain Frequency Intermittent    Aggravating Factors  worse with prolonged activity/weight bearing    Pain Relieving Factors rest    Effect of Pain on Daily Activities decreased activity tolerance;    Multiple Pain Sites No                   TREATMENT:   Ex: 3# ankle weight: LAQ x15 reps x2 sets each LE with ankle DF to facilitate better ROM Patient denies any discomfort Educated patient how doing LAQ prior to standing after prolonged sitting can help alleviate knee  discomfort/stiffness;    Standing heel raises 3# x12 reps Standing hip abduction 3# x10 reps each LE Patient reports min difficulty with standing exercise, most limited with weight bearing in RLE  Standing on bottom step, hip flexion march against green tband (to 3rd step) x15 reps each LE, moderate difficulty requiring BUE rail assist;    NMR: Standing on airex pad: Standing feet shoulder width apart:             -eyes open 30 sec hold, eyes closed 30 sec hold, no sway             Head turns side/side x5 reps             trunk rotation side/side x5 reps each direction;    Reach side/side outside base of support x5 reps each with cues for increased reach to challenge lateral weight shift  Reach with BUE ball tap to target to challenge lateral weight shift and stance control x5 reps each direction;   Progressed to tandem stance on airex pad:             Eyes open open 15 sec hold, eyes closed 10 sec hold with minimal sway             Eyes open head turns side/side x5 reps  Patient required CGA for safety with increased instability noted when turning head and narrow base of support;     Gait in gym forward/backward walking x40 feet x2 sets unsupported with head turns side/side; required supervision and cues to increase step length for safety;   Side stepping in hallway tapping ball to wall x6 reps each direction;   Standing on firm surface: Forward step over 1/2 bolster with RLE only and then step back, unsupported x5 reps, progressed to holding small ball in BUE with BUE ball overhead reach x5 reps with CGA for safety  Side step over 1/2 bolster with RLE only and then step back x3 reps unsupported, no difficulty, progressed to UE reach to side (1/2 grinding corn Tai Chi exercise) to challenge dynamic balance x5 reps, minimal difficulty;    Tolerated session fair. She does report increased right knee pain requiring exercise modification to reduce strain on knee.  Reinforced HEP She is  scheduled for knee injection on Monday 07/30/21                     PT Education - 07/26/21 1333     Education Details exercise technique/positioning;    Person(s) Educated Patient    Methods Explanation;Verbal cues    Comprehension Verbalized understanding;Returned demonstration;Verbal cues required;Need further instruction  PT Short Term Goals - 06/13/21 1109       PT SHORT TERM GOAL #1   Title Pt will be independent with HEP in order to improve strength and balance in order to decrease fall risk and improve function at home and work.    Baseline 09/27/2020- Patient has no formal HEP in place, 5/19: independent; 7/12: not performing, handout is lost 8/17: depends on the day due to medication changes and pain 9/6: intermittent compliance, 9/27: intermittent compliant due MS flare up and knee pain; 11/2: hasn't been doing formal HEP, but has been active; has been traveling a lot; 12/6: mixed adherence due to holidays; 1/4: mixed adherenc with holidays and traveling;    Time 4    Period Weeks    Status Partially Met    Target Date 07/11/21               PT Long Term Goals - 06/13/21 1109       PT LONG TERM GOAL #1   Title Pt will improve FOTO score from 61  to target score of 64 to display perceived improvements in ability to complete ADL's and functional mobility activities.    Baseline 09/27/2020: FOTO=61, 10/26/20: 64%; 7/12: 58 8/17: 55% 9/6 63%, 9/27: 56%, 11/2: 62%, 1/4: 54%    Time 8    Period Weeks    Status Partially Met    Target Date 08/08/21      PT LONG TERM GOAL #2   Title Pt will improve DGI by at least 3 points in order to demonstrate clinically significant improvement in balance and decreased risk for falls.    Baseline 09/27/2020-DGI=18/24, 5/19: 18/24 7/12: not tested; 7/15: 22/24, 1/4: 19/24    Time 8    Period Weeks    Status On-going    Target Date 08/08/21      PT LONG TERM GOAL #3   Title Pt will improve ABC by at  least 10 % in order to demonstrate clinically significant improvement in balance confidence.    Baseline 09/27/2020-68.1%, 5/19: 78% 7/12: not assessed; 7/15: 61.25    Time 8    Period Weeks    Status On-going    Target Date 08/08/21      PT LONG TERM GOAL #4   Title Pt will decrease 5TSTS by at least 5 seconds (initial =23 sec with no UE support) in order to demonstrate clinically significant improvement in LE strength.    Baseline 09/27/2020= 23 sec. with No UE support, 5/19: 15 sec; 7/12: not assessed; 7/15: 17.68 sec 8/17: 13.8 seconds, 9/27: deferred due to knee pain;, 11/2: 14.5 sec without HHA, 1/4: deferred due to knee pain;    Time 8    Period Weeks    Status Achieved    Target Date 08/08/21      PT LONG TERM GOAL #5   Title Pt will decrease TUG to below 14 seconds/decrease in order to demonstrate decreased fall risk.    Baseline 09/27/2020- TUG score= 15.15 sec without UE support, 5/19: 8.89 sec; 12/19/20: not assessed; 7/15: 9.8 sec 9/6: 9.66 seconds, 11/2: 8.89 sec, 1/4: 8.12 sec;    Time 8    Period Weeks    Status Achieved    Target Date 08/08/21      PT LONG TERM GOAL #6   Title Patient will increase six minute walk test distance to >1000 for progression to community ambulator and improve gait ability with BP in functional level and moderate fatigue  or less;    Baseline 4/26: 695 ft with one seated rest break, 5/19: 975 feet; 7/12: not safe to attempt, BP too high 8/17: 610 ft with multiple rest breaks, 9/27: 990 feet, high BP 11/2: 1120 with high bp, 12/6: not formally assessed due to achiness and fatigue, 1/4: deferred due to recent sickness/congestion    Time 8    Period Weeks    Status On-going    Target Date 08/08/21      PT LONG TERM GOAL #7   Title Patient will increase Functional Gait Assessment score to >22/30 as to reduce fall risk and improve dynamic gait safety with community ambulation.    Baseline 9/6: 16/30, 9/27: Deferred due to time; 11/2: 18/30, 1/4:  17/30    Time 8    Period Weeks    Status Partially Met    Target Date 08/08/21      PT LONG TERM GOAL #8   Title Patient will tolerate 15 seconds of single leg stance without loss of balance to improve ability to get in and out of shower safely.    Baseline 9/6: L 9 seconds R 7 seconds, 9/27: R: 4-5 sec, limited by knee pain, L: 18 sec, 11/2: deferred;, 1/4: R: 5 sec, L: 20 sec; right is limited by knee pain;    Time 8    Period Weeks    Status Partially Met    Target Date 08/08/21                   Plan - 07/26/21 1333     Clinical Impression Statement Patient motivated and participated well within session. She is still having continued RLE knee pain which limits some activity. Exercise instructed in straight leg position to reduce strain on knee. She required short seated rest breaks at time to reduce knee discomfort. Progressed LE strengthening with increased resistance. Patient reports mild difficulty but denies any increase in knee pain. She was instructed in advanced balance tasks utilizing compliant surface and challenging stepping in various directions. Patient instructed in stepping with RLE to facilitate increased LLE weight bearing for less discomfort and to challenge stance control. She does require CGA to min A throughout session. She would benefit from additional skilled PT Intervention to improve strength, balance and mobility; She is scheduled for knee injection on Monday 07/30/21    Personal Factors and Comorbidities Comorbidity 3+    Comorbidities HTN, MS, Arthritis, COPD    Examination-Activity Limitations Bend;Carry;Continence;Lift;Squat;Stairs;Stand    Examination-Participation Restrictions Community Activity;Yard Work    Stability/Clinical Decision Making Stable/Uncomplicated    Rehab Potential Poor    PT Frequency 2x / week    PT Duration 8 weeks    PT Treatment/Interventions ADLs/Self Care Home Management;Cryotherapy;Moist Heat;DME Instruction;Gait  training;Stair training;Functional mobility training;Therapeutic activities;Therapeutic exercise;Balance training;Neuromuscular re-education;Patient/family education;Manual techniques;Passive range of motion    PT Next Visit Plan Limit AMB activity if BP conitnues to go to unsafe HTN (>[id number] systolic); review HEP for home including balance prn    PT Home Exercise Plan pt unaware of location of her original handout, has not been compliant, warrants review (12/19/20)    Consulted and Agree with Plan of Care Patient             Patient will benefit from skilled therapeutic intervention in order to improve the following deficits and impairments:  Abnormal gait, Cardiopulmonary status limiting activity, Decreased activity tolerance, Decreased balance, Decreased coordination, Decreased endurance, Decreased mobility, Decreased range of motion, Decreased strength, Difficulty  walking, Impaired flexibility, Obesity, Pain  Visit Diagnosis: Other abnormalities of gait and mobility  Other lack of coordination  Difficulty in walking, not elsewhere classified  Muscle weakness (generalized)  Unsteadiness on feet  Abnormality of gait and mobility     Problem List Patient Active Problem List   Diagnosis Date Noted   Atherosclerosis of abdominal aorta ([place]) 01/10/2021   Coronary artery disease involving native coronary artery of native heart 01/10/2021   OSA (obstructive sleep apnea) 04/19/2020   Statin intolerance 01/14/2020   S/P ORIF (open reduction internal fixation) fracture 02/20/2018   DJD (degenerative joint disease) of knee 01/02/2017   Advanced care planning/counseling discussion 01/02/2017   Thrombocytopenia ([place]) 10/30/2015   Multiple sclerosis ([place])    Essential hypertension    Hyperlipidemia    Benign neoplasm of brain ([place]) 04/30/2013   Cerebral meningioma ([place]) 04/29/2013   Malignant hypertension 04/29/2013   Obesity 04/15/2013    Gerren Hoffmeier, PT, DPT 07/26/2021,  1:39 PM  [address] [place], [place], [address] Phone: [phone]   Fax:  [phone]  Name: SHYLEIGH DAUGHTRY MRN: [id number] Date of Birth: [birth year]

## 2021-07-31 ENCOUNTER — Ambulatory Visit: Payer: Medicare Other | Admitting: Physical Therapy

## 2021-07-31 ENCOUNTER — Other Ambulatory Visit: Payer: Self-pay

## 2021-07-31 ENCOUNTER — Encounter: Payer: Self-pay | Admitting: Physical Therapy

## 2021-07-31 DIAGNOSIS — R2689 Other abnormalities of gait and mobility: Secondary | ICD-10-CM | POA: Diagnosis not present

## 2021-07-31 DIAGNOSIS — R2681 Unsteadiness on feet: Secondary | ICD-10-CM

## 2021-07-31 DIAGNOSIS — M6281 Muscle weakness (generalized): Secondary | ICD-10-CM

## 2021-07-31 DIAGNOSIS — R262 Difficulty in walking, not elsewhere classified: Secondary | ICD-10-CM

## 2021-07-31 DIAGNOSIS — R278 Other lack of coordination: Secondary | ICD-10-CM

## 2021-07-31 NOTE — Therapy (Signed)
[place] [facility] [address] [place], [place], [address] Phone: [phone]   Fax:  [phone]  Physical Therapy Treatment Physical Therapy Progress Note   Dates of reporting period  05/15/21   to   07/31/21   Patient Details  Name: Leslie Smith MRN: [id number] Date of Birth: 1951-03-28 Referring Provider (PT): Dr. Darlin Priestly   Encounter Date: 07/31/2021   PT End of Session - 07/31/21 1101     Visit Number 50    Number of Visits 72    Date for PT Re-Evaluation 08/08/21    Authorization Type Medicare A&B; Medicaid 2nd; VL review after 25 visits    Authorization Time Period 3/54/65-11/16/10; previous cert 7/51/7001-7/49/4496    Authorization - Visit Number 55    Authorization - Number of Visits 25    PT Start Time 7591    PT Stop Time 1145    PT Time Calculation (min) 43 min    Equipment Utilized During Treatment Gait belt    Activity Tolerance Patient tolerated treatment well;Patient limited by fatigue    Behavior During Therapy WFL for tasks assessed/performed             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[place]) 09/15/2014   Bladder incontinence    Cancer ([place]) 2014   brain tumor, resected   Cellulitis and abscess of leg 2019   2 episodes this year   Complication of anesthesia    DJD (degenerative joint disease) of knee 2018   Fall    Fatigue    History of blood transfusion    Hypertension    MS (multiple sclerosis) ([place])    Myelitis ([place])    Neuromuscular disorder ([place]) 2001   multiple sclerosis diagnosed by MRI   PONV (postoperative nausea and vomiting)    severe   Thrombocytopenia ([place])     Past Surgical History:  Procedure Laterality Date   ABDOMINAL HYSTERECTOMY  2011   required wound vac for 6 weeks. ovaries had grown attached to her back bone   BRAIN SURGERY  2015   tumor resection-denies seizures   BREAST SURGERY Left 2014   biopsy. negative   HARDWARE REMOVAL Left  05/05/2018   Procedure: HARDWARE REMOVAL- LEFT ANKLE;  Surgeon: Hessie Knows, MD;  Location: [facility];  Service: Orthopedics;  Laterality: Left;   I & D EXTREMITY Left 08/25/2015   Procedure: IRRIGATION AND DEBRIDEMENT THUMB;  Surgeon: Iran Planas, MD;  Location: [place];  Service: Orthopedics;  Laterality: Left;   ORIF ANKLE FRACTURE Left 02/20/2018   Procedure: OPEN REDUCTION INTERNAL FIXATION (ORIF) ANKLE FRACTURE;  Surgeon: Hessie Knows, MD;  Location: [facility];  Service: Orthopedics;  Laterality: Left;    There were no vitals filed for this visit.   Subjective Assessment - 07/31/21 1118     Subjective Patient reports getting injection in right knee yesterday and states her pain is better; She did lose bridge on teeth and is waiting for dental appointment; She presents to therapy with support animal;    Pertinent History Patient diagnosed with MS in 2001. Past medical history significant for HTN, MS, DJD right knee, Hyperlipidemia, Thrombocytopenia, S/p ORIF Left ankle fracture.    Limitations Lifting;Standing;Walking;House hold activities    How long can you sit comfortably? No restrictions    How long can you stand comfortably? No baseline ADL/IADL activity limited by standing tolerance    How long can you walk comfortably? today is  limited by her walk to mailbox; maybe 15 min at evaluation    Patient Stated Goals Walk better and regain some lost strength.    Currently in Pain? Yes    Pain Score 5     Pain Location Knee    Pain Orientation Right    Pain Descriptors / Indicators Aching;Sore    Pain Type Chronic pain    Pain Onset More than a month ago    Pain Frequency Intermittent    Aggravating Factors  worse with prolonged activity/weight bearing    Pain Relieving Factors rest    Effect of Pain on Daily Activities decreased activity tolerance;    Multiple Pain Sites No                [facility] PT Assessment - 08/01/21 0001       6 Minute walk- Post Test   BP (mmHg)  222/67   initially; after short rest break: 175/64   HR (bpm) 134    02 Sat (%RA) 97 %    Modified Borg Scale for Dyspnea 4- somewhat severe      6 minute walk test results    Aerobic Endurance Distance Walked 1100    Endurance additional comments with 1 seated rest break, with support animal   reports increased             TREATMENT:   Ex: Instructed patient in 6 min walk test, see above; Pt did have elevated BP which was alleviated with short rest break. Her BP is increasing with activity due to poor endurance; She did require less rest breaks with gait this session compared to last 6 min walk test;   Standing on bottom step, hip flexion march against green tband (to 3rd step) x15 reps each LE, moderate difficulty requiring BUE rail assist;   Standing with red tband around BLE: -hip abduction 2x10 with cues for toes rotate in for better abductor strengthening -BLE heel raises 3 sec hold, minimal finger tip hold 2x10 reps;   NMR:   Gait in hallway forward/backward walking x20 feet x2 sets with support animal, supervision with good step length and good control;  Weaving around cones #5, stepping over orange hurdle x4 laps with support dog, good reciprocal gait pattern and control; Dog did have difficulty turning to left but was able to improve positioning with increased repetition;   Weaving backward through cones #5 x1 set with support dog, with good control and positioning;      Tolerated session fair. She reports less knee discomfort during session due to recent knee injection. She was able to exhibit better dynamic balance when walking with support dog. Patient continues to be limited with decreased endurance requiring short seated rest breaks; Patient's condition has the potential to improve in response to therapy. Maximum improvement is yet to be obtained. The anticipated improvement is attainable and reasonable in a generally predictable time.  Patient reports mixed  adherence with HEP depending on knee pain and recent sickness. She is motivated to improve mobility as she is wanting to travel in the next month;                               PT Education - 07/31/21 1101     Education Details exercise technique/positioning;    Person(s) Educated Patient    Methods Explanation;Verbal cues    Comprehension Verbalized understanding;Returned demonstration;Verbal cues required;Need further instruction  PT Short Term Goals - 08/01/21 7106       PT SHORT TERM GOAL #1   Title Pt will be independent with HEP in order to improve strength and balance in order to decrease fall risk and improve function at home and work.    Baseline 09/27/2020- Patient has no formal HEP in place, 5/19: independent; 7/12: not performing, handout is lost 8/17: depends on the day due to medication changes and pain 9/6: intermittent compliance, 9/27: intermittent compliant due MS flare up and knee pain; 11/2: hasn't been doing formal HEP, but has been active; has been traveling a lot; 12/6: mixed adherence due to holidays; 1/4: mixed adherenc with holidays and traveling; 2/21: intermittent, has been sick and traveling.    Time 4    Period Weeks    Status Partially Met    Target Date 07/11/21               PT Long Term Goals - 08/01/21 0851       PT LONG TERM GOAL #1   Title Pt will improve FOTO score from 61  to target score of 64 to display perceived improvements in ability to complete ADL's and functional mobility activities.    Baseline 09/27/2020: FOTO=61, 10/26/20: 64%; 7/12: 58 8/17: 55% 9/6 63%, 9/27: 56%, 11/2: 62%, 1/4: 54%    Time 8    Period Weeks    Status Partially Met    Target Date 08/08/21      PT LONG TERM GOAL #2   Title Pt will improve DGI by at least 3 points in order to demonstrate clinically significant improvement in balance and decreased risk for falls.    Baseline 09/27/2020-DGI=18/24, 5/19: 18/24 7/12: not  tested; 7/15: 22/24, 1/4: 19/24    Time 8    Period Weeks    Status On-going    Target Date 08/08/21      PT LONG TERM GOAL #3   Title Pt will improve ABC by at least 10 % in order to demonstrate clinically significant improvement in balance confidence.    Baseline 09/27/2020-68.1%, 5/19: 78% 7/12: not assessed; 7/15: 61.25    Time 8    Period Weeks    Status On-going    Target Date 08/08/21      PT LONG TERM GOAL #4   Title Pt will decrease 5TSTS by at least 5 seconds (initial =23 sec with no UE support) in order to demonstrate clinically significant improvement in LE strength.    Baseline 09/27/2020= 23 sec. with No UE support, 5/19: 15 sec; 7/12: not assessed; 7/15: 17.68 sec 8/17: 13.8 seconds, 9/27: deferred due to knee pain;, 11/2: 14.5 sec without HHA, 1/4: deferred due to knee pain;    Time 8    Period Weeks    Status Achieved    Target Date 08/08/21      PT LONG TERM GOAL #5   Title Pt will decrease TUG to below 14 seconds/decrease in order to demonstrate decreased fall risk.    Baseline 09/27/2020- TUG score= 15.15 sec without UE support, 5/19: 8.89 sec; 12/19/20: not assessed; 7/15: 9.8 sec 9/6: 9.66 seconds, 11/2: 8.89 sec, 1/4: 8.12 sec;    Time 8    Period Weeks    Status Achieved    Target Date 08/08/21      PT LONG TERM GOAL #6   Title Patient will increase six minute walk test distance to >1000 for progression to community ambulator and improve gait ability with  BP in functional level and moderate fatigue or less;    Baseline 4/26: 695 ft with one seated rest break, 5/19: 975 feet; 7/12: not safe to attempt, BP too high 8/17: 610 ft with multiple rest breaks, 9/27: 990 feet, high BP 11/2: 1120 with high bp, 12/6: not formally assessed due to achiness and fatigue, 1/4: deferred due to recent sickness/congestion, 2/21:1100 ft with elevated BP    Time 8    Period Weeks    Status Partially Met    Target Date 08/08/21      PT LONG TERM GOAL #7   Title Patient will  increase Functional Gait Assessment score to >22/30 as to reduce fall risk and improve dynamic gait safety with community ambulation.    Baseline 9/6: 16/30, 9/27: Deferred due to time; 11/2: 18/30, 1/4: 17/30    Time 8    Period Weeks    Status Partially Met    Target Date 08/08/21      PT LONG TERM GOAL #8   Title Patient will tolerate 15 seconds of single leg stance without loss of balance to improve ability to get in and out of shower safely.    Baseline 9/6: L 9 seconds R 7 seconds, 9/27: R: 4-5 sec, limited by knee pain, L: 18 sec, 11/2: deferred;, 1/4: R: 5 sec, L: 20 sec; right is limited by knee pain;    Time 8    Period Weeks    Status Partially Met    Target Date 08/08/21                   Plan - 07/31/21 1143     Clinical Impression Statement Patient motivated and participated well within session. She was able to utilize support dog during session to improve balance/stability. She was instructed in advanced LE strengthening/balance exercise; Patient reports slight discomfort in RLE knee with added exercise, but reports less discomfort compared to previous sessions. Patient instructed in 6 min walk test. She exhibits better distance, requiring fewer rest breaks. However she is still experiencing elevated BP due to deconditioning. Patient would benefit from additional skilled PT Intervention to improve strength, balance and mobility.    Personal Factors and Comorbidities Comorbidity 3+    Comorbidities HTN, MS, Arthritis, COPD    Examination-Activity Limitations Bend;Carry;Continence;Lift;Squat;Stairs;Stand    Examination-Participation Restrictions Community Activity;Yard Work    Stability/Clinical Decision Making Stable/Uncomplicated    Rehab Potential Poor    PT Frequency 2x / week    PT Duration 8 weeks    PT Treatment/Interventions ADLs/Self Care Home Management;Cryotherapy;Moist Heat;DME Instruction;Gait training;Stair training;Functional mobility  training;Therapeutic activities;Therapeutic exercise;Balance training;Neuromuscular re-education;Patient/family education;Manual techniques;Passive range of motion    PT Next Visit Plan Limit AMB activity if BP conitnues to go to unsafe HTN (>[id number] systolic); review HEP for home including balance prn    PT Home Exercise Plan pt unaware of location of her original handout, has not been compliant, warrants review (12/19/20)    Consulted and Agree with Plan of Care Patient             Patient will benefit from skilled therapeutic intervention in order to improve the following deficits and impairments:  Abnormal gait, Cardiopulmonary status limiting activity, Decreased activity tolerance, Decreased balance, Decreased coordination, Decreased endurance, Decreased mobility, Decreased range of motion, Decreased strength, Difficulty walking, Impaired flexibility, Obesity, Pain  Visit Diagnosis: Other abnormalities of gait and mobility  Other lack of coordination  Difficulty in walking, not elsewhere classified  Muscle weakness (generalized)  Unsteadiness on feet     Problem List Patient Active Problem List   Diagnosis Date Noted   Atherosclerosis of abdominal aorta ([place]) 01/10/2021   Coronary artery disease involving native coronary artery of native heart 01/10/2021   OSA (obstructive sleep apnea) 04/19/2020   Statin intolerance 01/14/2020   S/P ORIF (open reduction internal fixation) fracture 02/20/2018   DJD (degenerative joint disease) of knee 01/02/2017   Advanced care planning/counseling discussion 01/02/2017   Thrombocytopenia ([place]) 10/30/2015   Multiple sclerosis ([place])    Essential hypertension    Hyperlipidemia    Benign neoplasm of brain ([place]) 04/30/2013   Cerebral meningioma ([place]) 04/29/2013   Malignant hypertension 04/29/2013   Obesity 04/15/2013    Trotter,Margaret, PT, DPT 08/01/2021, 8:57 AM  [facility] [address] [place], [place], [address] Phone: [phone]   Fax:  [phone]  Name: YANICE MAQUEDA MRN: [id number] Date of Birth: Aug 03, 1950

## 2021-08-02 ENCOUNTER — Other Ambulatory Visit: Payer: Self-pay

## 2021-08-02 ENCOUNTER — Ambulatory Visit: Payer: Medicare Other | Admitting: Physical Therapy

## 2021-08-02 VITALS — BP 155/73 | HR 65

## 2021-08-02 DIAGNOSIS — R2681 Unsteadiness on feet: Secondary | ICD-10-CM

## 2021-08-02 DIAGNOSIS — R2689 Other abnormalities of gait and mobility: Secondary | ICD-10-CM

## 2021-08-02 DIAGNOSIS — M6281 Muscle weakness (generalized): Secondary | ICD-10-CM

## 2021-08-02 DIAGNOSIS — R269 Unspecified abnormalities of gait and mobility: Secondary | ICD-10-CM

## 2021-08-02 DIAGNOSIS — R262 Difficulty in walking, not elsewhere classified: Secondary | ICD-10-CM

## 2021-08-02 DIAGNOSIS — R278 Other lack of coordination: Secondary | ICD-10-CM

## 2021-08-02 NOTE — Therapy (Signed)
[place] °[facility] °[address] °[place], [place], [address] °Phone: [phone]   Fax:  [phone] ° °Physical Therapy Treatment ° °Patient Details  °Name: Leslie Smith °MRN: [id number] °Date of Birth: 07/16/1950 °Referring Provider (PT): Dr. Suma Shah ° ° °Encounter Date: 08/02/2021 ° ° PT End of Session - 08/02/21 0939   ° ° Visit Number 51   ° Number of Visits 72   ° Date for PT Re-Evaluation 08/08/21   ° Authorization Type Medicare A&B; Medicaid 2nd; VL review after 25 visits   ° Authorization Time Period 12/19/20-02/13/21; previous cert 09/27/2020-12/20/2020   ° Authorization - Visit Number 20   ° Authorization - Number of Visits 25   ° PT Start Time 0933   ° PT Stop Time 1015   ° PT Time Calculation (min) 42 min   ° Equipment Utilized During Treatment Gait belt   ° Activity Tolerance Patient tolerated treatment well;Patient limited by fatigue   ° Behavior During Therapy WFL for tasks assessed/performed   ° °  °  ° °  ° ° °Past Medical History:  °Diagnosis Date  ° 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HCC) 09/15/2014  ° Bladder incontinence   ° Cancer (HCC) 2014  ° brain tumor, resected  ° Cellulitis and abscess of leg 2019  ° 2 episodes this year  ° Complication of anesthesia   ° DJD (degenerative joint disease) of knee 2018  ° Fall   ° Fatigue   ° History of blood transfusion   ° Hypertension   ° MS (multiple sclerosis) (HCC)   ° Myelitis (HCC)   ° Neuromuscular disorder (HCC) 2001  ° multiple sclerosis diagnosed by MRI  ° PONV (postoperative nausea and vomiting)   ° severe  ° Thrombocytopenia (HCC)   ° ° °Past Surgical History:  °Procedure Laterality Date  ° ABDOMINAL HYSTERECTOMY  2011  ° required wound vac for 6 weeks. ovaries had grown attached to her back bone  ° BRAIN SURGERY  2015  ° tumor resection-denies seizures  ° BREAST SURGERY Left 2014  ° biopsy. negative  ° HARDWARE REMOVAL Left 05/05/2018  ° Procedure: HARDWARE REMOVAL- LEFT ANKLE;  Surgeon: Menz, Michael, MD;   Location: [facility]S;  Service: Orthopedics;  Laterality: Left;  ° I & D EXTREMITY Left 08/25/2015  ° Procedure: IRRIGATION AND DEBRIDEMENT THUMB;  Surgeon: Fred Ortmann, MD;  Location: [facility];  Service: Orthopedics;  Laterality: Left;  ° ORIF ANKLE FRACTURE Left 02/20/2018  ° Procedure: OPEN REDUCTION INTERNAL FIXATION (ORIF) ANKLE FRACTURE;  Surgeon: Menz, Michael, MD;  Location: [facility];  Service: Orthopedics;  Laterality: Left;  ° ° °Vitals:  ° 08/02/21 0936  °BP: (!) 155/73  °Pulse: 65  ° ° ° Subjective Assessment - 08/02/21 0936   ° ° Subjective Patient reports feeling really good yesterday and over did it. She reports increased fatigue and some soreness in RLE knee; She presents to therapy without Support dog.   ° Pertinent History Patient diagnosed with MS in 2001. Past medical history significant for HTN, MS, DJD right knee, Hyperlipidemia, Thrombocytopenia, S/p ORIF Left ankle fracture.   ° Limitations Lifting;Standing;Walking;House hold activities   ° How long can you sit comfortably? No restrictions   ° How long can you stand comfortably? No baseline ADL/IADL activity limited by standing tolerance   ° How long can you walk comfortably? today is limited by her walk to mailbox; maybe 15 min at evaluation   ° Patient Stated Goals Walk better   and regain some lost strength.    Currently in Pain? Yes    Pain Score 4     Pain Location Knee    Pain Orientation Right    Pain Descriptors / Indicators Aching;Sore    Pain Type Chronic pain    Pain Onset More than a month ago    Pain Frequency Intermittent    Aggravating Factors  worse with prolonged activity/weight bearing    Pain Relieving Factors rest    Effect of Pain on Daily Activities decreased activity tolerance;    Multiple Pain Sites No                 TREATMENT:   Ex:  Standing on bottom step, hip flexion march against green tband (to 3rd step) 2x15 reps each LE, moderate difficulty requiring BUE rail assist;   Seated  LAQ 3# x15  reps each LE  Standing with 3# ankle weight: -hip abduction SLR 2x15 reps each LE with cues for foot orientation to isolate hip strengthening; -Stepping over orange hurdle with RLE only 3# with cues for heel strike on RLE x15 reps with LUE rail assist -side stepping over orange hurdle x10 reps progressing from just stepping over and then adding reach outside base of support;   Gait around gym  2x150 feet with 3# ankle weight to challenge LE strength and endurance; required min VCS to increase step length for better gait safety;  Forward/backward walking x40 feet x2 sets;   Vitals after standing exercise: BP 158/88   Standing with feet apart: Tapping blue weight ball from chair to up diagonal to wall and back x10 reps each direction;  Holding blue weighted ball, squat down tapping ball to bottom step and then standing up and lifting ball overhead x10 reps BP after exercise: 202/70, HR 76   Tolerated session fair. She reports less knee discomfort during session due to recent knee injection. She does continue to fatigue with advanced exercise requiring short seated rest break. Vitals monitored and WFL- BP elevated but not too high. Patient does report intermittent tweaking of RLE                                PT Education - 08/02/21 0939     Education Details exercise technique/positioning;    Person(s) Educated Patient    Methods Explanation;Verbal cues    Comprehension Verbalized understanding;Returned demonstration;Verbal cues required;Need further instruction              PT Short Term Goals - 08/01/21 0842       PT SHORT TERM GOAL #1   Title Pt will be independent with HEP in order to improve strength and balance in order to decrease fall risk and improve function at home and work.    Baseline 09/27/2020- Patient has no formal HEP in place, 5/19: independent; 7/12: not performing, handout is lost 8/17: depends on the day due to medication changes and  pain 9/6: intermittent compliance, 9/27: intermittent compliant due MS flare up and knee pain; 11/2: hasn't been doing formal HEP, but has been active; has been traveling a lot; 12/6: mixed adherence due to holidays; 1/4: mixed adherenc with holidays and traveling; 2/21: intermittent, has been sick and traveling.    Time 4    Period Weeks    Status Partially Met    Target Date 07/11/21  PT Long Term Goals - 08/01/21 0851   ° °  ° PT LONG TERM GOAL #1  ° Title Pt will improve FOTO score from 61  to target score of 64 to display perceived improvements in ability to complete ADL's and functional mobility activities.   ° Baseline 09/27/2020: FOTO=61, 10/26/20: 64%; 7/12: 58 8/17: 55% 9/6 63%, 9/27: 56%, 11/2: 62%, 1/4: 54%   ° Time 8   ° Period Weeks   ° Status Partially Met   ° Target Date 08/08/21   °  ° PT LONG TERM GOAL #2  ° Title Pt will improve DGI by at least 3 points in order to demonstrate clinically significant improvement in balance and decreased risk for falls.   ° Baseline 09/27/2020-DGI=18/24, 5/19: 18/24 7/12: not tested; 7/15: 22/24, 1/4: 19/24   ° Time 8   ° Period Weeks   ° Status On-going   ° Target Date 08/08/21   °  ° PT LONG TERM GOAL #3  ° Title Pt will improve ABC by at least 10 % in order to demonstrate clinically significant improvement in balance confidence.   ° Baseline 09/27/2020-68.1%, 5/19: 78% 7/12: not assessed; 7/15: 61.25   ° Time 8   ° Period Weeks   ° Status On-going   ° Target Date 08/08/21   °  ° PT LONG TERM GOAL #4  ° Title Pt will decrease 5TSTS by at least 5 seconds (initial =23 sec with no UE support) in order to demonstrate clinically significant improvement in LE strength.   ° Baseline 09/27/2020= 23 sec. with No UE support, 5/19: 15 sec; 7/12: not assessed; 7/15: 17.68 sec 8/17: 13.8 seconds, 9/27: deferred due to knee pain;, 11/2: 14.5 sec without HHA, 1/4: deferred due to knee pain;   ° Time 8   ° Period Weeks   ° Status Achieved   ° Target Date  08/08/21   °  ° PT LONG TERM GOAL #5  ° Title Pt will decrease TUG to below 14 seconds/decrease in order to demonstrate decreased fall risk.   ° Baseline 09/27/2020- TUG score= 15.15 sec without UE support, 5/19: 8.89 sec; 12/19/20: not assessed; 7/15: 9.8 sec 9/6: 9.66 seconds, 11/2: 8.89 sec, 1/4: 8.12 sec;   ° Time 8   ° Period Weeks   ° Status Achieved   ° Target Date 08/08/21   °  ° PT LONG TERM GOAL #6  ° Title Patient will increase six minute walk test distance to >1000 for progression to community ambulator and improve gait ability with BP in functional level and moderate fatigue or less;   ° Baseline 4/26: 695 ft with one seated rest break, 5/19: 975 feet; 7/12: not safe to attempt, BP too high 8/17: 610 ft with multiple rest breaks, 9/27: 990 feet, high BP 11/2: 1120 with high bp, 12/6: not formally assessed due to achiness and fatigue, 1/4: deferred due to recent sickness/congestion, 2/21:1100 ft with elevated BP   ° Time 8   ° Period Weeks   ° Status Partially Met   ° Target Date 08/08/21   °  ° PT LONG TERM GOAL #7  ° Title Patient will increase Functional Gait Assessment score to >22/30 as to reduce fall risk and improve dynamic gait safety with community ambulation.   ° Baseline 9/6: 16/30, 9/27: Deferred due to time; 11/2: 18/30, 1/4: 17/30   ° Time 8   ° Period Weeks   ° Status Partially Met   ° Target Date   08/08/21   °  ° PT LONG TERM GOAL #8  ° Title Patient will tolerate 15 seconds of single leg stance without loss of balance to improve ability to get in and out of shower safely.   ° Baseline 9/6: L 9 seconds R 7 seconds, 9/27: R: 4-5 sec, limited by knee pain, L: 18 sec, 11/2: deferred;, 1/4: R: 5 sec, L: 20 sec; right is limited by knee pain;   ° Time 8   ° Period Weeks   ° Status Partially Met   ° Target Date 08/08/21   ° °  °  ° °  ° ° ° ° ° ° ° ° Plan - 08/02/21 0952   ° ° Clinical Impression Statement Patient motivated and participated well within session. She was instructed in advanced LE  strengthening exercise. She was educated in exercise to minimal strain on RLE knee for better tolerance; She does report increased shortness of breath with advanced exercise. Vitals were assessed, BP sightly elevated, but not as bad. Patient does reports increased weakness in RLE. She would benefit from additional skilled PT Intervention to improve strength, balance and mobility;   ° Personal Factors and Comorbidities Comorbidity 3+   ° Comorbidities HTN, MS, Arthritis, COPD   ° Examination-Activity Limitations Bend;Carry;Continence;Lift;Squat;Stairs;Stand   ° Examination-Participation Restrictions Community Activity;Yard Work   ° Stability/Clinical Decision Making Stable/Uncomplicated   ° Rehab Potential Poor   ° PT Frequency 2x / week   ° PT Duration 8 weeks   ° PT Treatment/Interventions ADLs/Self Care Home Management;Cryotherapy;Moist Heat;DME Instruction;Gait training;Stair training;Functional mobility training;Therapeutic activities;Therapeutic exercise;Balance training;Neuromuscular re-education;Patient/family education;Manual techniques;Passive range of motion   ° PT Next Visit Plan Limit AMB activity if BP conitnues to go to unsafe HTN (>180mmHg systolic); review HEP for home including balance prn   ° PT Home Exercise Plan pt unaware of location of her original handout, has not been compliant, warrants review (12/19/20)   ° Consulted and Agree with Plan of Care Patient   ° °  °  ° °  ° ° °Patient will benefit from skilled therapeutic intervention in order to improve the following deficits and impairments:  Abnormal gait, Cardiopulmonary status limiting activity, Decreased activity tolerance, Decreased balance, Decreased coordination, Decreased endurance, Decreased mobility, Decreased range of motion, Decreased strength, Difficulty walking, Impaired flexibility, Obesity, Pain ° °Visit Diagnosis: °Other abnormalities of gait and mobility ° °Other lack of coordination ° °Difficulty in walking, not elsewhere  classified ° °Muscle weakness (generalized) ° °Unsteadiness on feet ° °Abnormality of gait and mobility ° ° ° ° °Problem List °Patient Active Problem List  ° Diagnosis Date Noted  ° Atherosclerosis of abdominal aorta (HCC) 01/10/2021  ° Coronary artery disease involving native coronary artery of native heart 01/10/2021  ° OSA (obstructive sleep apnea) 04/19/2020  ° Statin intolerance 01/14/2020  ° S/P ORIF (open reduction internal fixation) fracture 02/20/2018  ° DJD (degenerative joint disease) of knee 01/02/2017  ° Advanced care planning/counseling discussion 01/02/2017  ° Thrombocytopenia (HCC) 10/30/2015  ° Multiple sclerosis (HCC)   ° Essential hypertension   ° Hyperlipidemia   ° Benign neoplasm of brain (HCC) 04/30/2013  ° Cerebral meningioma (HCC) 04/29/2013  ° Malignant hypertension 04/29/2013  ° Obesity 04/15/2013  ° ° °Trotter,Margaret, PT, DPT °08/02/2021, 10:16 AM ° °[place] °[facility] °[address] °[place], [place], [address] °Phone: [phone]   Fax:  [phone] ° °Name: Leslie Smith °MRN: [id number] °Date of Birth: 07/10/1950 ° ° ° °

## 2021-08-03 ENCOUNTER — Ambulatory Visit: Payer: Medicare Other | Admitting: Oncology

## 2021-08-03 ENCOUNTER — Other Ambulatory Visit: Payer: Medicare Other

## 2021-08-07 ENCOUNTER — Ambulatory Visit: Payer: Medicare Other | Admitting: Physical Therapy

## 2021-08-09 ENCOUNTER — Ambulatory Visit: Payer: Medicare Other | Admitting: Physical Therapy

## 2021-08-10 ENCOUNTER — Inpatient Hospital Stay (HOSPITAL_BASED_OUTPATIENT_CLINIC_OR_DEPARTMENT_OTHER): Payer: Medicare Other | Admitting: Oncology

## 2021-08-10 ENCOUNTER — Encounter: Payer: Self-pay | Admitting: Oncology

## 2021-08-10 ENCOUNTER — Other Ambulatory Visit: Payer: Self-pay

## 2021-08-10 ENCOUNTER — Inpatient Hospital Stay: Payer: Medicare Other | Attending: Oncology

## 2021-08-10 VITALS — BP 145/68 | HR 80 | Temp 99.0°F | Resp 20 | Wt 293.2 lb

## 2021-08-10 DIAGNOSIS — Z79899 Other long term (current) drug therapy: Secondary | ICD-10-CM | POA: Insufficient documentation

## 2021-08-10 DIAGNOSIS — D693 Immune thrombocytopenic purpura: Secondary | ICD-10-CM | POA: Insufficient documentation

## 2021-08-10 DIAGNOSIS — E538 Deficiency of other specified B group vitamins: Secondary | ICD-10-CM

## 2021-08-10 LAB — CBC WITH DIFFERENTIAL/PLATELET
Abs Immature Granulocytes: 0.03 10*3/uL (ref 0.00–0.07)
Basophils Absolute: 0 10*3/uL (ref 0.0–0.1)
Basophils Relative: 0 %
Eosinophils Absolute: 0.1 10*3/uL (ref 0.0–0.5)
Eosinophils Relative: 1 %
HCT: 35.2 % — ABNORMAL LOW (ref 36.0–46.0)
Hemoglobin: 11.4 g/dL — ABNORMAL LOW (ref 12.0–15.0)
Immature Granulocytes: 0 %
Lymphocytes Relative: 12 %
Lymphs Abs: 1 10*3/uL (ref 0.7–4.0)
MCH: 29.4 pg (ref 26.0–34.0)
MCHC: 32.4 g/dL (ref 30.0–36.0)
MCV: 90.7 fL (ref 80.0–100.0)
Monocytes Absolute: 0.9 10*3/uL (ref 0.1–1.0)
Monocytes Relative: 11 %
Neutro Abs: 6.4 10*3/uL (ref 1.7–7.7)
Neutrophils Relative %: 76 %
Platelets: 159 10*3/uL (ref 150–400)
RBC: 3.88 MIL/uL (ref 3.87–5.11)
RDW: 14.5 % (ref 11.5–15.5)
WBC: 8.5 10*3/uL (ref 4.0–10.5)
nRBC: 0 % (ref 0.0–0.2)

## 2021-08-10 LAB — IRON AND TIBC
Iron: 31 ug/dL (ref 28–170)
Saturation Ratios: 9 % — ABNORMAL LOW (ref 10.4–31.8)
TIBC: 367 ug/dL (ref 250–450)
UIBC: 336 ug/dL

## 2021-08-10 LAB — FERRITIN: Ferritin: 62 ng/mL (ref 11–307)

## 2021-08-10 LAB — VITAMIN B12: Vitamin B-12: 1095 pg/mL — ABNORMAL HIGH (ref 180–914)

## 2021-08-10 LAB — FOLATE: Folate: 11.9 ng/mL (ref >5.9)

## 2021-08-10 NOTE — Progress Notes (Signed)
ITP follow up. Has an active URI, lungs with crackles and congestion. On her second round of antibiotics. Has generalized pain from MS. Denies any active bleeding from any where.  ?

## 2021-08-12 NOTE — Progress Notes (Signed)
Hematology/Oncology Consult note [facility]  Telephone:([phone] [phone]  Patient Care Team: Marguarite Arbour, MD as PCP - General (Internal Medicine)   Name of the patient: Leslie Smith  [id number]  [birth year]   Date of visit: 08/12/21  Diagnosis-ITP  Chief complaint/ Reason for visit-routine follow-up of ITP  Heme/Onc history: Patient is a 71 year old female with a past medical history significant for hypertension, multiple sclerosis who was referred to [place] for thrombocytopenia.  Of note patient has moderate thrombocytopenia at least dating back for the last 5 years.  Her CBC in November 2015 showed white count of 6.7, H&H of 12.8/40.1 and a platelet count of 49.  Since then her platelet count has gone up and down and usually remains between 40s to 60s.  On one occasion in 2017 it did drop down to 19.  Most recent platelet count from 01/04/2019 showed white count of 9.4, H&H of 11.5/35.5 and a platelet count of 62.  Patient has undergone hysterectomy in 2011 as well as ankle surgery in 2019 despite her low platelet count and did not have any significant bleeding issues.     Results of blood work from 01/04/2019 were as follows: CBC showed white count of 9.4, H&H 11.5/35.5 and a platelet count of 62.  B12 level is low at 219.  Folate was normal.  HIV testing was negative.  Smear review was unremarkable and H. pylori stool antigen was negative    Interval history- Denies any bleeding or bruising.She does report some nasal congestion and wheezing  ECOG PS- 1 Pain scale- 0   Review of systems- Review of Systems  Constitutional:  Negative for chills, fever, malaise/fatigue and weight loss.  HENT:  Positive for congestion. Negative for ear discharge and nosebleeds.   Eyes:  Negative for blurred vision.  Respiratory:  Negative for cough, hemoptysis, sputum production, shortness of breath and wheezing.   Cardiovascular:  Negative for chest pain,  palpitations, orthopnea and claudication.  Gastrointestinal:  Negative for abdominal pain, blood in stool, constipation, diarrhea, heartburn, melena, nausea and vomiting.  Genitourinary:  Negative for dysuria, flank pain, frequency, hematuria and urgency.  Musculoskeletal:  Negative for back pain, joint pain and myalgias.  Skin:  Negative for rash.  Neurological:  Negative for dizziness, tingling, focal weakness, seizures, weakness and headaches.  Endo/Heme/Allergies:  Does not bruise/bleed easily.  Psychiatric/Behavioral:  Negative for depression and suicidal ideas. The patient does not have insomnia.       Allergies  Allergen Reactions   Ezetimibe Other (See Comments)    Joint Pain    Watermelon [Citrullus Vulgaris] Diarrhea    Severe diarrhea within 2 minutes of eating   Adhesive [Tape]    Other Nausea And Vomiting    general anesthesia - deathly ill    Atorvastatin Other (See Comments)    Joint pain   Meloxicam Nausea Only     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HCC) 09/15/2014   Bladder incontinence    Cancer (HCC) 2014   brain tumor, resected   Cellulitis and abscess of leg 2019   2 episodes this year   Complication of anesthesia    DJD (degenerative joint disease) of knee 2018   Fall    Fatigue    History of blood transfusion    Hypertension    MS (multiple sclerosis) (HCC)    Myelitis (HCC)    Neuromuscular disorder (HCC) 2001   multiple sclerosis diagnosed  by MRI   PONV (postoperative nausea and vomiting)    severe   Thrombocytopenia ([place])      Past Surgical History:  Procedure Laterality Date   ABDOMINAL HYSTERECTOMY  2011   required wound vac for 6 weeks. ovaries had grown attached to her back bone   BRAIN SURGERY  2015   tumor resection-denies seizures   BREAST SURGERY Left 2014   biopsy. negative   HARDWARE REMOVAL Left 05/05/2018   Procedure: HARDWARE REMOVAL- LEFT ANKLE;  Surgeon: Hessie Knows, MD;  Location: [facility];   Service: Orthopedics;  Laterality: Left;   I & D EXTREMITY Left 08/25/2015   Procedure: IRRIGATION AND DEBRIDEMENT THUMB;  Surgeon: Iran Planas, MD;  Location: [place];  Service: Orthopedics;  Laterality: Left;   ORIF ANKLE FRACTURE Left 02/20/2018   Procedure: OPEN REDUCTION INTERNAL FIXATION (ORIF) ANKLE FRACTURE;  Surgeon: Hessie Knows, MD;  Location: [facility];  Service: Orthopedics;  Laterality: Left;    Social History   Socioeconomic History   Marital status: Single    Spouse name: Not on file   Number of children: Not on file   Years of education: high school, some college    Highest education level: High school graduate  Occupational History   Occupation: Training and development officer    Comment: disabled d/t MS  Tobacco Use   Smoking status: Never   Smokeless tobacco: Never  Vaping Use   Vaping Use: Never used  Substance and Sexual Activity   Alcohol use: Yes    Comment: occassional   Drug use: Yes    Types: Marijuana    Comment: uses for MS   Sexual activity: Not on file  Other Topics Concern   Not on file  Social History Narrative   [employer] at [place] Determinants of Health   Financial Resource Strain: Not on file  Food Insecurity: Not on file  Transportation Needs: Not on file  Physical Activity: Not on file  Stress: Not on file  Social Connections: Not on file  Intimate Partner Violence: Not on file    Family History  Problem Relation Age of Onset   Heart disease Father    Cancer Father      Current Outpatient Medications:    azithromycin (ZITHROMAX) 250 MG tablet, 2 tabs on day one, then 1 tab daily x 4 days, Disp: , Rfl:    Cholecalciferol (VITAMIN D-3) 25 MCG (1000 UT) CAPS, Take 1,000 Units by mouth daily., Disp: , Rfl:    ezetimibe (ZETIA) 10 MG tablet, Take 10 mg by mouth daily., Disp: , Rfl:    FEROSUL 325 (65 Fe) MG tablet, Take by mouth., Disp: , Rfl:    fluticasone (FLONASE) 50 MCG/ACT nasal spray, Place into the nose., Disp: , Rfl:    furosemide  (LASIX) 20 MG tablet, Take 20 mg by mouth 2 (two) times daily., Disp: , Rfl:    hydrALAZINE (APRESOLINE) 50 MG tablet, Take 1 tablet by mouth 2 (two) times daily., Disp: , Rfl:    HYDROcodone-acetaminophen (NORCO) 5-325 MG tablet, Take 1 tablet by mouth at bedtime as needed for moderate pain., Disp: 4 tablet, Rfl: 0   vitamin B-12 (CYANOCOBALAMIN) 1000 MCG tablet, Take 1,000 mcg by mouth daily., Disp: , Rfl:    ibuprofen (IBU) 800 MG tablet, Take 1 tablet (800 mg total) by mouth every 8 (eight) hours as needed for moderate pain. (Patient not taking: Reported on 01/30/2021), Disp: 30 tablet, Rfl: 6  Physical exam:  Vitals:  08/10/21 1339  BP: (!) 145/68  Pulse: 80  Resp: 20  Temp: 99 F (37.2 C)  SpO2: 97%  Weight: 293 lb 3.2 oz (133 kg)   Physical Exam Cardiovascular:     Rate and Rhythm: Normal rate and regular rhythm.     Heart sounds: Normal heart sounds.  Pulmonary:     Effort: Pulmonary effort is normal.     Comments: Mild scattered bilateral wheezing Abdominal:     General: Bowel sounds are normal.     Palpations: Abdomen is soft.  Skin:    General: Skin is warm and dry.  Neurological:     Mental Status: She is alert and oriented to person, place, and time.     CMP Latest Ref Rng & Units 03/26/2021  Glucose 70 - 99 mg/dL 113(H)  BUN 8 - 23 mg/dL 19  Creatinine 0.44 - 1.00 mg/dL 1.16(H)  Sodium 135 - 145 mmol/L 140  Potassium 3.5 - 5.1 mmol/L 3.8  Chloride 98 - 111 mmol/L 108  CO2 22 - 32 mmol/L 22  Calcium 8.9 - 10.3 mg/dL 8.5(L)  Total Protein 6.5 - 8.1 g/dL 6.4(L)  Total Bilirubin 0.3 - 1.2 mg/dL 0.5  Alkaline Phos 38 - 126 U/L 51  AST 15 - 41 U/L 16  ALT 0 - 44 U/L 21   CBC Latest Ref Rng & Units 08/10/2021  WBC 4.0 - 10.5 K/uL 8.5  Hemoglobin 12.0 - 15.0 g/dL 11.4(L)  Hematocrit 36.0 - 46.0 % 35.2(L)  Platelets 150 - 400 K/uL 159     Assessment and plan- Patient is a 71 y.o. female here for routine follow-up of ITP  Patient's platelet counts have been  typically fluctuating between 50s to 60s up until February 2022.Then she was noted to have a platelet count of 93 in August 2022 and 112 in October 2022.  Presently her platelet count is 159 which is within normal limits.  It is unclear if her ITP has gone into a spontaneous remission at this time.  There was a time when her platelets have dropped down to 19 back in 2017 when she had received steroids.  I will plan to repeat her CBC in 3 in 6 months and I will see her back in 6 months  Patient is mildly anemic with hemoglobin of 11.4.  Ferritin levels are normal at 62 B12 and folate are normal.  TIBC is normal although iron saturation is low at 9%.  We will ask her to take oral iron at this time.   Visit Diagnosis 1. Chronic ITP (idiopathic thrombocytopenia) (HCC)      Dr. Randa Evens, MD, MPH [facility] at [facility] [phone] 08/12/2021 7:25 PM

## 2021-08-13 ENCOUNTER — Encounter: Payer: Self-pay | Admitting: Physical Therapy

## 2021-08-13 ENCOUNTER — Ambulatory Visit: Payer: Medicare Other | Admitting: Physical Therapy

## 2021-08-13 DIAGNOSIS — R269 Unspecified abnormalities of gait and mobility: Secondary | ICD-10-CM

## 2021-08-13 DIAGNOSIS — R2681 Unsteadiness on feet: Secondary | ICD-10-CM

## 2021-08-13 DIAGNOSIS — R262 Difficulty in walking, not elsewhere classified: Secondary | ICD-10-CM

## 2021-08-13 DIAGNOSIS — R278 Other lack of coordination: Secondary | ICD-10-CM

## 2021-08-13 DIAGNOSIS — M6281 Muscle weakness (generalized): Secondary | ICD-10-CM

## 2021-08-13 DIAGNOSIS — R2689 Other abnormalities of gait and mobility: Secondary | ICD-10-CM

## 2021-08-13 NOTE — Therapy (Signed)
[place] ?[facility] ?[place], [place], [address] ?Phone: [phone]   Fax:  [phone] ? ?August 13, 2021  ? ?No Recipients ? ?Physical Therapy Discharge Summary ? ?Patient: Leslie Smith  ?MRN: [id number]  ?Date of Birth: [birth year]  ? ?Diagnosis: Other abnormalities of gait and mobility ? ?Other lack of coordination ? ?Difficulty in walking, not elsewhere classified ? ?Muscle weakness (generalized) ? ?Unsteadiness on feet ? ?Abnormality of gait and mobility ?Referring Provider (PT): Dr. Darlin Priestly ? ? ?The above patient had been seen in Physical Therapy 51 times of 72 treatments scheduled with 0 no shows and 10 cancellations. ? ?The treatment consisted of LE strengthening,balance training, gait training, functional mobility;  ?The patient is: Improved ? ?Subjective: Patient missed last appointment on 08/13/21. She has been sick last several days. She also has had a lot going on with her bridge falling out and trying to get dental insurance. She is supposed to go to [place] and stay for several weeks for vacation. Due to recent illness, upcoming travel and other personal issues, patient requested to stop therapy at this time. She will call and let us know when she is ready to resume.  ? ?Discharge Findings: patient failed to return to last session. However goals were recently updated on 08/01/21 ? ? ? ?Goals Partially Met ? ? ? ?Sincerely, ? ? ?Paisyn Guercio, PT, DPT ? ? ?CC ?No Recipients ? ?[place] ?[facility] ?[place], [place], [address] ?Phone: [phone]   Fax:  [phone] ? ?Patient: Leslie Smith  ?MRN: [id number]  ?Date of Birth: [birth year]  ? ? ? ?

## 2021-08-16 ENCOUNTER — Ambulatory Visit: Payer: Medicare Other | Admitting: Physical Therapy

## 2021-08-28 ENCOUNTER — Ambulatory Visit: Payer: Medicare Other | Admitting: Physical Therapy

## 2021-08-30 ENCOUNTER — Ambulatory Visit: Payer: Medicare Other | Admitting: Physical Therapy

## 2021-09-04 ENCOUNTER — Ambulatory Visit: Payer: Medicare Other | Admitting: Physical Therapy

## 2021-09-06 ENCOUNTER — Ambulatory Visit: Payer: Medicare Other | Admitting: Physical Therapy

## 2021-09-11 ENCOUNTER — Ambulatory Visit: Payer: Medicare Other | Admitting: Physical Therapy

## 2021-09-13 ENCOUNTER — Ambulatory Visit: Payer: Medicare Other | Admitting: Physical Therapy

## 2021-09-18 ENCOUNTER — Ambulatory Visit: Payer: Medicare Other | Admitting: Physical Therapy

## 2021-09-20 ENCOUNTER — Ambulatory Visit: Payer: Medicare Other | Admitting: Physical Therapy

## 2021-09-24 ENCOUNTER — Ambulatory Visit: Payer: Medicare Other | Admitting: Physical Therapy

## 2021-09-26 ENCOUNTER — Ambulatory Visit: Payer: Medicare Other | Admitting: Physical Therapy

## 2021-10-01 ENCOUNTER — Ambulatory Visit: Payer: Medicare Other | Admitting: Physical Therapy

## 2021-10-03 ENCOUNTER — Ambulatory Visit: Payer: Medicare Other | Admitting: Physical Therapy

## 2021-10-09 ENCOUNTER — Other Ambulatory Visit: Payer: Self-pay | Admitting: Internal Medicine

## 2021-10-09 DIAGNOSIS — Z1231 Encounter for screening mammogram for malignant neoplasm of breast: Secondary | ICD-10-CM

## 2021-11-12 ENCOUNTER — Inpatient Hospital Stay: Payer: Medicare Other | Attending: Oncology

## 2021-11-12 DIAGNOSIS — D693 Immune thrombocytopenic purpura: Secondary | ICD-10-CM | POA: Diagnosis present

## 2021-11-12 LAB — CBC WITH DIFFERENTIAL/PLATELET
Abs Immature Granulocytes: 0.03 10*3/uL (ref 0.00–0.07)
Basophils Absolute: 0.1 10*3/uL (ref 0.0–0.1)
Basophils Relative: 1 %
Eosinophils Absolute: 0.4 10*3/uL (ref 0.0–0.5)
Eosinophils Relative: 4 %
HCT: 32.3 % — ABNORMAL LOW (ref 36.0–46.0)
Hemoglobin: 10.5 g/dL — ABNORMAL LOW (ref 12.0–15.0)
Immature Granulocytes: 0 %
Lymphocytes Relative: 19 %
Lymphs Abs: 1.8 10*3/uL (ref 0.7–4.0)
MCH: 29.4 pg (ref 26.0–34.0)
MCHC: 32.5 g/dL (ref 30.0–36.0)
MCV: 90.5 fL (ref 80.0–100.0)
Monocytes Absolute: 0.6 10*3/uL (ref 0.1–1.0)
Monocytes Relative: 7 %
Neutro Abs: 6.3 10*3/uL (ref 1.7–7.7)
Neutrophils Relative %: 69 %
Platelets: 115 10*3/uL — ABNORMAL LOW (ref 150–400)
RBC: 3.57 MIL/uL — ABNORMAL LOW (ref 3.87–5.11)
RDW: 14.3 % (ref 11.5–15.5)
WBC: 9.2 10*3/uL (ref 4.0–10.5)
nRBC: 0 % (ref 0.0–0.2)

## 2021-11-20 ENCOUNTER — Other Ambulatory Visit: Payer: Self-pay | Admitting: Orthopedic Surgery

## 2021-11-20 DIAGNOSIS — Z01818 Encounter for other preprocedural examination: Secondary | ICD-10-CM

## 2021-11-21 ENCOUNTER — Ambulatory Visit: Payer: Medicare Other

## 2021-11-21 ENCOUNTER — Encounter
Admission: RE | Disposition: A | Discharge status: Home Health | Payer: Self-pay | Source: Home / Self Care | Attending: Orthopedic Surgery

## 2021-11-21 ENCOUNTER — Encounter: Payer: Self-pay | Admitting: Orthopedic Surgery

## 2021-11-21 ENCOUNTER — Other Ambulatory Visit: Payer: Self-pay

## 2021-11-21 ENCOUNTER — Observation Stay: Payer: Medicare Other

## 2021-11-21 ENCOUNTER — Observation Stay
Admission: RE | Admit: 2021-11-21 | Discharge: 2021-11-22 | Disposition: A | Discharge status: Home Health | Payer: Medicare Other | Attending: Orthopedic Surgery | Admitting: Orthopedic Surgery

## 2021-11-21 DIAGNOSIS — M1711 Unilateral primary osteoarthritis, right knee: Principal | ICD-10-CM | POA: Insufficient documentation

## 2021-11-21 DIAGNOSIS — I10 Essential (primary) hypertension: Secondary | ICD-10-CM | POA: Diagnosis not present

## 2021-11-21 DIAGNOSIS — Z85841 Personal history of malignant neoplasm of brain: Secondary | ICD-10-CM | POA: Insufficient documentation

## 2021-11-21 DIAGNOSIS — Z01818 Encounter for other preprocedural examination: Secondary | ICD-10-CM

## 2021-11-21 DIAGNOSIS — Z96651 Presence of right artificial knee joint: Secondary | ICD-10-CM

## 2021-11-21 DIAGNOSIS — M170 Bilateral primary osteoarthritis of knee: Secondary | ICD-10-CM

## 2021-11-21 HISTORY — PX: TOTAL KNEE ARTHROPLASTY: SHX125

## 2021-11-21 LAB — URINALYSIS, ROUTINE W REFLEX MICROSCOPIC
Bacteria, UA: NONE SEEN
Bilirubin Urine: NEGATIVE
Glucose, UA: NEGATIVE mg/dL
Hgb urine dipstick: NEGATIVE
Ketones, ur: NEGATIVE mg/dL
Leukocytes,Ua: NEGATIVE
Nitrite: NEGATIVE
Protein, ur: 100 mg/dL — AB
Specific Gravity, Urine: 1.014 (ref 1.005–1.030)
pH: 6 (ref 5.0–8.0)

## 2021-11-21 LAB — ABO/RH: ABO/RH(D): A POS

## 2021-11-21 LAB — BASIC METABOLIC PANEL
Anion gap: 8 (ref 5–15)
BUN: 26 mg/dL — ABNORMAL HIGH (ref 8–23)
CO2: 25 mmol/L (ref 22–32)
Calcium: 9.3 mg/dL (ref 8.9–10.3)
Chloride: 107 mmol/L (ref 98–111)
Creatinine, Ser: 1.3 mg/dL — ABNORMAL HIGH (ref 0.44–1.00)
GFR, Estimated: 44 mL/min — ABNORMAL LOW (ref >60)
Glucose, Bld: 92 mg/dL (ref 70–99)
Potassium: 4 mmol/L (ref 3.5–5.1)
Sodium: 140 mmol/L (ref 135–145)

## 2021-11-21 LAB — CBC
HCT: 33.1 % — ABNORMAL LOW (ref 36.0–46.0)
Hemoglobin: 10.8 g/dL — ABNORMAL LOW (ref 12.0–15.0)
MCH: 29.2 pg (ref 26.0–34.0)
MCHC: 32.6 g/dL (ref 30.0–36.0)
MCV: 89.5 fL (ref 80.0–100.0)
Platelets: 101 10*3/uL — ABNORMAL LOW (ref 150–400)
RBC: 3.7 MIL/uL — ABNORMAL LOW (ref 3.87–5.11)
RDW: 14.3 % (ref 11.5–15.5)
WBC: 8.9 10*3/uL (ref 4.0–10.5)
nRBC: 0 % (ref 0.0–0.2)

## 2021-11-21 LAB — TYPE AND SCREEN
ABO/RH(D): A POS
Antibody Screen: NEGATIVE

## 2021-11-21 SURGERY — ARTHROPLASTY, KNEE, TOTAL
Anesthesia: Regional | Site: Knee | Laterality: Right

## 2021-11-21 MED ORDER — KETOROLAC TROMETHAMINE 15 MG/ML IJ SOLN
INTRAMUSCULAR | Status: AC
  Administered 2021-11-21: 7.5 mg via INTRAVENOUS
  Filled 2021-11-21: qty 1

## 2021-11-21 MED ORDER — BUPIVACAINE HCL (PF) 0.5 % IJ SOLN
INTRAMUSCULAR | Status: DC | PRN
  Administered 2021-11-21: 20 mL

## 2021-11-21 MED ORDER — ACETAMINOPHEN 10 MG/ML IV SOLN
INTRAVENOUS | Status: AC
  Filled 2021-11-21: qty 100

## 2021-11-21 MED ORDER — LACTATED RINGERS IV SOLN: INTRAVENOUS | Status: DC

## 2021-11-21 MED ORDER — BUPIVACAINE HCL (PF) 0.5 % IJ SOLN
INTRAMUSCULAR | Status: AC
  Filled 2021-11-21: qty 20

## 2021-11-21 MED ORDER — OXYCODONE HCL 5 MG/5ML PO SOLN: 5.0000 mg | Freq: Once | ORAL | Status: AC | PRN

## 2021-11-21 MED ORDER — FENTANYL CITRATE (PF) 100 MCG/2ML IJ SOLN
25.0000 ug | INTRAMUSCULAR | Status: DC | PRN
  Administered 2021-11-21: 50 ug via INTRAVENOUS

## 2021-11-21 MED ORDER — POVIDONE-IODINE 10 % EX SWAB
2.0000 "application " | Freq: Once | CUTANEOUS | Status: AC
  Administered 2021-11-21: 2 via TOPICAL

## 2021-11-21 MED ORDER — CHLORHEXIDINE GLUCONATE 0.12 % MT SOLN: 15.0000 mL | Freq: Once | OROMUCOSAL | Status: AC

## 2021-11-21 MED ORDER — TRANEXAMIC ACID-NACL 1000-0.7 MG/100ML-% IV SOLN
1000.0000 mg | INTRAVENOUS | Status: AC
  Administered 2021-11-21: 1000 mg via INTRAVENOUS

## 2021-11-21 MED ORDER — ONDANSETRON HCL 4 MG/2ML IJ SOLN
4.0000 mg | Freq: Once | INTRAMUSCULAR | Status: DC | PRN
Start: 2021-11-21 — End: 2021-11-21

## 2021-11-21 MED ORDER — SURGIPHOR WOUND IRRIGATION SYSTEM - OPTIME
TOPICAL | Status: DC | PRN
  Administered 2021-11-21: 400 mL

## 2021-11-21 MED ORDER — PROPOFOL 10 MG/ML IV BOLUS
INTRAVENOUS | Status: DC | PRN
  Administered 2021-11-21: 140 mg via INTRAVENOUS

## 2021-11-21 MED ORDER — FENTANYL CITRATE PF 50 MCG/ML IJ SOSY
PREFILLED_SYRINGE | INTRAMUSCULAR | Status: AC
  Administered 2021-11-21: 50 ug via INTRAVENOUS
  Filled 2021-11-21: qty 1

## 2021-11-21 MED ORDER — KETOROLAC TROMETHAMINE 15 MG/ML IJ SOLN
7.5000 mg | Freq: Four times a day (QID) | INTRAMUSCULAR | Status: DC
  Administered 2021-11-21 – 2021-11-22 (×2): 7.5 mg via INTRAVENOUS
  Filled 2021-11-21 (×2): qty 1

## 2021-11-21 MED ORDER — ROCURONIUM BROMIDE 10 MG/ML (PF) SYRINGE
PREFILLED_SYRINGE | INTRAVENOUS | Status: AC
  Filled 2021-11-21: qty 10

## 2021-11-21 MED ORDER — OXYCODONE HCL 5 MG PO TABS
ORAL_TABLET | ORAL | Status: AC
  Filled 2021-11-21: qty 1

## 2021-11-21 MED ORDER — PROPOFOL 500 MG/50ML IV EMUL
INTRAVENOUS | Status: DC | PRN
  Administered 2021-11-21: 20 ug/kg/min via INTRAVENOUS

## 2021-11-21 MED ORDER — BUPIVACAINE-EPINEPHRINE (PF) 0.5% -1:200000 IJ SOLN
INTRAMUSCULAR | Status: AC
  Filled 2021-11-21: qty 30

## 2021-11-21 MED ORDER — CEFAZOLIN SODIUM-DEXTROSE 2-4 GM/100ML-% IV SOLN
INTRAVENOUS | Status: AC
  Filled 2021-11-21: qty 100

## 2021-11-21 MED ORDER — SUGAMMADEX SODIUM 200 MG/2ML IV SOLN
INTRAVENOUS | Status: DC | PRN
  Administered 2021-11-21: 300 mg via INTRAVENOUS

## 2021-11-21 MED ORDER — DEXAMETHASONE SODIUM PHOSPHATE 10 MG/ML IJ SOLN
INTRAMUSCULAR | Status: AC
  Filled 2021-11-21: qty 1

## 2021-11-21 MED ORDER — FENTANYL CITRATE (PF) 100 MCG/2ML IJ SOLN
INTRAMUSCULAR | Status: AC
  Administered 2021-11-21: 50 ug via INTRAVENOUS
  Filled 2021-11-21: qty 2

## 2021-11-21 MED ORDER — FENTANYL CITRATE (PF) 100 MCG/2ML IJ SOLN
INTRAMUSCULAR | Status: AC
  Filled 2021-11-21: qty 2

## 2021-11-21 MED ORDER — ORAL CARE MOUTH RINSE
15.0000 mL | Freq: Once | OROMUCOSAL | Status: AC
Start: 2021-11-21 — End: 2021-11-21

## 2021-11-21 MED ORDER — CEFAZOLIN SODIUM 1 G IJ SOLR
INTRAMUSCULAR | Status: AC
  Filled 2021-11-21: qty 10

## 2021-11-21 MED ORDER — DEXAMETHASONE SODIUM PHOSPHATE 10 MG/ML IJ SOLN
INTRAMUSCULAR | Status: DC | PRN
  Administered 2021-11-21: 10 mg via INTRAVENOUS

## 2021-11-21 MED ORDER — CEFAZOLIN SODIUM-DEXTROSE 2-4 GM/100ML-% IV SOLN
2.0000 g | INTRAVENOUS | Status: AC
  Administered 2021-11-21: 3 g via INTRAVENOUS

## 2021-11-21 MED ORDER — ACETAMINOPHEN 10 MG/ML IV SOLN: 1000.0000 mg | Freq: Once | INTRAVENOUS | Status: DC | PRN

## 2021-11-21 MED ORDER — SODIUM CHLORIDE 0.9 % IR SOLN
Status: DC | PRN
  Administered 2021-11-21: 3000 mL

## 2021-11-21 MED ORDER — GLYCOPYRROLATE 0.2 MG/ML IJ SOLN
INTRAMUSCULAR | Status: DC | PRN
  Administered 2021-11-21: .2 mg via INTRAVENOUS

## 2021-11-21 MED ORDER — ACETAMINOPHEN 10 MG/ML IV SOLN
INTRAVENOUS | Status: DC | PRN
  Administered 2021-11-21: 1000 mg via INTRAVENOUS

## 2021-11-21 MED ORDER — OXYCODONE HCL 5 MG PO TABS
5.0000 mg | ORAL_TABLET | Freq: Once | ORAL | Status: AC
  Administered 2021-11-21: 5 mg via ORAL

## 2021-11-21 MED ORDER — ONDANSETRON HCL 4 MG/2ML IJ SOLN
INTRAMUSCULAR | Status: AC
  Filled 2021-11-21: qty 2

## 2021-11-21 MED ORDER — BUPIVACAINE LIPOSOME 1.3 % IJ SUSP
INTRAMUSCULAR | Status: AC
  Filled 2021-11-21: qty 20

## 2021-11-21 MED ORDER — ONDANSETRON HCL 4 MG/2ML IJ SOLN
INTRAMUSCULAR | Status: DC | PRN
  Administered 2021-11-21: 4 mg via INTRAVENOUS

## 2021-11-21 MED ORDER — ALBUTEROL SULFATE HFA 108 (90 BASE) MCG/ACT IN AERS
INHALATION_SPRAY | RESPIRATORY_TRACT | Status: DC | PRN
  Administered 2021-11-21: 4 via RESPIRATORY_TRACT
  Administered 2021-11-21: 2 via RESPIRATORY_TRACT

## 2021-11-21 MED ORDER — ROCURONIUM BROMIDE 100 MG/10ML IV SOLN
INTRAVENOUS | Status: DC | PRN
  Administered 2021-11-21: 20 mg via INTRAVENOUS
  Administered 2021-11-21: 60 mg via INTRAVENOUS
  Administered 2021-11-21: 20 mg via INTRAVENOUS

## 2021-11-21 MED ORDER — 0.9 % SODIUM CHLORIDE (POUR BTL) OPTIME
TOPICAL | Status: DC | PRN
  Administered 2021-11-21: 1000 mL

## 2021-11-21 MED ORDER — STERILE WATER FOR IRRIGATION IR SOLN
Status: DC | PRN
  Administered 2021-11-21: 1000 mL

## 2021-11-21 MED ORDER — FENTANYL CITRATE (PF) 100 MCG/2ML IJ SOLN
INTRAMUSCULAR | Status: DC | PRN
  Administered 2021-11-21 (×2): 50 ug via INTRAVENOUS

## 2021-11-21 MED ORDER — LIDOCAINE HCL (PF) 2 % IJ SOLN
INTRAMUSCULAR | Status: AC
  Filled 2021-11-21: qty 5

## 2021-11-21 MED ORDER — CHLORHEXIDINE GLUCONATE 0.12 % MT SOLN
OROMUCOSAL | Status: AC
  Administered 2021-11-21: 15 mL via OROMUCOSAL
  Filled 2021-11-21: qty 15

## 2021-11-21 MED ORDER — OXYCODONE HCL 5 MG PO TABS
5.0000 mg | ORAL_TABLET | Freq: Once | ORAL | Status: AC | PRN
  Administered 2021-11-21: 5 mg via ORAL

## 2021-11-21 MED ORDER — FENTANYL CITRATE PF 50 MCG/ML IJ SOSY
50.0000 ug | PREFILLED_SYRINGE | INTRAMUSCULAR | Status: AC | PRN
  Administered 2021-11-21: 50 ug via INTRAVENOUS
  Filled 2021-11-21: qty 1

## 2021-11-21 MED ORDER — SODIUM CHLORIDE (PF) 0.9 % IJ SOLN
INTRAMUSCULAR | Status: DC | PRN
  Administered 2021-11-21: 90 mL via INTRAMUSCULAR

## 2021-11-21 MED ORDER — TRANEXAMIC ACID-NACL 1000-0.7 MG/100ML-% IV SOLN
INTRAVENOUS | Status: AC
  Filled 2021-11-21: qty 100

## 2021-11-21 MED ORDER — GLYCOPYRROLATE 0.2 MG/ML IJ SOLN
INTRAMUSCULAR | Status: AC
  Filled 2021-11-21: qty 1

## 2021-11-21 SURGICAL SUPPLY — 65 items
BASEPLATE TIBIAL RT SZ 5 (Knees) IMPLANT
BLADE SAGITTAL AGGR TOOTH XLG (BLADE) ×2 IMPLANT
BLADE SAW SAG 25X90X1.19 (BLADE) ×2 IMPLANT
BOWL CEMENT MIX W/ADAPTER (MISCELLANEOUS) ×2 IMPLANT
BRUSH SCRUB EZ  4% CHG (MISCELLANEOUS) ×1
BRUSH SCRUB EZ 4% CHG (MISCELLANEOUS) ×1 IMPLANT
CEMENT BONE 40GM (Cement) ×4 IMPLANT
CHLORAPREP W/TINT 26 (MISCELLANEOUS) ×4 IMPLANT
COMP PATELLA GENESIS 35MM (Stem) ×2 IMPLANT
COMPONENT FEM OXINIUM RT 6N (Orthopedic Implant) ×1 IMPLANT
COMPONENT PATELLA GENESIS 35MM (Stem) IMPLANT
COOLER POLAR GLACIER W/PUMP (MISCELLANEOUS) ×2 IMPLANT
CUFF TOURN SGL QUICK 34 (TOURNIQUET CUFF) ×1
CUFF TRNQT CYL 34X4.125X (TOURNIQUET CUFF) ×1 IMPLANT
DRAPE 3/4 80X56 (DRAPES) ×4 IMPLANT
DRAPE INCISE IOBAN 66X60 STRL (DRAPES) ×2 IMPLANT
DRAPE POUCH INSTRU U-SHP 10X18 (DRAPES) ×1 IMPLANT
ELECT REM PT RETURN 9FT ADLT (ELECTROSURGICAL) ×2
ELECTRODE REM PT RTRN 9FT ADLT (ELECTROSURGICAL) ×1 IMPLANT
GAUZE SPONGE 4X4 12PLY STRL (GAUZE/BANDAGES/DRESSINGS) ×2 IMPLANT
GAUZE XEROFORM 1X8 LF (GAUZE/BANDAGES/DRESSINGS) ×2 IMPLANT
GLOVE BIO SURGEON STRL SZ8 (GLOVE) ×2 IMPLANT
GLOVE BIOGEL PI IND STRL 8.5 (GLOVE) ×2 IMPLANT
GLOVE BIOGEL PI INDICATOR 8.5 (GLOVE) ×2
GLOVE SURG ORTHO 8.5 STRL (GLOVE) ×2 IMPLANT
GOWN STRL REUS W/ TWL LRG LVL3 (GOWN DISPOSABLE) ×1 IMPLANT
GOWN STRL REUS W/ TWL XL LVL3 (GOWN DISPOSABLE) ×1 IMPLANT
GOWN STRL REUS W/TWL LRG LVL3 (GOWN DISPOSABLE) ×1
GOWN STRL REUS W/TWL XL LVL3 (GOWN DISPOSABLE) ×1
HOOD PEEL AWAY FLYTE STAYCOOL (MISCELLANEOUS) ×4 IMPLANT
INSERT TIB XLPE 11 SZ5-6 (Insert) ×1 IMPLANT
IV NS IRRIG 3000ML ARTHROMATIC (IV SOLUTION) ×2 IMPLANT
KIT TURNOVER KIT A (KITS) ×2 IMPLANT
MANIFOLD NEPTUNE II (INSTRUMENTS) ×2 IMPLANT
MAT ABSORB  FLUID 56X50 GRAY (MISCELLANEOUS) ×1
MAT ABSORB FLUID 56X50 GRAY (MISCELLANEOUS) ×1 IMPLANT
NDL SAFETY ECLIPSE 18X1.5 (NEEDLE) ×1 IMPLANT
NDL SPNL 20GX3.5 QUINCKE YW (NEEDLE) ×1 IMPLANT
NEEDLE HYPO 18GX1.5 SHARP (NEEDLE) ×1
NEEDLE SPNL 20GX3.5 QUINCKE YW (NEEDLE) ×2 IMPLANT
NS IRRIG 1000ML POUR BTL (IV SOLUTION) ×2 IMPLANT
PACK TOTAL KNEE (MISCELLANEOUS) ×2 IMPLANT
PAD ABD DERMACEA PRESS 5X9 (GAUZE/BANDAGES/DRESSINGS) ×1 IMPLANT
PAD CAST CTTN 4X4 STRL (SOFTGOODS) IMPLANT
PAD DE MAYO PRESSURE PROTECT (MISCELLANEOUS) ×2 IMPLANT
PAD WRAPON POLAR KNEE (MISCELLANEOUS) ×1 IMPLANT
PADDING CAST COTTON 4X4 STRL (SOFTGOODS) ×1
PULSAVAC PLUS IRRIG FAN TIP (DISPOSABLE) ×2
SOLUTION IRRIG SURGIPHOR (IV SOLUTION) ×2 IMPLANT
SOLUTION PRONTOSAN WOUND 350ML (IRRIGATION / IRRIGATOR) ×2 IMPLANT
STAPLER SKIN PROX 35W (STAPLE) ×3 IMPLANT
STOCKINETTE BIAS CUT 6 980064 (GAUZE/BANDAGES/DRESSINGS) ×1 IMPLANT
SUCTION FRAZIER HANDLE 10FR (MISCELLANEOUS) ×1
SUCTION TUBE FRAZIER 10FR DISP (MISCELLANEOUS) ×1 IMPLANT
SUT DVC 2 QUILL PDO  T11 36X36 (SUTURE) ×1
SUT DVC 2 QUILL PDO T11 36X36 (SUTURE) ×1 IMPLANT
SUT VIC AB 2-0 CT1 18 (SUTURE) ×2 IMPLANT
SUT VIC AB 2-0 CT1 27 (SUTURE)
SUT VIC AB 2-0 CT1 TAPERPNT 27 (SUTURE) IMPLANT
SUT VIC AB PLUS 45CM 1-MO-4 (SUTURE) ×2 IMPLANT
SYR 30ML LL (SYRINGE) ×6 IMPLANT
TIBIAL BASEPLATE SZ 5 (Knees) ×2 IMPLANT
TIP FAN IRRIG PULSAVAC PLUS (DISPOSABLE) ×1 IMPLANT
WATER STERILE IRR 500ML POUR (IV SOLUTION) ×2 IMPLANT
WRAPON POLAR PAD KNEE (MISCELLANEOUS) ×2

## 2021-11-21 NOTE — Anesthesia Preprocedure Evaluation (Addendum)
Anesthesia Evaluation  Patient identified by MRN, date of birth, ID band Patient awake    Reviewed: Allergy & Precautions, NPO status , Patient's Chart, lab work & pertinent test results  History of Anesthesia Complications (+) PONV and history of anesthetic complications  Airway Mallampati: II   Neck ROM: Full    Dental  (+) Partial Upper   Pulmonary neg pulmonary ROS,    Pulmonary exam normal breath sounds clear to auscultation       Cardiovascular hypertension, Normal cardiovascular exam Rhythm:Regular Rate:Normal  Echo 11/23/20:  NORMAL LEFT VENTRICULAR SYSTOLIC FUNCTION  WITH MILD LVH  NORMAL RIGHT VENTRICULAR SYSTOLIC FUNCTION  TRIVIAL REGURGITATION NOTED  NO VALVULAR STENOSIS  ESTIMATED LVEF >55%  TRIVIAL MR  TRIVIAL TR    Neuro/Psych Brain tumor s/p resection 2015  Neuromuscular disease (multiple sclerosis)    GI/Hepatic negative GI ROS,   Endo/Other  Class 3 obesity  Renal/GU negative Renal ROS     Musculoskeletal  (+) Arthritis ,   Abdominal   Peds  Hematology negative hematology ROS (+)   Anesthesia Other Findings   Reproductive/Obstetrics                            Anesthesia Physical Anesthesia Plan  ASA: 3  Anesthesia Plan: General and Regional   Post-op Pain Management: Regional block*   Induction: Intravenous  PONV Risk Score and Plan: 4 or greater and Ondansetron, Dexamethasone and Treatment may vary due to age or medical condition  Airway Management Planned: Oral ETT  Additional Equipment:   Intra-op Plan:   Post-operative Plan: Extubation in OR  Informed Consent: I have reviewed the patients History and Physical, chart, labs and discussed the procedure including the risks, benefits and alternatives for the proposed anesthesia with the patient or authorized representative who has indicated his/her understanding and acceptance.     Dental advisory  given  Plan Discussed with: CRNA  Anesthesia Plan Comments: (Anesthetic considerations for multiple sclerosis: closely monitor body temperature to avoid increase above baseline, avoid succinylcholine, pt may have altered sensitivity to NDMRs, avoid spinal anesthesia.  Plan for preoperative adductor saphenous nerve block and GETA.  Patient consented for risks of anesthesia including but not limited to:  - adverse reactions to medications - damage to eyes, teeth, lips or other oral mucosa - nerve damage due to positioning  - sore throat or hoarseness - damage to heart, brain, nerves, lungs, other parts of body or loss of life  Informed patient about role of CRNA in peri- and intra-operative care.  Patient voiced understanding.)        Anesthesia Quick Evaluation

## 2021-11-21 NOTE — Anesthesia Procedure Notes (Signed)
Procedure Name: Intubation Date/Time: 11/21/2021 2:31 PM  Performed by: Lowry Bowl, CRNAPre-anesthesia Checklist: Patient identified, Emergency Drugs available, Suction available and Patient being monitored Patient Re-evaluated:Patient Re-evaluated prior to induction Oxygen Delivery Method: Circle system utilized Preoxygenation: Pre-oxygenation with 100% oxygen Induction Type: IV induction Ventilation: Mask ventilation without difficulty Laryngoscope Size: Mac and 4 Grade View: Grade II Tube type: Oral Tube size: 7.0 mm Number of attempts: 1 Airway Equipment and Method: Stylet and Oral airway Placement Confirmation: ETT inserted through vocal cords under direct vision, positive ETCO2 and breath sounds checked- equal and bilateral Secured at: 21 cm Tube secured with: Tape Dental Injury: Teeth and Oropharynx as per pre-operative assessment  Comments: Intubated by Everlene Other SRNA

## 2021-11-21 NOTE — Anesthesia Postprocedure Evaluation (Signed)
Anesthesia Post Note  Patient: Leslie Smith  Procedure(s) Performed: TOTAL KNEE ARTHROPLASTY (Right: Knee)  Patient location during evaluation: PACU Anesthesia Type: Regional and General Level of consciousness: awake and alert Pain management: pain level controlled Vital Signs Assessment: post-procedure vital signs reviewed and stable Respiratory status: spontaneous breathing, nonlabored ventilation, respiratory function stable and patient connected to nasal cannula oxygen Cardiovascular status: blood pressure returned to baseline and stable Postop Assessment: no apparent nausea or vomiting Anesthetic complications: no   No notable events documented.   Last Vitals:  Vitals:   11/21/21 1900 11/21/21 1943  BP: (!) 157/54 (!) 148/50  Pulse: 63 65  Resp: 14 17  Temp: 37.1 C 36.8 C  SpO2: 96% 96%    Last Pain:  Vitals:   11/21/21 1900  TempSrc:   PainSc: 4                  Arita Miss

## 2021-11-21 NOTE — H&P (Signed)
The patient has been re-examined, and the chart reviewed, and there have been no interval changes to the documented history and physical.  Plan a right total knee today.  Anesthesia is consulted regarding a peripheral nerve block for post-operative pain.  The risks, benefits, and alternatives have been discussed at length, and the patient is willing to proceed.     

## 2021-11-21 NOTE — Op Note (Signed)
DATE OF SURGERY:  11/21/2021 TIME: 4:05 PM  PATIENT NAME:  Leslie Smith   AGE: 71 y.o.    PRE-OPERATIVE DIAGNOSIS:  M17.11 Unilateral primary osteoarthritis, right knee  POST-OPERATIVE DIAGNOSIS:  Same  PROCEDURE:  Procedure(s): TOTAL KNEE ARTHROPLASTY, RIGHT  SURGEON:  Lovell Sheehan, MD   ASSISTANT:  [name],  PA-C  OPERATIVE IMPLANTS: Tamala [employer], Cruciate Retaining Oxinium Femoral component size 6 narrow, Fixed Bearing Tray size 5, Patella polyethylene 3-peg oval button size 35 mm, with a 11 mm DISH insert.   PREOPERATIVE INDICATIONS:  Leslie Smith is an 71 y.o. female who has a diagnosis of M17.11 Unilateral primary osteoarthritis, right knee and elected for a total knee arthroplasty after failing nonoperative treatment, including activity modification, pain medication, physical therapy and injections who has significant impairment of their activities of daily living.  Radiographs have demonstrated tricompartmental osteoarthritis joint space narrowing, osteophytes, subchondral sclerosis and cyst formation.  The risks, benefits, and alternatives were discussed at length including but not limited to the risks of infection, bleeding, nerve or blood vessel injury, knee stiffness, fracture, dislocation, loosening or failure of the hardware and the need for further surgery. Medical risks include but not limited to DVT and pulmonary embolism, myocardial infarction, stroke, pneumonia, respiratory failure and death. I discussed these risks with the patient in my office prior to the date of surgery. They understood these risks and were willing to proceed.  OPERATIVE FINDINGS AND UNIQUE ASPECTS OF THE CASE:  All three compartments with advanced and severe degenerative changes, large osteophytes and an abundance of synovial fluid. Significant deformity was also noted. A decision was made to proceed with total knee arthroplasty.   OPERATIVE DESCRIPTION:  The patient was brought to  the operative room and placed in a supine position after undergoing placement of a general anesthetic. IV antibiotics were given. Patient received tranexamic acid. The lower extremity was prepped and draped in the usual sterile fashion.  A time out was performed to verify the patient's name, date of birth, medical record number, correct site of surgery and correct procedure to be performed. The timeout was also used to confirm the patient received antibiotics and that appropriate instruments, implants and radiographs studies were available in the room.  The leg was elevated and exsanguinated with an Esmarch and the tourniquet was inflated to 275 mmHg.  A midline incision was made over the left knee.. A medial parapatellar arthrotomy was then made and the patella subluxed laterally and the knee was brought into 90 of flexion. Hoffa's fat pad along with the anterior cruciate ligament was resected and the medial joint line was exposed.  Attention was then turned to preparation of the patella. The thickness of the patella was measured with a caliper, the diameter measured with the patella templates.  The patella resection was then made with an oscillating saw using the patella cutting guide.  The 35 mm button fit appropriately.  3 peg holes for the patella component were then drilled.  The extramedullary tibial cutting guide was then placed using the anterior tibial crest and second ray of the foot as a reference.  The tibial cutting guide was adjusted to allow for appropriate posterior slope.  The tibial cutting block was pinned into position. The slotted stylus was used to measure the proximal tibial resection of 9 mm off the high lateral side. Care was taken during the tibial resection to protect the medial and collateral ligaments.  The resected tibial bone was removed.  The distal femur was resected using the intramedullary cutting guide.  Care was taken to protect the collateral ligaments during distal  femoral resection.  The distal femoral resection was performed with an oscillating saw. The femoral cutting guide was then removed. Extension gap was measured with a 11 mm spacer block and alignment and extension was confirmed using a long alignment rod. The femur was sized to be a 6 narrow. Rotation of the referencing guide was checked with the epicondylar axis and Whitesides line. Then the 4-in-1 cutting jig was then applied to the distal femur. A stylus was used to confirm that the anterior femur would not be notched.   Then the anterior, posterior and chamfer femoral cuts were then made with an oscillating saw.  The knee was distracted and all posterior osteophytes were removed.  The flexion gap was then measured with a flexion spacer block and long alignment rod and was found to be symmetric with the extension gap and perpendicular to mechanical axis of the tibia.  The proximal tibia plateau was then sized with trial trays. The best coverage was achieved with a size 5. This tibial tray was then pinned into position. The proximal tibia was then prepared with the keel punch.  After tibial preparation was completed, all trial components were inserted with polyethylene trials. The knee achieved full extension and flexed to 120 degrees. Ligament were stable to varus and valgus at full extension as well as 30, 60 and 90 degrees of flexion.   The trials were then placed. Knee was taken through a full range of motion and deemed to be stable with the trial components. All trial components were then removed.  The joint was copiously irrigated with pulse lavage.  The final total knee arthroplasty components were then cemented into place. The knee was held in extension while cement was allowed to cure.The knee was taken through a range of motion and the patella tracked well and the knee was again irrigated copiously.  The knee capsule was then injected with Exparel.  The medial arthrotomy was closed with #1 Vicryl  and #2 Quill. The subcutaneous tissue closed with  2-0 vicryl, and skin approximated with staples.  A dry sterile and compressive dressing was applied.  A Polar Care was applied to the operative knee.  The patient was awakened and brought to the PACU in stable and satisfactory condition.  All sharp, lap and instrument counts were correct at the conclusion the case. I spoke with the patient's family in the postop consultation room to let them know the case had been performed without complication and the patient was stable in recovery room.   Total tourniquet time was 49 minutes.

## 2021-11-21 NOTE — Anesthesia Procedure Notes (Signed)
Anesthesia Regional Block: Adductor canal block   Pre-Anesthetic Checklist: , timeout performed,  Correct Patient, Correct Site, Correct Laterality,  Correct Procedure,, site marked,  Risks and benefits discussed,  Surgical consent,  Pre-op evaluation,  At surgeon's request and post-op pain management  Laterality: Right  Prep: chloraprep       Needles:  Injection technique: Single-shot  Needle Type: Echogenic Needle          Additional Needles:   Procedures:,,,, ultrasound used (permanent image in chart),,   Motor weakness within 20 minutes.  Narrative:  Start time: 11/21/2021 2:08 PM End time: 11/21/2021 2:09 PM Injection made incrementally with aspirations every 5 mL.  Performed by: Personally  Anesthesiologist: Darrin Nipper, MD  Additional Notes: Functioning IV was confirmed and monitors applied.  Sterile prep and drape, hand hygiene and sterile gloves were used. Ultrasound guidance: relevant anatomy identified, needle position confirmed, local anesthetic spread visualized around nerve(s), vascular puncture avoided.  Image saved to electronic medical record.  Negative aspiration prior to incremental administration of local anesthetic for total 20 ml bupivacaine 0.5% given in adductor saphenous distribution. The patient tolerated the procedure well. Vital signs and moderate sedation medications recorded in RN notes.

## 2021-11-21 NOTE — Transfer of Care (Signed)
2Immediate Anesthesia Transfer of Care Note  Patient: SHAIVI ROTHSCHILD  Procedure(s) Performed: TOTAL KNEE ARTHROPLASTY (Right: Knee)  Patient Location: PACU  Anesthesia Type:General  Level of Consciousness: awake  Airway & Oxygen Therapy: Patient Spontanous Breathing and Patient connected to face mask  Post-op Assessment: Report given to RN and Post -op Vital signs reviewed and stable  Post vital signs: Reviewed and stable  Last Vitals:  Vitals Value Taken Time  BP 166/66 11/21/21 1613  Temp    Pulse 74 11/21/21 1615  Resp 16 11/21/21 1615  SpO2 97 % 11/21/21 1615  Vitals shown include unvalidated device data.  Last Pain:  Vitals:   11/21/21 1330  TempSrc: Temporal  PainSc: 0-No pain      Patients Stated Pain Goal: 0 (16/10/96 0454)  Complications: No notable events documented.

## 2021-11-22 ENCOUNTER — Encounter: Payer: Self-pay | Admitting: Orthopedic Surgery

## 2021-11-22 DIAGNOSIS — M1711 Unilateral primary osteoarthritis, right knee: Secondary | ICD-10-CM | POA: Diagnosis not present

## 2021-11-22 MED ORDER — ASPIRIN 81 MG PO CHEW: 81.0000 mg | CHEWABLE_TABLET | Freq: Two times a day (BID) | ORAL | 0 refills | Status: DC

## 2021-11-22 MED ORDER — VITAMIN B-12 1000 MCG PO TABS
1000.0000 ug | ORAL_TABLET | Freq: Every day | ORAL | Status: DC
  Administered 2021-11-22: 1000 ug via ORAL
  Filled 2021-11-22: qty 1

## 2021-11-22 MED ORDER — HYDRALAZINE HCL 50 MG PO TABS
50.0000 mg | ORAL_TABLET | Freq: Two times a day (BID) | ORAL | Status: DC
  Administered 2021-11-22: 50 mg via ORAL
  Filled 2021-11-22: qty 1

## 2021-11-22 MED ORDER — HYDROCODONE-ACETAMINOPHEN 5-325 MG PO TABS: 1.0000 | ORAL_TABLET | ORAL | 0 refills | Status: DC | PRN

## 2021-11-22 MED ORDER — METOCLOPRAMIDE HCL 5 MG/ML IJ SOLN: 5.0000 mg | Freq: Three times a day (TID) | INTRAMUSCULAR | Status: DC | PRN

## 2021-11-22 MED ORDER — HYDROCODONE-ACETAMINOPHEN 7.5-325 MG PO TABS: 1.0000 | ORAL_TABLET | ORAL | Status: DC | PRN

## 2021-11-22 MED ORDER — VITAMIN D 25 MCG (1000 UNIT) PO TABS
1000.0000 [IU] | ORAL_TABLET | Freq: Every day | ORAL | Status: DC
  Administered 2021-11-22: 1000 [IU] via ORAL
  Filled 2021-11-22: qty 1

## 2021-11-22 MED ORDER — PHENOL 1.4 % MT LIQD: 1.0000 | OROMUCOSAL | Status: DC | PRN

## 2021-11-22 MED ORDER — CEFAZOLIN SODIUM-DEXTROSE 2-4 GM/100ML-% IV SOLN
2.0000 g | Freq: Four times a day (QID) | INTRAVENOUS | Status: AC
  Administered 2021-11-22 (×2): 2 g via INTRAVENOUS
  Filled 2021-11-22 (×2): qty 100

## 2021-11-22 MED ORDER — ASPIRIN 81 MG PO CHEW: 81.0000 mg | CHEWABLE_TABLET | Freq: Two times a day (BID) | ORAL | 0 refills | Status: AC

## 2021-11-22 MED ORDER — ONDANSETRON HCL 4 MG/2ML IJ SOLN: 4.0000 mg | Freq: Four times a day (QID) | INTRAMUSCULAR | Status: DC | PRN

## 2021-11-22 MED ORDER — FERROUS SULFATE 325 (65 FE) MG PO TABS
325.0000 mg | ORAL_TABLET | Freq: Every day | ORAL | Status: DC
  Administered 2021-11-22: 325 mg via ORAL
  Filled 2021-11-22: qty 1

## 2021-11-22 MED ORDER — HYDROCODONE-ACETAMINOPHEN 5-325 MG PO TABS
1.0000 | ORAL_TABLET | ORAL | Status: DC | PRN
  Administered 2021-11-22: 1 via ORAL
  Filled 2021-11-22: qty 1

## 2021-11-22 MED ORDER — ALUM & MAG HYDROXIDE-SIMETH 200-200-20 MG/5ML PO SUSP: 30.0000 mL | ORAL | Status: DC | PRN

## 2021-11-22 MED ORDER — ONDANSETRON HCL 4 MG PO TABS: 4.0000 mg | ORAL_TABLET | Freq: Four times a day (QID) | ORAL | Status: DC | PRN

## 2021-11-22 MED ORDER — FUROSEMIDE 20 MG PO TABS
20.0000 mg | ORAL_TABLET | Freq: Two times a day (BID) | ORAL | Status: DC
  Administered 2021-11-22: 20 mg via ORAL
  Filled 2021-11-22: qty 1

## 2021-11-22 MED ORDER — DIPHENHYDRAMINE HCL 12.5 MG/5ML PO ELIX: 12.5000 mg | ORAL_SOLUTION | ORAL | Status: DC | PRN

## 2021-11-22 MED ORDER — MENTHOL 3 MG MT LOZG: 1.0000 | LOZENGE | OROMUCOSAL | Status: DC | PRN

## 2021-11-22 MED ORDER — EZETIMIBE 10 MG PO TABS
10.0000 mg | ORAL_TABLET | Freq: Every day | ORAL | Status: DC
  Administered 2021-11-22: 10 mg via ORAL
  Filled 2021-11-22: qty 1

## 2021-11-22 MED ORDER — BISACODYL 10 MG RE SUPP: 10.0000 mg | Freq: Every day | RECTAL | Status: DC | PRN

## 2021-11-22 MED ORDER — METOCLOPRAMIDE HCL 5 MG PO TABS: 5.0000 mg | ORAL_TABLET | Freq: Three times a day (TID) | ORAL | Status: DC | PRN

## 2021-11-22 MED ORDER — MORPHINE SULFATE (PF) 2 MG/ML IV SOLN: 0.5000 mg | INTRAVENOUS | Status: DC | PRN

## 2021-11-22 MED ORDER — DOCUSATE SODIUM 100 MG PO CAPS: 100.0000 mg | ORAL_CAPSULE | Freq: Two times a day (BID) | ORAL | 0 refills | Status: DC

## 2021-11-22 MED ORDER — DOCUSATE SODIUM 100 MG PO CAPS: 100.0000 mg | ORAL_CAPSULE | Freq: Two times a day (BID) | ORAL | 0 refills | Status: AC

## 2021-11-22 MED ORDER — ASPIRIN 81 MG PO CHEW
81.0000 mg | CHEWABLE_TABLET | Freq: Two times a day (BID) | ORAL | Status: DC
  Administered 2021-11-22: 81 mg via ORAL
  Filled 2021-11-22: qty 1

## 2021-11-22 MED ORDER — ACETAMINOPHEN 325 MG PO TABS
325.0000 mg | ORAL_TABLET | Freq: Four times a day (QID) | ORAL | Status: DC | PRN
  Administered 2021-11-22: 650 mg via ORAL
  Filled 2021-11-22: qty 2

## 2021-11-22 MED ORDER — LACTATED RINGERS IV SOLN: INTRAVENOUS | Status: DC

## 2021-11-22 MED ORDER — DOCUSATE SODIUM 100 MG PO CAPS
100.0000 mg | ORAL_CAPSULE | Freq: Two times a day (BID) | ORAL | Status: DC
  Administered 2021-11-22: 100 mg via ORAL
  Filled 2021-11-22: qty 1

## 2021-11-22 MED ORDER — HYDROCODONE-ACETAMINOPHEN 5-325 MG PO TABS: 1.0000 | ORAL_TABLET | ORAL | 0 refills | Status: AC | PRN

## 2021-11-22 NOTE — Discharge Instructions (Signed)
Continue weight bear as tolerated on the right lower extremity.    Elevate the right lower extremity whenever possible and continue the polar care while elevating the extremity. Patient may shower. No bath or submerging the wound.    Take aspirin 81 mg twice daily for 30 days as directed for blood clot prevention.  Continue to work on knee range of motion exercises at home as instructed by physical therapy. Continue to use a walker for assistance with ambulation until cleared by physical therapy.  Call [phone] with any questions, such as fever > 101.5 degrees, drainage from the wound or shortness of breath. 

## 2021-11-22 NOTE — Plan of Care (Signed)

## 2021-11-22 NOTE — Progress Notes (Addendum)
Subjective:  Patient reports pain as moderate.    Objective:   VITALS:   Vitals:   11/21/21 1900 11/21/21 1943 11/21/21 2320 11/22/21 0332  BP: (!) 157/54 (!) 148/50 (!) 127/47 (!) 136/55  Pulse: 63 65 66 60  Resp: $Remo'14 17 14 14  'xkajA$ Temp: 98.7 F (37.1 C) 98.2 F (36.8 C) 98.1 F (36.7 C) 98.1 F (36.7 C)  TempSrc:      SpO2: 96% 96% 96% 94%  Weight:      Height:        PHYSICAL EXAM:  Neurologically intact ABD soft Neurovascular intact Sensation intact distally Intact pulses distally Dorsiflexion/Plantar flexion intact Incision: dressing C/D/I No cellulitis present Compartment soft  LABS  Results for orders placed or performed during the hospital encounter of 11/21/21 (from the past 24 hour(s))  CBC     Status: Abnormal   Collection Time: 11/21/21  1:41 PM  Result Value Ref Range   WBC 8.9 4.0 - 10.5 K/uL   RBC 3.70 (L) 3.87 - 5.11 MIL/uL   Hemoglobin 10.8 (L) 12.0 - 15.0 g/dL   HCT 33.1 (L) 36.0 - 46.0 %   MCV 89.5 80.0 - 100.0 fL   MCH 29.2 26.0 - 34.0 pg   MCHC 32.6 30.0 - 36.0 g/dL   RDW 14.3 11.5 - 15.5 %   Platelets 101 (L) 150 - 400 K/uL   nRBC 0.0 0.0 - 0.2 %  Basic metabolic panel     Status: Abnormal   Collection Time: 11/21/21  1:41 PM  Result Value Ref Range   Sodium 140 135 - 145 mmol/L   Potassium 4.0 3.5 - 5.1 mmol/L   Chloride 107 98 - 111 mmol/L   CO2 25 22 - 32 mmol/L   Glucose, Bld 92 70 - 99 mg/dL   BUN 26 (H) 8 - 23 mg/dL   Creatinine, Ser 1.30 (H) 0.44 - 1.00 mg/dL   Calcium 9.3 8.9 - 10.3 mg/dL   GFR, Estimated 44 (L) >60 mL/min   Anion gap 8 5 - 15  Type and screen [place]     Status: None   Collection Time: 11/21/21  1:41 PM  Result Value Ref Range   ABO/RH(D) A POS    Antibody Screen NEG    Sample Expiration      11/24/2021,2359 Performed at [facility], [address] [place], [place] [address]   ABO/Rh     Status: None   Collection Time: 11/21/21  1:46 PM  Result Value Ref Range    ABO/RH(D)      A POS Performed at [facility], [address] [place], [place] [address]   Urinalysis, Routine w reflex microscopic Urine, Clean Catch     Status: Abnormal   Collection Time: 11/21/21  1:57 PM  Result Value Ref Range   Color, Urine YELLOW (A) YELLOW   APPearance CLEAR (A) CLEAR   Specific Gravity, Urine 1.014 1.005 - 1.030   pH 6.0 5.0 - 8.0   Glucose, UA NEGATIVE NEGATIVE mg/dL   Hgb urine dipstick NEGATIVE NEGATIVE   Bilirubin Urine NEGATIVE NEGATIVE   Ketones, ur NEGATIVE NEGATIVE mg/dL   Protein, ur 100 (A) NEGATIVE mg/dL   Nitrite NEGATIVE NEGATIVE   Leukocytes,Ua NEGATIVE NEGATIVE   RBC / HPF 0-5 0 - 5 RBC/hpf   WBC, UA 0-5 0 - 5 WBC/hpf   Bacteria, UA NONE SEEN NONE SEEN   Squamous Epithelial / LPF 0-5 0 - 5  Mucus PRESENT     DG Knee Right Port  Result Date: 11/21/2021 CLINICAL DATA:  Status post right knee arthroplasty EXAM: PORTABLE RIGHT KNEE - 1-2 VIEW COMPARISON:  None Available. FINDINGS: There is recent right knee arthroplasty. No fracture is seen. There are pockets of air in the soft tissues. Skin staples are noted anteriorly. IMPRESSION: Status post right knee arthroplasty. Electronically Signed   By: Elmer Picker M.D.   On: 11/21/2021 16:39   [place] OR NERVE BLOCK-IMAGE ONLY [facility])  Result Date: 11/21/2021 There is no interpretation for this exam.  This order is for images obtained during a surgical procedure.  Please See "Surgeries" Tab for more information regarding the procedure.    Assessment/Plan: 1 Day Post-Op   Principal Problem:   S/P TKR (total knee replacement) using cement, right   Advance diet Up with therapy Discharge home when PT goals met   [name] , PA-C 11/22/2021, 6:35 AM

## 2021-11-22 NOTE — Discharge Summary (Signed)
Physician Discharge Summary  Patient ID: Leslie Smith MRN: [id number] DOB/AGE: 71-Sep-1952 71 y.o.  Admit date: 11/21/2021 Discharge date: 11/22/2021  Admission Diagnoses:  M17.11 Unilateral primary osteoarthritis, right knee S/P TKR (total knee replacement) using cement, right  Discharge Diagnoses:  M17.11 Unilateral primary osteoarthritis, right knee Principal Problem:   S/P TKR (total knee replacement) using cement, right   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[place]) 09/15/2014   Bladder incontinence    Cancer ([place]) 2014   brain tumor, resected   Cellulitis and abscess of leg 2019   2 episodes this year   Complication of anesthesia    DJD (degenerative joint disease) of knee 2018   Fall    Fatigue    History of blood transfusion    Hypertension    MS (multiple sclerosis) ([place])    Myelitis ([place])    Neuromuscular disorder ([place]) 2001   multiple sclerosis diagnosed by MRI   PONV (postoperative nausea and vomiting)    severe   Thrombocytopenia ([place])     Surgeries: Procedure(s): TOTAL KNEE ARTHROPLASTY on 11/21/2021   Consultants (if any):   Discharged Condition: Improved  Hospital Course: Leslie Smith is an 71 y.o. 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    She was given perioperative antibiotics:  Anti-infectives (From admission, onward)    Start     Dose/Rate Route Frequency Ordered Stop   11/22/21 0445  ceFAZolin (ANCEF) IVPB 2g/100 mL premix        2 g 200 mL/hr over 30 Minutes Intravenous Every 6 hours 11/22/21 0345 11/22/21 0856   11/21/21 1340  ceFAZolin (ANCEF) 2-4 GM/100ML-% IVPB       Note to Pharmacy: Herby Abraham W: cabinet override      11/21/21 1340 11/21/21 1442   11/21/21 0600  ceFAZolin (ANCEF) IVPB 2g/100 mL premix         2 g 200 mL/hr over 30 Minutes Intravenous On call to O.R. 11/21/21 0865 11/21/21 1438     .  She was given sequential compression devices, early ambulation, and 81 mg twice daily for 30 days  for DVT prophylaxis.  She benefited maximally from the hospital stay and there were no complications.    Recent vital signs:  Vitals:   11/22/21 0332 11/22/21 0734  BP: (!) 136/55 (!) 139/50  Pulse: 60 66  Resp: 14 18  Temp: 98.1 F (36.7 C) 98.1 F (36.7 C)  SpO2: 94% 93%    Recent laboratory studies:  Lab Results  Component Value Date   HGB 10.8 (L) 11/21/2021   HGB 10.5 (L) 11/12/2021   HGB 11.4 (L) 08/10/2021   Lab Results  Component Value Date   WBC 8.9 11/21/2021   PLT 101 (L) 11/21/2021   Lab Results  Component Value Date   INR 0.9 01/04/2019   Lab Results  Component Value Date   NA 140 11/21/2021   K 4.0 11/21/2021   CL 107 11/21/2021   CO2 25 11/21/2021   BUN 26 (H) 11/21/2021   CREATININE 1.30 (H) 11/21/2021   GLUCOSE 92 11/21/2021    Discharge Medications:   Allergies as of 11/22/2021       Reactions   Ezetimibe Other (See Comments)   Joint Pain    Watermelon [citrullus Vulgaris] Diarrhea  Severe diarrhea within 2 minutes of eating   Adhesive [tape]    Other Nausea And Vomiting   general anesthesia - deathly ill    Atorvastatin Other (See Comments)   Joint pain   Meloxicam Nausea Only        Medication List     TAKE these medications    aspirin 81 MG chewable tablet Chew 1 tablet (81 mg total) by mouth 2 (two) times daily.   docusate sodium 100 MG capsule Commonly known as: COLACE Take 1 capsule (100 mg total) by mouth 2 (two) times daily.   ezetimibe 10 MG tablet Commonly known as: ZETIA Take 10 mg by mouth daily.   FeroSul 325 (65 FE) MG tablet Generic drug: ferrous sulfate Take by mouth.   fluticasone 50 MCG/ACT nasal spray Commonly known as: FLONASE Place into the nose.   furosemide 20 MG tablet Commonly known as:  LASIX Take 20 mg by mouth 2 (two) times daily.   hydrALAZINE 50 MG tablet Commonly known as: APRESOLINE Take 1 tablet by mouth 2 (two) times daily.   HYDROcodone-acetaminophen 5-325 MG tablet Commonly known as: NORCO/VICODIN Take 1 tablet by mouth every 4 (four) hours as needed for moderate pain (pain score 4-6). What changed:  when to take this reasons to take this   ibuprofen 800 MG tablet Commonly known as: IBU Take 1 tablet (800 mg total) by mouth every 8 (eight) hours as needed for moderate pain.   vitamin B-12 1000 MCG tablet Commonly known as: CYANOCOBALAMIN Take 1,000 mcg by mouth daily.   Vitamin D-3 25 MCG (1000 UT) Caps Take 1,000 Units by mouth daily.               Durable Medical Equipment  (From admission, onward)           Start     Ordered   11/22/21 1007  For home use only DME 3 n 1  Once        11/22/21 1007   11/22/21 1007  For home use only DME Walker rolling  Once       Question Answer Comment  Walker: With 5 Inch Wheels   Patient needs a walker to treat with the following condition Osteoarthritis of right knee      11/22/21 1007            Diagnostic Studies: DG Knee Right Port  Result Date: 11/21/2021 CLINICAL DATA:  Status post right knee arthroplasty EXAM: PORTABLE RIGHT KNEE - 1-2 VIEW COMPARISON:  None Available. FINDINGS: There is recent right knee arthroplasty. No fracture is seen. There are pockets of air in the soft tissues. Skin staples are noted anteriorly. IMPRESSION: Status post right knee arthroplasty. Electronically Signed   By: Elmer Picker M.D.   On: 11/21/2021 16:39   [place] OR NERVE BLOCK-IMAGE ONLY [facility])  Result Date: 11/21/2021 There is no interpretation for this exam.  This order is for images obtained during a surgical procedure.  Please See "Surgeries" Tab for more information regarding the procedure.    Disposition: Discharge disposition: 01-Home or Self Care            Signed: [name] ,PA-C 11/22/2021, 10:08 AM

## 2021-11-22 NOTE — Evaluation (Signed)
Physical Therapy Evaluation Patient Details Name: Leslie Smith MRN: [id number] DOB: 08-27-1950 Today's Date: 11/22/2021  History of Present Illness  admitted for acute hospitalization s/p R TKR, WBAT (11/21/21)  Clinical Impression  Patient up in recliner upon arrival to room; alert and oriented, follows commands, hopeful for discharge home this date.  Rates R knee pain 6/10; minimally changed with WBing and activity.  Demonstrates good post-op R LE strength (4-/5) and ROM (4-92 degrees); good isolated strength and control with therex and functional activities.  Able to complete sit/stand, basic transfers and gait (200') with RW, cga for safety.  Demonstrates mixed step to and step through gait pattern; heavy WBing bilat UEs with increased force of contact L LE.  2-3 standing rest breaks to complete distance.  SaO2 98%, HR 70s after distance.  Patient very motivated to participate/progress; anticipate consistent progression towards all mobility goals. Would benefit from skilled PT to address above deficits and promote optimal return to PLOF.; Recommend transition to HHPT upon discharge from acute hospitalization.      Recommendations for follow up therapy are one component of a multi-disciplinary discharge planning process, led by the attending physician.  Recommendations may be updated based on patient status, additional functional criteria and insurance authorization.  Follow Up Recommendations Home health PT    Assistance Recommended at Discharge None (has RW, [facility])  Patient can return home with the following  A little help with walking and/or transfers;A little help with bathing/dressing/bathroom    Equipment Recommendations    Recommendations for Other Services       Functional Status Assessment Patient has had a recent decline in their functional status and demonstrates the ability to make significant improvements in function in a reasonable and predictable amount of time.      Precautions / Restrictions Precautions Precautions: Fall Restrictions Weight Bearing Restrictions: Yes RLE Weight Bearing: Weight bearing as tolerated      Mobility  Bed Mobility               General bed mobility comments: seated in recliner beginning/end of treatment session    Transfers Overall transfer level: Needs assistance Equipment used: Rolling walker (2 wheels) Transfers: Sit to/from Stand Sit to Stand: Min guard           General transfer comment: cuing for hand placement to optimize safety/mechanics    Ambulation/Gait Ambulation/Gait assistance: Min guard Gait Distance (Feet): 200 Feet Assistive device: Rolling walker (2 wheels)         General Gait Details: mixed step to and step through gait pattern; heavy WBing bilat UEs with increased force of contact L LE.  2-3 standing rest breaks to complete distance.  SaO2 98%, HR 70s after distance  Stairs            Wheelchair Mobility    Modified Rankin (Stroke Patients Only)       Balance Overall balance assessment: Needs assistance Sitting-balance support: No upper extremity supported, Feet supported Sitting balance-Leahy Scale: Good     Standing balance support: Bilateral upper extremity supported Standing balance-Leahy Scale: Fair Standing balance comment: heavy UE support, RW required to maintain                             Pertinent Vitals/Pain Pain Assessment Pain Assessment: 0-10 Pain Score: 6  Pain Intervention(s): Limited activity within patient's tolerance, Premedicated before session, Repositioned, Monitored during session    Home Living Family/patient expects to  be discharged to:: Private residence Living Arrangements: Alone Available Help at Discharge: Family;Available PRN/intermittently Type of Home: House Home Access: Ramped entrance       Home Layout: Two level;Able to live on main level with bedroom/bathroom Home Equipment: Rolling Walker (2  wheels);Cane - single point;BSC/3in1 Additional Comments: Plans to have friend stay with her for a few days at discharge    Prior Function Prior Level of Function : Independent/Modified Independent             Mobility Comments: Indep with ADLs, household and community mobilization without assist device; endorses multiple "near misses", but no recent falls       Hand Dominance   Dominant Hand: Right    Extremity/Trunk Assessment   Upper Extremity Assessment Upper Extremity Assessment: Overall WFL for tasks assessed    Lower Extremity Assessment Lower Extremity Assessment: Generalized weakness (R knee grossly 4-/5 throughout, limited by post-op soreness and ace bandaging; otherwise, grossly WFL)       Communication   Communication: No difficulties  Cognition Arousal/Alertness: Awake/alert Behavior During Therapy: WFL for tasks assessed/performed Overall Cognitive Status: Within Functional Limits for tasks assessed                                          General Comments      Exercises Total Joint Exercises Goniometric ROM: R LE: 4-92 degrees Other Exercises Other Exercises: Educated in role of PT and progressive mobility, R LE WBing restrictions, positioning considerations, use of polar care; patient voiced understanding. Other Exercises: Toilet transfer, ambulatory with RW, cga/close sup; CNA to assist back to chair once complete (patient/CNA aware) Other Exercises: Seated R LE therex, 1x10, active ROM for muscular strength/endurance   Assessment/Plan    PT Assessment Patient needs continued PT services  PT Problem List Decreased strength;Decreased range of motion;Decreased balance;Decreased activity tolerance;Decreased mobility;Decreased knowledge of use of DME;Decreased safety awareness;Decreased knowledge of precautions;Cardiopulmonary status limiting activity;Pain       PT Treatment Interventions DME instruction;Gait training;Functional  mobility training;Therapeutic activities;Therapeutic exercise;Balance training;Patient/family education    PT Goals (Current goals can be found in the Care Plan section)  Acute Rehab PT Goals Patient Stated Goal: to return home PT Goal Formulation: With patient Time For Goal Achievement: 12/06/21 Potential to Achieve Goals: Good    Frequency BID     Co-evaluation               AM-PAC PT "6 Clicks" Mobility  Outcome Measure Help needed turning from your back to your side while in a flat bed without using bedrails?: None Help needed moving from lying on your back to sitting on the side of a flat bed without using bedrails?: A Little Help needed moving to and from a bed to a chair (including a wheelchair)?: A Little Help needed standing up from a chair using your arms (e.g., wheelchair or bedside chair)?: A Little Help needed to walk in hospital room?: A Little Help needed climbing 3-5 steps with a railing? : A Little 6 Click Score: 19    End of Session Equipment Utilized During Treatment: Gait belt Activity Tolerance: Patient tolerated treatment well Patient left: in chair;with call bell/phone within reach (Falll score 8; alarm not required) Nurse Communication: Mobility status PT Visit Diagnosis: Other abnormalities of gait and mobility (R26.89);Muscle weakness (generalized) (M62.81);Pain Pain - Right/Left: Right Pain - part of body: Knee  Time: 3354-5625 PT Time Calculation (min) (ACUTE ONLY): 31 min   Charges:   PT Evaluation $PT Eval Moderate Complexity: 1 Mod PT Treatments $Therapeutic Activity: 8-22 mins       Johni Narine H. Owens Shark, PT, DPT, NCS 11/22/21, 10:35 AM [phone]

## 2021-11-22 NOTE — Progress Notes (Signed)
Met with the patient in the room at the bedside The patient lives at Home alone, has a friend that will be helping The patient  currently has DME rolling walker, 3 in 1, handicapped toilet, reacher, ramp The patient will not need additional DME They have transportation with her friend Thalia Party They can afford their medication  They are [facility] with [facility] services

## 2021-11-22 NOTE — Evaluation (Signed)
Occupational Therapy Evaluation Patient Details Name: Leslie Smith MRN: [id number] DOB: 1950-07-30 Today's Date: 11/22/2021   History of Present Illness admitted for acute hospitalization s/p R TKR, WBAT (11/21/21)   Clinical Impression   Pt seen for OT evaluation this date, POD#1 from above surgery. Pt was independent in all ADLs prior to surgery, however occasionally using SPC for mobility due to L knee weakness (2/2 MS). Pt is eager to return to PLOF with less pain and improved safety and independence. Pt instructed in polar care mgt, falls prevention strategies, home/routines modifications, DME/AE for LB bathing and dressing tasks. Provided educ re: exercises to improve balance and increase strength, energy conservation strategies. Pt verbalizes understanding and provides teach-back. Do not currently anticipate any OT needs following this hospitalization.       Recommendations for follow up therapy are one component of a multi-disciplinary discharge planning process, led by the attending physician.  Recommendations may be updated based on patient status, additional functional criteria and insurance authorization.   Follow Up Recommendations  No OT follow up    Assistance Recommended at Discharge PRN  Patient can return home with the following      Functional Status Assessment  Patient has had a recent decline in their functional status and demonstrates the ability to make significant improvements in function in a reasonable and predictable amount of time.  Equipment Recommendations       Recommendations for Other Services       Precautions / Restrictions Precautions Precautions: Fall Restrictions Weight Bearing Restrictions: Yes RLE Weight Bearing: Weight bearing as tolerated      Mobility Bed Mobility               General bed mobility comments: seated in recliner beginning/end of treatment session    Transfers Overall transfer level: Needs assistance Equipment  used: Rolling walker (2 wheels) Transfers: Sit to/from Stand Sit to Stand: Min guard           General transfer comment: good technique using RW      Balance Overall balance assessment: Needs assistance Sitting-balance support: No upper extremity supported, Feet supported Sitting balance-Leahy Scale: Good     Standing balance support: Bilateral upper extremity supported Standing balance-Leahy Scale: Fair Standing balance comment: heavy UE support, RW required to maintain                           ADL either performed or assessed with clinical judgement   ADL                                               Vision         Perception     Praxis      Pertinent Vitals/Pain Pain Assessment Pain Assessment: 0-10 Pain Score: 4  Pain Intervention(s): Limited activity within patient's tolerance, Repositioned, Premedicated before session     Hand Dominance Right   Extremity/Trunk Assessment Upper Extremity Assessment Upper Extremity Assessment: Overall WFL for tasks assessed   Lower Extremity Assessment Lower Extremity Assessment: Generalized weakness       Communication Communication Communication: No difficulties   Cognition Arousal/Alertness: Awake/alert Behavior During Therapy: WFL for tasks assessed/performed Overall Cognitive Status: Within Functional Limits for tasks assessed  General Comments       Exercises Other Exercises Other Exercises: Educ re: polar care mgmt, home/routines modifications, AE for LB dressing, falls prevention, energy conservation strategies, therex for balance   Shoulder Instructions      Home Living Family/patient expects to be discharged to:: Private residence Living Arrangements: Alone Available Help at Discharge: Family;Available PRN/intermittently Type of Home: House Home Access: Ramped entrance     Home Layout: Two level;Able to live  on main level with bedroom/bathroom               Home Equipment: Rolling Walker (2 wheels);Cane - single point;BSC/3in1   Additional Comments: Plans to have friend stay with her for a few days at discharge      Prior Functioning/Environment Prior Level of Function : Independent/Modified Independent             Mobility Comments: Indep with ADLs, household and community mobilization. Uses SPC PRN ("on bad days"). Has a service dog          OT Problem List: Decreased strength;Decreased range of motion;Decreased activity tolerance;Impaired balance (sitting and/or standing);Pain      OT Treatment/Interventions:      OT Goals(Current goals can be found in the care plan section) Acute Rehab OT Goals Patient Stated Goal: to improve balance OT Goal Formulation: With patient Time For Goal Achievement: 12/06/21 Potential to Achieve Goals: Good  OT Frequency:      Co-evaluation              AM-PAC OT "6 Clicks" Daily Activity     Outcome Measure Help from another person eating meals?: None Help from another person taking care of personal grooming?: None Help from another person toileting, which includes using toliet, bedpan, or urinal?: A Little Help from another person bathing (including washing, rinsing, drying)?: A Little Help from another person to put on and taking off regular upper body clothing?: None Help from another person to put on and taking off regular lower body clothing?: A Little 6 Click Score: 21   End of Session Equipment Utilized During Treatment: Rolling walker (2 wheels) Nurse Communication: Mobility status;Other (comment) (cleared by OT for DC)  Activity Tolerance: Patient tolerated treatment well Patient left: in chair;with call bell/phone within reach  OT Visit Diagnosis: Muscle weakness (generalized) (M62.81);Unsteadiness on feet (R26.81);Pain Pain - Right/Left: Right Pain - part of body: Knee                Time: 1222-1243 OT Time  Calculation (min): 21 min Charges:  OT General Charges $OT Visit: 1 Visit OT Evaluation $OT Eval Low Complexity: 1 Low OT Treatments $Self Care/Home Management : 8-22 mins Josiah Lobo, PhD, MS, OTR/L 11/22/21, 12:53 PM

## 2021-12-25 ENCOUNTER — Ambulatory Visit: Payer: Medicare Other | Attending: Orthopedic Surgery | Admitting: Physical Therapy

## 2021-12-25 ENCOUNTER — Encounter: Payer: Self-pay | Admitting: Physical Therapy

## 2021-12-25 DIAGNOSIS — R269 Unspecified abnormalities of gait and mobility: Secondary | ICD-10-CM | POA: Insufficient documentation

## 2021-12-25 DIAGNOSIS — R2689 Other abnormalities of gait and mobility: Secondary | ICD-10-CM | POA: Insufficient documentation

## 2021-12-25 DIAGNOSIS — M25561 Pain in right knee: Secondary | ICD-10-CM | POA: Insufficient documentation

## 2021-12-25 DIAGNOSIS — R278 Other lack of coordination: Secondary | ICD-10-CM | POA: Diagnosis present

## 2021-12-25 DIAGNOSIS — R2681 Unsteadiness on feet: Secondary | ICD-10-CM | POA: Diagnosis present

## 2021-12-25 DIAGNOSIS — R262 Difficulty in walking, not elsewhere classified: Secondary | ICD-10-CM | POA: Diagnosis present

## 2021-12-25 DIAGNOSIS — M6281 Muscle weakness (generalized): Secondary | ICD-10-CM | POA: Insufficient documentation

## 2021-12-25 NOTE — Therapy (Signed)
OUTPATIENT PHYSICAL THERAPY LOWER EXTREMITY EVALUATION   Patient Name: Leslie Smith MRN: [id number] DOB:1951/01/19, 71 y.o., female Today's Date: 12/26/2021   PT End of Session - 12/26/21 0846     Visit Number 1    Number of Visits 17    Date for PT Re-Evaluation 02/20/22    Authorization Type Medicare    PT Start Time 0848    PT Stop Time 0935    PT Time Calculation (min) 47 min    Equipment Utilized During Treatment Gait belt    Activity Tolerance Patient tolerated treatment well    Behavior During Therapy [facility] for tasks assessed/performed             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[place]) 09/15/2014   Bladder incontinence    Cancer ([place]) 2014   brain tumor, resected   Cellulitis and abscess of leg 2019   2 episodes this year   Complication of anesthesia    DJD (degenerative joint disease) of knee 2018   Fall    Fatigue    History of blood transfusion    Hypertension    MS (multiple sclerosis) ([place])    Myelitis ([place])    Neuromuscular disorder ([place]) 2001   multiple sclerosis diagnosed by MRI   PONV (postoperative nausea and vomiting)    severe   Thrombocytopenia ([place])    Past Surgical History:  Procedure Laterality Date   ABDOMINAL HYSTERECTOMY  2011   required wound vac for 6 weeks. ovaries had grown attached to her back bone   BRAIN SURGERY  2015   tumor resection-denies seizures   BREAST SURGERY Left 2014   biopsy. negative   HARDWARE REMOVAL Left 05/05/2018   Procedure: HARDWARE REMOVAL- LEFT ANKLE;  Surgeon: Hessie Knows, MD;  Location: [facility];  Service: Orthopedics;  Laterality: Left;   I & D EXTREMITY Left 08/25/2015   Procedure: IRRIGATION AND DEBRIDEMENT THUMB;  Surgeon: Iran Planas, MD;  Location: [place];  Service: Orthopedics;  Laterality: Left;   ORIF ANKLE FRACTURE Left 02/20/2018   Procedure: OPEN REDUCTION INTERNAL FIXATION (ORIF) ANKLE FRACTURE;  Surgeon: Hessie Knows, MD;  Location: [facility];  Service:  Orthopedics;  Laterality: Left;   TOTAL KNEE ARTHROPLASTY Right 11/21/2021   Procedure: TOTAL KNEE ARTHROPLASTY;  Surgeon: Lovell Sheehan, MD;  Location: [facility];  Service: Orthopedics;  Laterality: Right;   Patient Active Problem List   Diagnosis Date Noted   S/P TKR (total knee replacement) using cement, right 11/21/2021   Atherosclerosis of abdominal aorta ([place]) 01/10/2021   Coronary artery disease involving native coronary artery of native heart 01/10/2021   OSA (obstructive sleep apnea) 04/19/2020   Statin intolerance 01/14/2020   S/P ORIF (open reduction internal fixation) fracture 02/20/2018   DJD (degenerative joint disease) of knee 01/02/2017   Advanced care planning/counseling discussion 01/02/2017   Thrombocytopenia ([place]) 10/30/2015   Multiple sclerosis ([place])    Essential hypertension    Hyperlipidemia    Benign neoplasm of brain ([place]) 04/30/2013   Cerebral meningioma ([place]) 04/29/2013   Malignant hypertension 04/29/2013   Obesity 04/15/2013    PCP: Fulton Reek, MD  REFERRING PROVIDER: Kurtis Bushman MD  REFERRING DIAG: R TKA, 11/21/21  THERAPY DIAG:  Chronic pain of right knee  Muscle weakness (generalized)  Difficulty in walking, not elsewhere classified  Unsteadiness on feet  Rationale for Evaluation and Treatment Rehabilitation  ONSET DATE: 11/21/21  SUBJECTIVE:   SUBJECTIVE STATEMENT: Patient reports, "I am doing  pretty good. I have been doing what I need to do and I can't believe how much better it is."  Seeing Dr. Harlow Mares 01/07/22  PERTINENT HISTORY: 71 yo Female s/p R TKA on 11/21/21. Patient reports having home health PT for 2-3 weeks. She reports doing well. She presents to therapy without AD. She reports not needing AD at home. She has been sleeping with CPAP and cryocuff on RLE- but has been sleeping well; PMH significant: MS, Sleep apnea, 2014 brain tumor, history of LE cellulitis, intermittent fluid build up/swelling in LE, DJD, HTN,  Thrombocytopenia;  Pt is following up with PCP regarding LE swelling;  Pt did have elevated BP during last bout of therapy- She has not been checking it but reports the home health PT stated it was elevated at times;   PAIN:  Are you having pain? Yes: NPRS scale: 4/10 Pain location: right knee Pain description: dull Aggravating factors: worse in the evening/swelling;  Relieving factors: pain medicine- has limited some; ice;   PRECAUTIONS: Fall Risk  WEIGHT BEARING RESTRICTIONS No  FALLS:  Has patient fallen in last 6 months? Yes. Number of falls 1  LIVING ENVIRONMENT: Lives with: lives alone Lives in: House/apartment Stairs: Yes: Internal: 13 steps; on right going up and External: 3 steps; has ramp Has following equipment at home: Single point cane, Walker - 2 wheeled, Environmental consultant - 4 wheeled, Crutches, bed side commode, and Grab bars  OCCUPATION: Retired Emergency planning/management officer  PLOF: Independent with basic ADLs, Independent with gait, and Independent with transfers  Had PT for MS/imbalance from: 09/27/20-08/02/21 Prior Outcome measures:  DGI: 06/13/21: 19/24 5 times sit<>Stand : 04/11/22: 14.5 sec Timed up and go: 06/13/21: 8.12 sec 6 min walk: 07/31/21: 1100 feet with elevated BP FGA: 06/13/21: 17/30     PATIENT GOALS Improve transfers, increase endurance/activity tolerance, Improve strength Pt would like to be able to clean her house and be more active at home.    OBJECTIVE:   DIAGNOSTIC FINDINGS: Xray- 11/21/21: knee looked stable  PATIENT SURVEYS:  FOTO 46%  COGNITION:  Overall cognitive status: Within functional limits for tasks assessed     SENSATION: WFL  EDEMA:  Not formally assessed;   POSTURE: No Significant postural limitations  PALPATION: Reports moderate tenderness along right medial/posterior knee   LOWER EXTREMITY ROM:  Active ROM Right eval Left eval                          Knee flexion 108 114  Knee extension -5 -2                   (Blank  rows = not tested)  LOWER EXTREMITY MMT:  MMT Right eval Left eval  Hip flexion 4/5 4/5  Hip extension    Hip abduction 3+/5 4/5  Hip adduction    Hip internal rotation    Hip external rotation    Knee flexion    Knee extension    Ankle dorsiflexion    Ankle plantarflexion    Ankle inversion    Ankle eversion     (Blank rows = not tested)    FUNCTIONAL TESTS:  5 times sit to stand: 19.95 sec with hands on knees (>15 sec indicates high risk for falls) 10 meter walk test: 10.65 sec (0.98 m/s), limited community ambulator  SLS: R: 3-5 sec  L: 5-8 sec  GAIT: Pt ambulated on level surface without AD, reciprocal gait pattern, slightly slower gait speed,  no foot drag observed this session;     TODAY'S TREATMENT: Reinforced HEP: Seated quad set 5 sec hold x2-3 reps Seated LAQ/knee flexion AROM x2-3 reps Instructed patient    PATIENT EDUCATION:  Education details: plan of care/HEP Person educated: Patient Education method: Explanation, Demonstration, and Verbal cues Education comprehension: verbalized understanding, returned demonstration, verbal cues required, and needs further education   HOME EXERCISE PROGRAM: Continue as given from home health PT Reinforced importance of ROM including quad sets and LAQ/knee flexion;   ASSESSMENT:  CLINICAL IMPRESSION: Patient is a 70 y.o. Female who was seen today for physical therapy evaluation and treatment s/p RLE TKA on 11/21/21. Patient presents to therapy without AD and is walking well with reciprocal gait pattern. She does report increased stiffness in AM and continued soreness but states its better than initially.  She is having trouble doing some household chores and activities and is wanting to increase strength and endurance to return to PLOF. She lives alone but does have a trained support dog which can provide some aide. Patient has significant PMH for MS which also affects mobility. She also has HTN and has been having  intermittent swelling in BLE which affects mobility. She did have PT within the last year to address imbalance and deconditioning. She made some progress but was most limited by right knee pain which is why she had TKA.  Patient would benefit from skilled PT intervention to improve strength, balance and gait safety;    OBJECTIVE IMPAIRMENTS Abnormal gait, cardiopulmonary status limiting activity, decreased activity tolerance, decreased balance, decreased endurance, decreased mobility, difficulty walking, decreased ROM, decreased strength, hypomobility, impaired flexibility, and pain.   ACTIVITY LIMITATIONS bending, standing, squatting, stairs, transfers, and locomotion level  PARTICIPATION LIMITATIONS: cleaning, laundry, shopping, community activity, occupation, yard work, and church  PERSONAL FACTORS Age, Fitness, and 3+ comorbidities: MS, Sleep apnea, 2014 brain tumor, history of LE cellulitis, intermittent fluid build up/swelling in LE, DJD, HTN, Thrombocytopenia;   are also affecting patient's functional outcome.   REHAB POTENTIAL: Good  CLINICAL DECISION MAKING: Stable/uncomplicated  EVALUATION COMPLEXITY: Low   GOALS: Goals reviewed with patient? Yes  SHORT TERM GOALS: Target date: 01/23/2022  Patient will be adherent to HEP at least 3x a week to improve functional strength and balance for better safety at home. Baseline: Doing some exercise from home health Goal status: INITIAL  2.  Patient will improve RLE knee extension to 0 to improve knee ROM for functional tasks such as standing/walking.  Baseline: RLE: -5 degrees in supine Goal status: INITIAL   LONG TERM GOALS: Target date: 02/20/2022   Patient will increase 10 meter walk test to >1.57ms as to improve gait speed for better community ambulation and to reduce fall risk. Baseline: 0.98 m/s Goal status: INITIAL  2.  Patient will tolerate 5 seconds of single leg stance without loss of balance to improve ability to get in  and out of shower safely. Baseline: RLE: 3-5 sec inconsistent Goal status: INITIAL  3.   Patient will be independent with ascend/descend 12 steps using single UE in step over step pattern without LOB to safely get up/down stairs in home. Baseline: Not formally assessed, negotiates one step at a time; limits steps when possible Goal status: INITIAL  4.  Pt will improve RLE knee flexion to >115 degrees for functional ROM for transfers and step negotiation;  Baseline: RLE: 108 degrees Goal status: INITIAL  5.  Patient (> 652years old) will complete five times sit to  stand test in < 15 seconds indicating an increased LE strength and improved balance. Baseline: 19.95 sec Goal status: INITIAL  6.  Patient will improve FOTO score by 5% to indicate improved functional mobility with ADLs.  Baseline: 46% Goal status: INITIAL   PLAN: PT FREQUENCY: 2x/week  PT DURATION: 8 weeks  PLANNED INTERVENTIONS: Therapeutic exercises, Therapeutic activity, Neuromuscular re-education, Balance training, Gait training, Patient/Family education, Self Care, Joint mobilization, Stair training, Dry Needling, Cryotherapy, Moist heat, scar mobilization, Taping, and Manual therapy  PLAN FOR NEXT SESSION: work on ROM/strengthening;    Laytoya Ion, PT, DPT 12/26/2021, 8:47 AM

## 2021-12-26 NOTE — H&P (Signed)
Leslie Smith MRN:  [id number] DOB/SEX:  [birth year]/female  CHIEF COMPLAINT:  Painful right Knee  HISTORY: Patient is a 71 y.o. female presented with a history of pain in the right knee. Onset of symptoms was gradual starting several years ago with gradually worsening course since that time. Prior procedures on the knee include none. Patient has been treated conservatively with over-the-counter NSAIDs and activity modification. Patient currently rates pain in the knee at 10 out of 10 with activity. There is pain at night.  PAST MEDICAL HISTORY: Patient Active Problem List   Diagnosis Date Noted   S/P TKR (total knee replacement) using cement, right 11/21/2021   Atherosclerosis of abdominal aorta ([place]) 01/10/2021   Coronary artery disease involving native coronary artery of native heart 01/10/2021   OSA (obstructive sleep apnea) 04/19/2020   Statin intolerance 01/14/2020   S/P ORIF (open reduction internal fixation) fracture 02/20/2018   DJD (degenerative joint disease) of knee 01/02/2017   Advanced care planning/counseling discussion 01/02/2017   Thrombocytopenia ([place]) 10/30/2015   Multiple sclerosis ([place])    Essential hypertension    Hyperlipidemia    Benign neoplasm of brain ([place]) 04/30/2013   Cerebral meningioma ([place]) 04/29/2013   Malignant hypertension 04/29/2013   Obesity 04/15/2013   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[place]) 09/15/2014   Bladder incontinence    Cancer ([place]) 2014   brain tumor, resected   Cellulitis and abscess of leg 2019   2 episodes this year   Complication of anesthesia    DJD (degenerative joint disease) of knee 2018   Fall    Fatigue    History of blood transfusion    Hypertension    MS (multiple sclerosis) ([place])    Myelitis ([place])    Neuromuscular disorder ([place]) 2001   multiple sclerosis diagnosed by MRI   PONV (postoperative nausea and vomiting)    severe   Thrombocytopenia ([place])    Past Surgical History:  Procedure  Laterality Date   ABDOMINAL HYSTERECTOMY  2011   required wound vac for 6 weeks. ovaries had grown attached to her back bone   BRAIN SURGERY  2015   tumor resection-denies seizures   BREAST SURGERY Left 2014   biopsy. negative   HARDWARE REMOVAL Left 05/05/2018   Procedure: HARDWARE REMOVAL- LEFT ANKLE;  Surgeon: Hessie Knows, MD;  Location: [facility];  Service: Orthopedics;  Laterality: Left;   I & D EXTREMITY Left 08/25/2015   Procedure: IRRIGATION AND DEBRIDEMENT THUMB;  Surgeon: Iran Planas, MD;  Location: [place];  Service: Orthopedics;  Laterality: Left;   ORIF ANKLE FRACTURE Left 02/20/2018   Procedure: OPEN REDUCTION INTERNAL FIXATION (ORIF) ANKLE FRACTURE;  Surgeon: Hessie Knows, MD;  Location: [facility];  Service: Orthopedics;  Laterality: Left;   TOTAL KNEE ARTHROPLASTY Right 11/21/2021   Procedure: TOTAL KNEE ARTHROPLASTY;  Surgeon: Lovell Sheehan, MD;  Location: [facility];  Service: Orthopedics;  Laterality: Right;     MEDICATIONS:   No medications prior to admission.    ALLERGIES:   Allergies  Allergen Reactions   Ezetimibe Other (See Comments)    Joint Pain    Watermelon [Citrullus Vulgaris] Diarrhea    Severe diarrhea within 2 minutes of eating   Adhesive [Tape]    Other Nausea And Vomiting    general anesthesia - deathly ill    Atorvastatin Other (See Comments)    Joint pain   Meloxicam Nausea Only    REVIEW OF SYSTEMS:  Pertinent items are  noted in HPI.   FAMILY HISTORY:   Family History  Problem Relation Age of Onset   Heart disease Father    Cancer Father     SOCIAL HISTORY:   Social History   Tobacco Use   Smoking status: Never   Smokeless tobacco: Never  Substance Use Topics   Alcohol use: Yes    Comment: occassional     EXAMINATION:  Vital signs in last 24 hours:    General appearance: alert, cooperative, and no distress Neck: no JVD and supple, symmetrical, trachea midline Lungs: clear to auscultation bilaterally Heart: regular  rate and rhythm, S1, S2 normal, no murmur, click, rub or gallop Abdomen: soft, non-tender; bowel sounds normal; no masses,  no organomegaly Extremities: extremities normal, atraumatic, no cyanosis or edema and Homans sign is negative, no sign of DVT Pulses: 2+ and symmetric Skin: Skin color, texture, turgor normal. No rashes or lesions  Musculoskeletal:  ROM 0-100, Ligaments intact,  Imaging Review Plain radiographs demonstrate severe degenerative joint disease of the right knee. The overall alignment is significant varus. The bone quality appears to be good for age and reported activity level.  Assessment/Plan: Primary osteoarthritis, right knee   The patient history, physical examination and imaging studies are consistent with advanced degenerative joint disease of the right knee. The patient has failed conservative treatment.  The clearance notes were reviewed.  After discussion with the patient it was felt that Total Knee Replacement was indicated. The procedure,  risks, and benefits of total knee arthroplasty were presented and reviewed. The risks including but not limited to aseptic loosening, infection, blood clots, vascular injury, stiffness, patella tracking problems complications among others were discussed. The patient acknowledged the explanation, agreed to proceed with the plan.  [name] 12/26/2021, 11:20 AM

## 2022-01-01 ENCOUNTER — Ambulatory Visit: Payer: Medicare Other | Admitting: Physical Therapy

## 2022-01-01 ENCOUNTER — Encounter: Payer: Self-pay | Admitting: Physical Therapy

## 2022-01-01 DIAGNOSIS — R262 Difficulty in walking, not elsewhere classified: Secondary | ICD-10-CM

## 2022-01-01 DIAGNOSIS — M25561 Pain in right knee: Secondary | ICD-10-CM

## 2022-01-01 DIAGNOSIS — M6281 Muscle weakness (generalized): Secondary | ICD-10-CM

## 2022-01-01 DIAGNOSIS — R2681 Unsteadiness on feet: Secondary | ICD-10-CM

## 2022-01-01 DIAGNOSIS — R2689 Other abnormalities of gait and mobility: Secondary | ICD-10-CM

## 2022-01-01 NOTE — Therapy (Signed)
OUTPATIENT PHYSICAL THERAPY LOWER EXTREMITY EVALUATION   Patient Name: Leslie Smith MRN: [id number] DOB:Oct 22, 1950, 71 y.o., female Today's Date: 01/01/2022   PT End of Session - 01/01/22 1010     Visit Number 2    Number of Visits 17    Date for PT Re-Evaluation 02/20/22    Authorization Type Medicare    PT Start Time 1012    PT Stop Time 1100    PT Time Calculation (min) 48 min    Equipment Utilized During Treatment Gait belt    Activity Tolerance Patient tolerated treatment well    Behavior During Therapy WFL for tasks assessed/performed             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[place]) 09/15/2014   Bladder incontinence    Cancer ([place]) 2014   brain tumor, resected   Cellulitis and abscess of leg 2019   2 episodes this year   Complication of anesthesia    DJD (degenerative joint disease) of knee 2018   Fall    Fatigue    History of blood transfusion    Hypertension    MS (multiple sclerosis) ([place])    Myelitis ([place])    Neuromuscular disorder ([place]) 2001   multiple sclerosis diagnosed by MRI   PONV (postoperative nausea and vomiting)    severe   Thrombocytopenia ([place])    Past Surgical History:  Procedure Laterality Date   ABDOMINAL HYSTERECTOMY  2011   required wound vac for 6 weeks. ovaries had grown attached to her back bone   BRAIN SURGERY  2015   tumor resection-denies seizures   BREAST SURGERY Left 2014   biopsy. negative   HARDWARE REMOVAL Left 05/05/2018   Procedure: HARDWARE REMOVAL- LEFT ANKLE;  Surgeon: Hessie Knows, MD;  Location: [facility];  Service: Orthopedics;  Laterality: Left;   I & D EXTREMITY Left 08/25/2015   Procedure: IRRIGATION AND DEBRIDEMENT THUMB;  Surgeon: Iran Planas, MD;  Location: [place];  Service: Orthopedics;  Laterality: Left;   ORIF ANKLE FRACTURE Left 02/20/2018   Procedure: OPEN REDUCTION INTERNAL FIXATION (ORIF) ANKLE FRACTURE;  Surgeon: Hessie Knows, MD;  Location: [facility];  Service:  Orthopedics;  Laterality: Left;   TOTAL KNEE ARTHROPLASTY Right 11/21/2021   Procedure: TOTAL KNEE ARTHROPLASTY;  Surgeon: Lovell Sheehan, MD;  Location: [facility];  Service: Orthopedics;  Laterality: Right;   Patient Active Problem List   Diagnosis Date Noted   S/P TKR (total knee replacement) using cement, right 11/21/2021   Atherosclerosis of abdominal aorta ([place]) 01/10/2021   Coronary artery disease involving native coronary artery of native heart 01/10/2021   OSA (obstructive sleep apnea) 04/19/2020   Statin intolerance 01/14/2020   S/P ORIF (open reduction internal fixation) fracture 02/20/2018   DJD (degenerative joint disease) of knee 01/02/2017   Advanced care planning/counseling discussion 01/02/2017   Thrombocytopenia ([place]) 10/30/2015   Multiple sclerosis ([place])    Essential hypertension    Hyperlipidemia    Benign neoplasm of brain ([place]) 04/30/2013   Cerebral meningioma ([place]) 04/29/2013   Malignant hypertension 04/29/2013   Obesity 04/15/2013    PCP: Fulton Reek, MD  REFERRING PROVIDER: Kurtis Bushman MD  REFERRING DIAG: R TKA, 11/21/21  THERAPY DIAG:  Chronic pain of right knee  Muscle weakness (generalized)  Difficulty in walking, not elsewhere classified  Unsteadiness on feet  Other abnormalities of gait and mobility  Rationale for Evaluation and Treatment Rehabilitation  ONSET DATE: 11/21/21  SUBJECTIVE:  SUBJECTIVE STATEMENT: Patient reports, "I am doing better today." She reports yesterday she could tell her fluid was up and she didn't feel well. She did take a pain pill prior to session;   PERTINENT HISTORY: 71 yo Female s/p R TKA on 11/21/21. Patient reports having home health PT for 2-3 weeks. She reports doing well. She presents to therapy without AD. She reports not needing AD at home. She has been sleeping with CPAP and cryocuff on RLE- but has been sleeping well; PMH significant: MS, Sleep apnea, 2014 brain tumor, history of LE cellulitis,  intermittent fluid build up/swelling in LE, DJD, HTN, Thrombocytopenia;  Pt is following up with PCP regarding LE swelling;  Pt did have elevated BP during last bout of therapy- She has not been checking it but reports the home health PT stated it was elevated at times;   PAIN:  Are you having pain? Yes: NPRS scale: 4/10 Pain location: right knee Pain description: dull Aggravating factors: worse in the evening/swelling;  Relieving factors: pain medicine- has limited some; ice;   PRECAUTIONS: Fall Risk  WEIGHT BEARING RESTRICTIONS No  FALLS:  Has patient fallen in last 6 months? Yes. Number of falls 1  LIVING ENVIRONMENT: Lives with: lives alone Lives in: House/apartment Stairs: Yes: Internal: 13 steps; on right going up and External: 3 steps; has ramp Has following equipment at home: Single point cane, Walker - 2 wheeled, Environmental consultant - 4 wheeled, Crutches, bed side commode, and Grab bars  OCCUPATION: Retired Emergency planning/management officer  PLOF: Independent with basic ADLs, Independent with gait, and Independent with transfers  Had PT for MS/imbalance from: 09/27/20-08/02/21 Prior Outcome measures:  DGI: 06/13/21: 19/24 5 times sit<>Stand : 04/11/22: 14.5 sec Timed up and go: 06/13/21: 8.12 sec 6 min walk: 07/31/21: 1100 feet with elevated BP FGA: 06/13/21: 17/30     PATIENT GOALS Improve transfers, increase endurance/activity tolerance, Improve strength Pt would like to be able to clean her house and be more active at home.    OBJECTIVE:   DIAGNOSTIC FINDINGS: Xray- 11/21/21: knee looked stable  PATIENT SURVEYS:  FOTO 46%  COGNITION:  Overall cognitive status: Within functional limits for tasks assessed     SENSATION: WFL  EDEMA:  Not formally assessed;   POSTURE: No Significant postural limitations  PALPATION: Reports moderate tenderness along right medial/posterior knee   LOWER EXTREMITY ROM:  Active ROM Right eval Left eval                          Knee flexion 108 114   Knee extension -5 -2                   (Blank rows = not tested)  LOWER EXTREMITY MMT:  MMT Right eval Left eval  Hip flexion 4/5 4/5  Hip extension    Hip abduction 3+/5 4/5  Hip adduction    Hip internal rotation    Hip external rotation    Knee flexion    Knee extension    Ankle dorsiflexion    Ankle plantarflexion    Ankle inversion    Ankle eversion     (Blank rows = not tested)    FUNCTIONAL TESTS:  5 times sit to stand: 19.95 sec with hands on knees (>15 sec indicates high risk for falls) 10 meter walk test: 10.65 sec (0.98 m/s), limited community ambulator  SLS: R: 3-5 sec  L: 5-8 sec  GAIT: Pt ambulated on level surface  without AD, reciprocal gait pattern, slightly slower gait speed, no foot drag observed this session;     TODAY'S TREATMENT: 01/01/2022  TREATMENT: Warm up on Nustep BUE/BLE seat #10, arm #10, level 1 x3 min (unbilled)- good tolerance;  Leg Press: BLE 55# x10 reps with moderate difficulty- reports feeling like her RLE is not going quite as straight as LLE; RLE only: 25# x10 reps, medium challenge;  Pt does require min A to get RLE on/off equipment;  Patient hooklying on mat table PT performed soft tissue massage to RLE : Scar massage lower scar, cross friction/soft tissue massage x3-4 min with good tolerance; PT performed soft tissue massage to posterior right LE knee/along hamstring tendon, utilizing edge tool for IASTM x5 min with good tolerance; patient able to exhibit better tissue extensibility with less tenderness following soft tissue massage;  RLE quad set 5 sec hold x10 reps; RLE heel slides with therapist providing overpressure x10 reps RLE knee AROM: 2-118 degrees in supine, improved from initial evaluation;   Hooklying: Bolster under BLE:  SAQ 3# x10 reps RLE Bolster removed:  SLR hip flexion with cues for knee extension 3# x10 reps;Moderate difficulty;  Patient sitting:  Hamstring curl green tband: x10 reps LLE,  x5 reps RLE- limited due to pain in posterior leg Sitting hamstring stretch with ankle DF neural glides x10 reps;  Patient continues to have tightness in posterior right LE along hamstrings with trigger points identified. Consider dry needling in future sessions; Patient agreeable     PATIENT EDUCATION:  Education details: plan of care/HEP Person educated: Patient Education method: Explanation, Demonstration, and Verbal cues Education comprehension: verbalized understanding, returned demonstration, verbal cues required, and needs further education   HOME EXERCISE PROGRAM: Continue as given from home health PT Reinforced importance of ROM including quad sets and LAQ/knee flexion;   ASSESSMENT:  CLINICAL IMPRESSION: Pt motivated and participated well within session. She is progressing well with improved RLE knee ROM. She does exhibit trigger points and tightness in right hamstrings which limits exercise tolerance and is moderate to touch; Patient tolerated soft tissue massage well with improvement in ROM/symptoms. She does fatigue with LE strengthening but tolerated exercise well. She does require min VCS for proper exercise technique. Patient would benefit from additional skilled PT intervention to improve strength, balance and gait safety;     OBJECTIVE IMPAIRMENTS Abnormal gait, cardiopulmonary status limiting activity, decreased activity tolerance, decreased balance, decreased endurance, decreased mobility, difficulty walking, decreased ROM, decreased strength, hypomobility, impaired flexibility, and pain.   ACTIVITY LIMITATIONS bending, standing, squatting, stairs, transfers, and locomotion level  PARTICIPATION LIMITATIONS: cleaning, laundry, shopping, community activity, occupation, yard work, and church  PERSONAL FACTORS Age, Fitness, and 3+ comorbidities: MS, Sleep apnea, 2014 brain tumor, history of LE cellulitis, intermittent fluid build up/swelling in LE, DJD, HTN,  Thrombocytopenia;   are also affecting patient's functional outcome.   REHAB POTENTIAL: Good  CLINICAL DECISION MAKING: Stable/uncomplicated  EVALUATION COMPLEXITY: Low   GOALS: Goals reviewed with patient? Yes  SHORT TERM GOALS: Target date: 01/23/2022  Patient will be adherent to HEP at least 3x a week to improve functional strength and balance for better safety at home. Baseline: Doing some exercise from home health Goal status: INITIAL  2.  Patient will improve RLE knee extension to 0 to improve knee ROM for functional tasks such as standing/walking.  Baseline: RLE: -5 degrees in supine Goal status: INITIAL   LONG TERM GOALS: Target date: 02/20/2022   Patient will increase 10 meter walk  test to >1.99ms as to improve gait speed for better community ambulation and to reduce fall risk. Baseline: 0.98 m/s Goal status: INITIAL  2.  Patient will tolerate 5 seconds of single leg stance without loss of balance to improve ability to get in and out of shower safely. Baseline: RLE: 3-5 sec inconsistent Goal status: INITIAL  3.   Patient will be independent with ascend/descend 12 steps using single UE in step over step pattern without LOB to safely get up/down stairs in home. Baseline: Not formally assessed, negotiates one step at a time; limits steps when possible Goal status: INITIAL  4.  Pt will improve RLE knee flexion to >115 degrees for functional ROM for transfers and step negotiation;  Baseline: RLE: 108 degrees Goal status: INITIAL  5.  Patient (> 652years old) will complete five times sit to stand test in < 15 seconds indicating an increased LE strength and improved balance. Baseline: 19.95 sec Goal status: INITIAL  6.  Patient will improve FOTO score by 5% to indicate improved functional mobility with ADLs.  Baseline: 46% Goal status: INITIAL   PLAN: PT FREQUENCY: 2x/week  PT DURATION: 8 weeks  PLANNED INTERVENTIONS: Therapeutic exercises, Therapeutic activity,  Neuromuscular re-education, Balance training, Gait training, Patient/Family education, Self Care, Joint mobilization, Stair training, Dry Needling, Cryotherapy, Moist heat, scar mobilization, Taping, and Manual therapy  PLAN FOR NEXT SESSION: work on ROM/strengthening;    Yonna Alwin, PT, DPT 01/01/2022, 11:07 AM

## 2022-01-03 ENCOUNTER — Encounter: Payer: Self-pay | Admitting: Physical Therapy

## 2022-01-03 ENCOUNTER — Ambulatory Visit: Payer: Medicare Other

## 2022-01-03 ENCOUNTER — Ambulatory Visit: Payer: Medicare Other | Admitting: Physical Therapy

## 2022-01-03 DIAGNOSIS — R269 Unspecified abnormalities of gait and mobility: Secondary | ICD-10-CM

## 2022-01-03 DIAGNOSIS — R262 Difficulty in walking, not elsewhere classified: Secondary | ICD-10-CM

## 2022-01-03 DIAGNOSIS — M25561 Pain in right knee: Secondary | ICD-10-CM | POA: Diagnosis not present

## 2022-01-03 DIAGNOSIS — M6281 Muscle weakness (generalized): Secondary | ICD-10-CM

## 2022-01-03 DIAGNOSIS — R2681 Unsteadiness on feet: Secondary | ICD-10-CM

## 2022-01-03 DIAGNOSIS — R278 Other lack of coordination: Secondary | ICD-10-CM

## 2022-01-03 DIAGNOSIS — R2689 Other abnormalities of gait and mobility: Secondary | ICD-10-CM

## 2022-01-03 NOTE — Therapy (Signed)
OUTPATIENT PHYSICAL THERAPY LOWER EXTREMITY Treatment   Patient Name: Leslie Smith MRN: [id number] DOB:Oct 16, 1950, 71 y.o., female Today's Date: 01/03/2022   PT End of Session - 01/03/22 0812     Visit Number 3    Number of Visits 17    Date for PT Re-Evaluation 02/20/22    Authorization Type Medicare    PT Start Time 0730    PT Stop Time 0845    PT Time Calculation (min) 75 min    Equipment Utilized During Treatment Gait belt    Activity Tolerance Patient tolerated treatment well    Behavior During Therapy WFL for tasks assessed/performed              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[place]) 09/15/2014   Bladder incontinence    Cancer ([place]) 2014   brain tumor, resected   Cellulitis and abscess of leg 2019   2 episodes this year   Complication of anesthesia    DJD (degenerative joint disease) of knee 2018   Fall    Fatigue    History of blood transfusion    Hypertension    MS (multiple sclerosis) ([place])    Myelitis ([place])    Neuromuscular disorder ([place]) 2001   multiple sclerosis diagnosed by MRI   PONV (postoperative nausea and vomiting)    severe   Thrombocytopenia ([place])    Past Surgical History:  Procedure Laterality Date   ABDOMINAL HYSTERECTOMY  2011   required wound vac for 6 weeks. ovaries had grown attached to her back bone   BRAIN SURGERY  2015   tumor resection-denies seizures   BREAST SURGERY Left 2014   biopsy. negative   HARDWARE REMOVAL Left 05/05/2018   Procedure: HARDWARE REMOVAL- LEFT ANKLE;  Surgeon: Hessie Knows, MD;  Location: [facility];  Service: Orthopedics;  Laterality: Left;   I & D EXTREMITY Left 08/25/2015   Procedure: IRRIGATION AND DEBRIDEMENT THUMB;  Surgeon: Iran Planas, MD;  Location: [facility];  Service: Orthopedics;  Laterality: Left;   ORIF ANKLE FRACTURE Left 02/20/2018   Procedure: OPEN REDUCTION INTERNAL FIXATION (ORIF) ANKLE FRACTURE;  Surgeon: Hessie Knows, MD;  Location: [facility];  Service:  Orthopedics;  Laterality: Left;   TOTAL KNEE ARTHROPLASTY Right 11/21/2021   Procedure: TOTAL KNEE ARTHROPLASTY;  Surgeon: Lovell Sheehan, MD;  Location: [facility];  Service: Orthopedics;  Laterality: Right;   Patient Active Problem List   Diagnosis Date Noted   S/P TKR (total knee replacement) using cement, right 11/21/2021   Atherosclerosis of abdominal aorta ([place]) 01/10/2021   Coronary artery disease involving native coronary artery of native heart 01/10/2021   OSA (obstructive sleep apnea) 04/19/2020   Statin intolerance 01/14/2020   S/P ORIF (open reduction internal fixation) fracture 02/20/2018   DJD (degenerative joint disease) of knee 01/02/2017   Advanced care planning/counseling discussion 01/02/2017   Thrombocytopenia ([place]) 10/30/2015   Multiple sclerosis ([place])    Essential hypertension    Hyperlipidemia    Benign neoplasm of brain ([place]) 04/30/2013   Cerebral meningioma ([place]) 04/29/2013   Malignant hypertension 04/29/2013   Obesity 04/15/2013    PCP: Fulton Reek, MD  REFERRING PROVIDER: Kurtis Bushman MD  REFERRING DIAG: R TKA, 11/21/21  THERAPY DIAG:  Chronic pain of right knee  Muscle weakness (generalized)  Difficulty in walking, not elsewhere classified  Unsteadiness on feet  Other abnormalities of gait and mobility  Other lack of coordination  Abnormality of gait and mobility  Rationale for  Evaluation and Treatment Rehabilitation  ONSET DATE: 11/21/21  SUBJECTIVE:   SUBJECTIVE STATEMENT: Patient reports, "I am doing better today." She reports yesterday she could tell her fluid was up and she didn't feel well. She did take a pain pill prior to session;   PERTINENT HISTORY: 71 yo Female s/p R TKA on 11/21/21. Patient reports having home health PT for 2-3 weeks. She reports doing well. She presents to therapy without AD. She reports not needing AD at home. She has been sleeping with CPAP and cryocuff on RLE- but has been sleeping well; PMH significant:  MS, Sleep apnea, 2014 brain tumor, history of LE cellulitis, intermittent fluid build up/swelling in LE, DJD, HTN, Thrombocytopenia;  Pt is following up with PCP regarding LE swelling;  Pt did have elevated BP during last bout of therapy- She has not been checking it but reports the home health PT stated it was elevated at times;   PAIN:  Are you having pain? Yes: NPRS scale: 4/10 Pain location: right knee Pain description: dull Aggravating factors: worse in the evening/swelling;  Relieving factors: pain medicine- has limited some; ice;   PRECAUTIONS: Fall Risk  WEIGHT BEARING RESTRICTIONS No  FALLS:  Has patient fallen in last 6 months? Yes. Number of falls 1  LIVING ENVIRONMENT: Lives with: lives alone Lives in: House/apartment Stairs: Yes: Internal: 13 steps; on right going up and External: 3 steps; has ramp Has following equipment at home: Single point cane, Walker - 2 wheeled, Environmental consultant - 4 wheeled, Crutches, bed side commode, and Grab bars  OCCUPATION: Retired Emergency planning/management officer  PLOF: Independent with basic ADLs, Independent with gait, and Independent with transfers  Had PT for MS/imbalance from: 09/27/20-08/02/21 Prior Outcome measures:  DGI: 06/13/21: 19/24 5 times sit<>Stand : 04/11/22: 14.5 sec Timed up and go: 06/13/21: 8.12 sec 6 min walk: 07/31/21: 1100 feet with elevated BP FGA: 06/13/21: 17/30     PATIENT GOALS Improve transfers, increase endurance/activity tolerance, Improve strength Pt would like to be able to clean her house and be more active at home.    OBJECTIVE:   DIAGNOSTIC FINDINGS: Xray- 11/21/21: knee looked stable  PATIENT SURVEYS:  FOTO 46%  COGNITION:  Overall cognitive status: Within functional limits for tasks assessed     SENSATION: WFL  EDEMA:  Not formally assessed;   POSTURE: No Significant postural limitations  PALPATION: Reports moderate tenderness along right medial/posterior knee   LOWER EXTREMITY ROM:  Active ROM Right eval  Left eval                          Knee flexion 108 114  Knee extension -5 -2                   (Blank rows = not tested)  LOWER EXTREMITY MMT:  MMT Right eval Left eval  Hip flexion 4/5 4/5  Hip extension    Hip abduction 3+/5 4/5  Hip adduction    Hip internal rotation    Hip external rotation    Knee flexion    Knee extension    Ankle dorsiflexion    Ankle plantarflexion    Ankle inversion    Ankle eversion     (Blank rows = not tested)    FUNCTIONAL TESTS:  5 times sit to stand: 19.95 sec with hands on knees (>15 sec indicates high risk for falls) 10 meter walk test: 10.65 sec (0.98 m/s), limited community ambulator  SLS: R: 3-5  sec  L: 5-8 sec  GAIT: Pt ambulated on level surface without AD, reciprocal gait pattern, slightly slower gait speed, no foot drag observed this session;     TODAY'S TREATMENT: 01/03/2022  TREATMENT: Janna Arch PT, DPT performed dry needling  Trigger Point Dry Needling (TDN), unbilled Education performed with patient regarding potential benefit of TDN. Reviewed precautions and risks with patient. Reviewed special precautions/risks over lung fields which include pneumothorax. Reviewed signs and symptoms of pneumothorax and advised pt to go to ER immediately if these symptoms develop advise them of dry needling treatment. Extensive time spent with pt to ensure full understanding of TDN risks. Pt provided verbal consent to treatment. TDN performed to with 0.3 x 60 single needle placements with local twitch response (LTR). Pistoning technique utilized. Improved pain-free motion following intervention. R hamstring, R adductor (x10 min)  Pt hooklying: hamstring curls with green swiss ball Instructed patient in LE SLR hamstring stretch 60 sec hold x2 reps RLE knee to chest stretch 60 sec hold x2 reps;   Seated LAQ with ankle DF x15 reps;  Standing with foot on 2nd step, forward lunge to RLE 5 sec hold x5 reps;  Leg Press: BLE  55# x12 reps with moderate difficulty-  RLE only: 25# x10 reps, medium challenge;  Pt does require min A to get RLE on/off equipment;  Pt prone: PT performed soft tissue massage to right posterior knee along hamstring tendon and calf musculature. Less trigger points identified. Pt does have tenderness along medial tendon. PT utilized edge tool for IASTM x10 min; Patient reports moderate tenderness but with increased soft tissue massage was able to exhibit better tissue extensibility with less tightness.  Patient sitting:  Hamstring curl green tband: x10 reps RLE- Pt continues to have pain in right knee but reports less medial posterior knee pain. She reports her lateral hamstring tendons are bothering her which is improvement;  Standing hamstring stretch with ankle DF foot on step, 20 sec hold x2 reps;  Finished with cryotherapy to right knee x5 min (unbilled);  Pt tolerated well, exhibiting better tissue extensibility and reporting less pain in LE. She is still stiff in knee and requires increased time/effort for LE ROM; Reinforced HEP;     PATIENT EDUCATION:  Education details: plan of care/HEP Person educated: Patient Education method: Explanation, Demonstration, and Verbal cues Education comprehension: verbalized understanding, returned demonstration, verbal cues required, and needs further education   HOME EXERCISE PROGRAM: Continue as given from home health PT Reinforced importance of ROM including quad sets and LAQ/knee flexion;   ASSESSMENT:  CLINICAL IMPRESSION: Pt motivated and participated well within session. She is progressing well with improved RLE knee ROM. She does exhibit trigger points and tightness in right hamstrings which limits exercise tolerance and is moderate to touch; Patient tolerated dry needling well with reduced trigger points after treatment. She was able to exhibit better tissue extensibility with soft tissue massage. Despite improvement, she continues  to have tightness and tenderness to hamstring tendons and posterior knee soft tissue. Patient would benefit from additional skilled PT intervention to improve strength, balance and gait safety;      OBJECTIVE IMPAIRMENTS Abnormal gait, cardiopulmonary status limiting activity, decreased activity tolerance, decreased balance, decreased endurance, decreased mobility, difficulty walking, decreased ROM, decreased strength, hypomobility, impaired flexibility, and pain.   ACTIVITY LIMITATIONS bending, standing, squatting, stairs, transfers, and locomotion level  PARTICIPATION LIMITATIONS: cleaning, laundry, shopping, community activity, occupation, yard work, and church  PERSONAL FACTORS Age, Fitness, and 3+ comorbidities:  MS, Sleep apnea, 2014 brain tumor, history of LE cellulitis, intermittent fluid build up/swelling in LE, DJD, HTN, Thrombocytopenia;   are also affecting patient's functional outcome.   REHAB POTENTIAL: Good  CLINICAL DECISION MAKING: Stable/uncomplicated  EVALUATION COMPLEXITY: Low   GOALS: Goals reviewed with patient? Yes  SHORT TERM GOALS: Target date: 01/23/2022  Patient will be adherent to HEP at least 3x a week to improve functional strength and balance for better safety at home. Baseline: Doing some exercise from home health Goal status: INITIAL  2.  Patient will improve RLE knee extension to 0 to improve knee ROM for functional tasks such as standing/walking.  Baseline: RLE: -5 degrees in supine Goal status: INITIAL   LONG TERM GOALS: Target date: 02/20/2022   Patient will increase 10 meter walk test to >1.16ms as to improve gait speed for better community ambulation and to reduce fall risk. Baseline: 0.98 m/s Goal status: INITIAL  2.  Patient will tolerate 5 seconds of single leg stance without loss of balance to improve ability to get in and out of shower safely. Baseline: RLE: 3-5 sec inconsistent Goal status: INITIAL  3.   Patient will be independent  with ascend/descend 12 steps using single UE in step over step pattern without LOB to safely get up/down stairs in home. Baseline: Not formally assessed, negotiates one step at a time; limits steps when possible Goal status: INITIAL  4.  Pt will improve RLE knee flexion to >115 degrees for functional ROM for transfers and step negotiation;  Baseline: RLE: 108 degrees Goal status: INITIAL  5.  Patient (> 619years old) will complete five times sit to stand test in < 15 seconds indicating an increased LE strength and improved balance. Baseline: 19.95 sec Goal status: INITIAL  6.  Patient will improve FOTO score by 5% to indicate improved functional mobility with ADLs.  Baseline: 46% Goal status: INITIAL   PLAN: PT FREQUENCY: 2x/week  PT DURATION: 8 weeks  PLANNED INTERVENTIONS: Therapeutic exercises, Therapeutic activity, Neuromuscular re-education, Balance training, Gait training, Patient/Family education, Self Care, Joint mobilization, Stair training, Dry Needling, Cryotherapy, Moist heat, scar mobilization, Taping, and Manual therapy  PLAN FOR NEXT SESSION: work on ROM/strengthening;    Margree Gimbel, PT, DPT 01/03/2022, 10:25 AM

## 2022-01-08 ENCOUNTER — Encounter: Payer: Self-pay | Admitting: Physical Therapy

## 2022-01-08 ENCOUNTER — Ambulatory Visit: Payer: Medicare Other | Attending: Orthopedic Surgery | Admitting: Physical Therapy

## 2022-01-08 DIAGNOSIS — R262 Difficulty in walking, not elsewhere classified: Secondary | ICD-10-CM | POA: Diagnosis present

## 2022-01-08 DIAGNOSIS — R278 Other lack of coordination: Secondary | ICD-10-CM | POA: Insufficient documentation

## 2022-01-08 DIAGNOSIS — R269 Unspecified abnormalities of gait and mobility: Secondary | ICD-10-CM | POA: Diagnosis present

## 2022-01-08 DIAGNOSIS — M25561 Pain in right knee: Secondary | ICD-10-CM | POA: Insufficient documentation

## 2022-01-08 DIAGNOSIS — M6281 Muscle weakness (generalized): Secondary | ICD-10-CM | POA: Insufficient documentation

## 2022-01-08 DIAGNOSIS — M25661 Stiffness of right knee, not elsewhere classified: Secondary | ICD-10-CM | POA: Diagnosis present

## 2022-01-08 DIAGNOSIS — R2689 Other abnormalities of gait and mobility: Secondary | ICD-10-CM | POA: Insufficient documentation

## 2022-01-08 DIAGNOSIS — R2681 Unsteadiness on feet: Secondary | ICD-10-CM | POA: Diagnosis present

## 2022-01-08 NOTE — Therapy (Signed)
OUTPATIENT PHYSICAL THERAPY LOWER EXTREMITY Treatment   Patient Name: Leslie Smith MRN: [id number] DOB:Mar 10, 1951, 71 y.o., female Today's Date: 01/08/2022   PT End of Session - 01/08/22 0808     Visit Number 4    Number of Visits 17    Date for PT Re-Evaluation 02/20/22    Authorization Type Medicare    PT Start Time 0804    PT Stop Time 0848    PT Time Calculation (min) 44 min    Equipment Utilized During Treatment Gait belt    Activity Tolerance Patient tolerated treatment well    Behavior During Therapy [facility] for tasks assessed/performed              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[place]) 09/15/2014   Bladder incontinence    Cancer ([place]) 2014   brain tumor, resected   Cellulitis and abscess of leg 2019   2 episodes this year   Complication of anesthesia    DJD (degenerative joint disease) of knee 2018   Fall    Fatigue    History of blood transfusion    Hypertension    MS (multiple sclerosis) ([place])    Myelitis ([place])    Neuromuscular disorder ([place]) 2001   multiple sclerosis diagnosed by MRI   PONV (postoperative nausea and vomiting)    severe   Thrombocytopenia ([place])    Past Surgical History:  Procedure Laterality Date   ABDOMINAL HYSTERECTOMY  2011   required wound vac for 6 weeks. ovaries had grown attached to her back bone   BRAIN SURGERY  2015   tumor resection-denies seizures   BREAST SURGERY Left 2014   biopsy. negative   HARDWARE REMOVAL Left 05/05/2018   Procedure: HARDWARE REMOVAL- LEFT ANKLE;  Surgeon: Hessie Knows, MD;  Location: [facility];  Service: Orthopedics;  Laterality: Left;   I & D EXTREMITY Left 08/25/2015   Procedure: IRRIGATION AND DEBRIDEMENT THUMB;  Surgeon: Iran Planas, MD;  Location: [place];  Service: Orthopedics;  Laterality: Left;   ORIF ANKLE FRACTURE Left 02/20/2018   Procedure: OPEN REDUCTION INTERNAL FIXATION (ORIF) ANKLE FRACTURE;  Surgeon: Hessie Knows, MD;  Location: [facility];  Service:  Orthopedics;  Laterality: Left;   TOTAL KNEE ARTHROPLASTY Right 11/21/2021   Procedure: TOTAL KNEE ARTHROPLASTY;  Surgeon: Lovell Sheehan, MD;  Location: [facility];  Service: Orthopedics;  Laterality: Right;   Patient Active Problem List   Diagnosis Date Noted   S/P TKR (total knee replacement) using cement, right 11/21/2021   Atherosclerosis of abdominal aorta ([place]) 01/10/2021   Coronary artery disease involving native coronary artery of native heart 01/10/2021   OSA (obstructive sleep apnea) 04/19/2020   Statin intolerance 01/14/2020   S/P ORIF (open reduction internal fixation) fracture 02/20/2018   DJD (degenerative joint disease) of knee 01/02/2017   Advanced care planning/counseling discussion 01/02/2017   Thrombocytopenia ([place]) 10/30/2015   Multiple sclerosis ([place])    Essential hypertension    Hyperlipidemia    Benign neoplasm of brain ([place]) 04/30/2013   Cerebral meningioma ([place]) 04/29/2013   Malignant hypertension 04/29/2013   Obesity 04/15/2013    PCP: Fulton Reek, MD  REFERRING PROVIDER: Kurtis Bushman MD  REFERRING DIAG: R TKA, 11/21/21  THERAPY DIAG:  Chronic pain of right knee  Muscle weakness (generalized)  Difficulty in walking, not elsewhere classified  Unsteadiness on feet  Other abnormalities of gait and mobility  Other lack of coordination  Abnormality of gait and mobility  Rationale for  Evaluation and Treatment Rehabilitation  ONSET DATE: 11/21/21  SUBJECTIVE:   SUBJECTIVE STATEMENT: Patient reports, "I am doing better today." She reports yesterday she could tell her fluid was up and she didn't feel well. She did take a pain pill prior to session;   PERTINENT HISTORY: 71 yo Female s/p R TKA on 11/21/21. Patient reports having home health PT for 2-3 weeks. She reports doing well. She presents to therapy without AD. She reports not needing AD at home. She has been sleeping with CPAP and cryocuff on RLE- but has been sleeping well; PMH significant:  MS, Sleep apnea, 2014 brain tumor, history of LE cellulitis, intermittent fluid build up/swelling in LE, DJD, HTN, Thrombocytopenia;  Pt is following up with PCP regarding LE swelling;  Pt did have elevated BP during last bout of therapy- She has not been checking it but reports the home health PT stated it was elevated at times;   PAIN:  Are you having pain? Yes: NPRS scale: 4/10 Pain location: right knee Pain description: dull Aggravating factors: worse in the evening/swelling;  Relieving factors: pain medicine- has limited some; ice;   PRECAUTIONS: Fall Risk  WEIGHT BEARING RESTRICTIONS No  FALLS:  Has patient fallen in last 6 months? Yes. Number of falls 1  LIVING ENVIRONMENT: Lives with: lives alone Lives in: House/apartment Stairs: Yes: Internal: 13 steps; on right going up and External: 3 steps; has ramp Has following equipment at home: Single point cane, Walker - 2 wheeled, Environmental consultant - 4 wheeled, Crutches, bed side commode, and Grab bars  OCCUPATION: Retired Emergency planning/management officer  PLOF: Independent with basic ADLs, Independent with gait, and Independent with transfers  Had PT for MS/imbalance from: 09/27/20-08/02/21 Prior Outcome measures:  DGI: 06/13/21: 19/24 5 times sit<>Stand : 04/11/22: 14.5 sec Timed up and go: 06/13/21: 8.12 sec 6 min walk: 07/31/21: 1100 feet with elevated BP FGA: 06/13/21: 17/30     PATIENT GOALS Improve transfers, increase endurance/activity tolerance, Improve strength Pt would like to be able to clean her house and be more active at home.    OBJECTIVE:   DIAGNOSTIC FINDINGS: Xray- 11/21/21: knee looked stable  PATIENT SURVEYS:  FOTO 46%  COGNITION:  Overall cognitive status: Within functional limits for tasks assessed     SENSATION: WFL  EDEMA:  Not formally assessed;   POSTURE: No Significant postural limitations  PALPATION: Reports moderate tenderness along right medial/posterior knee   LOWER EXTREMITY ROM:  Active ROM Right eval  Left eval                          Knee flexion 108 114  Knee extension -5 -2                   (Blank rows = not tested)  LOWER EXTREMITY MMT:  MMT Right eval Left eval  Hip flexion 4/5 4/5  Hip extension    Hip abduction 3+/5 4/5  Hip adduction    Hip internal rotation    Hip external rotation    Knee flexion    Knee extension    Ankle dorsiflexion    Ankle plantarflexion    Ankle inversion    Ankle eversion     (Blank rows = not tested)    FUNCTIONAL TESTS:  5 times sit to stand: 19.95 sec with hands on knees (>15 sec indicates high risk for falls) 10 meter walk test: 10.65 sec (0.98 m/s), limited community ambulator  SLS: R: 3-5  sec  L: 5-8 sec  GAIT: Pt ambulated on level surface without AD, reciprocal gait pattern, slightly slower gait speed, no foot drag observed this session;     TODAY'S TREATMENT: 01/08/2022  TREATMENT: Pt reports increased soreness in low back as well as posterior right knee;  Pt prone with moist heat to low back and moist heat to right knee during history intake;   Pt prone: PT performed soft tissue massage to right posterior knee along hamstring tendon and calf musculature. Less trigger points identified. Pt does have tenderness along medial and lateral tendon. PT utilized edge tool for IASTM x15 min; Patient reports moderate tenderness but with increased soft tissue massage was able to exhibit better tissue extensibility with less tightness.  Pt transitioned to hooklying with moist heat to low back;   Pt hooklying: Lumbar trunk rotation x10 reps; RLE  SAQ with ankle DF for increased hamstring/calf stretch 3 sec hold x15 reps RLE heel slides x15 reps; Pt reports increased soreness with initial ROM and at end range. She was able to exhibit better ROM with increased repetition;   RLE quad set with towel roll under ankle 5 sec hold x5 reps with moderate stretch felt;  PT performed grade II-III AP mobs tibia on femur with  knee flexed for increased knee ROM 10 sec bouts x5 sets with moderate soreness reported;    PT assessed RLE knee AROM: 0-112 with pain at end range    Patient sitting: Finished with cryotherapy to right knee x5 min (unbilled);  Pt tolerated well, exhibiting better tissue extensibility and reporting less pain in LE. She is still stiff in knee and requires increased time/effort for LE ROM; Reinforced HEP;     PATIENT EDUCATION:  Education details: plan of care/HEP Person educated: Patient Education method: Explanation, Demonstration, and Verbal cues Education comprehension: verbalized understanding, returned demonstration, verbal cues required, and needs further education   HOME EXERCISE PROGRAM: Continue as given from home health PT Reinforced importance of ROM including quad sets and LAQ/knee flexion;   ASSESSMENT:  CLINICAL IMPRESSION: Pt motivated and participated well within session. She is progressing well with improved RLE knee ROM. She does exhibit trigger points and tightness in right hamstrings which limits exercise tolerance and is moderate to touch; She was able to exhibit better tissue extensibility with soft tissue massage. Despite improvement, she continues to have tightness and tenderness to hamstring tendons and posterior knee soft tissue.Consider additional dry needling treatment in the future. She is progressing well with ROM. Pt did see orthopedic MD who has released her- no follow up for 1 year. X-rays were good. Patient would benefit from additional skilled PT intervention to improve strength, balance and gait safety;      OBJECTIVE IMPAIRMENTS Abnormal gait, cardiopulmonary status limiting activity, decreased activity tolerance, decreased balance, decreased endurance, decreased mobility, difficulty walking, decreased ROM, decreased strength, hypomobility, impaired flexibility, and pain.   ACTIVITY LIMITATIONS bending, standing, squatting, stairs, transfers,  and locomotion level  PARTICIPATION LIMITATIONS: cleaning, laundry, shopping, community activity, occupation, yard work, and church  PERSONAL FACTORS Age, Fitness, and 3+ comorbidities: MS, Sleep apnea, 2014 brain tumor, history of LE cellulitis, intermittent fluid build up/swelling in LE, DJD, HTN, Thrombocytopenia;   are also affecting patient's functional outcome.   REHAB POTENTIAL: Good  CLINICAL DECISION MAKING: Stable/uncomplicated  EVALUATION COMPLEXITY: Low   GOALS: Goals reviewed with patient? Yes  SHORT TERM GOALS: Target date: 01/23/2022  Patient will be adherent to HEP at least 3x a week to  improve functional strength and balance for better safety at home. Baseline: Doing some exercise from home health Goal status: INITIAL  2.  Patient will improve RLE knee extension to 0 to improve knee ROM for functional tasks such as standing/walking.  Baseline: RLE: -5 degrees in supine Goal status: INITIAL   LONG TERM GOALS: Target date: 02/20/2022   Patient will increase 10 meter walk test to >1.67ms as to improve gait speed for better community ambulation and to reduce fall risk. Baseline: 0.98 m/s Goal status: INITIAL  2.  Patient will tolerate 5 seconds of single leg stance without loss of balance to improve ability to get in and out of shower safely. Baseline: RLE: 3-5 sec inconsistent Goal status: INITIAL  3.   Patient will be independent with ascend/descend 12 steps using single UE in step over step pattern without LOB to safely get up/down stairs in home. Baseline: Not formally assessed, negotiates one step at a time; limits steps when possible Goal status: INITIAL  4.  Pt will improve RLE knee flexion to >115 degrees for functional ROM for transfers and step negotiation;  Baseline: RLE: 108 degrees Goal status: INITIAL  5.  Patient (> 616years old) will complete five times sit to stand test in < 15 seconds indicating an increased LE strength and improved  balance. Baseline: 19.95 sec Goal status: INITIAL  6.  Patient will improve FOTO score by 5% to indicate improved functional mobility with ADLs.  Baseline: 46% Goal status: INITIAL   PLAN: PT FREQUENCY: 2x/week  PT DURATION: 8 weeks  PLANNED INTERVENTIONS: Therapeutic exercises, Therapeutic activity, Neuromuscular re-education, Balance training, Gait training, Patient/Family education, Self Care, Joint mobilization, Stair training, Dry Needling, Cryotherapy, Moist heat, scar mobilization, Taping, and Manual therapy  PLAN FOR NEXT SESSION: work on ROM/strengthening;    Laresa Oshiro, PT, DPT 01/08/2022, 9:40 AM

## 2022-01-11 ENCOUNTER — Ambulatory Visit: Payer: Medicare Other

## 2022-01-11 DIAGNOSIS — M25661 Stiffness of right knee, not elsewhere classified: Secondary | ICD-10-CM

## 2022-01-11 DIAGNOSIS — M25561 Pain in right knee: Secondary | ICD-10-CM | POA: Diagnosis not present

## 2022-01-11 DIAGNOSIS — M6281 Muscle weakness (generalized): Secondary | ICD-10-CM

## 2022-01-11 NOTE — Therapy (Signed)
OUTPATIENT PHYSICAL THERAPY LOWER EXTREMITY Treatment   Patient Name: Leslie Smith MRN: [id number] DOB:07-01-1950, 71 y.o., female Today's Date: 01/11/2022   PT End of Session - 01/11/22 0931     Visit Number 5    Number of Visits 17    Date for PT Re-Evaluation 02/20/22    Authorization Type Medicare    PT Start Time 0932    PT Stop Time 1017    PT Time Calculation (min) 45 min    Equipment Utilized During Treatment Gait belt    Activity Tolerance Patient tolerated treatment well;Patient limited by pain    Behavior During Therapy [facility] for tasks assessed/performed               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[place]) 09/15/2014   Bladder incontinence    Cancer ([place]) 2014   brain tumor, resected   Cellulitis and abscess of leg 2019   2 episodes this year   Complication of anesthesia    DJD (degenerative joint disease) of knee 2018   Fall    Fatigue    History of blood transfusion    Hypertension    MS (multiple sclerosis) ([place])    Myelitis ([place])    Neuromuscular disorder ([place]) 2001   multiple sclerosis diagnosed by MRI   PONV (postoperative nausea and vomiting)    severe   Thrombocytopenia ([place])    Past Surgical History:  Procedure Laterality Date   ABDOMINAL HYSTERECTOMY  2011   required wound vac for 6 weeks. ovaries had grown attached to her back bone   BRAIN SURGERY  2015   tumor resection-denies seizures   BREAST SURGERY Left 2014   biopsy. negative   HARDWARE REMOVAL Left 05/05/2018   Procedure: HARDWARE REMOVAL- LEFT ANKLE;  Surgeon: Hessie Knows, MD;  Location: [facility];  Service: Orthopedics;  Laterality: Left;   I & D EXTREMITY Left 08/25/2015   Procedure: IRRIGATION AND DEBRIDEMENT THUMB;  Surgeon: Iran Planas, MD;  Location: [place];  Service: Orthopedics;  Laterality: Left;   ORIF ANKLE FRACTURE Left 02/20/2018   Procedure: OPEN REDUCTION INTERNAL FIXATION (ORIF) ANKLE FRACTURE;  Surgeon: Hessie Knows, MD;  Location:  [facility];  Service: Orthopedics;  Laterality: Left;   TOTAL KNEE ARTHROPLASTY Right 11/21/2021   Procedure: TOTAL KNEE ARTHROPLASTY;  Surgeon: Lovell Sheehan, MD;  Location: [facility];  Service: Orthopedics;  Laterality: Right;   Patient Active Problem List   Diagnosis Date Noted   S/P TKR (total knee replacement) using cement, right 11/21/2021   Atherosclerosis of abdominal aorta ([place]) 01/10/2021   Coronary artery disease involving native coronary artery of native heart 01/10/2021   OSA (obstructive sleep apnea) 04/19/2020   Statin intolerance 01/14/2020   S/P ORIF (open reduction internal fixation) fracture 02/20/2018   DJD (degenerative joint disease) of knee 01/02/2017   Advanced care planning/counseling discussion 01/02/2017   Thrombocytopenia ([place]) 10/30/2015   Multiple sclerosis ([place])    Essential hypertension    Hyperlipidemia    Benign neoplasm of brain ([place]) 04/30/2013   Cerebral meningioma ([place]) 04/29/2013   Malignant hypertension 04/29/2013   Obesity 04/15/2013    PCP: Fulton Reek, MD  REFERRING PROVIDER: Kurtis Bushman MD  REFERRING DIAG: R TKA, 11/21/21  THERAPY DIAG:  Muscle weakness (generalized)  Chronic pain of right knee  Stiffness of right knee, not elsewhere classified  Rationale for Evaluation and Treatment Rehabilitation  ONSET DATE: 11/21/21  SUBJECTIVE:   SUBJECTIVE STATEMENT:  Pt reports  current pain level is 5/10. Pt reports R calf muscle still bothers her the most.  Pt reports swelling is better. She has been massaging RLE as home. Pt reports balance is good. Pt reports still having back pain. Feels dry needling has been helping.    PERTINENT HISTORY: 71 yo Female s/p R TKA on 11/21/21. Patient reports having home health PT for 2-3 weeks. She reports doing well. She presents to therapy without AD. She reports not needing AD at home. She has been sleeping with CPAP and cryocuff on RLE- but has been sleeping well; PMH significant: MS, Sleep  apnea, 2014 brain tumor, history of LE cellulitis, intermittent fluid build up/swelling in LE, DJD, HTN, Thrombocytopenia;  Pt is following up with PCP regarding LE swelling;  Pt did have elevated BP during last bout of therapy- She has not been checking it but reports the home health PT stated it was elevated at times;   PAIN:  Are you having pain? Yes: NPRS scale: 5/10 Pain location: right calf Pain description: dull Aggravating factors: worse in the evening/swelling;  Relieving factors: pain medicine- has limited some; ice;   PRECAUTIONS: Fall Risk  WEIGHT BEARING RESTRICTIONS No  FALLS:  Has patient fallen in last 6 months? Yes. Number of falls 1  LIVING ENVIRONMENT: Lives with: lives alone Lives in: House/apartment Stairs: Yes: Internal: 13 steps; on right going up and External: 3 steps; has ramp Has following equipment at home: Single point cane, Walker - 2 wheeled, Environmental consultant - 4 wheeled, Crutches, bed side commode, and Grab bars  OCCUPATION: Retired Emergency planning/management officer  PLOF: Independent with basic ADLs, Independent with gait, and Independent with transfers  Had PT for MS/imbalance from: 09/27/20-08/02/21 Prior Outcome measures:  DGI: 06/13/21: 19/24 5 times sit<>Stand : 04/11/22: 14.5 sec Timed up and go: 06/13/21: 8.12 sec 6 min walk: 07/31/21: 1100 feet with elevated BP FGA: 06/13/21: 17/30   PATIENT GOALS Improve transfers, increase endurance/activity tolerance, Improve strength Pt would like to be able to clean her house and be more active at home.    OBJECTIVE:  Objective data taken at eval unless otherwise specified   DIAGNOSTIC FINDINGS: Xray- 11/21/21: knee looked stable  PATIENT SURVEYS:  FOTO 46%  COGNITION:  Overall cognitive status: Within functional limits for tasks assessed     SENSATION: WFL  EDEMA:  Not formally assessed;   POSTURE: No Significant postural limitations  PALPATION: Reports moderate tenderness along right medial/posterior knee   LOWER  EXTREMITY ROM:  Active ROM Right eval Left eval                          Knee flexion 108 114  Knee extension -5 -2                   (Blank rows = not tested)  LOWER EXTREMITY MMT:  MMT Right eval Left eval  Hip flexion 4/5 4/5  Hip extension    Hip abduction 3+/5 4/5  Hip adduction    Hip internal rotation    Hip external rotation    Knee flexion    Knee extension    Ankle dorsiflexion    Ankle plantarflexion    Ankle inversion    Ankle eversion     (Blank rows = not tested)    FUNCTIONAL TESTS:  5 times sit to stand: 19.95 sec with hands on knees (>15 sec indicates high risk for falls) 10 meter walk test: 10.65 sec (0.98  m/s), limited community ambulator  SLS: R: 3-5 sec  L: 5-8 sec  GAIT: Pt ambulated on level surface without AD, reciprocal gait pattern, slightly slower gait speed, no foot drag observed this session;     TODAY'S TREATMENT: 01/11/2022  TREATMENT:  TherEx Seated BP LUE: 156/63 mmHg HR 79 bpm  LAQ 2x10 RLE. Rates easy  Hamstring curl green tband (latex free):1x12 reps LLE, 1x15 reps RLE- reports feeling a "catch" in her distal R gastrocsoleus region, reports uncomfortable  STS 10x. Limited due to R lower leg pain (no pain in knee)  Hooklying (bolster support on plinth)- SLR 15x 2 sets B  Supine heel slides RLE  2x10.  Reaches approx 105 deg. flexion. Pt reports RLE feels tighter today  Standing R calf stretch 2x30 sec  Standing hip ext 10x B  Standing hip abd 10x2 sets B  Manual:  Pt prone: PT performed IASTM along R calf musculature as well as TrP release to this region (TrP release in 4 bouts of 30-40 sec). While still painful, pt reports feeling fewer TrPs on this date, thinks she has improved since dry needling previous session. 9 min total     PATIENT EDUCATION:  Education details: exercise technique, body mechanics Person educated: Patient Education method: Explanation, Demonstration, and Verbal  cues Education comprehension: verbalized understanding, returned demonstration, verbal cues required, and needs further education   HOME EXERCISE PROGRAM:  No updates on 01/11/2022 Pt to continue HEP as previously given Continue as given from home health PT Reinforced importance of ROM including quad sets and LAQ/knee flexion;   ASSESSMENT:  CLINICAL IMPRESSION: Interventions regressed as pt reports feeling of painful pull in R calf with increased stiffness. Pt thinks this is due to fluctuations in swelling (chronic). PT continued to provide manual therapy to R lower leg, however, with reports of fewer TrPs on this date. Pt achieves approx 105 deg. R knee flex today. The patient would benefit from additional skilled PT intervention to improve strength, balance and gait safety.      OBJECTIVE IMPAIRMENTS Abnormal gait, cardiopulmonary status limiting activity, decreased activity tolerance, decreased balance, decreased endurance, decreased mobility, difficulty walking, decreased ROM, decreased strength, hypomobility, impaired flexibility, and pain.   ACTIVITY LIMITATIONS bending, standing, squatting, stairs, transfers, and locomotion level  PARTICIPATION LIMITATIONS: cleaning, laundry, shopping, community activity, occupation, yard work, and church  PERSONAL FACTORS Age, Fitness, and 3+ comorbidities: MS, Sleep apnea, 2014 brain tumor, history of LE cellulitis, intermittent fluid build up/swelling in LE, DJD, HTN, Thrombocytopenia;   are also affecting patient's functional outcome.   REHAB POTENTIAL: Good  CLINICAL DECISION MAKING: Stable/uncomplicated  EVALUATION COMPLEXITY: Low   GOALS: Goals reviewed with patient? Yes  SHORT TERM GOALS: Target date: 01/23/2022  Patient will be adherent to HEP at least 3x a week to improve functional strength and balance for better safety at home. Baseline: Doing some exercise from home health Goal status: INITIAL  2.  Patient will improve RLE  knee extension to 0 to improve knee ROM for functional tasks such as standing/walking.  Baseline: RLE: -5 degrees in supine Goal status: INITIAL   LONG TERM GOALS: Target date: 02/20/2022   Patient will increase 10 meter walk test to >1.86ms as to improve gait speed for better community ambulation and to reduce fall risk. Baseline: 0.98 m/s Goal status: INITIAL  2.  Patient will tolerate 5 seconds of single leg stance without loss of balance to improve ability to get in and out of shower safely. Baseline: RLE:  3-5 sec inconsistent Goal status: INITIAL  3.   Patient will be independent with ascend/descend 12 steps using single UE in step over step pattern without LOB to safely get up/down stairs in home. Baseline: Not formally assessed, negotiates one step at a time; limits steps when possible Goal status: INITIAL  4.  Pt will improve RLE knee flexion to >115 degrees for functional ROM for transfers and step negotiation;  Baseline: RLE: 108 degrees Goal status: INITIAL  5.  Patient (> 68 years old) will complete five times sit to stand test in < 15 seconds indicating an increased LE strength and improved balance. Baseline: 19.95 sec Goal status: INITIAL  6.  Patient will improve FOTO score by 5% to indicate improved functional mobility with ADLs.  Baseline: 46% Goal status: INITIAL   PLAN: PT FREQUENCY: 2x/week  PT DURATION: 8 weeks  PLANNED INTERVENTIONS: Therapeutic exercises, Therapeutic activity, Neuromuscular re-education, Balance training, Gait training, Patient/Family education, Self Care, Joint mobilization, Stair training, Dry Needling, Cryotherapy, Moist heat, scar mobilization, Taping, and Manual therapy  PLAN FOR NEXT SESSION: work on ROM/strengthening, dry needling, continue plan   Zollie Pee, PT, DPT 01/11/2022, 10:39 AM

## 2022-01-15 ENCOUNTER — Ambulatory Visit: Payer: Medicare Other

## 2022-01-15 ENCOUNTER — Encounter: Payer: Self-pay | Admitting: Physical Therapy

## 2022-01-15 DIAGNOSIS — R2681 Unsteadiness on feet: Secondary | ICD-10-CM

## 2022-01-15 DIAGNOSIS — M25661 Stiffness of right knee, not elsewhere classified: Secondary | ICD-10-CM

## 2022-01-15 DIAGNOSIS — R262 Difficulty in walking, not elsewhere classified: Secondary | ICD-10-CM

## 2022-01-15 DIAGNOSIS — M6281 Muscle weakness (generalized): Secondary | ICD-10-CM

## 2022-01-15 DIAGNOSIS — M25561 Pain in right knee: Secondary | ICD-10-CM | POA: Diagnosis not present

## 2022-01-15 NOTE — Therapy (Signed)
OUTPATIENT PHYSICAL THERAPY LOWER EXTREMITY Treatment   Patient Name: Leslie Smith MRN: [id number] DOB:Mar 28, 1951, 71 y.o., female Today's Date: 01/15/2022   PT End of Session - 01/15/22 1026     Visit Number 6    Number of Visits 17    Date for PT Re-Evaluation 02/20/22    Authorization Type Medicare    PT Start Time 1025    PT Stop Time 1100    PT Time Calculation (min) 35 min    Equipment Utilized During Treatment Gait belt    Activity Tolerance Patient tolerated treatment well    Behavior During Therapy WFL for tasks assessed/performed               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[place]) 09/15/2014   Bladder incontinence    Cancer ([place]) 2014   brain tumor, resected   Cellulitis and abscess of leg 2019   2 episodes this year   Complication of anesthesia    DJD (degenerative joint disease) of knee 2018   Fall    Fatigue    History of blood transfusion    Hypertension    MS (multiple sclerosis) ([place])    Myelitis ([place])    Neuromuscular disorder ([place]) 2001   multiple sclerosis diagnosed by MRI   PONV (postoperative nausea and vomiting)    severe   Thrombocytopenia ([place])    Past Surgical History:  Procedure Laterality Date   ABDOMINAL HYSTERECTOMY  2011   required wound vac for 6 weeks. ovaries had grown attached to her back bone   BRAIN SURGERY  2015   tumor resection-denies seizures   BREAST SURGERY Left 2014   biopsy. negative   HARDWARE REMOVAL Left 05/05/2018   Procedure: HARDWARE REMOVAL- LEFT ANKLE;  Surgeon: Hessie Knows, MD;  Location: [facility];  Service: Orthopedics;  Laterality: Left;   I & D EXTREMITY Left 08/25/2015   Procedure: IRRIGATION AND DEBRIDEMENT THUMB;  Surgeon: Iran Planas, MD;  Location: [place];  Service: Orthopedics;  Laterality: Left;   ORIF ANKLE FRACTURE Left 02/20/2018   Procedure: OPEN REDUCTION INTERNAL FIXATION (ORIF) ANKLE FRACTURE;  Surgeon: Hessie Knows, MD;  Location: [facility];  Service:  Orthopedics;  Laterality: Left;   TOTAL KNEE ARTHROPLASTY Right 11/21/2021   Procedure: TOTAL KNEE ARTHROPLASTY;  Surgeon: Lovell Sheehan, MD;  Location: [facility];  Service: Orthopedics;  Laterality: Right;   Patient Active Problem List   Diagnosis Date Noted   S/P TKR (total knee replacement) using cement, right 11/21/2021   Atherosclerosis of abdominal aorta ([place]) 01/10/2021   Coronary artery disease involving native coronary artery of native heart 01/10/2021   OSA (obstructive sleep apnea) 04/19/2020   Statin intolerance 01/14/2020   S/P ORIF (open reduction internal fixation) fracture 02/20/2018   DJD (degenerative joint disease) of knee 01/02/2017   Advanced care planning/counseling discussion 01/02/2017   Thrombocytopenia ([place]) 10/30/2015   Multiple sclerosis ([place])    Essential hypertension    Hyperlipidemia    Benign neoplasm of brain ([place]) 04/30/2013   Cerebral meningioma ([place]) 04/29/2013   Malignant hypertension 04/29/2013   Obesity 04/15/2013    PCP: Fulton Reek, MD  REFERRING PROVIDER: Kurtis Bushman MD  REFERRING DIAG: R TKA, 11/21/21  THERAPY DIAG:  Muscle weakness (generalized)  Chronic pain of right knee  Stiffness of right knee, not elsewhere classified  Difficulty in walking, not elsewhere classified  Unsteadiness on feet  Rationale for Evaluation and Treatment Rehabilitation  ONSET DATE: 11/21/21  SUBJECTIVE:   SUBJECTIVE STATEMENT:  Pt reports being in a rush this morning leading to inability to take BP meds. Reports knee is feeling great, 3/10 pain in gastroc region.  PERTINENT HISTORY: 71 yo Female s/p R TKA on 11/21/21. Patient reports having home health PT for 2-3 weeks. She reports doing well. She presents to therapy without AD. She reports not needing AD at home. She has been sleeping with CPAP and cryocuff on RLE- but has been sleeping well; PMH significant: MS, Sleep apnea, 2014 brain tumor, history of LE cellulitis, intermittent fluid build  up/swelling in LE, DJD, HTN, Thrombocytopenia;  Pt is following up with PCP regarding LE swelling;  Pt did have elevated BP during last bout of therapy- She has not been checking it but reports the home health PT stated it was elevated at times;   PAIN:  Are you having pain? Yes: NPRS scale: 3/10 Pain location: right calf Pain description: dull Aggravating factors: worse in the evening/swelling;  Relieving factors: pain medicine- has limited some; ice;   PRECAUTIONS: Fall Risk  WEIGHT BEARING RESTRICTIONS No  FALLS:  Has patient fallen in last 6 months? Yes. Number of falls 1  LIVING ENVIRONMENT: Lives with: lives alone Lives in: House/apartment Stairs: Yes: Internal: 13 steps; on right going up and External: 3 steps; has ramp Has following equipment at home: Single point cane, Walker - 2 wheeled, Environmental consultant - 4 wheeled, Crutches, bed side commode, and Grab bars  OCCUPATION: Retired Emergency planning/management officer  PLOF: Independent with basic ADLs, Independent with gait, and Independent with transfers  Had PT for MS/imbalance from: 09/27/20-08/02/21 Prior Outcome measures:  DGI: 06/13/21: 19/24 5 times sit<>Stand : 04/11/22: 14.5 sec Timed up and go: 06/13/21: 8.12 sec 6 min walk: 07/31/21: 1100 feet with elevated BP FGA: 06/13/21: 17/30   PATIENT GOALS Improve transfers, increase endurance/activity tolerance, Improve strength Pt would like to be able to clean her house and be more active at home.    OBJECTIVE:  Objective data taken at eval unless otherwise specified   DIAGNOSTIC FINDINGS: Xray- 11/21/21: knee looked stable  PATIENT SURVEYS:  FOTO 46%  COGNITION:  Overall cognitive status: Within functional limits for tasks assessed     SENSATION: WFL  EDEMA:  Not formally assessed;   POSTURE: No Significant postural limitations  PALPATION: Reports moderate tenderness along right medial/posterior knee   LOWER EXTREMITY ROM:  Active ROM Right eval Left eval                           Knee flexion 108 114  Knee extension -5 -2                   (Blank rows = not tested)  LOWER EXTREMITY MMT:  MMT Right eval Left eval  Hip flexion 4/5 4/5  Hip extension    Hip abduction 3+/5 4/5  Hip adduction    Hip internal rotation    Hip external rotation    Knee flexion    Knee extension    Ankle dorsiflexion    Ankle plantarflexion    Ankle inversion    Ankle eversion     (Blank rows = not tested)    FUNCTIONAL TESTS:  5 times sit to stand: 19.95 sec with hands on knees (>15 sec indicates high risk for falls) 10 meter walk test: 10.65 sec (0.98 m/s), limited community ambulator  SLS: R: 3-5 sec  L: 5-8 sec  GAIT: Pt  ambulated on level surface without AD, reciprocal gait pattern, slightly slower gait speed, no foot drag observed this session;     TODAY'S TREATMENT: 01/15/2022  TREATMENT: Vitals sitting:   BP mechanical LUE: 179/75 mm Hg  HR: 75 BPM    There.ex: Supine: exercises   RLE quad sets with towel under ankle. 2x20, 3 sec holds   RLE heel slide: 2x20  RLE SLR: 1x20, 2x15 with 2# AW    Seated R gastroc stretch with belt:  2x1 minute holds  Mini squats with BUE support for gravity assisted knee flexion and hip extension strengthening: 3x8, mod VC' for hip hinge and use of chair behind pt as visual/tactile cue    BP sitting post squats in LUE: 182/82 mm Hg   Seated exercise:   GTB (latex free) hip ER/ABD: 1x20  GTB (latex free) R hamstring curl: 1x20    BP seated LUE post treatment session: 159/74 mm Hg, HR: 69 BPM    PATIENT EDUCATION:  Education details: exercise technique, body mechanics Person educated: Patient Education method: Consulting civil engineer, Demonstration, and Verbal cues Education comprehension: verbalized understanding, returned demonstration, verbal cues required, and needs further education   HOME EXERCISE PROGRAM:  No updates on 01/11/2022 Pt to continue HEP as previously given Continue as given from home health  PT Reinforced importance of ROM including quad sets and LAQ/knee flexion;   ASSESSMENT:  CLINICAL IMPRESSION: Continuing PT POC with focus on improving RLE mobility and strengthening. Limited in resistance exercise today due to elevated BP readings as pt was unable to take morning dose of BP meds prior to PT. Pt does have less gastroc/soleus pain today which did not limit session. Pt still lacking flexion and TKE AROM and will continue to benefit from skilled PT services to progress R knee AROM to WNL and strength symmetrical to LLE to return to PLOF.        OBJECTIVE IMPAIRMENTS Abnormal gait, cardiopulmonary status limiting activity, decreased activity tolerance, decreased balance, decreased endurance, decreased mobility, difficulty walking, decreased ROM, decreased strength, hypomobility, impaired flexibility, and pain.   ACTIVITY LIMITATIONS bending, standing, squatting, stairs, transfers, and locomotion level  PARTICIPATION LIMITATIONS: cleaning, laundry, shopping, community activity, occupation, yard work, and church  PERSONAL FACTORS Age, Fitness, and 3+ comorbidities: MS, Sleep apnea, 2014 brain tumor, history of LE cellulitis, intermittent fluid build up/swelling in LE, DJD, HTN, Thrombocytopenia;   are also affecting patient's functional outcome.   REHAB POTENTIAL: Good  CLINICAL DECISION MAKING: Stable/uncomplicated  EVALUATION COMPLEXITY: Low   GOALS: Goals reviewed with patient? Yes  SHORT TERM GOALS: Target date: 01/23/2022  Patient will be adherent to HEP at least 3x a week to improve functional strength and balance for better safety at home. Baseline: Doing some exercise from home health Goal status: INITIAL  2.  Patient will improve RLE knee extension to 0 to improve knee ROM for functional tasks such as standing/walking.  Baseline: RLE: -5 degrees in supine Goal status: INITIAL   LONG TERM GOALS: Target date: 02/20/2022   Patient will increase 10 meter walk  test to >1.11ms as to improve gait speed for better community ambulation and to reduce fall risk. Baseline: 0.98 m/s Goal status: INITIAL  2.  Patient will tolerate 5 seconds of single leg stance without loss of balance to improve ability to get in and out of shower safely. Baseline: RLE: 3-5 sec inconsistent Goal status: INITIAL  3.   Patient will be independent with ascend/descend 12 steps using single UE in step  over step pattern without LOB to safely get up/down stairs in home. Baseline: Not formally assessed, negotiates one step at a time; limits steps when possible Goal status: INITIAL  4.  Pt will improve RLE knee flexion to >115 degrees for functional ROM for transfers and step negotiation;  Baseline: RLE: 108 degrees Goal status: INITIAL  5.  Patient (> 74 years old) will complete five times sit to stand test in < 15 seconds indicating an increased LE strength and improved balance. Baseline: 19.95 sec Goal status: INITIAL  6.  Patient will improve FOTO score by 5% to indicate improved functional mobility with ADLs.  Baseline: 46% Goal status: INITIAL   PLAN: PT FREQUENCY: 2x/week  PT DURATION: 8 weeks  PLANNED INTERVENTIONS: Therapeutic exercises, Therapeutic activity, Neuromuscular re-education, Balance training, Gait training, Patient/Family education, Self Care, Joint mobilization, Stair training, Dry Needling, Cryotherapy, Moist heat, scar mobilization, Taping, and Manual therapy  PLAN FOR NEXT SESSION: work on ROM/strengthening, dry needling, continue plan  Salem Caster. Fairly IV, PT, DPT Physical Therapist- [facility]  01/15/2022, 11:01 AM

## 2022-01-17 ENCOUNTER — Ambulatory Visit: Payer: Medicare Other

## 2022-01-17 DIAGNOSIS — M25661 Stiffness of right knee, not elsewhere classified: Secondary | ICD-10-CM

## 2022-01-17 DIAGNOSIS — M25561 Pain in right knee: Secondary | ICD-10-CM

## 2022-01-17 DIAGNOSIS — M6281 Muscle weakness (generalized): Secondary | ICD-10-CM

## 2022-01-17 NOTE — Therapy (Signed)
OUTPATIENT PHYSICAL THERAPY LOWER EXTREMITY Treatment   Patient Name: Leslie Smith MRN: [id number] DOB:1951/05/07, 71 y.o., female Today's Date: 01/17/2022   PT End of Session - 01/17/22 0802     Visit Number 7    Number of Visits 17    Date for PT Re-Evaluation 02/20/22    Authorization Type Medicare    PT Start Time 0803    PT Stop Time 6222    PT Time Calculation (min) 40 min    Equipment Utilized During Treatment Gait belt    Activity Tolerance Patient tolerated treatment well;Patient limited by pain    Behavior During Therapy [facility] for tasks assessed/performed                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[place]) 09/15/2014   Bladder incontinence    Cancer ([place]) 2014   brain tumor, resected   Cellulitis and abscess of leg 2019   2 episodes this year   Complication of anesthesia    DJD (degenerative joint disease) of knee 2018   Fall    Fatigue    History of blood transfusion    Hypertension    MS (multiple sclerosis) ([place])    Myelitis ([place])    Neuromuscular disorder ([place]) 2001   multiple sclerosis diagnosed by MRI   PONV (postoperative nausea and vomiting)    severe   Thrombocytopenia ([place])    Past Surgical History:  Procedure Laterality Date   ABDOMINAL HYSTERECTOMY  2011   required wound vac for 6 weeks. ovaries had grown attached to her back bone   BRAIN SURGERY  2015   tumor resection-denies seizures   BREAST SURGERY Left 2014   biopsy. negative   HARDWARE REMOVAL Left 05/05/2018   Procedure: HARDWARE REMOVAL- LEFT ANKLE;  Surgeon: Hessie Knows, MD;  Location: [facility];  Service: Orthopedics;  Laterality: Left;   I & D EXTREMITY Left 08/25/2015   Procedure: IRRIGATION AND DEBRIDEMENT THUMB;  Surgeon: Iran Planas, MD;  Location: [place];  Service: Orthopedics;  Laterality: Left;   ORIF ANKLE FRACTURE Left 02/20/2018   Procedure: OPEN REDUCTION INTERNAL FIXATION (ORIF) ANKLE FRACTURE;  Surgeon: Hessie Knows, MD;  Location:  [facility];  Service: Orthopedics;  Laterality: Left;   TOTAL KNEE ARTHROPLASTY Right 11/21/2021   Procedure: TOTAL KNEE ARTHROPLASTY;  Surgeon: Lovell Sheehan, MD;  Location: [facility];  Service: Orthopedics;  Laterality: Right;   Patient Active Problem List   Diagnosis Date Noted   S/P TKR (total knee replacement) using cement, right 11/21/2021   Atherosclerosis of abdominal aorta ([place]) 01/10/2021   Coronary artery disease involving native coronary artery of native heart 01/10/2021   OSA (obstructive sleep apnea) 04/19/2020   Statin intolerance 01/14/2020   S/P ORIF (open reduction internal fixation) fracture 02/20/2018   DJD (degenerative joint disease) of knee 01/02/2017   Advanced care planning/counseling discussion 01/02/2017   Thrombocytopenia ([place]) 10/30/2015   Multiple sclerosis ([place])    Essential hypertension    Hyperlipidemia    Benign neoplasm of brain ([place]) 04/30/2013   Cerebral meningioma ([place]) 04/29/2013   Malignant hypertension 04/29/2013   Obesity 04/15/2013    PCP: Fulton Reek, MD  REFERRING PROVIDER: Kurtis Bushman MD  REFERRING DIAG: R TKA, 11/21/21  THERAPY DIAG:  Stiffness of right knee, not elsewhere classified  Chronic pain of right knee  Muscle weakness (generalized)  Rationale for Evaluation and Treatment Rehabilitation  ONSET DATE: 11/21/21  SUBJECTIVE:   SUBJECTIVE STATEMENT:  Pt  slept really well last night. She mowed yesterday. She reports her RLE pain is currently 3/10.  PERTINENT HISTORY: 71 yo Female s/p R TKA on 11/21/21. Patient reports having home health PT for 2-3 weeks. She reports doing well. She presents to therapy without AD. She reports not needing AD at home. She has been sleeping with CPAP and cryocuff on RLE- but has been sleeping well; PMH significant: MS, Sleep apnea, 2014 brain tumor, history of LE cellulitis, intermittent fluid build up/swelling in LE, DJD, HTN, Thrombocytopenia;  Pt is following up with PCP regarding LE  swelling;  Pt did have elevated BP during last bout of therapy- She has not been checking it but reports the home health PT stated it was elevated at times;   PAIN:  Are you having pain? Yes: NPRS scale: 3/10 Pain location: right calf Pain description: dull Aggravating factors: worse in the evening/swelling;  Relieving factors: pain medicine- has limited some; ice;   PRECAUTIONS: Fall Risk  WEIGHT BEARING RESTRICTIONS No  FALLS:  Has patient fallen in last 6 months? Yes. Number of falls 1  LIVING ENVIRONMENT: Lives with: lives alone Lives in: House/apartment Stairs: Yes: Internal: 13 steps; on right going up and External: 3 steps; has ramp Has following equipment at home: Single point cane, Walker - 2 wheeled, Environmental consultant - 4 wheeled, Crutches, bed side commode, and Grab bars  OCCUPATION: Retired Emergency planning/management officer  PLOF: Independent with basic ADLs, Independent with gait, and Independent with transfers  Had PT for MS/imbalance from: 09/27/20-08/02/21 Prior Outcome measures:  DGI: 06/13/21: 19/24 5 times sit<>Stand : 04/11/22: 14.5 sec Timed up and go: 06/13/21: 8.12 sec 6 min walk: 07/31/21: 1100 feet with elevated BP FGA: 06/13/21: 17/30   PATIENT GOALS Improve transfers, increase endurance/activity tolerance, Improve strength Pt would like to be able to clean her house and be more active at home.    OBJECTIVE:  Objective data taken at eval unless otherwise specified   DIAGNOSTIC FINDINGS: Xray- 11/21/21: knee looked stable  PATIENT SURVEYS:  FOTO 46%  COGNITION:  Overall cognitive status: Within functional limits for tasks assessed     SENSATION: WFL  EDEMA:  Not formally assessed;   POSTURE: No Significant postural limitations  PALPATION: Reports moderate tenderness along right medial/posterior knee   LOWER EXTREMITY ROM:  Active ROM Right eval Left eval                          Knee flexion 108 114  Knee extension -5 -2                   (Blank rows = not  tested)  LOWER EXTREMITY MMT:  MMT Right eval Left eval  Hip flexion 4/5 4/5  Hip extension    Hip abduction 3+/5 4/5  Hip adduction    Hip internal rotation    Hip external rotation    Knee flexion    Knee extension    Ankle dorsiflexion    Ankle plantarflexion    Ankle inversion    Ankle eversion     (Blank rows = not tested)    FUNCTIONAL TESTS:  5 times sit to stand: 19.95 sec with hands on knees (>15 sec indicates high risk for falls) 10 meter walk test: 10.65 sec (0.98 m/s), limited community ambulator  SLS: R: 3-5 sec  L: 5-8 sec  GAIT: Pt ambulated on level surface without AD, reciprocal gait pattern, slightly slower gait speed, no foot  drag observed this session;     TODAY'S TREATMENT: 01/17/2022  TREATMENT: Vitals sitting:   BP mechanical LUE:140/59 mm Hg  HR: 59 BPM    There.ex: Supine: exercises   RLE quad sets with towel under ankle. 2x20, 3 sec holds  RLE SLR: 1x15, 1x18 with 2# AW. Pt rates medium.   RLE heel slide: 2x20. Second set provided sheet for pt to use to increase ROM.    Seated R gastroc stretch:  2x1 minute holds  STS 10x  Staggered STS 6x each LE. Rates hard  Standing hip abd 12x1 sets B. Discontinued after one set due to pain  Elastogel applied to R knee while pt performs the following  Seated ankle DF with 3 sec holds 20x B  Seated heel raises with 3 sec holds 20x B   PATIENT EDUCATION:  Education details: exercise technique, body mechanics Person educated: Patient Education method: Consulting civil engineer, Demonstration, and Verbal cues Education comprehension: verbalized understanding, returned demonstration, verbal cues required, and needs further education   HOME EXERCISE PROGRAM:  No updates on 01/17/2022 Pt to continue HEP as previously given Continue as given from home health PT Reinforced importance of ROM including quad sets and LAQ/knee flexion;   ASSESSMENT:  CLINICAL IMPRESSION: PT continues plan with focus on  improving RLE mobility and strengthening. Pt presents with improved BP readings today. However, is still limited with advancing standing therex due to R knee pain. Pt was able to advance STS exercise to more challenging variation without pain, but did rate it difficult. Pt will continue to benefit from skilled PT services to progress R knee AROM to WNL and strength symmetrical to LLE to return to PLOF.      OBJECTIVE IMPAIRMENTS Abnormal gait, cardiopulmonary status limiting activity, decreased activity tolerance, decreased balance, decreased endurance, decreased mobility, difficulty walking, decreased ROM, decreased strength, hypomobility, impaired flexibility, and pain.   ACTIVITY LIMITATIONS bending, standing, squatting, stairs, transfers, and locomotion level  PARTICIPATION LIMITATIONS: cleaning, laundry, shopping, community activity, occupation, yard work, and church  PERSONAL FACTORS Age, Fitness, and 3+ comorbidities: MS, Sleep apnea, 2014 brain tumor, history of LE cellulitis, intermittent fluid build up/swelling in LE, DJD, HTN, Thrombocytopenia;   are also affecting patient's functional outcome.   REHAB POTENTIAL: Good  CLINICAL DECISION MAKING: Stable/uncomplicated  EVALUATION COMPLEXITY: Low   GOALS: Goals reviewed with patient? Yes  SHORT TERM GOALS: Target date: 01/23/2022  Patient will be adherent to HEP at least 3x a week to improve functional strength and balance for better safety at home. Baseline: Doing some exercise from home health Goal status: INITIAL  2.  Patient will improve RLE knee extension to 0 to improve knee ROM for functional tasks such as standing/walking.  Baseline: RLE: -5 degrees in supine Goal status: INITIAL   LONG TERM GOALS: Target date: 02/20/2022   Patient will increase 10 meter walk test to >1.74ms as to improve gait speed for better community ambulation and to reduce fall risk. Baseline: 0.98 m/s Goal status: INITIAL  2.  Patient will  tolerate 5 seconds of single leg stance without loss of balance to improve ability to get in and out of shower safely. Baseline: RLE: 3-5 sec inconsistent Goal status: INITIAL  3.   Patient will be independent with ascend/descend 12 steps using single UE in step over step pattern without LOB to safely get up/down stairs in home. Baseline: Not formally assessed, negotiates one step at a time; limits steps when possible Goal status: INITIAL  4.  Pt  will improve RLE knee flexion to >115 degrees for functional ROM for transfers and step negotiation;  Baseline: RLE: 108 degrees Goal status: INITIAL  5.  Patient (> 42 years old) will complete five times sit to stand test in < 15 seconds indicating an increased LE strength and improved balance. Baseline: 19.95 sec Goal status: INITIAL  6.  Patient will improve FOTO score by 5% to indicate improved functional mobility with ADLs.  Baseline: 46% Goal status: INITIAL   PLAN: PT FREQUENCY: 2x/week  PT DURATION: 8 weeks  PLANNED INTERVENTIONS: Therapeutic exercises, Therapeutic activity, Neuromuscular re-education, Balance training, Gait training, Patient/Family education, Self Care, Joint mobilization, Stair training, Dry Needling, Cryotherapy, Moist heat, scar mobilization, Taping, and Manual therapy  PLAN FOR NEXT SESSION: work on ROM/strengthening, dry needling, continue plan  Ricard Dillon PT, DPT  01/17/2022, 8:45 AM

## 2022-01-21 ENCOUNTER — Ambulatory Visit: Payer: Medicare Other

## 2022-01-21 DIAGNOSIS — M6281 Muscle weakness (generalized): Secondary | ICD-10-CM

## 2022-01-21 DIAGNOSIS — M25561 Pain in right knee: Secondary | ICD-10-CM | POA: Diagnosis not present

## 2022-01-21 DIAGNOSIS — R262 Difficulty in walking, not elsewhere classified: Secondary | ICD-10-CM

## 2022-01-21 NOTE — Therapy (Signed)
OUTPATIENT PHYSICAL THERAPY LOWER EXTREMITY Treatment   Patient Name: Leslie Smith MRN: [id number] DOB:12-21-1950, 71 y.o., female Today's Date: 01/21/2022   PT End of Session - 01/21/22 0805     Visit Number 8    Number of Visits 17    Date for PT Re-Evaluation 02/20/22    Authorization Type Medicare    PT Start Time 0803    PT Stop Time 0846    PT Time Calculation (min) 43 min    Equipment Utilized During Treatment Gait belt    Activity Tolerance Patient tolerated treatment well;Patient limited by pain    Behavior During Therapy [facility] for tasks assessed/performed                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[place]) 09/15/2014   Bladder incontinence    Cancer ([place]) 2014   brain tumor, resected   Cellulitis and abscess of leg 2019   2 episodes this year   Complication of anesthesia    DJD (degenerative joint disease) of knee 2018   Fall    Fatigue    History of blood transfusion    Hypertension    MS (multiple sclerosis) ([place])    Myelitis ([place])    Neuromuscular disorder ([place]) 2001   multiple sclerosis diagnosed by MRI   PONV (postoperative nausea and vomiting)    severe   Thrombocytopenia ([place])    Past Surgical History:  Procedure Laterality Date   ABDOMINAL HYSTERECTOMY  2011   required wound vac for 6 weeks. ovaries had grown attached to her back bone   BRAIN SURGERY  2015   tumor resection-denies seizures   BREAST SURGERY Left 2014   biopsy. negative   HARDWARE REMOVAL Left 05/05/2018   Procedure: HARDWARE REMOVAL- LEFT ANKLE;  Surgeon: Hessie Knows, MD;  Location: [facility];  Service: Orthopedics;  Laterality: Left;   I & D EXTREMITY Left 08/25/2015   Procedure: IRRIGATION AND DEBRIDEMENT THUMB;  Surgeon: Iran Planas, MD;  Location: [place];  Service: Orthopedics;  Laterality: Left;   ORIF ANKLE FRACTURE Left 02/20/2018   Procedure: OPEN REDUCTION INTERNAL FIXATION (ORIF) ANKLE FRACTURE;  Surgeon: Hessie Knows, MD;  Location:  [facility];  Service: Orthopedics;  Laterality: Left;   TOTAL KNEE ARTHROPLASTY Right 11/21/2021   Procedure: TOTAL KNEE ARTHROPLASTY;  Surgeon: Lovell Sheehan, MD;  Location: [facility];  Service: Orthopedics;  Laterality: Right;   Patient Active Problem List   Diagnosis Date Noted   S/P TKR (total knee replacement) using cement, right 11/21/2021   Atherosclerosis of abdominal aorta ([place]) 01/10/2021   Coronary artery disease involving native coronary artery of native heart 01/10/2021   OSA (obstructive sleep apnea) 04/19/2020   Statin intolerance 01/14/2020   S/P ORIF (open reduction internal fixation) fracture 02/20/2018   DJD (degenerative joint disease) of knee 01/02/2017   Advanced care planning/counseling discussion 01/02/2017   Thrombocytopenia ([place]) 10/30/2015   Multiple sclerosis ([place])    Essential hypertension    Hyperlipidemia    Benign neoplasm of brain ([place]) 04/30/2013   Cerebral meningioma ([place]) 04/29/2013   Malignant hypertension 04/29/2013   Obesity 04/15/2013    PCP: Fulton Reek, MD  REFERRING PROVIDER: Kurtis Bushman MD  REFERRING DIAG: R TKA, 11/21/21  THERAPY DIAG:  Difficulty in walking, not elsewhere classified  Muscle weakness (generalized)  Chronic pain of right knee  Rationale for Evaluation and Treatment Rehabilitation  ONSET DATE: 11/21/21  SUBJECTIVE:   SUBJECTIVE STATEMENT:  Pt notes  she mowed and ever since her knee has been bothering her more.  Pt is hoping to be needled today.  PERTINENT HISTORY: 71 yo Female s/p R TKA on 11/21/21. Patient reports having home health PT for 2-3 weeks. She reports doing well. She presents to therapy without AD. She reports not needing AD at home. She has been sleeping with CPAP and cryocuff on RLE- but has been sleeping well; PMH significant: MS, Sleep apnea, 2014 brain tumor, history of LE cellulitis, intermittent fluid build up/swelling in LE, DJD, HTN, Thrombocytopenia;  Pt is following up with PCP regarding  LE swelling;  Pt did have elevated BP during last bout of therapy- She has not been checking it but reports the home health PT stated it was elevated at times;   PAIN:  Are you having pain? Yes: NPRS scale: 3/10 Pain location: right calf Pain description: dull Aggravating factors: worse in the evening/swelling;  Relieving factors: pain medicine- has limited some; ice;   PRECAUTIONS: Fall Risk  WEIGHT BEARING RESTRICTIONS No  FALLS:  Has patient fallen in last 6 months? Yes. Number of falls 1  LIVING ENVIRONMENT: Lives with: lives alone Lives in: House/apartment Stairs: Yes: Internal: 13 steps; on right going up and External: 3 steps; has ramp Has following equipment at home: Single point cane, Walker - 2 wheeled, Environmental consultant - 4 wheeled, Crutches, bed side commode, and Grab bars  OCCUPATION: Retired Emergency planning/management officer  PLOF: Independent with basic ADLs, Independent with gait, and Independent with transfers  Had PT for MS/imbalance from: 09/27/20-08/02/21 Prior Outcome measures:  DGI: 06/13/21: 19/24 5 times sit<>Stand : 04/11/22: 14.5 sec Timed up and go: 06/13/21: 8.12 sec 6 min walk: 07/31/21: 1100 feet with elevated BP FGA: 06/13/21: 17/30   PATIENT GOALS Improve transfers, increase endurance/activity tolerance, Improve strength Pt would like to be able to clean her house and be more active at home.    OBJECTIVE:  Objective data taken at eval unless otherwise specified   DIAGNOSTIC FINDINGS: Xray- 11/21/21: knee looked stable  PATIENT SURVEYS:  FOTO 46%  COGNITION:  Overall cognitive status: Within functional limits for tasks assessed     SENSATION: WFL  EDEMA:  Not formally assessed;   POSTURE: No Significant postural limitations  PALPATION: Reports moderate tenderness along right medial/posterior knee   LOWER EXTREMITY ROM:  Active ROM Right eval Left eval                          Knee flexion 108 114  Knee extension -5 -2                   (Blank rows =  not tested)  LOWER EXTREMITY MMT:  MMT Right eval Left eval  Hip flexion 4/5 4/5  Hip extension    Hip abduction 3+/5 4/5  Hip adduction    Hip internal rotation    Hip external rotation    Knee flexion    Knee extension    Ankle dorsiflexion    Ankle plantarflexion    Ankle inversion    Ankle eversion     (Blank rows = not tested)    FUNCTIONAL TESTS:  5 times sit to stand: 19.95 sec with hands on knees (>15 sec indicates high risk for falls) 10 meter walk test: 10.65 sec (0.98 m/s), limited community ambulator  SLS: R: 3-5 sec  L: 5-8 sec  GAIT: Pt ambulated on level surface without AD, reciprocal gait pattern, slightly slower  gait speed, no foot drag observed this session;     TODAY'S TREATMENT: 01/21/2022  TREATMENT:   There.ex:  Supine: exercises   RLE quad sets with towel under ankle. 2x20, 3 sec holds  RLE SLR: 1x15, 1x18 with 2# AW. RLE heel slide: 2x20. Second set provided sheet for pt to use to increase ROM.  STS 2x10   Manual:   IASTM applied to distal hamstrings and gastroc soleus complex for increased tissue extensibility and reduction in pain, while pt in prone.  Extensive TP's noted in the semimembranosus and semitendinosus that were targeted during STM.   Trigger Point Dry Needling (TDN), unbilled Education performed with patient regarding potential benefit of TDN. Reviewed precautions and risks with patient. Reviewed special precautions/risks over lung fields which include pneumothorax. Reviewed signs and symptoms of pneumothorax and advised pt to go to ER immediately if these symptoms develop advise them of dry needling treatment. Extensive time spent with pt to ensure full understanding of TDN risks. Pt provided verbal consent to treatment. TDN performed to  with 0.3 x 60 single needle placements with local twitch response (LTR). Pistoning technique utilized. Improved pain-free motion following intervention.  3 total needles utilized for  gastrocs/soleus complex and semimembranosus, and semitendinosus.       PATIENT EDUCATION:  Education details: exercise technique, body mechanics Person educated: Patient Education method: Explanation, Demonstration, and Verbal cues Education comprehension: verbalized understanding, returned demonstration, verbal cues required, and needs further education   HOME EXERCISE PROGRAM:  No updates on 01/21/2022 Pt to continue HEP as previously given Continue as given from home health PT Reinforced importance of ROM including quad sets and LAQ/knee flexion;   ASSESSMENT:  CLINICAL IMPRESSION:  Pt responded well to overall treatment session and was noted to have a slight reduction in pain in the LE's.  Pt still feeling extremely tight in the region, but is making good progress with overall strength and is able to perform SLR without any extension lag.  Pt to continue with current HEP and skilled therapy to address remaining deficits.       OBJECTIVE IMPAIRMENTS Abnormal gait, cardiopulmonary status limiting activity, decreased activity tolerance, decreased balance, decreased endurance, decreased mobility, difficulty walking, decreased ROM, decreased strength, hypomobility, impaired flexibility, and pain.   ACTIVITY LIMITATIONS bending, standing, squatting, stairs, transfers, and locomotion level  PARTICIPATION LIMITATIONS: cleaning, laundry, shopping, community activity, occupation, yard work, and church  PERSONAL FACTORS Age, Fitness, and 3+ comorbidities: MS, Sleep apnea, 2014 brain tumor, history of LE cellulitis, intermittent fluid build up/swelling in LE, DJD, HTN, Thrombocytopenia;   are also affecting patient's functional outcome.   REHAB POTENTIAL: Good  CLINICAL DECISION MAKING: Stable/uncomplicated  EVALUATION COMPLEXITY: Low   GOALS: Goals reviewed with patient? Yes  SHORT TERM GOALS: Target date: 01/23/2022  Patient will be adherent to HEP at least 3x a week to improve  functional strength and balance for better safety at home. Baseline: Doing some exercise from home health Goal status: INITIAL  2.  Patient will improve RLE knee extension to 0 to improve knee ROM for functional tasks such as standing/walking.  Baseline: RLE: -5 degrees in supine Goal status: INITIAL   LONG TERM GOALS: Target date: 02/20/2022   Patient will increase 10 meter walk test to >1.49ms as to improve gait speed for better community ambulation and to reduce fall risk. Baseline: 0.98 m/s Goal status: INITIAL  2.  Patient will tolerate 5 seconds of single leg stance without loss of balance to improve ability to  get in and out of shower safely. Baseline: RLE: 3-5 sec inconsistent Goal status: INITIAL  3.   Patient will be independent with ascend/descend 12 steps using single UE in step over step pattern without LOB to safely get up/down stairs in home. Baseline: Not formally assessed, negotiates one step at a time; limits steps when possible Goal status: INITIAL  4.  Pt will improve RLE knee flexion to >115 degrees for functional ROM for transfers and step negotiation;  Baseline: RLE: 108 degrees Goal status: INITIAL  5.  Patient (> 56 years old) will complete five times sit to stand test in < 15 seconds indicating an increased LE strength and improved balance. Baseline: 19.95 sec Goal status: INITIAL  6.  Patient will improve FOTO score by 5% to indicate improved functional mobility with ADLs.  Baseline: 46% Goal status: INITIAL   PLAN: PT FREQUENCY: 2x/week  PT DURATION: 8 weeks  PLANNED INTERVENTIONS: Therapeutic exercises, Therapeutic activity, Neuromuscular re-education, Balance training, Gait training, Patient/Family education, Self Care, Joint mobilization, Stair training, Dry Needling, Cryotherapy, Moist heat, scar mobilization, Taping, and Manual therapy  PLAN FOR NEXT SESSION: work on ROM/strengthening, dry needling, continue plan   Gwenlyn Saran, PT,  DPT 01/21/22, 10:53 AM

## 2022-01-22 ENCOUNTER — Encounter: Payer: Medicare Other | Admitting: Physical Therapy

## 2022-01-25 ENCOUNTER — Ambulatory Visit: Payer: Medicare Other

## 2022-01-25 DIAGNOSIS — M25561 Pain in right knee: Secondary | ICD-10-CM

## 2022-01-25 DIAGNOSIS — M25661 Stiffness of right knee, not elsewhere classified: Secondary | ICD-10-CM

## 2022-01-25 DIAGNOSIS — M6281 Muscle weakness (generalized): Secondary | ICD-10-CM

## 2022-01-25 NOTE — Therapy (Signed)
OUTPATIENT PHYSICAL THERAPY LOWER EXTREMITY Treatment   Patient Name: Leslie Smith MRN: [id number] DOB:Mar 25, 1951, 71 y.o., female Today's Date: 01/25/2022   PT End of Session - 01/25/22 1114     Visit Number 9    Number of Visits 17    Date for PT Re-Evaluation 02/20/22    Authorization Type Medicare    PT Start Time 0933    PT Stop Time 0093    PT Time Calculation (min) 42 min    Equipment Utilized During Treatment Gait belt    Activity Tolerance Patient tolerated treatment well    Behavior During Therapy WFL for tasks assessed/performed                 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[place]) 09/15/2014   Bladder incontinence    Cancer ([place]) 2014   brain tumor, resected   Cellulitis and abscess of leg 2019   2 episodes this year   Complication of anesthesia    DJD (degenerative joint disease) of knee 2018   Fall    Fatigue    History of blood transfusion    Hypertension    MS (multiple sclerosis) ([place])    Myelitis ([place])    Neuromuscular disorder ([place]) 2001   multiple sclerosis diagnosed by MRI   PONV (postoperative nausea and vomiting)    severe   Thrombocytopenia ([place])    Past Surgical History:  Procedure Laterality Date   ABDOMINAL HYSTERECTOMY  2011   required wound vac for 6 weeks. ovaries had grown attached to her back bone   BRAIN SURGERY  2015   tumor resection-denies seizures   BREAST SURGERY Left 2014   biopsy. negative   HARDWARE REMOVAL Left 05/05/2018   Procedure: HARDWARE REMOVAL- LEFT ANKLE;  Surgeon: Hessie Knows, MD;  Location: [facility];  Service: Orthopedics;  Laterality: Left;   I & D EXTREMITY Left 08/25/2015   Procedure: IRRIGATION AND DEBRIDEMENT THUMB;  Surgeon: Iran Planas, MD;  Location: [place];  Service: Orthopedics;  Laterality: Left;   ORIF ANKLE FRACTURE Left 02/20/2018   Procedure: OPEN REDUCTION INTERNAL FIXATION (ORIF) ANKLE FRACTURE;  Surgeon: Hessie Knows, MD;  Location: [facility];  Service:  Orthopedics;  Laterality: Left;   TOTAL KNEE ARTHROPLASTY Right 11/21/2021   Procedure: TOTAL KNEE ARTHROPLASTY;  Surgeon: Lovell Sheehan, MD;  Location: [facility];  Service: Orthopedics;  Laterality: Right;   Patient Active Problem List   Diagnosis Date Noted   S/P TKR (total knee replacement) using cement, right 11/21/2021   Atherosclerosis of abdominal aorta ([place]) 01/10/2021   Coronary artery disease involving native coronary artery of native heart 01/10/2021   OSA (obstructive sleep apnea) 04/19/2020   Statin intolerance 01/14/2020   S/P ORIF (open reduction internal fixation) fracture 02/20/2018   DJD (degenerative joint disease) of knee 01/02/2017   Advanced care planning/counseling discussion 01/02/2017   Thrombocytopenia ([place]) 10/30/2015   Multiple sclerosis ([place])    Essential hypertension    Hyperlipidemia    Benign neoplasm of brain ([place]) 04/30/2013   Cerebral meningioma ([place]) 04/29/2013   Malignant hypertension 04/29/2013   Obesity 04/15/2013    PCP: Fulton Reek, MD  REFERRING PROVIDER: Kurtis Bushman MD  REFERRING DIAG: R TKA, 11/21/21  THERAPY DIAG:  Stiffness of right knee, not elsewhere classified  Chronic pain of right knee  Muscle weakness (generalized)  Rationale for Evaluation and Treatment Rehabilitation  ONSET DATE: 11/21/21  SUBJECTIVE:   SUBJECTIVE STATEMENT: Pt reports pain "all  over." She reports she took pain medication prior to appointment. Mowed yesterday. Prior to pain medication rates pain as 6/10. Currently 3/10.  PERTINENT HISTORY: 71 yo Female s/p R TKA on 11/21/21. Patient reports having home health PT for 2-3 weeks. She reports doing well. She presents to therapy without AD. She reports not needing AD at home. She has been sleeping with CPAP and cryocuff on RLE- but has been sleeping well; PMH significant: MS, Sleep apnea, 2014 brain tumor, history of LE cellulitis, intermittent fluid build up/swelling in LE, DJD, HTN, Thrombocytopenia;   Pt is following up with PCP regarding LE swelling;  Pt did have elevated BP during last bout of therapy- She has not been checking it but reports the home health PT stated it was elevated at times;   PAIN:  Are you having pain? Yes: NPRS scale: 3/10 Pain location: right calf Pain description: dull Aggravating factors: worse in the evening/swelling;  Relieving factors: pain medicine- has limited some; ice;   PRECAUTIONS: Fall Risk  WEIGHT BEARING RESTRICTIONS No  FALLS:  Has patient fallen in last 6 months? Yes. Number of falls 1  LIVING ENVIRONMENT: Lives with: lives alone Lives in: House/apartment Stairs: Yes: Internal: 13 steps; on right going up and External: 3 steps; has ramp Has following equipment at home: Single point cane, Walker - 2 wheeled, Environmental consultant - 4 wheeled, Crutches, bed side commode, and Grab bars  OCCUPATION: Retired Emergency planning/management officer  PLOF: Independent with basic ADLs, Independent with gait, and Independent with transfers  Had PT for MS/imbalance from: 09/27/20-08/02/21 Prior Outcome measures:  DGI: 06/13/21: 19/24 5 times sit<>Stand : 04/11/22: 14.5 sec Timed up and go: 06/13/21: 8.12 sec 6 min walk: 07/31/21: 1100 feet with elevated BP FGA: 06/13/21: 17/30   PATIENT GOALS Improve transfers, increase endurance/activity tolerance, Improve strength Pt would like to be able to clean her house and be more active at home.    OBJECTIVE:  Objective data taken at eval unless otherwise specified   DIAGNOSTIC FINDINGS: Xray- 11/21/21: knee looked stable  PATIENT SURVEYS:  FOTO 46%  COGNITION:  Overall cognitive status: Within functional limits for tasks assessed     SENSATION: WFL  EDEMA:  Not formally assessed;   POSTURE: No Significant postural limitations  PALPATION: Reports moderate tenderness along right medial/posterior knee   LOWER EXTREMITY ROM:  Active ROM Right eval Left eval                          Knee flexion 108 114  Knee extension -5  -2                   (Blank rows = not tested)  LOWER EXTREMITY MMT:  MMT Right eval Left eval  Hip flexion 4/5 4/5  Hip extension    Hip abduction 3+/5 4/5  Hip adduction    Hip internal rotation    Hip external rotation    Knee flexion    Knee extension    Ankle dorsiflexion    Ankle plantarflexion    Ankle inversion    Ankle eversion     (Blank rows = not tested)    FUNCTIONAL TESTS:  5 times sit to stand: 19.95 sec with hands on knees (>15 sec indicates high risk for falls) 10 meter walk test: 10.65 sec (0.98 m/s), limited community ambulator  SLS: R: 3-5 sec  L: 5-8 sec  GAIT: Pt ambulated on level surface without AD, reciprocal gait pattern,  slightly slower gait speed, no foot drag observed this session;     TODAY'S TREATMENT: 01/25/2022  TREATMENT:  There.ex:  Supine: exercises   RLE quad sets with towel under ankle. 2x10, 3 sec holds  RLE SLR: 2x10 each LE with 2.5# AW. RLE heel slide: 15x followed by 4 additional reps with PT assisting pt to increased end range and 15 sec hold at end range within pain tolerance Side-lying hip abd 2x10 each LE    Manual:   IASTM applied to gastroc soleus complex for increased tissue extensibility and reduction in pain, while pt in prone.  Addition of TrP release as pt continues to demonstrate extensive TP's in this region. X several minutes.    PATIENT EDUCATION:  Education details: exercise technique, body mechanics Person educated: Patient Education method: Explanation, Demonstration, and Verbal cues Education comprehension: verbalized understanding, returned demonstration, verbal cues required, and needs further education   HOME EXERCISE PROGRAM:  No updates on 01/21/2022 Pt to continue HEP as previously given Continue as given from home health PT Reinforced importance of ROM including quad sets and LAQ/knee flexion;   ASSESSMENT:  CLINICAL IMPRESSION:  Continued interventions as laid out in previous  session. Pt exhibits full R knee ext., still somewhat pain limited with R knee flex and reports sensation of stiffness. PT assisted pt into further knee flex end range and stretch to tolerance. Continued providing manual to R gastrocsol complex as pt with TrPs throughout this region. Pt to continue with current HEP and skilled therapy to address remaining deficits.       OBJECTIVE IMPAIRMENTS Abnormal gait, cardiopulmonary status limiting activity, decreased activity tolerance, decreased balance, decreased endurance, decreased mobility, difficulty walking, decreased ROM, decreased strength, hypomobility, impaired flexibility, and pain.   ACTIVITY LIMITATIONS bending, standing, squatting, stairs, transfers, and locomotion level  PARTICIPATION LIMITATIONS: cleaning, laundry, shopping, community activity, occupation, yard work, and church  PERSONAL FACTORS Age, Fitness, and 3+ comorbidities: MS, Sleep apnea, 2014 brain tumor, history of LE cellulitis, intermittent fluid build up/swelling in LE, DJD, HTN, Thrombocytopenia;   are also affecting patient's functional outcome.   REHAB POTENTIAL: Good  CLINICAL DECISION MAKING: Stable/uncomplicated  EVALUATION COMPLEXITY: Low   GOALS: Goals reviewed with patient? Yes  SHORT TERM GOALS: Target date: 01/23/2022  Patient will be adherent to HEP at least 3x a week to improve functional strength and balance for better safety at home. Baseline: Doing some exercise from home health Goal status: INITIAL  2.  Patient will improve RLE knee extension to 0 to improve knee ROM for functional tasks such as standing/walking.  Baseline: RLE: -5 degrees in supine Goal status: INITIAL   LONG TERM GOALS: Target date: 02/20/2022   Patient will increase 10 meter walk test to >1.106ms as to improve gait speed for better community ambulation and to reduce fall risk. Baseline: 0.98 m/s Goal status: INITIAL  2.  Patient will tolerate 5 seconds of single leg stance  without loss of balance to improve ability to get in and out of shower safely. Baseline: RLE: 3-5 sec inconsistent Goal status: INITIAL  3.   Patient will be independent with ascend/descend 12 steps using single UE in step over step pattern without LOB to safely get up/down stairs in home. Baseline: Not formally assessed, negotiates one step at a time; limits steps when possible Goal status: INITIAL  4.  Pt will improve RLE knee flexion to >115 degrees for functional ROM for transfers and step negotiation;  Baseline: RLE: 108 degrees Goal status:  INITIAL  5.  Patient (> 46 years old) will complete five times sit to stand test in < 15 seconds indicating an increased LE strength and improved balance. Baseline: 19.95 sec Goal status: INITIAL  6.  Patient will improve FOTO score by 5% to indicate improved functional mobility with ADLs.  Baseline: 46% Goal status: INITIAL   PLAN: PT FREQUENCY: 2x/week  PT DURATION: 8 weeks  PLANNED INTERVENTIONS: Therapeutic exercises, Therapeutic activity, Neuromuscular re-education, Balance training, Gait training, Patient/Family education, Self Care, Joint mobilization, Stair training, Dry Needling, Cryotherapy, Moist heat, scar mobilization, Taping, and Manual therapy  PLAN FOR NEXT SESSION: work on ROM/strengthening, dry needling, continue plan   Ricard Dillon PT, DPT  01/25/22, 11:19 AM

## 2022-01-28 NOTE — Therapy (Signed)
OUTPATIENT PHYSICAL THERAPY LOWER EXTREMITY Treatment/ Physical Therapy Progress Note   Dates of reporting period  12/25/21   to   01/29/22    Patient Name: Leslie Smith MRN: [id number] DOB:Jan 29, 1951, 71 y.o., female Today's Date: 01/29/2022   PT End of Session - 01/29/22 0855     Visit Number 10    Number of Visits 17    Date for PT Re-Evaluation 02/20/22    Authorization Type Medicare; 8/22: 1/10 PN    PT Start Time 0855    PT Stop Time 0929    PT Time Calculation (min) 34 min    Equipment Utilized During Treatment Gait belt    Activity Tolerance Patient tolerated treatment well    Behavior During Therapy [facility] for tasks assessed/performed                  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[place]) 09/15/2014   Bladder incontinence    Cancer ([place]) 2014   brain tumor, resected   Cellulitis and abscess of leg 2019   2 episodes this year   Complication of anesthesia    DJD (degenerative joint disease) of knee 2018   Fall    Fatigue    History of blood transfusion    Hypertension    MS (multiple sclerosis) ([place])    Myelitis ([place])    Neuromuscular disorder ([place]) 2001   multiple sclerosis diagnosed by MRI   PONV (postoperative nausea and vomiting)    severe   Thrombocytopenia ([place])    Past Surgical History:  Procedure Laterality Date   ABDOMINAL HYSTERECTOMY  2011   required wound vac for 6 weeks. ovaries had grown attached to her back bone   BRAIN SURGERY  2015   tumor resection-denies seizures   BREAST SURGERY Left 2014   biopsy. negative   HARDWARE REMOVAL Left 05/05/2018   Procedure: HARDWARE REMOVAL- LEFT ANKLE;  Surgeon: Hessie Knows, MD;  Location: [facility];  Service: Orthopedics;  Laterality: Left;   I & D EXTREMITY Left 08/25/2015   Procedure: IRRIGATION AND DEBRIDEMENT THUMB;  Surgeon: Iran Planas, MD;  Location: [place];  Service: Orthopedics;  Laterality: Left;   ORIF ANKLE FRACTURE Left 02/20/2018   Procedure: OPEN  REDUCTION INTERNAL FIXATION (ORIF) ANKLE FRACTURE;  Surgeon: Hessie Knows, MD;  Location: [facility];  Service: Orthopedics;  Laterality: Left;   TOTAL KNEE ARTHROPLASTY Right 11/21/2021   Procedure: TOTAL KNEE ARTHROPLASTY;  Surgeon: Lovell Sheehan, MD;  Location: [facility];  Service: Orthopedics;  Laterality: Right;   Patient Active Problem List   Diagnosis Date Noted   S/P TKR (total knee replacement) using cement, right 11/21/2021   Atherosclerosis of abdominal aorta ([place]) 01/10/2021   Coronary artery disease involving native coronary artery of native heart 01/10/2021   OSA (obstructive sleep apnea) 04/19/2020   Statin intolerance 01/14/2020   S/P ORIF (open reduction internal fixation) fracture 02/20/2018   DJD (degenerative joint disease) of knee 01/02/2017   Advanced care planning/counseling discussion 01/02/2017   Thrombocytopenia ([place]) 10/30/2015   Multiple sclerosis ([place])    Essential hypertension    Hyperlipidemia    Benign neoplasm of brain ([place]) 04/30/2013   Cerebral meningioma ([place]) 04/29/2013   Malignant hypertension 04/29/2013   Obesity 04/15/2013    PCP: Fulton Reek, MD  REFERRING PROVIDER: Kurtis Bushman MD  REFERRING DIAG: R TKA, 11/21/21  THERAPY DIAG:  Stiffness of right knee, not elsewhere classified  Chronic pain of right knee  Rationale  for Evaluation and Treatment Rehabilitation  ONSET DATE: 11/21/21  SUBJECTIVE:   SUBJECTIVE STATEMENT: Patient is late to session. Has not been having severe hamstring cramping anymore but still present occasionally.   PERTINENT HISTORY: 71 yo Female s/p R TKA on 11/21/21. Patient reports having home health PT for 2-3 weeks. She reports doing well. She presents to therapy without AD. She reports not needing AD at home. She has been sleeping with CPAP and cryocuff on RLE- but has been sleeping well; PMH significant: MS, Sleep apnea, 2014 brain tumor, history of LE cellulitis, intermittent fluid build up/swelling in LE,  DJD, HTN, Thrombocytopenia;  Pt is following up with PCP regarding LE swelling;  Pt did have elevated BP during last bout of therapy- She has not been checking it but reports the home health PT stated it was elevated at times;   PAIN:  Are you having pain? Yes: NPRS scale: 3/10 Pain location: right calf Pain description: dull Aggravating factors: worse in the evening/swelling;  Relieving factors: pain medicine- has limited some; ice;   PRECAUTIONS: Fall Risk  WEIGHT BEARING RESTRICTIONS No  FALLS:  Has patient fallen in last 6 months? Yes. Number of falls 1  LIVING ENVIRONMENT: Lives with: lives alone Lives in: House/apartment Stairs: Yes: Internal: 13 steps; on right going up and External: 3 steps; has ramp Has following equipment at home: Single point cane, Walker - 2 wheeled, Environmental consultant - 4 wheeled, Crutches, bed side commode, and Grab bars  OCCUPATION: Retired Emergency planning/management officer  PLOF: Independent with basic ADLs, Independent with gait, and Independent with transfers  Had PT for MS/imbalance from: 09/27/20-08/02/21 Prior Outcome measures:  DGI: 06/13/21: 19/24 5 times sit<>Stand : 04/11/22: 14.5 sec Timed up and go: 06/13/21: 8.12 sec 6 min walk: 07/31/21: 1100 feet with elevated BP FGA: 06/13/21: 17/30   PATIENT GOALS Improve transfers, increase endurance/activity tolerance, Improve strength Pt would like to be able to clean her house and be more active at home.    OBJECTIVE:  Objective data taken at eval unless otherwise specified   DIAGNOSTIC FINDINGS: Xray- 11/21/21: knee looked stable  PATIENT SURVEYS:  FOTO 46%  COGNITION:  Overall cognitive status: Within functional limits for tasks assessed     SENSATION: WFL  EDEMA:  Not formally assessed;   POSTURE: No Significant postural limitations  PALPATION: Reports moderate tenderness along right medial/posterior knee   LOWER EXTREMITY ROM:  Active ROM Right eval Left eval                          Knee flexion  108 114  Knee extension -5 -2                   (Blank rows = not tested)  LOWER EXTREMITY MMT:  MMT Right eval Left eval  Hip flexion 4/5 4/5  Hip extension    Hip abduction 3+/5 4/5  Hip adduction    Hip internal rotation    Hip external rotation    Knee flexion    Knee extension    Ankle dorsiflexion    Ankle plantarflexion    Ankle inversion    Ankle eversion     (Blank rows = not tested)    FUNCTIONAL TESTS:  5 times sit to stand: 19.95 sec with hands on knees (>15 sec indicates high risk for falls) 10 meter walk test: 10.65 sec (0.98 m/s), limited community ambulator  SLS: R: 3-5 sec  L: 5-8 sec  GAIT: Pt ambulated on level surface without AD, reciprocal gait pattern, slightly slower gait speed, no foot drag observed this session;     TODAY'S TREATMENT: 01/29/2022   TREATMENT:  Ice cup massage x 8 minutes Gaol performance see below;  Trigger Point Dry Needling (TDN), unbilled Education performed with patient regarding potential benefit of TDN. Reviewed precautions and risks with patient. Reviewed special precautions/risks over lung fields which include pneumothorax. Reviewed signs and symptoms of pneumothorax and advised pt to go to ER immediately if these symptoms develop advise them of dry needling treatment. Extensive time spent with pt to ensure full understanding of TDN risks. Pt provided verbal consent to treatment. TDN performed to  with 0.3 x 60 single needle placements with local twitch response (LTR). Pistoning technique utilized. Improved pain-free motion following intervention. R hamstring and calf x 2 minutes    PATIENT EDUCATION:  Education details: exercise technique, body mechanics Person educated: Patient Education method: Explanation, Demonstration, and Verbal cues Education comprehension: verbalized understanding, returned demonstration, verbal cues required, and needs further education   HOME EXERCISE PROGRAM:  No updates on  01/21/2022 Pt to continue HEP as previously given Continue as given from home health PT Reinforced importance of ROM including quad sets and LAQ/knee flexion;   ASSESSMENT:  CLINICAL IMPRESSION:  Patient demonstrates excellent progression towards goals, meeting many of them. Her ROM is [facility] however her stability is an area of focus. Her gait speed has returned to PLOF. Stair negotiation continues to be an area of progress as well as prolonged ambulation as can be seen in FOTO scoring.  Patient's condition has the potential to improve in response to therapy. Maximum improvement is yet to be obtained. The anticipated improvement is attainable and reasonable in a generally predictable time.Pt to continue with current HEP and skilled therapy to address remaining deficits.       OBJECTIVE IMPAIRMENTS Abnormal gait, cardiopulmonary status limiting activity, decreased activity tolerance, decreased balance, decreased endurance, decreased mobility, difficulty walking, decreased ROM, decreased strength, hypomobility, impaired flexibility, and pain.   ACTIVITY LIMITATIONS bending, standing, squatting, stairs, transfers, and locomotion level  PARTICIPATION LIMITATIONS: cleaning, laundry, shopping, community activity, occupation, yard work, and church  PERSONAL FACTORS Age, Fitness, and 3+ comorbidities: MS, Sleep apnea, 2014 brain tumor, history of LE cellulitis, intermittent fluid build up/swelling in LE, DJD, HTN, Thrombocytopenia;   are also affecting patient's functional outcome.   REHAB POTENTIAL: Good  CLINICAL DECISION MAKING: Stable/uncomplicated  EVALUATION COMPLEXITY: Low   GOALS: Goals reviewed with patient? Yes  SHORT TERM GOALS: Target date: 01/23/2022  Patient will be adherent to HEP at least 3x a week to improve functional strength and balance for better safety at home. Baseline: Doing some exercise from home health 8/22: yes Goal status: MET  2.  Patient will improve RLE knee  extension to 0 to improve knee ROM for functional tasks such as standing/walking.  Baseline: RLE: -5 degrees in supine 8/22: neutral  Goal status: MET   LONG TERM GOALS: Target date: 02/20/2022   Patient will increase 10 meter walk test to >1.60m/s as to improve gait speed for better community ambulation and to reduce fall risk. Baseline: 0.98 m/s 8/22: 7.72 seconds =1.29 m/s  Goal status: MET  2.  Patient will tolerate 5 seconds of single leg stance without loss of balance to improve ability to get in and out of shower safely. Baseline: RLE: 3-5 sec inconsistent 8/22: 5 seconds Goal status: MET  3.   Patient will be independent  with ascend/descend 12 steps using single UE in step over step pattern without LOB to safely get up/down stairs in home. Baseline: Not formally assessed, negotiates one step at a time; limits steps when possible 8/22: reciprocal ascending; step to descending Goal status: Partially Met  4.  Pt will improve RLE knee flexion to >115 degrees for functional ROM for transfers and step negotiation;  Baseline: RLE: 108 degrees 8/22: 125  Goal status: MET  5.  Patient (> 58 years old) will complete five times sit to stand test in < 15 seconds indicating an increased LE strength and improved balance. Baseline: 19.95 sec 8/22: 15.65 seconds Goal status: Partially Met  6.  Patient will improve FOTO score by 5% to indicate improved functional mobility with ADLs.  Baseline: 46% 8/22: 55%  Goal status: Partially Met    PLAN: PT FREQUENCY: 2x/week  PT DURATION: 8 weeks  PLANNED INTERVENTIONS: Therapeutic exercises, Therapeutic activity, Neuromuscular re-education, Balance training, Gait training, Patient/Family education, Self Care, Joint mobilization, Stair training, Dry Needling, Cryotherapy, Moist heat, scar mobilization, Taping, and Manual therapy  PLAN FOR NEXT SESSION: work on ROM/strengthening, dry needling, continue plan   Janna Arch PT   01/29/22, 10:23  AM

## 2022-01-29 ENCOUNTER — Ambulatory Visit: Payer: Medicare Other

## 2022-01-29 DIAGNOSIS — M25661 Stiffness of right knee, not elsewhere classified: Secondary | ICD-10-CM

## 2022-01-29 DIAGNOSIS — M25561 Pain in right knee: Secondary | ICD-10-CM

## 2022-01-30 NOTE — Therapy (Signed)
OUTPATIENT PHYSICAL THERAPY LOWER EXTREMITY Treatment   Patient Name: Leslie Smith MRN: [id number] DOB:[birth year], 71 y.o., female Today's Date: 01/31/2022   PT End of Session - 01/31/22 0809     Visit Number 11    Number of Visits 17    Date for PT Re-Evaluation 02/20/22    Authorization Type Medicare; 8/22: 1/10 PN    PT Start Time 0807    PT Stop Time 0842    PT Time Calculation (min) 35 min    Equipment Utilized During Treatment --    Activity Tolerance Patient tolerated treatment well;Patient limited by pain    Behavior During Therapy [facility] for tasks assessed/performed                   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 ovaries had grown attached to her back bone   BRAIN SURGERY  2015   tumor resection-denies seizures   BREAST SURGERY Left 2014   biopsy. negative   HARDWARE REMOVAL Left 05/05/2018   Procedure: HARDWARE REMOVAL- LEFT ANKLE;  Surgeon: Kennedy Bucker, MD;  Location: [facility];  Service: Orthopedics;  Laterality: Left;   I & D EXTREMITY Left 08/25/2015   Procedure: IRRIGATION AND DEBRIDEMENT THUMB;  Surgeon: Bradly Bienenstock, MD;  Location: [facility];  Service: Orthopedics;  Laterality: Left;   ORIF ANKLE FRACTURE Left 02/20/2018   Procedure: OPEN REDUCTION INTERNAL FIXATION (ORIF) ANKLE FRACTURE;  Surgeon: Kennedy Bucker,  MD;  Location: [facility];  Service: Orthopedics;  Laterality: Left;   TOTAL KNEE ARTHROPLASTY Right 11/21/2021   Procedure: TOTAL KNEE ARTHROPLASTY;  Surgeon: Lyndle Herrlich, MD;  Location: [facility];  Service: Orthopedics;  Laterality: Right;   Patient Active Problem List   Diagnosis Date Noted   S/P TKR (total knee replacement) using cement, right 11/21/2021   Atherosclerosis of abdominal aorta ([facility]) 01/10/2021   Coronary artery disease involving native coronary artery of native heart 01/10/2021   OSA (obstructive sleep apnea) 04/19/2020   Statin intolerance 01/14/2020   S/P ORIF (open reduction internal fixation) fracture 02/20/2018   DJD (degenerative joint disease) of knee 01/02/2017   Advanced care planning/counseling discussion 01/02/2017   Thrombocytopenia ([facility]) 10/30/2015   Multiple sclerosis ([facility])    Essential hypertension    Hyperlipidemia    Benign neoplasm of brain ([facility]) 04/30/2013   Cerebral meningioma ([facility]) 04/29/2013   Malignant hypertension 04/29/2013   Obesity 04/15/2013    PCP: Aram Beecham, MD  REFERRING PROVIDER: Cassell Smiles MD  REFERRING DIAG: R TKA, 11/21/21  THERAPY DIAG:  Stiffness of right knee, not elsewhere classified  Chronic pain of right knee  Muscle weakness (generalized)  Rationale for Evaluation and Treatment Rehabilitation  ONSET DATE: 11/21/21  SUBJECTIVE:  SUBJECTIVE STATEMENT: Pt reports pain level is 7-8/10. Pt not feeling well, had an accident with her neighbor's dog she loves and golf-cart. She picked up the golf cart to get it off the dog and has been sore ever since. Reports dog is OK but this was a very upsetting experience.  She reports she is hurting all over "from head to toe." Took pain medication prior to session today.  PERTINENT HISTORY: 71 yo Female s/p R TKA on 11/21/21. Patient reports having home health PT for 2-3 weeks. She reports doing well. She presents to therapy without AD. She reports not needing AD at home.  She has been sleeping with CPAP and cryocuff on RLE- but has been sleeping well; PMH significant: MS, Sleep apnea, 2014 brain tumor, history of LE cellulitis, intermittent fluid build up/swelling in LE, DJD, HTN, Thrombocytopenia;  Pt is following up with PCP regarding LE swelling;  Pt did have elevated BP during last bout of therapy- She has not been checking it but reports the home health PT stated it was elevated at times;   PAIN:  Are you having pain? Yes: NPRS scale: 7-8/10 Pain location: right calf Pain description: dull Aggravating factors: worse in the evening/swelling;  Relieving factors: pain medicine- has limited some; ice;   PRECAUTIONS: Fall Risk  WEIGHT BEARING RESTRICTIONS No  FALLS:  Has patient fallen in last 6 months? Yes. Number of falls 1  LIVING ENVIRONMENT: Lives with: lives alone Lives in: House/apartment Stairs: Yes: Internal: 13 steps; on right going up and External: 3 steps; has ramp Has following equipment at home: Single point cane, Walker - 2 wheeled, Environmental consultant - 4 wheeled, Crutches, bed side commode, and Grab bars  OCCUPATION: Retired Emergency planning/management officer  PLOF: Independent with basic ADLs, Independent with gait, and Independent with transfers  Had PT for MS/imbalance from: 09/27/20-08/02/21 Prior Outcome measures:  DGI: 06/13/21: 19/24 5 times sit<>Stand : 04/11/22: 14.5 sec Timed up and go: 06/13/21: 8.12 sec 6 min walk: 07/31/21: 1100 feet with elevated BP FGA: 06/13/21: 17/30   PATIENT GOALS Improve transfers, increase endurance/activity tolerance, Improve strength Pt would like to be able to clean her house and be more active at home.    OBJECTIVE:  Objective data taken at eval unless otherwise specified   DIAGNOSTIC FINDINGS: Xray- 11/21/21: knee looked stable  PATIENT SURVEYS:  FOTO 46%  COGNITION:  Overall cognitive status: Within functional limits for tasks assessed     SENSATION: WFL  EDEMA:  Not formally assessed;   POSTURE: No Significant  postural limitations  PALPATION: Reports moderate tenderness along right medial/posterior knee   LOWER EXTREMITY ROM:  Active ROM Right eval Left eval                          Knee flexion 108 114  Knee extension -5 -2                   (Blank rows = not tested)  LOWER EXTREMITY MMT:  MMT Right eval Left eval  Hip flexion 4/5 4/5  Hip extension    Hip abduction 3+/5 4/5  Hip adduction    Hip internal rotation    Hip external rotation    Knee flexion    Knee extension    Ankle dorsiflexion    Ankle plantarflexion    Ankle inversion    Ankle eversion     (Blank rows = not tested)    FUNCTIONAL TESTS:  5  times sit to stand: 19.95 sec with hands on knees (>15 sec indicates high risk for falls) 10 meter walk test: 10.65 sec (0.98 m/s), limited community ambulator  SLS: R: 3-5 sec  L: 5-8 sec  GAIT: Pt ambulated on level surface without AD, reciprocal gait pattern, slightly slower gait speed, no foot drag observed this session;     TODAY'S TREATMENT: 01/31/2022   TREATMENT:  Heat donned to RLE and low back. Pt reports nothing on low back, LE that would prohibit use of heat.  Manual:    IASTM applied to R gastroc soleus complex and R hamstrings for increased tissue extensibility and reduction in pain, while pt in prone.  Addition of TrP release as pt continues to demonstrate extensive TP's in these regions x several minutes. PT did instruct pt to follow-up with her physician regarding "knots" felt below skin if pain does not improve.    TherEx:   Nustep endurance training  Lvl 0 x 1 min Lvl 2 x 1 min Lvl 4 x 1 min Lvl 5 x 1 min Lvl 6 x 1 min. Challenging Lvl 3 x 1 min Cuing for SPM >70, unable to achieve levels 5-6 (maintains in upper 50s).   Standing hip abduction 2x10 each LE Standing hip ext. 2x10 each LE Standing march 2x10 each LE Mini squats at support surface 12x   Pt reports pain level 5/10 at end of sessoin    PATIENT  EDUCATION:  Education details: exercise technique, body mechanics Person educated: Patient Education method: Explanation, Demonstration, and Verbal cues Education comprehension: verbalized understanding, returned demonstration, verbal cues required, and needs further education   HOME EXERCISE PROGRAM:  No updates on 01/31/2022 Pt to continue HEP as previously given Continue as given from home health PT Reinforced importance of ROM including quad sets and LAQ/knee flexion;   ASSESSMENT:  CLINICAL IMPRESSION:  Greater focus on pain-modulation for RLE as pt presents with increased pain today. Following IASTM and TrP release pt able to perform strengthening and endurance interventions without increased pain. She reported 5/10 pain at end of session (reduced from 7-8/10). Pt to continue with current HEP and skilled therapy to address remaining deficits in order to improve pain, balance, endurance, strength and mobility.      OBJECTIVE IMPAIRMENTS Abnormal gait, cardiopulmonary status limiting activity, decreased activity tolerance, decreased balance, decreased endurance, decreased mobility, difficulty walking, decreased ROM, decreased strength, hypomobility, impaired flexibility, and pain.   ACTIVITY LIMITATIONS bending, standing, squatting, stairs, transfers, and locomotion level  PARTICIPATION LIMITATIONS: cleaning, laundry, shopping, community activity, occupation, yard work, and church  PERSONAL FACTORS Age, Fitness, and 3+ comorbidities: MS, Sleep apnea, 2014 brain tumor, history of LE cellulitis, intermittent fluid build up/swelling in LE, DJD, HTN, Thrombocytopenia;   are also affecting patient's functional outcome.   REHAB POTENTIAL: Good  CLINICAL DECISION MAKING: Stable/uncomplicated  EVALUATION COMPLEXITY: Low   GOALS: Goals reviewed with patient? Yes  SHORT TERM GOALS: Target date: 01/23/2022  Patient will be adherent to HEP at least 3x a week to improve functional  strength and balance for better safety at home. Baseline: Doing some exercise from home health 8/22: yes Goal status: MET  2.  Patient will improve RLE knee extension to 0 to improve knee ROM for functional tasks such as standing/walking.  Baseline: RLE: -5 degrees in supine 8/22: neutral  Goal status: MET   LONG TERM GOALS: Target date: 02/20/2022   Patient will increase 10 meter walk test to >1.47m/s as to improve gait speed  for better community ambulation and to reduce fall risk. Baseline: 0.98 m/s 8/22: 7.72 seconds =1.29 m/s  Goal status: MET  2.  Patient will tolerate 5 seconds of single leg stance without loss of balance to improve ability to get in and out of shower safely. Baseline: RLE: 3-5 sec inconsistent 8/22: 5 seconds Goal status: MET  3.   Patient will be independent with ascend/descend 12 steps using single UE in step over step pattern without LOB to safely get up/down stairs in home. Baseline: Not formally assessed, negotiates one step at a time; limits steps when possible 8/22: reciprocal ascending; step to descending Goal status: Partially Met  4.  Pt will improve RLE knee flexion to >115 degrees for functional ROM for transfers and step negotiation;  Baseline: RLE: 108 degrees 8/22: 125  Goal status: MET  5.  Patient (> 16 years old) will complete five times sit to stand test in < 15 seconds indicating an increased LE strength and improved balance. Baseline: 19.95 sec 8/22: 15.65 seconds Goal status: Partially Met  6.  Patient will improve FOTO score by 5% to indicate improved functional mobility with ADLs.  Baseline: 46% 8/22: 55%  Goal status: Partially Met    PLAN: PT FREQUENCY: 2x/week  PT DURATION: 8 weeks  PLANNED INTERVENTIONS: Therapeutic exercises, Therapeutic activity, Neuromuscular re-education, Balance training, Gait training, Patient/Family education, Self Care, Joint mobilization, Stair training, Dry Needling, Cryotherapy, Moist heat, scar  mobilization, Taping, and Manual therapy  PLAN FOR NEXT SESSION: work on ROM/strengthening, dry needling, continue plan   Zollie Pee PT   01/31/22, 9:59 AM

## 2022-01-31 ENCOUNTER — Ambulatory Visit: Payer: Medicare Other

## 2022-01-31 DIAGNOSIS — M25561 Pain in right knee: Secondary | ICD-10-CM

## 2022-01-31 DIAGNOSIS — M25661 Stiffness of right knee, not elsewhere classified: Secondary | ICD-10-CM

## 2022-01-31 DIAGNOSIS — M6281 Muscle weakness (generalized): Secondary | ICD-10-CM

## 2022-02-04 ENCOUNTER — Other Ambulatory Visit: Payer: Self-pay | Admitting: Internal Medicine

## 2022-02-04 DIAGNOSIS — M79661 Pain in right lower leg: Secondary | ICD-10-CM

## 2022-02-05 ENCOUNTER — Ambulatory Visit: Payer: Medicare Other

## 2022-02-05 ENCOUNTER — Ambulatory Visit
Admission: RE | Admit: 2022-02-05 | Discharge: 2022-02-05 | Disposition: A | Discharge status: Home / Self Care | Payer: Medicare Other | Source: Ambulatory Visit | Attending: Internal Medicine | Admitting: Internal Medicine

## 2022-02-05 DIAGNOSIS — M25561 Pain in right knee: Secondary | ICD-10-CM | POA: Diagnosis not present

## 2022-02-05 DIAGNOSIS — R262 Difficulty in walking, not elsewhere classified: Secondary | ICD-10-CM

## 2022-02-05 DIAGNOSIS — R2681 Unsteadiness on feet: Secondary | ICD-10-CM

## 2022-02-05 DIAGNOSIS — M6281 Muscle weakness (generalized): Secondary | ICD-10-CM

## 2022-02-05 DIAGNOSIS — M25661 Stiffness of right knee, not elsewhere classified: Secondary | ICD-10-CM

## 2022-02-05 DIAGNOSIS — R2689 Other abnormalities of gait and mobility: Secondary | ICD-10-CM

## 2022-02-05 DIAGNOSIS — R278 Other lack of coordination: Secondary | ICD-10-CM

## 2022-02-05 DIAGNOSIS — M79661 Pain in right lower leg: Secondary | ICD-10-CM

## 2022-02-05 NOTE — Therapy (Signed)
OUTPATIENT PHYSICAL THERAPY LOWER EXTREMITY Treatment   Patient Name: Leslie Smith MRN: [id number] DOB:04-25-1951, 71 y.o., female Today's Date: 02/05/2022   PT End of Session - 02/05/22 1036     Visit Number 12    Number of Visits 17    Date for PT Re-Evaluation 02/20/22    Authorization Type Medicare Parts A & B    Authorization Time Period 12/25/21-02/20/22    Progress Note Due on Visit 74    PT Start Time 1022    PT Stop Time 1100    PT Time Calculation (min) 38 min    Activity Tolerance Patient tolerated treatment well;Patient limited by pain    Behavior During Therapy [facility] for tasks assessed/performed                   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[place]) 09/15/2014   Bladder incontinence    Cancer ([place]) 2014   brain tumor, resected   Cellulitis and abscess of leg 2019   2 episodes this year   Complication of anesthesia    DJD (degenerative joint disease) of knee 2018   Fall    Fatigue    History of blood transfusion    Hypertension    MS (multiple sclerosis) ([place])    Myelitis ([place])    Neuromuscular disorder ([place]) 2001   multiple sclerosis diagnosed by MRI   PONV (postoperative nausea and vomiting)    severe   Thrombocytopenia ([place])    Past Surgical History:  Procedure Laterality Date   ABDOMINAL HYSTERECTOMY  2011   required wound vac for 6 weeks. ovaries had grown attached to her back bone   BRAIN SURGERY  2015   tumor resection-denies seizures   BREAST SURGERY Left 2014   biopsy. negative   HARDWARE REMOVAL Left 05/05/2018   Procedure: HARDWARE REMOVAL- LEFT ANKLE;  Surgeon: Hessie Knows, MD;  Location: [facility];  Service: Orthopedics;  Laterality: Left;   I & D EXTREMITY Left 08/25/2015   Procedure: IRRIGATION AND DEBRIDEMENT THUMB;  Surgeon: Iran Planas, MD;  Location: [place];  Service: Orthopedics;  Laterality: Left;   ORIF ANKLE FRACTURE Left 02/20/2018   Procedure: OPEN REDUCTION INTERNAL FIXATION (ORIF) ANKLE  FRACTURE;  Surgeon: Hessie Knows, MD;  Location: [facility];  Service: Orthopedics;  Laterality: Left;   TOTAL KNEE ARTHROPLASTY Right 11/21/2021   Procedure: TOTAL KNEE ARTHROPLASTY;  Surgeon: Lovell Sheehan, MD;  Location: [facility];  Service: Orthopedics;  Laterality: Right;   Patient Active Problem List   Diagnosis Date Noted   S/P TKR (total knee replacement) using cement, right 11/21/2021   Atherosclerosis of abdominal aorta ([place]) 01/10/2021   Coronary artery disease involving native coronary artery of native heart 01/10/2021   OSA (obstructive sleep apnea) 04/19/2020   Statin intolerance 01/14/2020   S/P ORIF (open reduction internal fixation) fracture 02/20/2018   DJD (degenerative joint disease) of knee 01/02/2017   Advanced care planning/counseling discussion 01/02/2017   Thrombocytopenia ([place]) 10/30/2015   Multiple sclerosis ([place])    Essential hypertension    Hyperlipidemia    Benign neoplasm of brain ([place]) 04/30/2013   Cerebral meningioma ([place]) 04/29/2013   Malignant hypertension 04/29/2013   Obesity 04/15/2013    PCP: Fulton Reek, MD  REFERRING PROVIDER: Kurtis Bushman MD  REFERRING DIAG: R TKA, 11/21/21  THERAPY DIAG:  Stiffness of right knee, not elsewhere classified  Chronic pain of right knee  Muscle weakness (generalized)  Difficulty in walking, not  elsewhere classified  Unsteadiness on feet  Other abnormalities of gait and mobility  Other lack of coordination  Rationale for Evaluation and Treatment Rehabilitation  ONSET DATE: 11/21/21  SUBJECTIVE:   SUBJECTIVE STATEMENT: Pt doing good today. Went to [facility] imaging this morning for MSUS of calf, no word yet on results. Her knee is feeling pretty stinking good. Her calf is improving but still sore.   PERTINENT HISTORY: 71 yo Female s/p R TKA on 11/21/21. Patient reports having home health PT for 2-3 weeks. She reports doing well. She presents to therapy without AD. She reports not needing AD at  home. She has been sleeping with CPAP and cryocuff on RLE- but has been sleeping well; PMH significant: MS, Sleep apnea, 2014 brain tumor, history of LE cellulitis, intermittent fluid build up/swelling in LE, DJD, HTN, Thrombocytopenia; Pt is followed by PCP regarding LE swelling; Pt  back on BP medications.   Are you having pain? Yes: NPRS scale: 3/10 Pain location: right calf Pain description: dull Aggravating factors: worse in the evening/swelling;  Relieving factors: pain medicine- has limited some; ice;   PRECAUTIONS: Fall Risk  WEIGHT BEARING RESTRICTIONS No  FALLS:  Has patient fallen in last 6 months? Yes. Number of falls 1  LIVING ENVIRONMENT: Lives with: lives alone Lives in: House/apartment Stairs: Yes: Internal: 13 steps; on right going up and External: 3 steps; has ramp Has following equipment at home: Single point cane, Walker - 2 wheeled, Environmental consultant - 4 wheeled, Crutches, bed side commode, and Grab bars  OCCUPATION: Retired Emergency planning/management officer  PLOF: Independent with basic ADLs, Independent with gait, and Independent with transfers   [place] transfers, increase endurance/activity tolerance, Improve strength Pt would like to be able to clean her house and be more active at home.     TODAY'S TREATMENT: 02/05/2022   TherEx:  -AA/ROM on N[place]: seat 9, arms: 2x 2.5 min level 1, 2x2.5 on  -mini squat x15 c BUE support on // bar -c 2lb AW retro AMB in // bars 6 lengths, then side stepping 8 lengths bilat  -standing marching 1x20 c bilat ankle weights 2lb, hamstrings curls 1x10 bilat 2lb AW  -STS from chair + airex (pt wearing 2.5 inch heels) 2x10 hands free  -SLS 15x 5sec bilat in // bars     PATIENT EDUCATION:  Education details: exercise technique, activity modification with HEP  Person educated: Patient Education method: Consulting civil engineer, Demonstration, and Verbal cues Education comprehension: verbalized understanding, returned demonstration, verbal cues  required, and needs further education   HOME EXERCISE PROGRAM:  No updates on 01/31/2022 Pt to continue HEP as previously given Continue as given from home health PT Reinforced importance of ROM including quad sets and LAQ/knee flexion;   ASSESSMENT:  CLINICAL IMPRESSION: Pt doing well, now back on her BP medication that was DC in the postoperative phase. Exercises continue to be advanced with good tolerance overall. Pt waiting to hear back from her doctor regarding calf imaging today. Pt to continue with current HEP and skilled therapy to address remaining deficits in order to improve pain, balance, endurance, strength and mobility.      OBJECTIVE IMPAIRMENTS Abnormal gait, cardiopulmonary status limiting activity, decreased activity tolerance, decreased balance, decreased endurance, decreased mobility, difficulty walking, decreased ROM, decreased strength, hypomobility, impaired flexibility, and pain.   ACTIVITY LIMITATIONS bending, standing, squatting, stairs, transfers, and locomotion level  PARTICIPATION LIMITATIONS: cleaning, laundry, shopping, community activity, occupation, yard work, and church  PERSONAL FACTORS Age, Fitness, and 3+ comorbidities:  MS, Sleep apnea, 2014 brain tumor, history of LE cellulitis, intermittent fluid build up/swelling in LE, DJD, HTN, Thrombocytopenia;   are also affecting patient's functional outcome.   REHAB POTENTIAL: Good  CLINICAL DECISION MAKING: Stable/uncomplicated  EVALUATION COMPLEXITY: Low   GOALS: Goals reviewed with patient? Yes  SHORT TERM GOALS: Target date: 01/23/2022  Patient will be adherent to HEP at least 3x a week to improve functional strength and balance for better safety at home. Baseline: Doing some exercise from home health 8/22: yes Goal status: MET  2.  Patient will improve RLE knee extension to 0 to improve knee ROM for functional tasks such as standing/walking.  Baseline: RLE: -5 degrees in supine 8/22: neutral   Goal status: MET   LONG TERM GOALS: Target date: 02/20/2022   Patient will increase 10 meter walk test to >1.72ms as to improve gait speed for better community ambulation and to reduce fall risk. Baseline: 0.98 m/s 8/22: 7.72 seconds =1.29 m/s  Goal status: MET  2.  Patient will tolerate 5 seconds of single leg stance without loss of balance to improve ability to get in and out of shower safely. Baseline: RLE: 3-5 sec inconsistent 8/22: 5 seconds Goal status: MET  3.   Patient will be independent with ascend/descend 12 steps using single UE in step over step pattern without LOB to safely get up/down stairs in home. Baseline: Not formally assessed, negotiates one step at a time; limits steps when possible 8/22: reciprocal ascending; step to descending Goal status: Partially Met  4.  Pt will improve RLE knee flexion to >115 degrees for functional ROM for transfers and step negotiation;  Baseline: RLE: 108 degrees 8/22: 125  Goal status: MET  5.  Patient (> 662years old) will complete five times sit to stand test in < 15 seconds indicating an increased LE strength and improved balance. Baseline: 19.95 sec 8/22: 15.65 seconds Goal status: Partially Met  6.  Patient will improve FOTO score by 5% to indicate improved functional mobility with ADLs.  Baseline: 46% 8/22: 55%  Goal status: Partially Met    PLAN: PT FREQUENCY: 2x/week  PT DURATION: 8 weeks  PLANNED INTERVENTIONS: Therapeutic exercises, Therapeutic activity, Neuromuscular re-education, Balance training, Gait training, Patient/Family education, Self Care, Joint mobilization, Stair training, Dry Needling, Cryotherapy, Moist heat, scar mobilization, Taping, and Manual therapy  PLAN FOR NEXT SESSION: work on ROM/strengthening, dry needling, continue plan   10:44 AM, 02/05/22 AEtta Grandchild PT, DPT Physical Therapist - [phone]     AEtta GrandchildPT   02/05/22, 10:44 AM

## 2022-02-07 ENCOUNTER — Ambulatory Visit: Payer: Medicare Other

## 2022-02-08 NOTE — Therapy (Signed)
OUTPATIENT PHYSICAL THERAPY LOWER EXTREMITY Treatment   Patient Name: Leslie Smith MRN: [id number] DOB:[birth year], 71 y.o., female Today's Date: 02/12/2022   PT End of Session - 02/12/22 0854     Visit Number 13    Number of Visits 17    Date for PT Re-Evaluation 02/20/22    Authorization Type Medicare Parts A & B    Authorization Time Period 12/25/21-02/20/22    Progress Note Due on Visit 22    PT Start Time 0850    PT Stop Time 0930    PT Time Calculation (min) 40 min    Activity Tolerance Patient tolerated treatment well;Patient limited by pain    Behavior During Therapy [facility] for tasks assessed/performed                    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[place]) 09/15/2014   Bladder incontinence    Cancer ([place]) 2014   brain tumor, resected   Cellulitis and abscess of leg 2019   2 episodes this year   Complication of anesthesia    DJD (degenerative joint disease) of knee 2018   Fall    Fatigue    History of blood transfusion    Hypertension    MS (multiple sclerosis) ([place])    Myelitis ([place])    Neuromuscular disorder ([place]) 2001   multiple sclerosis diagnosed by MRI   PONV (postoperative nausea and vomiting)    severe   Thrombocytopenia ([place])    Past Surgical History:  Procedure Laterality Date   ABDOMINAL HYSTERECTOMY  2011   required wound vac for 6 weeks. ovaries had grown attached to her back bone   BRAIN SURGERY  2015   tumor resection-denies seizures   BREAST SURGERY Left 2014   biopsy. negative   HARDWARE REMOVAL Left 05/05/2018   Procedure: HARDWARE REMOVAL- LEFT ANKLE;  Surgeon: Hessie Knows, MD;  Location: [facility];  Service: Orthopedics;  Laterality: Left;   I & D EXTREMITY Left 08/25/2015   Procedure: IRRIGATION AND DEBRIDEMENT THUMB;  Surgeon: Iran Planas, MD;  Location: [place];  Service: Orthopedics;  Laterality: Left;   ORIF ANKLE FRACTURE Left 02/20/2018   Procedure: OPEN REDUCTION INTERNAL FIXATION (ORIF) ANKLE  FRACTURE;  Surgeon: Hessie Knows, MD;  Location: [facility];  Service: Orthopedics;  Laterality: Left;   TOTAL KNEE ARTHROPLASTY Right 11/21/2021   Procedure: TOTAL KNEE ARTHROPLASTY;  Surgeon: Lovell Sheehan, MD;  Location: [facility];  Service: Orthopedics;  Laterality: Right;   Patient Active Problem List   Diagnosis Date Noted   S/P TKR (total knee replacement) using cement, right 11/21/2021   Atherosclerosis of abdominal aorta ([place]) 01/10/2021   Coronary artery disease involving native coronary artery of native heart 01/10/2021   OSA (obstructive sleep apnea) 04/19/2020   Statin intolerance 01/14/2020   S/P ORIF (open reduction internal fixation) fracture 02/20/2018   DJD (degenerative joint disease) of knee 01/02/2017   Advanced care planning/counseling discussion 01/02/2017   Thrombocytopenia ([place]) 10/30/2015   Multiple sclerosis ([place])    Essential hypertension    Hyperlipidemia    Benign neoplasm of brain ([place]) 04/30/2013   Cerebral meningioma ([place]) 04/29/2013   Malignant hypertension 04/29/2013   Obesity 04/15/2013    PCP: Fulton Reek, MD  REFERRING PROVIDER: Kurtis Bushman MD  REFERRING DIAG: R TKA, 11/21/21  THERAPY DIAG:  Chronic pain of right knee  Muscle weakness (generalized)  Difficulty in walking, not elsewhere classified  Rationale for Evaluation and  Treatment Rehabilitation  ONSET DATE: 11/21/21  SUBJECTIVE:   SUBJECTIVE STATEMENT: Pt reports top of R foot has been hurting for the past three days. Thinks could be MS pain.  Pt reports no falls, but L foot/toes have been caught on things so she has had some stumbles.   Are you having pain? Yes: NPRS scale: 3/10 Pain location: right foot Pain description: dull Aggravating factors: worse in the evening/swelling;  Relieving factors: pain medicine- has limited some; ice;    PERTINENT HISTORY: 71 yo Female s/p R TKA on 11/21/21. Patient reports having home health PT for 2-3 weeks. She reports doing well.  She presents to therapy without AD. She reports not needing AD at home. She has been sleeping with CPAP and cryocuff on RLE- but has been sleeping well; PMH significant: MS, Sleep apnea, 2014 brain tumor, history of LE cellulitis, intermittent fluid build up/swelling in LE, DJD, HTN, Thrombocytopenia; Pt is followed by PCP regarding LE swelling; Pt  back on BP medications.   Are you having pain? Yes: NPRS scale: 3/10 Pain location: right calf Pain description: dull Aggravating factors: worse in the evening/swelling;  Relieving factors: pain medicine- has limited some; ice;   PRECAUTIONS: Fall Risk  WEIGHT BEARING RESTRICTIONS No  FALLS:  Has patient fallen in last 6 months? Yes. Number of falls 1  LIVING ENVIRONMENT: Lives with: lives alone Lives in: House/apartment Stairs: Yes: Internal: 13 steps; on right going up and External: 3 steps; has ramp Has following equipment at home: Single point cane, Walker - 2 wheeled, Environmental consultant - 4 wheeled, Crutches, bed side commode, and Grab bars  OCCUPATION: Retired Emergency planning/management officer  PLOF: Independent with basic ADLs, Independent with gait, and Independent with transfers   [place] transfers, increase endurance/activity tolerance, Improve strength Pt would like to be able to clean her house and be more active at home.     TODAY'S TREATMENT: 02/12/2022    Manual: IASTM to R gastrocsol and distal medial hamstring with addition of TrP release x several minutes, pt in prone.  Two primary TrP in each region reported by pt.    TherEx: -SLR 2x15. Pt rates medium. -Bridges 8x. Pain limited due to R calf -AAROM gastrocsol stretch 2x30-45 sec  - R ankle pump 20x  STS 10x. Rates medium. No pain with intervention  Standing heel raises 15x2 sets. Reports no pain Standing hip abduction 2x15. Reports no pain. Standing hip ext 10x each LE. Reports no pain.      PATIENT EDUCATION:  Education details: Pt educated throughout session about  proper posture and technique with exercises. Improved exercise technique, movement at target joints, use of target muscles after min to mod verbal, visual, tactile cues.  Person educated: Patient Education method: Explanation, Demonstration, and Verbal cues Education comprehension: verbalized understanding, returned demonstration, verbal cues required, and needs further education   HOME EXERCISE PROGRAM:  No updates today. Pt to continue HEP as previously given: Continue as given from home health PT Reinforced importance of ROM including quad sets and LAQ/knee flexion;   ASSESSMENT:  CLINICAL IMPRESSION: Initial interventions regressed as pt with increased R calf pain today. Recent US per chart indicated no DVT. Following manual therapy pt exhibited improved exercise tolerance without calf pain. Pt would benefit from further skilled therapy to address remaining deficits in order to improve pain, balance, endurance, strength and mobility.      OBJECTIVE IMPAIRMENTS Abnormal gait, cardiopulmonary status limiting activity, decreased activity tolerance, decreased balance, decreased endurance, decreased mobility,  difficulty walking, decreased ROM, decreased strength, hypomobility, impaired flexibility, and pain.   ACTIVITY LIMITATIONS bending, standing, squatting, stairs, transfers, and locomotion level  PARTICIPATION LIMITATIONS: cleaning, laundry, shopping, community activity, occupation, yard work, and church  PERSONAL FACTORS Age, Fitness, and 3+ comorbidities: MS, Sleep apnea, 2014 brain tumor, history of LE cellulitis, intermittent fluid build up/swelling in LE, DJD, HTN, Thrombocytopenia;   are also affecting patient's functional outcome.   REHAB POTENTIAL: Good  CLINICAL DECISION MAKING: Stable/uncomplicated  EVALUATION COMPLEXITY: Low   GOALS: Goals reviewed with patient? Yes  SHORT TERM GOALS: Target date: 01/23/2022  Patient will be adherent to HEP at least 3x a week to  improve functional strength and balance for better safety at home. Baseline: Doing some exercise from home health 8/22: yes Goal status: MET  2.  Patient will improve RLE knee extension to 0 to improve knee ROM for functional tasks such as standing/walking.  Baseline: RLE: -5 degrees in supine 8/22: neutral  Goal status: MET   LONG TERM GOALS: Target date: 02/20/2022   Patient will increase 10 meter walk test to >1.61m/s as to improve gait speed for better community ambulation and to reduce fall risk. Baseline: 0.98 m/s 8/22: 7.72 seconds =1.29 m/s  Goal status: MET  2.  Patient will tolerate 5 seconds of single leg stance without loss of balance to improve ability to get in and out of shower safely. Baseline: RLE: 3-5 sec inconsistent 8/22: 5 seconds Goal status: MET  3.   Patient will be independent with ascend/descend 12 steps using single UE in step over step pattern without LOB to safely get up/down stairs in home. Baseline: Not formally assessed, negotiates one step at a time; limits steps when possible 8/22: reciprocal ascending; step to descending Goal status: Partially Met  4.  Pt will improve RLE knee flexion to >115 degrees for functional ROM for transfers and step negotiation;  Baseline: RLE: 108 degrees 8/22: 125  Goal status: MET  5.  Patient (> 2 years old) will complete five times sit to stand test in < 15 seconds indicating an increased LE strength and improved balance. Baseline: 19.95 sec 8/22: 15.65 seconds Goal status: Partially Met  6.  Patient will improve FOTO score by 5% to indicate improved functional mobility with ADLs.  Baseline: 46% 8/22: 55%  Goal status: Partially Met    PLAN: PT FREQUENCY: 2x/week  PT DURATION: 8 weeks  PLANNED INTERVENTIONS: Therapeutic exercises, Therapeutic activity, Neuromuscular re-education, Balance training, Gait training, Patient/Family education, Self Care, Joint mobilization, Stair training, Dry Needling, Cryotherapy,  Moist heat, scar mobilization, Taping, and Manual therapy  PLAN FOR NEXT SESSION: work on ROM/strengthening, dry needling, continue plan   9:33 AM, 02/12/22   Zollie Pee PT   02/12/22, 9:33 AM

## 2022-02-12 ENCOUNTER — Ambulatory Visit: Payer: Medicare Other | Attending: Orthopedic Surgery

## 2022-02-12 DIAGNOSIS — R262 Difficulty in walking, not elsewhere classified: Secondary | ICD-10-CM | POA: Diagnosis present

## 2022-02-12 DIAGNOSIS — M6281 Muscle weakness (generalized): Secondary | ICD-10-CM | POA: Insufficient documentation

## 2022-02-12 DIAGNOSIS — M25561 Pain in right knee: Secondary | ICD-10-CM | POA: Insufficient documentation

## 2022-02-12 DIAGNOSIS — M25661 Stiffness of right knee, not elsewhere classified: Secondary | ICD-10-CM | POA: Insufficient documentation

## 2022-02-19 ENCOUNTER — Ambulatory Visit: Payer: Medicare Other | Admitting: Physical Therapy

## 2022-02-20 ENCOUNTER — Encounter: Payer: Self-pay | Admitting: Oncology

## 2022-02-20 ENCOUNTER — Inpatient Hospital Stay (HOSPITAL_BASED_OUTPATIENT_CLINIC_OR_DEPARTMENT_OTHER): Payer: Medicare Other | Admitting: Oncology

## 2022-02-20 ENCOUNTER — Inpatient Hospital Stay: Payer: Medicare Other | Attending: Oncology

## 2022-02-20 VITALS — BP 153/55 | HR 53 | Temp 97.8°F | Resp 20 | Wt 286.6 lb

## 2022-02-20 DIAGNOSIS — D649 Anemia, unspecified: Secondary | ICD-10-CM | POA: Insufficient documentation

## 2022-02-20 DIAGNOSIS — Z79899 Other long term (current) drug therapy: Secondary | ICD-10-CM | POA: Insufficient documentation

## 2022-02-20 DIAGNOSIS — D693 Immune thrombocytopenic purpura: Secondary | ICD-10-CM

## 2022-02-20 LAB — CBC WITH DIFFERENTIAL/PLATELET
Abs Immature Granulocytes: 0.05 10*3/uL (ref 0.00–0.07)
Basophils Absolute: 0.1 10*3/uL (ref 0.0–0.1)
Basophils Relative: 1 %
Eosinophils Absolute: 0.3 10*3/uL (ref 0.0–0.5)
Eosinophils Relative: 3 %
HCT: 33.1 % — ABNORMAL LOW (ref 36.0–46.0)
Hemoglobin: 10.7 g/dL — ABNORMAL LOW (ref 12.0–15.0)
Immature Granulocytes: 1 %
Lymphocytes Relative: 19 %
Lymphs Abs: 1.8 10*3/uL (ref 0.7–4.0)
MCH: 29.2 pg (ref 26.0–34.0)
MCHC: 32.3 g/dL (ref 30.0–36.0)
MCV: 90.2 fL (ref 80.0–100.0)
Monocytes Absolute: 0.6 10*3/uL (ref 0.1–1.0)
Monocytes Relative: 6 %
Neutro Abs: 6.8 10*3/uL (ref 1.7–7.7)
Neutrophils Relative %: 70 %
Platelets: 164 10*3/uL (ref 150–400)
RBC: 3.67 MIL/uL — ABNORMAL LOW (ref 3.87–5.11)
RDW: 14 % (ref 11.5–15.5)
WBC: 9.6 10*3/uL (ref 4.0–10.5)
nRBC: 0 % (ref 0.0–0.2)

## 2022-02-21 ENCOUNTER — Ambulatory Visit: Payer: Medicare Other

## 2022-02-21 NOTE — Progress Notes (Signed)
Hematology/Oncology Consult note [facility]  [phone] [phone]  Patient Care Team: Marguarite Arbour, MD as PCP - General (Internal Medicine)   Name of the patient: Leslie Smith  [id number]  [birth year]   Date of visit: 02/21/22  Diagnosis- ITP  Chief complaint/ Reason for visit- routine f/u of ITP  Heme/Onc history:  Patient is a 71 year old female with a past medical history significant for hypertension, multiple sclerosis who was referred to [place] for thrombocytopenia.  Of note patient has moderate thrombocytopenia at least dating back for the last 5 years.  Her CBC in November 2015 showed white count of 6.7, H&H of 12.8/40.1 and a platelet count of 49.  Since then her platelet count has gone up and down and usually remains between 40s to 60s.  On one occasion in 2017 it did drop down to 19.  Most recent platelet count from 01/04/2019 showed white count of 9.4, H&H of 11.5/35.5 and a platelet count of 62.  Patient has undergone hysterectomy in 2011 as well as ankle surgery in 2019 despite her low platelet count and did not have any significant bleeding issues.     Results of blood work from 01/04/2019 were as follows: CBC showed white count of 9.4, H&H 11.5/35.5 and a platelet count of 62.  B12 level is low at 219.  Folate was normal.  HIV testing was negative.  Smear review was unremarkable and H. pylori stool antigen was negative    Interval history- she reports some fatigue. Denies any bleeding or bruising. Recently started oral iron supplements  ECOG PS- 1 Pain scale- 0   Review of systems- Review of Systems  Constitutional:  Positive for malaise/fatigue. Negative for chills, fever and weight loss.  HENT:  Negative for congestion, ear discharge and nosebleeds.   Eyes:  Negative for blurred vision.  Respiratory:  Negative for cough, hemoptysis, sputum production, shortness of breath and wheezing.   Cardiovascular:  Negative for  chest pain, palpitations, orthopnea and claudication.  Gastrointestinal:  Negative for abdominal pain, blood in stool, constipation, diarrhea, heartburn, melena, nausea and vomiting.  Genitourinary:  Negative for dysuria, flank pain, frequency, hematuria and urgency.  Musculoskeletal:  Negative for back pain, joint pain and myalgias.  Skin:  Negative for rash.  Neurological:  Negative for dizziness, tingling, focal weakness, seizures, weakness and headaches.  Endo/Heme/Allergies:  Does not bruise/bleed easily.  Psychiatric/Behavioral:  Negative for depression and suicidal ideas. The patient does not have insomnia.       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 ovaries had grown attached to her back bone   BRAIN SURGERY  2015   tumor resection-denies seizures   BREAST SURGERY Left 2014   biopsy. negative   HARDWARE REMOVAL Left 05/05/2018   Procedure: HARDWARE REMOVAL- LEFT ANKLE;  Surgeon: Kennedy Bucker, MD;   Location: [facility];  Service: Orthopedics;  Laterality: Left;   I & D EXTREMITY Left 08/25/2015   Procedure: IRRIGATION AND DEBRIDEMENT THUMB;  Surgeon: Bradly Bienenstock, MD;  Location: [facility];  Service: Orthopedics;  Laterality: Left;   ORIF ANKLE FRACTURE Left 02/20/2018   Procedure: OPEN REDUCTION INTERNAL FIXATION (ORIF) ANKLE FRACTURE;  Surgeon: Kennedy Bucker, MD;  Location: [facility];  Service: Orthopedics;  Laterality: Left;   TOTAL KNEE ARTHROPLASTY Right 11/21/2021   Procedure: TOTAL KNEE ARTHROPLASTY;  Surgeon: Lyndle Herrlich, MD;  Location: [facility];  Service: Orthopedics;  Laterality: Right;    Social History   Socioeconomic History   Marital status: Single    Spouse name: Not on file   Number of children: Not on file   Years of education: high school, some college    Highest education level: High school graduate  Occupational History   Occupation: Tree surgeon    Comment: disabled d/t MS  Tobacco Use   Smoking status: Never   Smokeless tobacco: Never  Vaping Use   Vaping Use: Never used  Substance and Sexual Activity   Alcohol use: Yes    Comment: occassional   Drug use: Yes    Types: Marijuana    Comment: uses for MS   Sexual activity: Not on file  Other Topics Concern   Not on file  Social History Narrative   Tai chi at [employer]    Social Determinants of Health   Financial Resource Strain: Low Risk  (12/17/2017)   Overall Financial Resource Strain (CARDIA)    Difficulty of Paying Living Expenses: Not very hard  Food Insecurity: No Food Insecurity (12/17/2017)   Hunger Vital Sign    Worried About Running Out of Food in the Last Year: Never true    Ran Out of Food in the Last Year: Never true  Transportation Needs: No Transportation Needs (12/17/2017)   PRAPARE - Administrator, Civil Service (Medical): No    Lack of Transportation (Non-Medical): No  Physical Activity: Inactive (12/17/2017)   Exercise Vital Sign    Days of Exercise per Week: 0 days    Minutes of  Exercise per Session: 0 min  Stress: No Stress Concern Present (12/17/2017)   [employer] of Occupational Health - Occupational Stress Questionnaire    Feeling of Stress : Not at all  Social Connections: Somewhat Isolated (12/17/2017)   Social Connection and Isolation Panel [NHANES]    Frequency of Communication with Friends and Family: More than three times a week    Frequency of Social Gatherings with Friends and Family: More than three times a week    Attends Religious Services: More than 4 times per year    Active Member of [employer] or Organizations: No    Attends Banker Meetings: Never    Marital Status: Never married  Intimate Partner Violence: Not At Risk (12/17/2017)   Humiliation, Afraid, Rape, and Kick questionnaire  Fear of Current or Ex-Partner: No    Emotionally Abused: No    Physically Abused: No    Sexually Abused: No    Family History  Problem Relation Age of Onset   Heart disease Father    Cancer Father      Current Outpatient Medications:    Cholecalciferol (VITAMIN D-3) 25 MCG (1000 UT) CAPS, Take 1,000 Units by mouth daily., Disp: , Rfl:    FEROSUL 325 (65 Fe) MG tablet, Take by mouth., Disp: , Rfl:    furosemide (LASIX) 20 MG tablet, Take 20 mg by mouth 2 (two) times daily., Disp: , Rfl:    losartan-hydrochlorothiazide (HYZAAR) 100-25 MG tablet, , Disp: , Rfl:    metoprolol succinate (TOPROL-XL) 50 MG 24 hr tablet, Take 50 mg by mouth daily., Disp: , Rfl:    tiZANidine (ZANAFLEX) 4 MG tablet, Take 4 mg by mouth 3 (three) times daily., Disp: , Rfl:    vitamin B-12 (CYANOCOBALAMIN) 1000 MCG tablet, Take 1,000 mcg by mouth daily., Disp: , Rfl:    aspirin 81 MG chewable tablet, Chew 1 tablet (81 mg total) by mouth 2 (two) times daily. (Patient not taking: Reported on 02/20/2022), Disp: 60 tablet, Rfl: 0   docusate sodium (COLACE) 100 MG capsule, Take 1 capsule (100 mg total) by mouth 2 (two) times daily. (Patient not taking: Reported on 02/20/2022),  Disp: 30 capsule, Rfl: 0   ezetimibe (ZETIA) 10 MG tablet, Take 10 mg by mouth daily. (Patient not taking: Reported on 02/20/2022), Disp: , Rfl:    fluticasone (FLONASE) 50 MCG/ACT nasal spray, Place into the nose., Disp: , Rfl:    hydrALAZINE (APRESOLINE) 50 MG tablet, Take 1 tablet by mouth 2 (two) times daily., Disp: , Rfl:    HYDROcodone-acetaminophen (NORCO/VICODIN) 5-325 MG tablet, Take 1 tablet by mouth every 4 (four) hours as needed for moderate pain (pain score 4-6). (Patient not taking: Reported on 02/20/2022), Disp: 30 tablet, Rfl: 0   ibuprofen (IBU) 800 MG tablet, Take 1 tablet (800 mg total) by mouth every 8 (eight) hours as needed for moderate pain. (Patient not taking: Reported on 01/30/2021), Disp: 30 tablet, Rfl: 6   losartan (COZAAR) 100 MG tablet, , Disp: , Rfl:    traMADol (ULTRAM) 50 MG tablet, Take 1 tablet every 6 hours by oral route as needed. (Patient not taking: Reported on 02/20/2022), Disp: , Rfl:   Physical exam:  Vitals:   02/20/22 1416  BP: (!) 153/55  Pulse: (!) 53  Resp: 20  Temp: 97.8 F (36.6 C)  SpO2: 98%  Weight: 286 lb 9.6 oz (130 kg)   Physical Exam Constitutional:      General: She is not in acute distress. Cardiovascular:     Rate and Rhythm: Normal rate and regular rhythm.     Heart sounds: Normal heart sounds.  Pulmonary:     Effort: Pulmonary effort is normal.     Breath sounds: Normal breath sounds.  Abdominal:     General: Bowel sounds are normal.     Palpations: Abdomen is soft.  Skin:    General: Skin is warm and dry.  Neurological:     Mental Status: She is alert and oriented to person, place, and time.         Latest Ref Rng & Units 11/21/2021    1:41 PM  CMP  Glucose 70 - 99 mg/dL 92   BUN 8 - 23 mg/dL 26   Creatinine 8.17 - 1.00 mg/dL 9.97  Sodium 135 - 145 mmol/L 140   Potassium 3.5 - 5.1 mmol/L 4.0   Chloride 98 - 111 mmol/L 107   CO2 22 - 32 mmol/L 25   Calcium 8.9 - 10.3 mg/dL 9.3       Latest Ref Rng & Units  02/20/2022    1:43 PM  CBC  WBC 4.0 - 10.5 K/uL 9.6   Hemoglobin 12.0 - 15.0 g/dL 10.7   Hematocrit 36.0 - 46.0 % 33.1   Platelets 150 - 400 K/uL 164     No images are attached to the encounter.  US Venous Img Lower Unilateral Right (DVT)  Result Date: 02/05/2022 CLINICAL DATA:  Right calf pain. EXAM: RIGHT LOWER EXTREMITY VENOUS DOPPLER ULTRASOUND TECHNIQUE: Gray-scale sonography with graded compression, as well as color Doppler and duplex ultrasound were performed to evaluate the lower extremity deep venous systems from the level of the common femoral vein and including the common femoral, femoral, profunda femoral, popliteal and calf veins including the posterior tibial, peroneal and gastrocnemius veins when visible. The superficial great saphenous vein was also interrogated. Spectral Doppler was utilized to evaluate flow at rest and with distal augmentation maneuvers in the common femoral, femoral and popliteal veins. COMPARISON:  None Available. FINDINGS: Contralateral Common Femoral Vein: Respiratory phasicity is normal and symmetric with the symptomatic side. No evidence of thrombus. Normal compressibility. Common Femoral Vein: No evidence of thrombus. Normal compressibility, respiratory phasicity and response to augmentation. Saphenofemoral Junction: No evidence of thrombus. Normal compressibility and flow on color Doppler imaging. Profunda Femoral Vein: No evidence of thrombus. Normal compressibility and flow on color Doppler imaging. Femoral Vein: No evidence of thrombus. Normal compressibility, respiratory phasicity and response to augmentation. Popliteal Vein: No evidence of thrombus. Normal compressibility, respiratory phasicity and response to augmentation. Calf Veins: No evidence of thrombus. Normal compressibility and flow on color Doppler imaging. Superficial Great Saphenous Vein: No evidence of thrombus. Normal compressibility. Venous Reflux:  None. Other Findings: No evidence of  superficial thrombophlebitis or abnormal fluid collection. IMPRESSION: No evidence of right lower extremity deep venous thrombosis. Electronically Signed   By: Aletta Edouard M.D.   On: 02/05/2022 08:55     Assessment and plan- Patient is a 71 y.o. female who is here for following issues':  ITP- over the last 1 year, platelet count has gone up steadily and is presently normal. Continue to monitor  2. Normocytic anemia:baseline hb runs between 11-12. Presently down to 10.7. CBC with differential, CMP, ferritin and iron studies, folate, reticulocyte count LDH haptoglobin myeloma panel and serum free light chains in 2 months and see Dr. Janese Banks   Visit Diagnosis 1. Chronic ITP (idiopathic thrombocytopenia) (HCC)   2. Normocytic anemia      Dr. Randa Evens, MD, MPH [facility] [phone] 02/21/2022 12:46 PM

## 2022-02-26 ENCOUNTER — Ambulatory Visit: Payer: Medicare Other

## 2022-02-26 DIAGNOSIS — M6281 Muscle weakness (generalized): Secondary | ICD-10-CM

## 2022-02-26 DIAGNOSIS — M25661 Stiffness of right knee, not elsewhere classified: Secondary | ICD-10-CM

## 2022-02-26 DIAGNOSIS — M25561 Pain in right knee: Secondary | ICD-10-CM | POA: Diagnosis not present

## 2022-02-26 DIAGNOSIS — R262 Difficulty in walking, not elsewhere classified: Secondary | ICD-10-CM

## 2022-02-26 NOTE — Therapy (Signed)
OUTPATIENT PHYSICAL THERAPY LOWER EXTREMITY Treatment/RECERT   Patient Name: Leslie Smith MRN: [id number] DOB:1950-11-02, 71 y.o., female Today's Date: 02/27/2022   PT End of Session - 02/26/22 0819     Visit Number 14    Number of Visits 37    Date for PT Re-Evaluation 05/21/22    Authorization Type Medicare Parts A & B    Authorization Time Period 12/25/21-02/20/22    Progress Note Due on Visit 20    PT Start Time 0806    PT Stop Time 4650    PT Time Calculation (min) 41 min    Activity Tolerance Patient tolerated treatment well;Patient limited by pain    Behavior During Therapy [facility] for tasks assessed/performed                     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[place]) 09/15/2014   Bladder incontinence    Cancer ([place]) 2014   brain tumor, resected   Cellulitis and abscess of leg 2019   2 episodes this year   Complication of anesthesia    DJD (degenerative joint disease) of knee 2018   Fall    Fatigue    History of blood transfusion    Hypertension    MS (multiple sclerosis) ([place])    Myelitis ([place])    Neuromuscular disorder ([place]) 2001   multiple sclerosis diagnosed by MRI   PONV (postoperative nausea and vomiting)    severe   Thrombocytopenia ([place])    Past Surgical History:  Procedure Laterality Date   ABDOMINAL HYSTERECTOMY  2011   required wound vac for 6 weeks. ovaries had grown attached to her back bone   BRAIN SURGERY  2015   tumor resection-denies seizures   BREAST SURGERY Left 2014   biopsy. negative   HARDWARE REMOVAL Left 05/05/2018   Procedure: HARDWARE REMOVAL- LEFT ANKLE;  Surgeon: Hessie Knows, MD;  Location: [facility];  Service: Orthopedics;  Laterality: Left;   I & D EXTREMITY Left 08/25/2015   Procedure: IRRIGATION AND DEBRIDEMENT THUMB;  Surgeon: Iran Planas, MD;  Location: [place];  Service: Orthopedics;  Laterality: Left;   ORIF ANKLE FRACTURE Left 02/20/2018   Procedure: OPEN REDUCTION INTERNAL FIXATION  (ORIF) ANKLE FRACTURE;  Surgeon: Hessie Knows, MD;  Location: [facility];  Service: Orthopedics;  Laterality: Left;   TOTAL KNEE ARTHROPLASTY Right 11/21/2021   Procedure: TOTAL KNEE ARTHROPLASTY;  Surgeon: Lovell Sheehan, MD;  Location: [facility];  Service: Orthopedics;  Laterality: Right;   Patient Active Problem List   Diagnosis Date Noted   S/P TKR (total knee replacement) using cement, right 11/21/2021   Atherosclerosis of abdominal aorta ([place]) 01/10/2021   Coronary artery disease involving native coronary artery of native heart 01/10/2021   OSA (obstructive sleep apnea) 04/19/2020   Statin intolerance 01/14/2020   S/P ORIF (open reduction internal fixation) fracture 02/20/2018   DJD (degenerative joint disease) of knee 01/02/2017   Advanced care planning/counseling discussion 01/02/2017   Thrombocytopenia ([place]) 10/30/2015   Multiple sclerosis ([place])    Essential hypertension    Hyperlipidemia    Benign neoplasm of brain ([place]) 04/30/2013   Cerebral meningioma ([place]) 04/29/2013   Malignant hypertension 04/29/2013   Obesity 04/15/2013    PCP: Fulton Reek, MD  REFERRING PROVIDER: Kurtis Bushman MD  REFERRING DIAG: R TKA, 11/21/21  THERAPY DIAG:  Stiffness of right knee, not elsewhere classified  Difficulty in walking, not elsewhere classified  Muscle weakness (generalized)  Chronic  pain of right knee  Rationale for Evaluation and Treatment Rehabilitation  ONSET DATE: 11/21/21  SUBJECTIVE:   SUBJECTIVE STATEMENT: Pt reports some back pain currently. She missed last week's appointments because she was feeling unwell. She rates her current pain as 6/10 (back pain)  Are you having pain? Yes: NPRS scale: 6/10 Pain location: back Pain description: ache Aggravating factors:  Relieving factors: movement    PERTINENT HISTORY: 71 yo Female s/p R TKA on 11/21/21. Patient reports having home health PT for 2-3 weeks. She reports doing well. She presents to therapy without AD.  She reports not needing AD at home. She has been sleeping with CPAP and cryocuff on RLE- but has been sleeping well; PMH significant: MS, Sleep apnea, 2014 brain tumor, history of LE cellulitis, intermittent fluid build up/swelling in LE, DJD, HTN, Thrombocytopenia; Pt is followed by PCP regarding LE swelling; Pt  back on BP medications.   Are you having pain? Yes: NPRS scale: 3/10 Pain location: right calf Pain description: dull Aggravating factors: worse in the evening/swelling;  Relieving factors: pain medicine- has limited some; ice;   PRECAUTIONS: Fall Risk  WEIGHT BEARING RESTRICTIONS No  FALLS:  Has patient fallen in last 6 months? Yes. Number of falls 1  LIVING ENVIRONMENT: Lives with: lives alone Lives in: House/apartment Stairs: Yes: Internal: 13 steps; on right going up and External: 3 steps; has ramp Has following equipment at home: Single point cane, Walker - 2 wheeled, Environmental consultant - 4 wheeled, Crutches, bed side commode, and Grab bars  OCCUPATION: Retired Emergency planning/management officer  PLOF: Independent with basic ADLs, Independent with gait, and Independent with transfers   Claude transfers, increase endurance/activity tolerance, Improve strength Pt would like to be able to clean her house and be more active at home.     TODAY'S TREATMENT: 02/27/2022  TherAct: Goals reassessed for progress note. PT instructed pt in technique with goal testing and indications of test performance throughout. Please refer to goal section for details.   Other interventions performed -   Manual: IASTM to R gastrocsol and distal medial hamstring with addition of TrP release x several minutes with pt in prone. Pt continues to exhibit two primary TrPs in each region.   PATIENT EDUCATION:  Education details: Goal reassessment, plan. Pt educated throughout session about proper posture and technique with exercises. Improved exercise technique, movement at target joints, use of target muscles  after min to mod verbal, visual, tactile cues.  Person educated: Patient Education method: Explanation, Demonstration, and Verbal cues Education comprehension: verbalized understanding, returned demonstration, verbal cues required, and needs further education   HOME EXERCISE PROGRAM:  No updates today. Pt to continue HEP as previously given: Continue as given from home health PT Reinforced importance of ROM including quad sets and LAQ/knee flexion;   ASSESSMENT:  CLINICAL IMPRESSION: Goals reassessed for recert. Pt making gains AEB achieving stair goal, 5xSTS goal and improving FOTO goal (previously achieved). This indicates increased functional mobility, BLE strength/power, and decreased fall risk. While pt shows gains, she is still limited in her ability to ambulate due to RLE pain and due to decreased endurance. PT has added two new goals to address these remaining deficits. Patient's condition has the potential to improve in response to therapy. Maximum improvement is yet to be obtained. The anticipated improvement is attainable and reasonable in a generally predictable time.  Pt would benefit from further skilled therapy to address remaining deficits in order to improve pain, balance, endurance, strength and mobility.  OBJECTIVE IMPAIRMENTS Abnormal gait, cardiopulmonary status limiting activity, decreased activity tolerance, decreased balance, decreased endurance, decreased mobility, difficulty walking, decreased ROM, decreased strength, hypomobility, impaired flexibility, and pain.   ACTIVITY LIMITATIONS bending, standing, squatting, stairs, transfers, and locomotion level  PARTICIPATION LIMITATIONS: cleaning, laundry, shopping, community activity, occupation, yard work, and church  PERSONAL FACTORS Age, Fitness, and 3+ comorbidities: MS, Sleep apnea, 2014 brain tumor, history of LE cellulitis, intermittent fluid build up/swelling in LE, DJD, HTN, Thrombocytopenia;   are also  affecting patient's functional outcome.   REHAB POTENTIAL: Good  CLINICAL DECISION MAKING: Stable/uncomplicated  EVALUATION COMPLEXITY: Low   GOALS: Goals reviewed with patient? Yes  SHORT TERM GOALS: Target date: 01/23/2022  Patient will be adherent to HEP at least 3x a week to improve functional strength and balance for better safety at home. Baseline: Doing some exercise from home health 8/22: yes; 9/19: feels confident/indep but states, "not doing as much as I should" (previously met) Goal status: MET  2.  Patient will improve RLE knee extension to 0 to improve knee ROM for functional tasks such as standing/walking.  Baseline: RLE: -5 degrees in supine 8/22: neutral  Goal status: MET   LONG TERM GOALS: Target date: 05/21/2022  Patient will increase 10 meter walk test to >1.38ms as to improve gait speed for better community ambulation and to reduce fall risk. Baseline: 0.98 m/s 8/22: 7.72 seconds =1.29 m/s  Goal status: MET  2.  Patient will tolerate 5 seconds of single leg stance without loss of balance to improve ability to get in and out of shower safely. Baseline: RLE: 3-5 sec inconsistent 8/22: 5 seconds Goal status: MET  3.   Patient will be independent with ascend/descend 12 steps using single UE in step over step pattern without LOB to safely get up/down stairs in home. Baseline: Not formally assessed, negotiates one step at a time; limits steps when possible 8/22: reciprocal ascending; step to descending; 9/19:able to perform recip pattern ascending/descending  Goal status: Met  4.  Pt will improve RLE knee flexion to >115 degrees for functional ROM for transfers and step negotiation;  Baseline: RLE: 108 degrees 8/22: 125  Goal status: MET  5.  Patient (> 646years old) will complete five times sit to stand test in < 15 seconds indicating an increased LE strength and improved balance. Baseline: 19.95 sec 8/22: 15.65 seconds; 9/19: 14.5 sec hands-free Goal status:  Met  6.  Patient will improve FOTO score by 5% to indicate improved functional mobility with ADLs.  Baseline: 46% 8/22: 55%; 9/19: 57% Goal status: Met   7. Patient will increase six minute walk test distance to >1300 for improved functional capacity and gait ability. Baseline: 9/19: 1190 ft with one rest break due to fatigue, rates completing test as difficult Goal status: NEW    8. Patient will report a worst R LE pain as no greater than 1/10 on VAS in order to improve tolerance with ADLs and reduced symptoms with activities.  Baseline: 9/19: pain still greater than 3-4/10.  Goal status: NEW  PLAN: PT FREQUENCY: 2x/week  PT DURATION: 12 weeks  PLANNED INTERVENTIONS: Therapeutic exercises, Therapeutic activity, Neuromuscular re-education, Balance training, Gait training, Patient/Family education, Self Care, Joint mobilization, Stair training, Dry Needling, Cryotherapy, Moist heat, scar mobilization, Taping, and Manual therapy  PLAN FOR NEXT SESSION: work on ROM/strengthening, dry needling, endurance, continue plan   8:28 AM, 02/27/22   HZollie PeePT   02/27/22, 8:28 AM

## 2022-03-01 ENCOUNTER — Ambulatory Visit: Payer: Medicare Other

## 2022-03-05 ENCOUNTER — Ambulatory Visit: Payer: Medicare Other | Admitting: Physical Therapy

## 2022-03-08 ENCOUNTER — Ambulatory Visit: Payer: Medicare Other

## 2022-03-11 ENCOUNTER — Ambulatory Visit: Payer: Medicare Other | Attending: Orthopedic Surgery

## 2022-03-11 DIAGNOSIS — R278 Other lack of coordination: Secondary | ICD-10-CM | POA: Diagnosis present

## 2022-03-11 DIAGNOSIS — M5459 Other low back pain: Secondary | ICD-10-CM | POA: Diagnosis present

## 2022-03-11 DIAGNOSIS — R2681 Unsteadiness on feet: Secondary | ICD-10-CM | POA: Insufficient documentation

## 2022-03-11 DIAGNOSIS — M25661 Stiffness of right knee, not elsewhere classified: Secondary | ICD-10-CM | POA: Insufficient documentation

## 2022-03-11 DIAGNOSIS — R262 Difficulty in walking, not elsewhere classified: Secondary | ICD-10-CM | POA: Diagnosis present

## 2022-03-11 DIAGNOSIS — M6281 Muscle weakness (generalized): Secondary | ICD-10-CM | POA: Insufficient documentation

## 2022-03-11 DIAGNOSIS — R269 Unspecified abnormalities of gait and mobility: Secondary | ICD-10-CM | POA: Diagnosis present

## 2022-03-11 DIAGNOSIS — R2689 Other abnormalities of gait and mobility: Secondary | ICD-10-CM | POA: Diagnosis present

## 2022-03-11 DIAGNOSIS — M25561 Pain in right knee: Secondary | ICD-10-CM | POA: Insufficient documentation

## 2022-03-11 NOTE — Therapy (Signed)
OUTPATIENT PHYSICAL THERAPY LOWER EXTREMITY Treatment  Patient Name: Leslie Smith MRN: [id number] DOB:10-31-1950, 71 y.o., female Today's Date: 03/11/2022   PT End of Session - 03/11/22 1527     Visit Number 15    Number of Visits 72    Date for PT Re-Evaluation 05/21/22    Authorization Type Medicare Parts A & B    Authorization Time Period 12/25/21-02/20/22    Progress Note Due on Visit 31    PT Start Time 1432    PT Stop Time 3546    PT Time Calculation (min) 42 min    Equipment Utilized During Treatment Gait belt    Activity Tolerance Patient tolerated treatment well    Behavior During Therapy WFL for tasks assessed/performed                      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[place]) 09/15/2014   Bladder incontinence    Cancer ([place]) 2014   brain tumor, resected   Cellulitis and abscess of leg 2019   2 episodes this year   Complication of anesthesia    DJD (degenerative joint disease) of knee 2018   Fall    Fatigue    History of blood transfusion    Hypertension    MS (multiple sclerosis) ([place])    Myelitis ([place])    Neuromuscular disorder ([place]) 2001   multiple sclerosis diagnosed by MRI   PONV (postoperative nausea and vomiting)    severe   Thrombocytopenia ([place])    Past Surgical History:  Procedure Laterality Date   ABDOMINAL HYSTERECTOMY  2011   required wound vac for 6 weeks. ovaries had grown attached to her back bone   BRAIN SURGERY  2015   tumor resection-denies seizures   BREAST SURGERY Left 2014   biopsy. negative   HARDWARE REMOVAL Left 05/05/2018   Procedure: HARDWARE REMOVAL- LEFT ANKLE;  Surgeon: Hessie Knows, MD;  Location: [facility];  Service: Orthopedics;  Laterality: Left;   I & D EXTREMITY Left 08/25/2015   Procedure: IRRIGATION AND DEBRIDEMENT THUMB;  Surgeon: Iran Planas, MD;  Location: [place];  Service: Orthopedics;  Laterality: Left;   ORIF ANKLE FRACTURE Left 02/20/2018   Procedure: OPEN REDUCTION  INTERNAL FIXATION (ORIF) ANKLE FRACTURE;  Surgeon: Hessie Knows, MD;  Location: [facility];  Service: Orthopedics;  Laterality: Left;   TOTAL KNEE ARTHROPLASTY Right 11/21/2021   Procedure: TOTAL KNEE ARTHROPLASTY;  Surgeon: Lovell Sheehan, MD;  Location: [facility];  Service: Orthopedics;  Laterality: Right;   Patient Active Problem List   Diagnosis Date Noted   S/P TKR (total knee replacement) using cement, right 11/21/2021   Atherosclerosis of abdominal aorta ([place]) 01/10/2021   Coronary artery disease involving native coronary artery of native heart 01/10/2021   OSA (obstructive sleep apnea) 04/19/2020   Statin intolerance 01/14/2020   S/P ORIF (open reduction internal fixation) fracture 02/20/2018   DJD (degenerative joint disease) of knee 01/02/2017   Advanced care planning/counseling discussion 01/02/2017   Thrombocytopenia ([place]) 10/30/2015   Multiple sclerosis ([place])    Essential hypertension    Hyperlipidemia    Benign neoplasm of brain ([place]) 04/30/2013   Cerebral meningioma ([place]) 04/29/2013   Malignant hypertension 04/29/2013   Obesity 04/15/2013    PCP: Fulton Reek, MD  REFERRING PROVIDER: Kurtis Bushman MD  REFERRING DIAG: R TKA, 11/21/21  THERAPY DIAG:  Muscle weakness (generalized)  Stiffness of right knee, not elsewhere classified  Difficulty in  walking, not elsewhere classified  Other low back pain  Rationale for Evaluation and Treatment Rehabilitation  ONSET DATE: 11/21/21  SUBJECTIVE:   SUBJECTIVE STATEMENT: Pt mowed prior to session and feels tired. Pt reports her back still hurts and is going to follow up with her physician about it. Reports RLE improved with decreased activity, but today it is worse. Pt reports she did have leg cramps that went into her feet.  Are you having pain? Yes: NPRS scale: 4-5/10 Pain location: back Pain description: ache Aggravating factors:  Relieving factors: movement    PERTINENT HISTORY: 71 yo Female s/p R TKA on  11/21/21. Patient reports having home health PT for 2-3 weeks. She reports doing well. She presents to therapy without AD. She reports not needing AD at home. She has been sleeping with CPAP and cryocuff on RLE- but has been sleeping well; PMH significant: MS, Sleep apnea, 2014 brain tumor, history of LE cellulitis, intermittent fluid build up/swelling in LE, DJD, HTN, Thrombocytopenia; Pt is followed by PCP regarding LE swelling; Pt  back on BP medications.   Are you having pain? Yes: NPRS scale: 3/10 Pain location: right calf Pain description: dull Aggravating factors: worse in the evening/swelling;  Relieving factors: pain medicine- has limited some; ice;   PRECAUTIONS: Fall Risk  WEIGHT BEARING RESTRICTIONS No  FALLS:  Has patient fallen in last 6 months? Yes. Number of falls 1  LIVING ENVIRONMENT: Lives with: lives alone Lives in: House/apartment Stairs: Yes: Internal: 13 steps; on right going up and External: 3 steps; has ramp Has following equipment at home: Single point cane, Walker - 2 wheeled, Environmental consultant - 4 wheeled, Crutches, bed side commode, and Grab bars  OCCUPATION: Retired Emergency planning/management officer  PLOF: Independent with basic ADLs, Independent with gait, and Independent with transfers   [place] transfers, increase endurance/activity tolerance, Improve strength Pt would like to be able to clean her house and be more active at home.     TODAY'S TREATMENT: 03/11/2022   Manual: pt prone on plinth Roller assisted STM to R gastrocsol complex with addition of TrP release to 1 primary TrP x several minutes . Pt did report improvement at end of session in pain following manual and therex.   TherEx:  Isometric RLE knee flexion 10x 5 sec holds Prone R hip ext. With knee flexeed 90 deg 2x10  STS 10x  Nustep - interval training - PT adjusts intensity throughout to maintain therapeutic range and as appropriate for pt response to intensity lvl 1 x 1 min 30 sec warm-up  Lvl  3 x 1 min 30 sec  Lvl 5 x 1 min 30 sec  Lvl 3 x 1 min 30 sec  Lvl 1 x 1 min cool-down  Amb for endurance 2x148 ft. Fatiguing, performed immediately following nustep interval training to minimize rest and maintain therapeutic effect. Pt requires rest break following intervention  2.5# weights donned, standing at support surface: Hip abd 2x15 each LE Hip ext 1x15 each LE - limited due to back pain  Heel raises 2x15 B   Leg press 40# 2x15 BLE    PATIENT EDUCATION:  Education details:  Pt educated throughout session about proper posture and technique with exercises. Improved exercise technique, movement at target joints, use of target muscles after min to mod verbal, visual, tactile cues.  Person educated: Patient Education method: Explanation, Demonstration, and Verbal cues Education comprehension: verbalized understanding, returned demonstration, verbal cues required, and needs further education   HOME EXERCISE PROGRAM:  No updates today. Pt to continue HEP as previously given: Continue as given from home health PT Reinforced importance of ROM including quad sets and LAQ/knee flexion;   ASSESSMENT:  CLINICAL IMPRESSION:  Pt reports improvement in pain symptoms in RLE following manual and therex today. She tolerates all therex fair without increased RLE pain, but is limited with standing hip ext due to LBP and fatigues quickly in general. Will continue to focus on interventions that promote both LE mm strength and endurance. Will continue to monitor pt's LBP. Pt would benefit from further skilled therapy to address remaining deficits in order to improve pain, balance, endurance, strength and mobility.      OBJECTIVE IMPAIRMENTS Abnormal gait, cardiopulmonary status limiting activity, decreased activity tolerance, decreased balance, decreased endurance, decreased mobility, difficulty walking, decreased ROM, decreased strength, hypomobility, impaired flexibility, and pain.   ACTIVITY  LIMITATIONS bending, standing, squatting, stairs, transfers, and locomotion level  PARTICIPATION LIMITATIONS: cleaning, laundry, shopping, community activity, occupation, yard work, and church  PERSONAL FACTORS Age, Fitness, and 3+ comorbidities: MS, Sleep apnea, 2014 brain tumor, history of LE cellulitis, intermittent fluid build up/swelling in LE, DJD, HTN, Thrombocytopenia;   are also affecting patient's functional outcome.   REHAB POTENTIAL: Good  CLINICAL DECISION MAKING: Stable/uncomplicated  EVALUATION COMPLEXITY: Low   GOALS: Goals reviewed with patient? Yes  SHORT TERM GOALS: Target date: 01/23/2022  Patient will be adherent to HEP at least 3x a week to improve functional strength and balance for better safety at home. Baseline: Doing some exercise from home health 8/22: yes; 9/19: feels confident/indep but states, "not doing as much as I should" (previously met) Goal status: MET  2.  Patient will improve RLE knee extension to 0 to improve knee ROM for functional tasks such as standing/walking.  Baseline: RLE: -5 degrees in supine 8/22: neutral  Goal status: MET   LONG TERM GOALS: Target date: 05/21/2022  Patient will increase 10 meter walk test to >1.56m/s as to improve gait speed for better community ambulation and to reduce fall risk. Baseline: 0.98 m/s 8/22: 7.72 seconds =1.29 m/s  Goal status: MET  2.  Patient will tolerate 5 seconds of single leg stance without loss of balance to improve ability to get in and out of shower safely. Baseline: RLE: 3-5 sec inconsistent 8/22: 5 seconds Goal status: MET  3.   Patient will be independent with ascend/descend 12 steps using single UE in step over step pattern without LOB to safely get up/down stairs in home. Baseline: Not formally assessed, negotiates one step at a time; limits steps when possible 8/22: reciprocal ascending; step to descending; 9/19:able to perform recip pattern ascending/descending  Goal status: Met  4.   Pt will improve RLE knee flexion to >115 degrees for functional ROM for transfers and step negotiation;  Baseline: RLE: 108 degrees 8/22: 125  Goal status: MET  5.  Patient (> 90 years old) will complete five times sit to stand test in < 15 seconds indicating an increased LE strength and improved balance. Baseline: 19.95 sec 8/22: 15.65 seconds; 9/19: 14.5 sec hands-free Goal status: Met  6.  Patient will improve FOTO score by 5% to indicate improved functional mobility with ADLs.  Baseline: 46% 8/22: 55%; 9/19: 57% Goal status: Met   7. Patient will increase six minute walk test distance to >1300 for improved functional capacity and gait ability. Baseline: 9/19: 1190 ft with one rest break due to fatigue, rates completing test as difficult Goal status: NEW  8. Patient will report a worst R LE pain as no greater than 1/10 on VAS in order to improve tolerance with ADLs and reduced symptoms with activities.  Baseline: 9/19: pain still greater than 3-4/10.  Goal status: NEW  PLAN: PT FREQUENCY: 2x/week  PT DURATION: 12 weeks  PLANNED INTERVENTIONS: Therapeutic exercises, Therapeutic activity, Neuromuscular re-education, Balance training, Gait training, Patient/Family education, Self Care, Joint mobilization, Stair training, Dry Needling, Cryotherapy, Moist heat, scar mobilization, Taping, and Manual therapy  PLAN FOR NEXT SESSION: work on ROM/strengthening, dry needling, endurance, continue plan   3:33 PM, 03/11/22   Zollie Pee PT   03/11/22, 3:33 PM

## 2022-03-12 ENCOUNTER — Encounter: Payer: Medicare Other | Admitting: Physical Therapy

## 2022-03-19 ENCOUNTER — Encounter: Payer: Medicare Other | Admitting: Physical Therapy

## 2022-03-21 ENCOUNTER — Ambulatory Visit: Payer: Medicare Other

## 2022-03-21 DIAGNOSIS — R2689 Other abnormalities of gait and mobility: Secondary | ICD-10-CM

## 2022-03-21 DIAGNOSIS — R278 Other lack of coordination: Secondary | ICD-10-CM

## 2022-03-21 DIAGNOSIS — M6281 Muscle weakness (generalized): Secondary | ICD-10-CM

## 2022-03-21 DIAGNOSIS — M25661 Stiffness of right knee, not elsewhere classified: Secondary | ICD-10-CM

## 2022-03-21 DIAGNOSIS — M5459 Other low back pain: Secondary | ICD-10-CM

## 2022-03-21 DIAGNOSIS — M25561 Pain in right knee: Secondary | ICD-10-CM

## 2022-03-21 DIAGNOSIS — R2681 Unsteadiness on feet: Secondary | ICD-10-CM

## 2022-03-21 DIAGNOSIS — R269 Unspecified abnormalities of gait and mobility: Secondary | ICD-10-CM

## 2022-03-21 DIAGNOSIS — R262 Difficulty in walking, not elsewhere classified: Secondary | ICD-10-CM

## 2022-03-21 NOTE — Therapy (Signed)
OUTPATIENT PHYSICAL THERAPY LOWER EXTREMITY Treatment  Patient Name: Leslie Smith MRN: [id number] DOB:02-Sep-1950, 71 y.o., female Today's Date: 03/21/2022   PT End of Session - 03/21/22 0931     Visit Number 16    Number of Visits 53    Date for PT Re-Evaluation 05/21/22    Authorization Type Medicare Parts A & B    Authorization Time Period 12/25/21-02/20/22    Progress Note Due on Visit 20    PT Start Time 0932    PT Stop Time 1020    PT Time Calculation (min) 48 min    Equipment Utilized During Treatment Gait belt    Activity Tolerance Patient tolerated treatment well    Behavior During Therapy WFL for tasks assessed/performed                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[place]) 09/15/2014   Bladder incontinence    Cancer ([facility]) 2014   brain tumor, resected   Cellulitis and abscess of leg 2019   2 episodes this year   Complication of anesthesia    DJD (degenerative joint disease) of knee 2018   Fall    Fatigue    History of blood transfusion    Hypertension    MS (multiple sclerosis) ([place])    Myelitis ([place])    Neuromuscular disorder ([place]) 2001   multiple sclerosis diagnosed by MRI   PONV (postoperative nausea and vomiting)    severe   Thrombocytopenia ([place])    Past Surgical History:  Procedure Laterality Date   ABDOMINAL HYSTERECTOMY  2011   required wound vac for 6 weeks. ovaries had grown attached to her back bone   BRAIN SURGERY  2015   tumor resection-denies seizures   BREAST SURGERY Left 2014   biopsy. negative   HARDWARE REMOVAL Left 05/05/2018   Procedure: HARDWARE REMOVAL- LEFT ANKLE;  Surgeon: Hessie Knows, MD;  Location: [facility];  Service: Orthopedics;  Laterality: Left;   I & D EXTREMITY Left 08/25/2015   Procedure: IRRIGATION AND DEBRIDEMENT THUMB;  Surgeon: Iran Planas, MD;  Location: [facility] [place];  Service: Orthopedics;  Laterality: Left;   ORIF ANKLE FRACTURE Left 02/20/2018   Procedure: OPEN REDUCTION INTERNAL  FIXATION (ORIF) ANKLE FRACTURE;  Surgeon: Hessie Knows, MD;  Location: [facility];  Service: Orthopedics;  Laterality: Left;   TOTAL KNEE ARTHROPLASTY Right 11/21/2021   Procedure: TOTAL KNEE ARTHROPLASTY;  Surgeon: Lovell Sheehan, MD;  Location: [facility];  Service: Orthopedics;  Laterality: Right;   Patient Active Problem List   Diagnosis Date Noted   S/P TKR (total knee replacement) using cement, right 11/21/2021   Atherosclerosis of abdominal aorta ([place]) 01/10/2021   Coronary artery disease involving native coronary artery of native heart 01/10/2021   OSA (obstructive sleep apnea) 04/19/2020   Statin intolerance 01/14/2020   S/P ORIF (open reduction internal fixation) fracture 02/20/2018   DJD (degenerative joint disease) of knee 01/02/2017   Advanced care planning/counseling discussion 01/02/2017   Thrombocytopenia ([place]) 10/30/2015   Multiple sclerosis ([place])    Essential hypertension    Hyperlipidemia    Benign neoplasm of brain ([place]) 04/30/2013   Cerebral meningioma ([place]) 04/29/2013   Malignant hypertension 04/29/2013   Obesity 04/15/2013    PCP: Fulton Reek, MD  REFERRING PROVIDER: Kurtis Bushman MD  REFERRING DIAG: R TKA, 11/21/21  THERAPY DIAG:  Muscle weakness (generalized)  Stiffness of right knee, not elsewhere classified  Difficulty in walking, not elsewhere classified  Other  low back pain  Chronic pain of right knee  Unsteadiness on feet  Other abnormalities of gait and mobility  Other lack of coordination  Abnormality of gait and mobility  Rationale for Evaluation and Treatment Rehabilitation  ONSET DATE: 11/21/21  SUBJECTIVE:   SUBJECTIVE STATEMENT: Pt reports that she continues to have back pain and is now having pain on the top of her feet. Knee is feeling fine today. Pt reports she has not been as compliant with HEP due to pain in feet. 3 nights ago pt experienced significant bil foot pain.  Are you having pain? Yes: NPRS scale: 6/10 Pain  location: top of feet, back Pain description: nerve pain in feet, achey back pain  Aggravating factors:  Relieving factors: movement    PERTINENT HISTORY: 71 yo Female s/p R TKA on 11/21/21. Patient reports having home health PT for 2-3 weeks. She reports doing well. She presents to therapy without AD. She reports not needing AD at home. She has been sleeping with CPAP and cryocuff on RLE- but has been sleeping well; PMH significant: MS, Sleep apnea, 2014 brain tumor, history of LE cellulitis, intermittent fluid build up/swelling in LE, DJD, HTN, Thrombocytopenia; Pt is followed by PCP regarding LE swelling; Pt  back on BP medications.   Are you having pain? Yes: NPRS scale: 3/10 Pain location: right calf Pain description: dull Aggravating factors: worse in the evening/swelling;  Relieving factors: pain medicine- has limited some; ice;   PRECAUTIONS: Fall Risk  WEIGHT BEARING RESTRICTIONS No  FALLS:  Has patient fallen in last 6 months? Yes. Number of falls 1  LIVING ENVIRONMENT: Lives with: lives alone Lives in: House/apartment Stairs: Yes: Internal: 13 steps; on right going up and External: 3 steps; has ramp Has following equipment at home: Single point cane, Walker - 2 wheeled, Environmental consultant - 4 wheeled, Crutches, bed side commode, and Grab bars  OCCUPATION: Retired Emergency planning/management officer  PLOF: Independent with basic ADLs, Independent with gait, and Independent with transfers   [place] transfers, increase endurance/activity tolerance, Improve strength Pt would like to be able to clean her house and be more active at home.     TODAY'S TREATMENT: 03/21/2022   Manual: pt prone on plinth STM to R gastrocsol complex with addition of TrP release to multiple TrP in lateral gastroc head/bicep femoris. Pt did report improvement at end of session in pain following manual and therex.   TherEx:  Seated HS stretch bil, 2x30s - unable to tolerate IV bias secondary to foot pain  STS  10x from plinth  SLS, 3x20s, UUE support on plinth - increased ankle righting reaction strategies on LLE. Cues for hip stabilization on L due to hip drop  Standing hip ABD/ext x8 bil - UUE support  Nustep - interval training - PT adjusts intensity throughout to maintain therapeutic range and as appropriate for pt response to intensity Seat 9, UE 5 Lvl 1 x 61mn warm-up  Lvl 3 x 227m  Lvl 5 x 3m80mLvl 3 x 3mi59mvl 1 x 1 min cool-down  Amb for endurance 2x148 ft. Fatiguing, performed immediately following nustep interval training to minimize rest and maintain therapeutic effect. Pt requires rest break following intervention    Not performed today: Isometric RLE knee flexion 10x 5 sec holds Prone R hip ext. With knee flexeed 90 deg 2x10  2.5# weights donned, standing at support surface: Hip abd x8 each LE Hip ext x8 each LE - limited due to back pain  Heel  raises 2x15 B   Leg press 40# 2x15 BLE    PATIENT EDUCATION:  Education details:  Pt educated throughout session about proper posture and technique with exercises. Improved exercise technique, movement at target joints, use of target muscles after min to mod verbal, visual, tactile cues.  Person educated: Patient Education method: Explanation, Demonstration, and Verbal cues Education comprehension: verbalized understanding, returned demonstration, verbal cues required, and needs further education   HOME EXERCISE PROGRAM:  No updates today. Pt to continue HEP as previously given: Continue as given from home health PT Reinforced importance of ROM including quad sets and LAQ/knee flexion;   ASSESSMENT:  CLINICAL IMPRESSION: Pt tolerated treatment fair today; CV interventions progressed however some strengthening exercises regressed due to c/o pain with performance at last session. Improved c/o tightness, TrP, and antalgic gait post manual therapy and HS stretches. Increased mm tone along biceps femoris and lateral gastroc head  may be due to secondary to MS and weak glutes. Tolerated increase in NuStep interval times and did not demonstrate foot drag with subsequent ambulation. Educated to notify PCP/neurologist with progression of foot pain; pt verbalized understanding. Pt would benefit from further skilled therapy to address remaining deficits in order to improve pain, balance, endurance, strength and mobility.     OBJECTIVE IMPAIRMENTS Abnormal gait, cardiopulmonary status limiting activity, decreased activity tolerance, decreased balance, decreased endurance, decreased mobility, difficulty walking, decreased ROM, decreased strength, hypomobility, impaired flexibility, and pain.   ACTIVITY LIMITATIONS bending, standing, squatting, stairs, transfers, and locomotion level  PARTICIPATION LIMITATIONS: cleaning, laundry, shopping, community activity, occupation, yard work, and church  PERSONAL FACTORS Age, Fitness, and 3+ comorbidities: MS, Sleep apnea, 2014 brain tumor, history of LE cellulitis, intermittent fluid build up/swelling in LE, DJD, HTN, Thrombocytopenia;   are also affecting patient's functional outcome.   REHAB POTENTIAL: Good  CLINICAL DECISION MAKING: Stable/uncomplicated  EVALUATION COMPLEXITY: Low   GOALS: Goals reviewed with patient? Yes  SHORT TERM GOALS: Target date: 01/23/2022  Patient will be adherent to HEP at least 3x a week to improve functional strength and balance for better safety at home. Baseline: Doing some exercise from home health 8/22: yes; 9/19: feels confident/indep but states, "not doing as much as I should" (previously met) Goal status: MET  2.  Patient will improve RLE knee extension to 0 to improve knee ROM for functional tasks such as standing/walking.  Baseline: RLE: -5 degrees in supine 8/22: neutral  Goal status: MET   LONG TERM GOALS: Target date: 05/21/2022  Patient will increase 10 meter walk test to >1.74ms as to improve gait speed for better community  ambulation and to reduce fall risk. Baseline: 0.98 m/s 8/22: 7.72 seconds =1.29 m/s  Goal status: MET  2.  Patient will tolerate 5 seconds of single leg stance without loss of balance to improve ability to get in and out of shower safely. Baseline: RLE: 3-5 sec inconsistent 8/22: 5 seconds Goal status: MET  3.   Patient will be independent with ascend/descend 12 steps using single UE in step over step pattern without LOB to safely get up/down stairs in home. Baseline: Not formally assessed, negotiates one step at a time; limits steps when possible 8/22: reciprocal ascending; step to descending; 9/19:able to perform recip pattern ascending/descending  Goal status: Met  4.  Pt will improve RLE knee flexion to >115 degrees for functional ROM for transfers and step negotiation;  Baseline: RLE: 108 degrees 8/22: 125  Goal status: MET  5.  Patient (> 60 years  old) will complete five times sit to stand test in < 15 seconds indicating an increased LE strength and improved balance. Baseline: 19.95 sec 8/22: 15.65 seconds; 9/19: 14.5 sec hands-free Goal status: Met  6.  Patient will improve FOTO score by 5% to indicate improved functional mobility with ADLs.  Baseline: 46% 8/22: 55%; 9/19: 57% Goal status: Met   7. Patient will increase six minute walk test distance to >1300 for improved functional capacity and gait ability. Baseline: 9/19: 1190 ft with one rest break due to fatigue, rates completing test as difficult Goal status: NEW    8. Patient will report a worst R LE pain as no greater than 1/10 on VAS in order to improve tolerance with ADLs and reduced symptoms with activities.  Baseline: 9/19: pain still greater than 3-4/10.  Goal status: NEW  PLAN: PT FREQUENCY: 2x/week  PT DURATION: 12 weeks  PLANNED INTERVENTIONS: Therapeutic exercises, Therapeutic activity, Neuromuscular re-education, Balance training, Gait training, Patient/Family education, Self Care, Joint mobilization,  Stair training, Dry Needling, Cryotherapy, Moist heat, scar mobilization, Taping, and Manual therapy  PLAN FOR NEXT SESSION: circuit/interval training, work on ROM/strengthening, dry needling, endurance, continue plan    Herminio Commons, PT, DPT 10:08 AM,03/21/22 Physical Therapist - [facility]

## 2022-03-28 ENCOUNTER — Ambulatory Visit: Payer: Medicare Other

## 2022-03-28 DIAGNOSIS — R262 Difficulty in walking, not elsewhere classified: Secondary | ICD-10-CM

## 2022-03-28 DIAGNOSIS — R2689 Other abnormalities of gait and mobility: Secondary | ICD-10-CM

## 2022-03-28 DIAGNOSIS — M6281 Muscle weakness (generalized): Secondary | ICD-10-CM | POA: Diagnosis not present

## 2022-03-28 DIAGNOSIS — M5459 Other low back pain: Secondary | ICD-10-CM

## 2022-03-28 DIAGNOSIS — M25561 Pain in right knee: Secondary | ICD-10-CM

## 2022-03-28 DIAGNOSIS — R278 Other lack of coordination: Secondary | ICD-10-CM

## 2022-03-28 DIAGNOSIS — R2681 Unsteadiness on feet: Secondary | ICD-10-CM

## 2022-03-28 DIAGNOSIS — R269 Unspecified abnormalities of gait and mobility: Secondary | ICD-10-CM

## 2022-03-28 DIAGNOSIS — M25661 Stiffness of right knee, not elsewhere classified: Secondary | ICD-10-CM

## 2022-03-28 NOTE — Therapy (Signed)
OUTPATIENT PHYSICAL THERAPY LOWER EXTREMITY Treatment  Patient Name: Leslie Smith MRN: [id number] DOB:Aug 12, 1950, 71 y.o., female Today's Date: 03/28/2022   PT End of Session - 03/28/22 0805     Visit Number 17    Number of Visits 38    Date for PT Re-Evaluation 05/21/22    Authorization Type Medicare Parts A & B    Authorization Time Period 12/25/21-02/20/22    Progress Note Due on Visit 95    PT Start Time 0805    PT Stop Time 0850    PT Time Calculation (min) 45 min    Equipment Utilized During Treatment Gait belt    Activity Tolerance Patient tolerated treatment well    Behavior During Therapy [facility] for tasks assessed/performed                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[place]) 09/15/2014   Bladder incontinence    Cancer ([place]) 2014   brain tumor, resected   Cellulitis and abscess of leg 2019   2 episodes this year   Complication of anesthesia    DJD (degenerative joint disease) of knee 2018   Fall    Fatigue    History of blood transfusion    Hypertension    MS (multiple sclerosis) ([place])    Myelitis ([place])    Neuromuscular disorder ([place]) 2001   multiple sclerosis diagnosed by MRI   PONV (postoperative nausea and vomiting)    severe   Thrombocytopenia ([place])    Past Surgical History:  Procedure Laterality Date   ABDOMINAL HYSTERECTOMY  2011   required wound vac for 6 weeks. ovaries had grown attached to her back bone   BRAIN SURGERY  2015   tumor resection-denies seizures   BREAST SURGERY Left 2014   biopsy. negative   HARDWARE REMOVAL Left 05/05/2018   Procedure: HARDWARE REMOVAL- LEFT ANKLE;  Surgeon: Hessie Knows, MD;  Location: [facility];  Service: Orthopedics;  Laterality: Left;   I & D EXTREMITY Left 08/25/2015   Procedure: IRRIGATION AND DEBRIDEMENT THUMB;  Surgeon: Iran Planas, MD;  Location: [facility];  Service: Orthopedics;  Laterality: Left;   ORIF ANKLE FRACTURE Left 02/20/2018   Procedure: OPEN REDUCTION INTERNAL  FIXATION (ORIF) ANKLE FRACTURE;  Surgeon: Hessie Knows, MD;  Location: [facility];  Service: Orthopedics;  Laterality: Left;   TOTAL KNEE ARTHROPLASTY Right 11/21/2021   Procedure: TOTAL KNEE ARTHROPLASTY;  Surgeon: Lovell Sheehan, MD;  Location: [facility];  Service: Orthopedics;  Laterality: Right;   Patient Active Problem List   Diagnosis Date Noted   S/P TKR (total knee replacement) using cement, right 11/21/2021   Atherosclerosis of abdominal aorta ([place]) 01/10/2021   Coronary artery disease involving native coronary artery of native heart 01/10/2021   OSA (obstructive sleep apnea) 04/19/2020   Statin intolerance 01/14/2020   S/P ORIF (open reduction internal fixation) fracture 02/20/2018   DJD (degenerative joint disease) of knee 01/02/2017   Advanced care planning/counseling discussion 01/02/2017   Thrombocytopenia ([place]) 10/30/2015   Multiple sclerosis ([place])    Essential hypertension    Hyperlipidemia    Benign neoplasm of brain ([place]) 04/30/2013   Cerebral meningioma ([place]) 04/29/2013   Malignant hypertension 04/29/2013   Obesity 04/15/2013    PCP: Fulton Reek, MD  REFERRING PROVIDER: Kurtis Bushman MD  REFERRING DIAG: R TKA, 11/21/21  THERAPY DIAG:  Difficulty in walking, not elsewhere classified  Muscle weakness (generalized)  Stiffness of right knee, not elsewhere classified  Other  low back pain  Chronic pain of right knee  Unsteadiness on feet  Other abnormalities of gait and mobility  Other lack of coordination  Abnormality of gait and mobility  Rationale for Evaluation and Treatment Rehabilitation  ONSET DATE: 11/21/21  SUBJECTIVE:   SUBJECTIVE STATEMENT:  Pt notes that she has been cleaning the house a little bit at a time.  She states her pain isn't too bad this morning but hasn't been up long enough to notice.    Are you having pain? Yes: NPRS scale: 4/10 Pain location: knees Pain description: stress test   PERTINENT HISTORY:  71 yo Female s/p  R TKA on 11/21/21. Patient reports having home health PT for 2-3 weeks. She reports doing well. She presents to therapy without AD. She reports not needing AD at home. She has been sleeping with CPAP and cryocuff on RLE- but has been sleeping well; PMH significant: MS, Sleep apnea, 2014 brain tumor, history of LE cellulitis, intermittent fluid build up/swelling in LE, DJD, HTN, Thrombocytopenia; Pt is followed by PCP regarding LE swelling; Pt  back on BP medications.    PRECAUTIONS: Fall Risk  WEIGHT BEARING RESTRICTIONS No  FALLS:  Has patient fallen in last 6 months? Yes. Number of falls 1  LIVING ENVIRONMENT: Lives with: lives alone Lives in: House/apartment Stairs: Yes: Internal: 13 steps; on right going up and External: 3 steps; has ramp Has following equipment at home: Single point cane, Walker - 2 wheeled, Environmental consultant - 4 wheeled, Crutches, bed side commode, and Grab bars  OCCUPATION: Retired Emergency planning/management officer  PLOF: Independent with basic ADLs, Independent with gait, and Independent with transfers   [place] transfers, increase endurance/activity tolerance, Improve strength Pt would like to be able to clean her house and be more active at home.     TODAY'S TREATMENT: 03/28/2022  TherEx:  Seated HS stretch bil, 2x30s - unable to tolerate IV bias secondary to foot pain STS x15 from plinth SLS, 3x20s, UUE support on plinth - increased ankle righting reaction strategies on LLE. Cues for hip stabilization on L due to hip drop Standing hip ABD/ext x10, B UE support while standing on airex pad for ankle righting response Standing calf raises on airex pad, B UE support while standing on airex pad for added instability and ankle righting response Leg press 55# x10 B LE; 70# x10 B LE  Nustep - interval training - PT adjusts intensity throughout to maintain therapeutic range and as appropriate for pt response to intensity Seat 9, UE 5 Lvl 1 x 10mn warm-up  Lvl 3 x 2166m  Lvl 5  x 66m76mLvl 3 x 66mi54mvl 1 x 1 min cool-down  Amb for endurance 4x150 ft, pt noting decreased fatigue with ambulation today, noting it's just a good day today.      PATIENT EDUCATION:  Education details:  Pt educated throughout session about proper posture and technique with exercises. Improved exercise technique, movement at target joints, use of target muscles after min to mod verbal, visual, tactile cues.  Person educated: Patient Education method: Explanation, Demonstration, and Verbal cues Education comprehension: verbalized understanding, returned demonstration, verbal cues required, and needs further education   HOME EXERCISE PROGRAM:  No updates today. Pt to continue HEP as previously given: Continue as given from home health PT Reinforced importance of ROM including quad sets and LAQ/knee flexion;   ASSESSMENT:  CLINICAL IMPRESSION: Pt responded well to exercises today and was able to ambulate further without fatigue.  Pt notes that she is feeling better and has also been able to improve her activity levels at home lately as well.  Pt encouraged to continue with exercises at home and [place] and such to improve her endurance and keep gains that she has made thus far.  Pt ambulate with less foot drag on the L LE as well following the NuStep.   Pt will continue to benefit from skilled therapy to address remaining deficits in order to improve overall QoL and return to PLOF.       OBJECTIVE IMPAIRMENTS Abnormal gait, cardiopulmonary status limiting activity, decreased activity tolerance, decreased balance, decreased endurance, decreased mobility, difficulty walking, decreased ROM, decreased strength, hypomobility, impaired flexibility, and pain.   ACTIVITY LIMITATIONS bending, standing, squatting, stairs, transfers, and locomotion level  PARTICIPATION LIMITATIONS: cleaning, laundry, shopping, community activity, occupation, yard work, and  church  PERSONAL FACTORS Age, Fitness, and 3+ comorbidities: MS, Sleep apnea, 2014 brain tumor, history of LE cellulitis, intermittent fluid build up/swelling in LE, DJD, HTN, Thrombocytopenia;   are also affecting patient's functional outcome.   REHAB POTENTIAL: Good  CLINICAL DECISION MAKING: Stable/uncomplicated  EVALUATION COMPLEXITY: Low   GOALS: Goals reviewed with patient? Yes  SHORT TERM GOALS: Target date: 01/23/2022  Patient will be adherent to HEP at least 3x a week to improve functional strength and balance for better safety at home. Baseline: Doing some exercise from home health 8/22: yes; 9/19: feels confident/indep but states, "not doing as much as I should" (previously met) Goal status: MET  2.  Patient will improve RLE knee extension to 0 to improve knee ROM for functional tasks such as standing/walking.  Baseline: RLE: -5 degrees in supine 8/22: neutral  Goal status: MET   LONG TERM GOALS: Target date: 05/21/2022  Patient will increase 10 meter walk test to >1.37ms as to improve gait speed for better community ambulation and to reduce fall risk. Baseline: 0.98 m/s 8/22: 7.72 seconds =1.29 m/s  Goal status: MET  2.  Patient will tolerate 5 seconds of single leg stance without loss of balance to improve ability to get in and out of shower safely. Baseline: RLE: 3-5 sec inconsistent 8/22: 5 seconds Goal status: MET  3.   Patient will be independent with ascend/descend 12 steps using single UE in step over step pattern without LOB to safely get up/down stairs in home. Baseline: Not formally assessed, negotiates one step at a time; limits steps when possible 8/22: reciprocal ascending; step to descending; 9/19:able to perform recip pattern ascending/descending  Goal status: Met  4.  Pt will improve RLE knee flexion to >115 degrees for functional ROM for transfers and step negotiation;  Baseline: RLE: 108 degrees 8/22: 125  Goal status: MET  5.  Patient (> 672 years old) will complete five times sit to stand test in < 15 seconds indicating an increased LE strength and improved balance. Baseline: 19.95 sec 8/22: 15.65 seconds; 9/19: 14.5 sec hands-free Goal status: Met  6.  Patient will improve FOTO score by 5% to indicate improved functional mobility with ADLs.  Baseline: 46% 8/22: 55%; 9/19: 57% Goal status: Met   7. Patient will increase six minute walk test distance to >1300 for improved functional capacity and gait ability. Baseline: 9/19: 1190 ft with one rest break due to fatigue, rates completing test as difficult Goal status: NEW    8. Patient will report a worst R LE pain as no greater than 1/10 on VAS  in order to improve tolerance with ADLs and reduced symptoms with activities.  Baseline: 9/19: pain still greater than 3-4/10.  Goal status: NEW  PLAN: PT FREQUENCY: 2x/week  PT DURATION: 12 weeks  PLANNED INTERVENTIONS: Therapeutic exercises, Therapeutic activity, Neuromuscular re-education, Balance training, Gait training, Patient/Family education, Self Care, Joint mobilization, Stair training, Dry Needling, Cryotherapy, Moist heat, scar mobilization, Taping, and Manual therapy  PLAN FOR NEXT SESSION: circuit/interval training, work on ROM/strengthening, dry needling, endurance, continue plan    Gwenlyn Saran, PT, DPT Physical Therapist- [facility]  03/28/22, 8:05 AM

## 2022-04-02 ENCOUNTER — Encounter: Payer: Medicare Other | Admitting: Physical Therapy

## 2022-04-04 ENCOUNTER — Ambulatory Visit: Payer: Medicare Other

## 2022-04-09 ENCOUNTER — Encounter: Payer: Medicare Other | Admitting: Physical Therapy

## 2022-04-09 ENCOUNTER — Ambulatory Visit: Payer: Medicare Other

## 2022-04-09 DIAGNOSIS — M25661 Stiffness of right knee, not elsewhere classified: Secondary | ICD-10-CM

## 2022-04-09 DIAGNOSIS — M6281 Muscle weakness (generalized): Secondary | ICD-10-CM | POA: Diagnosis not present

## 2022-04-09 DIAGNOSIS — R262 Difficulty in walking, not elsewhere classified: Secondary | ICD-10-CM

## 2022-04-09 DIAGNOSIS — M5459 Other low back pain: Secondary | ICD-10-CM

## 2022-04-09 NOTE — Therapy (Signed)
OUTPATIENT PHYSICAL THERAPY LOWER EXTREMITY Treatment  Patient Name: Leslie Smith MRN: [id number] DOB:14-Sep-1950, 71 y.o., female Today's Date: 04/09/2022   PT End of Session - 04/09/22 0808     Visit Number 18    Number of Visits 38    Date for PT Re-Evaluation 05/21/22    Authorization Type Medicare Parts A & B    Authorization Time Period 12/25/21-02/20/22    Progress Note Due on Visit 51    PT Start Time 0806    PT Stop Time 0845    PT Time Calculation (min) 39 min    Equipment Utilized During Treatment Gait belt    Activity Tolerance Patient tolerated treatment well    Behavior During Therapy [facility] for tasks assessed/performed                 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[place]) 09/15/2014   Bladder incontinence    Cancer ([place]) 2014   brain tumor, resected   Cellulitis and abscess of leg 2019   2 episodes this year   Complication of anesthesia    DJD (degenerative joint disease) of knee 2018   Fall    Fatigue    History of blood transfusion    Hypertension    MS (multiple sclerosis) ([place])    Myelitis ([place])    Neuromuscular disorder ([place]) 2001   multiple sclerosis diagnosed by MRI   PONV (postoperative nausea and vomiting)    severe   Thrombocytopenia ([place])    Past Surgical History:  Procedure Laterality Date   ABDOMINAL HYSTERECTOMY  2011   required wound vac for 6 weeks. ovaries had grown attached to her back bone   BRAIN SURGERY  2015   tumor resection-denies seizures   BREAST SURGERY Left 2014   biopsy. negative   HARDWARE REMOVAL Left 05/05/2018   Procedure: HARDWARE REMOVAL- LEFT ANKLE;  Surgeon: Hessie Knows, MD;  Location: [facility];  Service: Orthopedics;  Laterality: Left;   I & D EXTREMITY Left 08/25/2015   Procedure: IRRIGATION AND DEBRIDEMENT THUMB;  Surgeon: Iran Planas, MD;  Location: [facility];  Service: Orthopedics;  Laterality: Left;   ORIF ANKLE FRACTURE Left 02/20/2018   Procedure: OPEN REDUCTION INTERNAL  FIXATION (ORIF) ANKLE FRACTURE;  Surgeon: Hessie Knows, MD;  Location: [facility];  Service: Orthopedics;  Laterality: Left;   TOTAL KNEE ARTHROPLASTY Right 11/21/2021   Procedure: TOTAL KNEE ARTHROPLASTY;  Surgeon: Lovell Sheehan, MD;  Location: [facility];  Service: Orthopedics;  Laterality: Right;   Patient Active Problem List   Diagnosis Date Noted   S/P TKR (total knee replacement) using cement, right 11/21/2021   Atherosclerosis of abdominal aorta ([place]) 01/10/2021   Coronary artery disease involving native coronary artery of native heart 01/10/2021   OSA (obstructive sleep apnea) 04/19/2020   Statin intolerance 01/14/2020   S/P ORIF (open reduction internal fixation) fracture 02/20/2018   DJD (degenerative joint disease) of knee 01/02/2017   Advanced care planning/counseling discussion 01/02/2017   Thrombocytopenia ([place]) 10/30/2015   Multiple sclerosis ([place])    Essential hypertension    Hyperlipidemia    Benign neoplasm of brain ([place]) 04/30/2013   Cerebral meningioma ([place]) 04/29/2013   Malignant hypertension 04/29/2013   Obesity 04/15/2013    PCP: Fulton Reek, MD  REFERRING PROVIDER: Kurtis Bushman MD  REFERRING DIAG: R TKA, 11/21/21  THERAPY DIAG:  Difficulty in walking, not elsewhere classified  Muscle weakness (generalized)  Stiffness of right knee, not elsewhere classified  Other low back pain  Rationale for Evaluation and Treatment Rehabilitation  ONSET DATE: 11/21/21  SUBJECTIVE:   SUBJECTIVE STATEMENT: Patient reports she has been busy cleaning her house a lot lately.   Are you having pain? Yes: NPRS scale: 4/10 Pain location: knees Pain description: stress test   PERTINENT HISTORY:  71 yo Female s/p R TKA on 11/21/21. Patient reports having home health PT for 2-3 weeks. She reports doing well. She presents to therapy without AD. She reports not needing AD at home. She has been sleeping with CPAP and cryocuff on RLE- but has been sleeping well; PMH  significant: MS, Sleep apnea, 2014 brain tumor, history of LE cellulitis, intermittent fluid build up/swelling in LE, DJD, HTN, Thrombocytopenia; Pt is followed by PCP regarding LE swelling; Pt  back on BP medications.    PRECAUTIONS: Fall Risk  WEIGHT BEARING RESTRICTIONS No  FALLS:  Has patient fallen in last 6 months? Yes. Number of falls 1  LIVING ENVIRONMENT: Lives with: lives alone Lives in: House/apartment Stairs: Yes: Internal: 13 steps; on right going up and External: 3 steps; has ramp Has following equipment at home: Single point cane, Walker - 2 wheeled, Environmental consultant - 4 wheeled, Crutches, bed side commode, and Grab bars  OCCUPATION: Retired Emergency planning/management officer  PLOF: Independent with basic ADLs, Independent with gait, and Independent with transfers   [place] transfers, increase endurance/activity tolerance, Improve strength Pt would like to be able to clean her house and be more active at home.     TODAY'S TREATMENT: 04/09/2022  TherEx:  Seated HS stretch bil, 2x30s STS x15 from raised chair with airex pad  Standing with # 4lb ankle weight: CGA for stability  -Hip extension with b upper extremity support, cueing for neutral hip alignment, upright posture for optimal muscle recruitment, and sequencing, 10x each LE,  -Hip abduction with b upper extremity support, cueing for neutral foot alignment for correct muscle activation, 10x each LE -Hip flexion with b upper extremity support, cueing for body mechanics, speed of muscle recruitment for optimal strengthening and stabilization 15x each LE -Hamstring curl with b upper extremity support, cueing for knee alignment for recruitment of hamstring musculature, 10x each LE -heel raise 20x   Seated with # 5x ankle weights  -Seated marches with upright posture, back away from back of chair for abdominal/trunk activation/stabilization, 10x each LE -Seated LAQ with 3 second holds, 10x each LE, cueing for muscle  activation and sequencing for neutral alignment -Seated IR/ER with cueing for stabilizing knee placement with lateral foot movement for optimal muscle recruitment, 10x each LE  BTB hamstring curl 15x each LE Adduction ball squeeze 15x Adduction ball squeeze with LAQ 10x      PATIENT EDUCATION:  Education details:  Pt educated throughout session about proper posture and technique with exercises. Improved exercise technique, movement at target joints, use of target muscles after min to mod verbal, visual, tactile cues.  Person educated: Patient Education method: Explanation, Demonstration, and Verbal cues Education comprehension: verbalized understanding, returned demonstration, verbal cues required, and needs further education   HOME EXERCISE PROGRAM:  No updates today. Pt to continue HEP as previously given: Continue as given from home health PT Reinforced importance of ROM including quad sets and LAQ/knee flexion;   ASSESSMENT:  CLINICAL IMPRESSION: Pt aware that she has testing coming up soon. Does have pain with sit to stands requiring raised surface to reduce pain. She does fatigue with use of increased weights on LE's.  She  tolerates progressive strengthening exercises well with fatigue. Pt will continue to benefit from skilled therapy to address remaining deficits in order to improve overall QoL and return to PLOF.       OBJECTIVE IMPAIRMENTS Abnormal gait, cardiopulmonary status limiting activity, decreased activity tolerance, decreased balance, decreased endurance, decreased mobility, difficulty walking, decreased ROM, decreased strength, hypomobility, impaired flexibility, and pain.   ACTIVITY LIMITATIONS bending, standing, squatting, stairs, transfers, and locomotion level  PARTICIPATION LIMITATIONS: cleaning, laundry, shopping, community activity, occupation, yard work, and church  PERSONAL FACTORS Age, Fitness, and 3+ comorbidities: MS, Sleep apnea, 2014 brain tumor,  history of LE cellulitis, intermittent fluid build up/swelling in LE, DJD, HTN, Thrombocytopenia;   are also affecting patient's functional outcome.   REHAB POTENTIAL: Good  CLINICAL DECISION MAKING: Stable/uncomplicated  EVALUATION COMPLEXITY: Low   GOALS: Goals reviewed with patient? Yes  SHORT TERM GOALS: Target date: 01/23/2022  Patient will be adherent to HEP at least 3x a week to improve functional strength and balance for better safety at home. Baseline: Doing some exercise from home health 8/22: yes; 9/19: feels confident/indep but states, "not doing as much as I should" (previously met) Goal status: MET  2.  Patient will improve RLE knee extension to 0 to improve knee ROM for functional tasks such as standing/walking.  Baseline: RLE: -5 degrees in supine 8/22: neutral  Goal status: MET   LONG TERM GOALS: Target date: 05/21/2022  Patient will increase 10 meter walk test to >1.72ms as to improve gait speed for better community ambulation and to reduce fall risk. Baseline: 0.98 m/s 8/22: 7.72 seconds =1.29 m/s  Goal status: MET  2.  Patient will tolerate 5 seconds of single leg stance without loss of balance to improve ability to get in and out of shower safely. Baseline: RLE: 3-5 sec inconsistent 8/22: 5 seconds Goal status: MET  3.   Patient will be independent with ascend/descend 12 steps using single UE in step over step pattern without LOB to safely get up/down stairs in home. Baseline: Not formally assessed, negotiates one step at a time; limits steps when possible 8/22: reciprocal ascending; step to descending; 9/19:able to perform recip pattern ascending/descending  Goal status: Met  4.  Pt will improve RLE knee flexion to >115 degrees for functional ROM for transfers and step negotiation;  Baseline: RLE: 108 degrees 8/22: 125  Goal status: MET  5.  Patient (> 636years old) will complete five times sit to stand test in < 15 seconds indicating an increased LE  strength and improved balance. Baseline: 19.95 sec 8/22: 15.65 seconds; 9/19: 14.5 sec hands-free Goal status: Met  6.  Patient will improve FOTO score by 5% to indicate improved functional mobility with ADLs.  Baseline: 46% 8/22: 55%; 9/19: 57% Goal status: Met   7. Patient will increase six minute walk test distance to >1300 for improved functional capacity and gait ability. Baseline: 9/19: 1190 ft with one rest break due to fatigue, rates completing test as difficult Goal status: NEW    8. Patient will report a worst R LE pain as no greater than 1/10 on VAS in order to improve tolerance with ADLs and reduced symptoms with activities.  Baseline: 9/19: pain still greater than 3-4/10.  Goal status: NEW  PLAN: PT FREQUENCY: 2x/week  PT DURATION: 12 weeks  PLANNED INTERVENTIONS: Therapeutic exercises, Therapeutic activity, Neuromuscular re-education, Balance training, Gait training, Patient/Family education, Self Care, Joint mobilization, Stair training, Dry Needling, Cryotherapy, Moist heat, scar mobilization, Taping, and Manual  therapy  PLAN FOR NEXT SESSION: circuit/interval training, work on ROM/strengthening, dry needling, endurance, continue plan    Janna Arch PT  Physical Therapist- [facility]  04/09/22, 9:48 AM

## 2022-04-09 NOTE — Therapy (Incomplete)
OUTPATIENT PHYSICAL THERAPY LOWER EXTREMITY Treatment  Patient Name: Leslie Smith MRN: [id number] DOB:1951-01-15, 71 y.o., female Today's Date: 04/11/2022         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[place]) 09/15/2014   Bladder incontinence    Cancer ([place]) 2014   brain tumor, resected   Cellulitis and abscess of leg 2019   2 episodes this year   Complication of anesthesia    DJD (degenerative joint disease) of knee 2018   Fall    Fatigue    History of blood transfusion    Hypertension    MS (multiple sclerosis) ([place])    Myelitis ([place])    Neuromuscular disorder ([place]) 2001   multiple sclerosis diagnosed by MRI   PONV (postoperative nausea and vomiting)    severe   Thrombocytopenia ([place])    Past Surgical History:  Procedure Laterality Date   ABDOMINAL HYSTERECTOMY  2011   required wound vac for 6 weeks. ovaries had grown attached to her back bone   BRAIN SURGERY  2015   tumor resection-denies seizures   BREAST SURGERY Left 2014   biopsy. negative   HARDWARE REMOVAL Left 05/05/2018   Procedure: HARDWARE REMOVAL- LEFT ANKLE;  Surgeon: Hessie Knows, MD;  Location: [facility];  Service: Orthopedics;  Laterality: Left;   I & D EXTREMITY Left 08/25/2015   Procedure: IRRIGATION AND DEBRIDEMENT THUMB;  Surgeon: Iran Planas, MD;  Location: [place];  Service: Orthopedics;  Laterality: Left;   ORIF ANKLE FRACTURE Left 02/20/2018   Procedure: OPEN REDUCTION INTERNAL FIXATION (ORIF) ANKLE FRACTURE;  Surgeon: Hessie Knows, MD;  Location: [facility];  Service: Orthopedics;  Laterality: Left;   TOTAL KNEE ARTHROPLASTY Right 11/21/2021   Procedure: TOTAL KNEE ARTHROPLASTY;  Surgeon: Lovell Sheehan, MD;  Location: [facility];  Service: Orthopedics;  Laterality: Right;   Patient Active Problem List   Diagnosis Date Noted   S/P TKR (total knee replacement) using cement, right 11/21/2021   Atherosclerosis of abdominal aorta ([place]) 01/10/2021   Coronary artery disease  involving native coronary artery of native heart 01/10/2021   OSA (obstructive sleep apnea) 04/19/2020   Statin intolerance 01/14/2020   S/P ORIF (open reduction internal fixation) fracture 02/20/2018   DJD (degenerative joint disease) of knee 01/02/2017   Advanced care planning/counseling discussion 01/02/2017   Thrombocytopenia ([place]) 10/30/2015   Multiple sclerosis ([place])    Essential hypertension    Hyperlipidemia    Benign neoplasm of brain ([place]) 04/30/2013   Cerebral meningioma ([place]) 04/29/2013   Malignant hypertension 04/29/2013   Obesity 04/15/2013    PCP: Fulton Reek, MD  REFERRING PROVIDER: Kurtis Bushman MD  REFERRING DIAG: R TKA, 11/21/21  THERAPY DIAG:  No diagnosis found.  Rationale for Evaluation and Treatment Rehabilitation  ONSET DATE: 11/21/21  SUBJECTIVE:   SUBJECTIVE STATEMENT: Patient reports she has been busy cleaning her house a lot lately.   Are you having pain? Yes: NPRS scale: 4/10 Pain location: knees Pain description: stress test   PERTINENT HISTORY:  71 yo Female s/p R TKA on 11/21/21. Patient reports having home health PT for 2-3 weeks. She reports doing well. She presents to therapy without AD. She reports not needing AD at home. She has been sleeping with CPAP and cryocuff on RLE- but has been sleeping well; PMH significant: MS, Sleep apnea, 2014 brain tumor, history of LE cellulitis, intermittent fluid build up/swelling in LE, DJD, HTN, Thrombocytopenia; Pt is followed by PCP regarding LE swelling; Pt  back  on BP medications.    PRECAUTIONS: Fall Risk  WEIGHT BEARING RESTRICTIONS No  FALLS:  Has patient fallen in last 6 months? Yes. Number of falls 1  LIVING ENVIRONMENT: Lives with: lives alone Lives in: House/apartment Stairs: Yes: Internal: 13 steps; on right going up and External: 3 steps; has ramp Has following equipment at home: Single point cane, Walker - 2 wheeled, Environmental consultant - 4 wheeled, Crutches, bed side commode, and Grab  bars  OCCUPATION: Retired Emergency planning/management officer  PLOF: Independent with basic ADLs, Independent with gait, and Independent with transfers   [place] transfers, increase endurance/activity tolerance, Improve strength Pt would like to be able to clean her house and be more active at home.     TODAY'S TREATMENT:   TherEx:  Seated HS stretch bil, 2x30s STS x15 from raised chair with airex pad  Standing with # 4lb ankle weight: CGA for stability  -Hip extension with b upper extremity support, cueing for neutral hip alignment, upright posture for optimal muscle recruitment, and sequencing, 10x each LE,  -Hip abduction with b upper extremity support, cueing for neutral foot alignment for correct muscle activation, 10x each LE -Hip flexion with b upper extremity support, cueing for body mechanics, speed of muscle recruitment for optimal strengthening and stabilization 15x each LE -Hamstring curl with b upper extremity support, cueing for knee alignment for recruitment of hamstring musculature, 10x each LE -heel raise 20x   Seated with # 5x ankle weights  -Seated marches with upright posture, back away from back of chair for abdominal/trunk activation/stabilization, 10x each LE -Seated LAQ with 3 second holds, 10x each LE, cueing for muscle activation and sequencing for neutral alignment -Seated IR/ER with cueing for stabilizing knee placement with lateral foot movement for optimal muscle recruitment, 10x each LE  BTB hamstring curl 15x each LE Adduction ball squeeze 15x Adduction ball squeeze with LAQ 10x      PATIENT EDUCATION:  Education details:  Pt educated throughout session about proper posture and technique with exercises. Improved exercise technique, movement at target joints, use of target muscles after min to mod verbal, visual, tactile cues.  Person educated: Patient Education method: Explanation, Demonstration, and Verbal cues Education comprehension: verbalized  understanding, returned demonstration, verbal cues required, and needs further education   HOME EXERCISE PROGRAM:  No updates today. Pt to continue HEP as previously given: Continue as given from home health PT Reinforced importance of ROM including quad sets and LAQ/knee flexion;   ASSESSMENT:  CLINICAL IMPRESSION: ***  Pt aware that she has testing coming up soon. Does have pain with sit to stands requiring raised surface to reduce pain. She does fatigue with use of increased weights on LE's.  She tolerates progressive strengthening exercises well with fatigue. Pt will continue to benefit from skilled therapy to address remaining deficits in order to improve overall QoL and return to PLOF.       OBJECTIVE IMPAIRMENTS Abnormal gait, cardiopulmonary status limiting activity, decreased activity tolerance, decreased balance, decreased endurance, decreased mobility, difficulty walking, decreased ROM, decreased strength, hypomobility, impaired flexibility, and pain.   ACTIVITY LIMITATIONS bending, standing, squatting, stairs, transfers, and locomotion level  PARTICIPATION LIMITATIONS: cleaning, laundry, shopping, community activity, occupation, yard work, and church  PERSONAL FACTORS Age, Fitness, and 3+ comorbidities: MS, Sleep apnea, 2014 brain tumor, history of LE cellulitis, intermittent fluid build up/swelling in LE, DJD, HTN, Thrombocytopenia;   are also affecting patient's functional outcome.   REHAB POTENTIAL: Good  CLINICAL DECISION MAKING: Stable/uncomplicated  EVALUATION COMPLEXITY: Low   GOALS: Goals reviewed with patient? Yes  SHORT TERM GOALS: Target date: 01/23/2022  Patient will be adherent to HEP at least 3x a week to improve functional strength and balance for better safety at home. Baseline: Doing some exercise from home health 8/22: yes; 9/19: feels confident/indep but states, "not doing as much as I should" (previously met) Goal status: MET  2.  Patient will  improve RLE knee extension to 0 to improve knee ROM for functional tasks such as standing/walking.  Baseline: RLE: -5 degrees in supine 8/22: neutral  Goal status: MET   LONG TERM GOALS: Target date: 05/21/2022  Patient will increase 10 meter walk test to >1.58ms as to improve gait speed for better community ambulation and to reduce fall risk. Baseline: 0.98 m/s 8/22: 7.72 seconds =1.29 m/s  Goal status: MET  2.  Patient will tolerate 5 seconds of single leg stance without loss of balance to improve ability to get in and out of shower safely. Baseline: RLE: 3-5 sec inconsistent 8/22: 5 seconds Goal status: MET  3.   Patient will be independent with ascend/descend 12 steps using single UE in step over step pattern without LOB to safely get up/down stairs in home. Baseline: Not formally assessed, negotiates one step at a time; limits steps when possible 8/22: reciprocal ascending; step to descending; 9/19:able to perform recip pattern ascending/descending  Goal status: Met  4.  Pt will improve RLE knee flexion to >115 degrees for functional ROM for transfers and step negotiation;  Baseline: RLE: 108 degrees 8/22: 125  Goal status: MET  5.  Patient (> 663years old) will complete five times sit to stand test in < 15 seconds indicating an increased LE strength and improved balance. Baseline: 19.95 sec 8/22: 15.65 seconds; 9/19: 14.5 sec hands-free Goal status: Met  6.  Patient will improve FOTO score by 5% to indicate improved functional mobility with ADLs.  Baseline: 46% 8/22: 55%; 9/19: 57% Goal status: Met   7. Patient will increase six minute walk test distance to >1300 for improved functional capacity and gait ability. Baseline: 9/19: 1190 ft with one rest break due to fatigue, rates completing test as difficult Goal status: NEW    8. Patient will report a worst R LE pain as no greater than 1/10 on VAS in order to improve tolerance with ADLs and reduced symptoms with  activities.  Baseline: 9/19: pain still greater than 3-4/10.  Goal status: NEW  PLAN: PT FREQUENCY: 2x/week  PT DURATION: 12 weeks  PLANNED INTERVENTIONS: Therapeutic exercises, Therapeutic activity, Neuromuscular re-education, Balance training, Gait training, Patient/Family education, Self Care, Joint mobilization, Stair training, Dry Needling, Cryotherapy, Moist heat, scar mobilization, Taping, and Manual therapy  PLAN FOR NEXT SESSION:   *** circuit/interval training, work on ROM/strengthening, dry needling, endurance, continue plan   JGwenlyn Saran PT, DPT Physical Therapist- [facility] 04/11/22, 8:07 AM

## 2022-04-11 ENCOUNTER — Ambulatory Visit: Payer: Medicare Other | Attending: Orthopedic Surgery

## 2022-04-11 DIAGNOSIS — M25661 Stiffness of right knee, not elsewhere classified: Secondary | ICD-10-CM | POA: Insufficient documentation

## 2022-04-11 DIAGNOSIS — M5459 Other low back pain: Secondary | ICD-10-CM | POA: Insufficient documentation

## 2022-04-11 DIAGNOSIS — R2689 Other abnormalities of gait and mobility: Secondary | ICD-10-CM | POA: Insufficient documentation

## 2022-04-11 DIAGNOSIS — R278 Other lack of coordination: Secondary | ICD-10-CM | POA: Insufficient documentation

## 2022-04-11 DIAGNOSIS — R269 Unspecified abnormalities of gait and mobility: Secondary | ICD-10-CM | POA: Insufficient documentation

## 2022-04-11 DIAGNOSIS — M25561 Pain in right knee: Secondary | ICD-10-CM | POA: Insufficient documentation

## 2022-04-11 DIAGNOSIS — R2681 Unsteadiness on feet: Secondary | ICD-10-CM | POA: Insufficient documentation

## 2022-04-11 DIAGNOSIS — R262 Difficulty in walking, not elsewhere classified: Secondary | ICD-10-CM | POA: Insufficient documentation

## 2022-04-11 DIAGNOSIS — M6281 Muscle weakness (generalized): Secondary | ICD-10-CM | POA: Insufficient documentation

## 2022-04-16 ENCOUNTER — Encounter: Payer: Medicare Other | Admitting: Physical Therapy

## 2022-04-18 ENCOUNTER — Ambulatory Visit: Payer: Medicare Other

## 2022-04-18 ENCOUNTER — Telehealth: Payer: Self-pay

## 2022-04-18 NOTE — Therapy (Incomplete)
OUTPATIENT PHYSICAL THERAPY LOWER EXTREMITY Treatment  Patient Name: Leslie Smith MRN: [id number] DOB:September 16, 1950, 71 y.o., female Today's Date: 04/18/2022         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[place]) 09/15/2014   Bladder incontinence    Cancer ([place]) 2014   brain tumor, resected   Cellulitis and abscess of leg 2019   2 episodes this year   Complication of anesthesia    DJD (degenerative joint disease) of knee 2018   Fall    Fatigue    History of blood transfusion    Hypertension    MS (multiple sclerosis) ([place])    Myelitis ([place])    Neuromuscular disorder ([place]) 2001   multiple sclerosis diagnosed by MRI   PONV (postoperative nausea and vomiting)    severe   Thrombocytopenia ([place])    Past Surgical History:  Procedure Laterality Date   ABDOMINAL HYSTERECTOMY  2011   required wound vac for 6 weeks. ovaries had grown attached to her back bone   BRAIN SURGERY  2015   tumor resection-denies seizures   BREAST SURGERY Left 2014   biopsy. negative   HARDWARE REMOVAL Left 05/05/2018   Procedure: HARDWARE REMOVAL- LEFT ANKLE;  Surgeon: Hessie Knows, MD;  Location: [facility];  Service: Orthopedics;  Laterality: Left;   I & D EXTREMITY Left 08/25/2015   Procedure: IRRIGATION AND DEBRIDEMENT THUMB;  Surgeon: Iran Planas, MD;  Location: [place];  Service: Orthopedics;  Laterality: Left;   ORIF ANKLE FRACTURE Left 02/20/2018   Procedure: OPEN REDUCTION INTERNAL FIXATION (ORIF) ANKLE FRACTURE;  Surgeon: Hessie Knows, MD;  Location: [facility];  Service: Orthopedics;  Laterality: Left;   TOTAL KNEE ARTHROPLASTY Right 11/21/2021   Procedure: TOTAL KNEE ARTHROPLASTY;  Surgeon: Lovell Sheehan, MD;  Location: [facility];  Service: Orthopedics;  Laterality: Right;   Patient Active Problem List   Diagnosis Date Noted   S/P TKR (total knee replacement) using cement, right 11/21/2021   Atherosclerosis of abdominal aorta ([place]) 01/10/2021   Coronary artery disease  involving native coronary artery of native heart 01/10/2021   OSA (obstructive sleep apnea) 04/19/2020   Statin intolerance 01/14/2020   S/P ORIF (open reduction internal fixation) fracture 02/20/2018   DJD (degenerative joint disease) of knee 01/02/2017   Advanced care planning/counseling discussion 01/02/2017   Thrombocytopenia ([place]) 10/30/2015   Multiple sclerosis ([place])    Essential hypertension    Hyperlipidemia    Benign neoplasm of brain ([place]) 04/30/2013   Cerebral meningioma ([place]) 04/29/2013   Malignant hypertension 04/29/2013   Obesity 04/15/2013    PCP: Fulton Reek, MD  REFERRING PROVIDER: Kurtis Bushman MD  REFERRING DIAG: R TKA, 11/21/21  THERAPY DIAG:  No diagnosis found.  Rationale for Evaluation and Treatment Rehabilitation  ONSET DATE: 11/21/21  SUBJECTIVE:   SUBJECTIVE STATEMENT:   ***   Are you having pain? Yes: NPRS scale: 4/10 Pain location: knees Pain description: stress test   PERTINENT HISTORY:  71 yo Female s/p R TKA on 11/21/21. Patient reports having home health PT for 2-3 weeks. She reports doing well. She presents to therapy without AD. She reports not needing AD at home. She has been sleeping with CPAP and cryocuff on RLE- but has been sleeping well; PMH significant: MS, Sleep apnea, 2014 brain tumor, history of LE cellulitis, intermittent fluid build up/swelling in LE, DJD, HTN, Thrombocytopenia; Pt is followed by PCP regarding LE swelling; Pt  back on BP medications.    PRECAUTIONS: Fall Risk  WEIGHT BEARING RESTRICTIONS No  FALLS:  Has patient fallen in last 6 months? Yes. Number of falls 1  LIVING ENVIRONMENT: Lives with: lives alone Lives in: House/apartment Stairs: Yes: Internal: 13 steps; on right going up and External: 3 steps; has ramp Has following equipment at home: Single point cane, Walker - 2 wheeled, Environmental consultant - 4 wheeled, Crutches, bed side commode, and Grab bars  OCCUPATION: Retired Emergency planning/management officer  PLOF: Independent  with basic ADLs, Independent with gait, and Independent with transfers   [place] transfers, increase endurance/activity tolerance, Improve strength Pt would like to be able to clean her house and be more active at home.     TODAY'S TREATMENT:   TherEx:  Seated HS stretch bil, 2x30s STS x15 from raised chair with airex pad  Standing with # 4lb ankle weight: CGA for stability  -Hip extension with b upper extremity support, cueing for neutral hip alignment, upright posture for optimal muscle recruitment, and sequencing, 10x each LE,  -Hip abduction with b upper extremity support, cueing for neutral foot alignment for correct muscle activation, 10x each LE -Hip flexion with b upper extremity support, cueing for body mechanics, speed of muscle recruitment for optimal strengthening and stabilization 15x each LE -Hamstring curl with b upper extremity support, cueing for knee alignment for recruitment of hamstring musculature, 10x each LE -heel raise 20x   Seated with # 5x ankle weights  -Seated marches with upright posture, back away from back of chair for abdominal/trunk activation/stabilization, 10x each LE -Seated LAQ with 3 second holds, 10x each LE, cueing for muscle activation and sequencing for neutral alignment -Seated IR/ER with cueing for stabilizing knee placement with lateral foot movement for optimal muscle recruitment, 10x each LE  BTB hamstring curl 15x each LE Adduction ball squeeze 15x Adduction ball squeeze with LAQ 10x      PATIENT EDUCATION:  Education details:  Pt educated throughout session about proper posture and technique with exercises. Improved exercise technique, movement at target joints, use of target muscles after min to mod verbal, visual, tactile cues.  Person educated: Patient Education method: Explanation, Demonstration, and Verbal cues Education comprehension: verbalized understanding, returned demonstration, verbal cues required,  and needs further education   HOME EXERCISE PROGRAM:  No updates today. Pt to continue HEP as previously given: Continue as given from home health PT Reinforced importance of ROM including quad sets and LAQ/knee flexion;   ASSESSMENT:  CLINICAL IMPRESSION: ***  Pt aware that she has testing coming up soon. Does have pain with sit to stands requiring raised surface to reduce pain. She does fatigue with use of increased weights on LE's.  She tolerates progressive strengthening exercises well with fatigue. Pt will continue to benefit from skilled therapy to address remaining deficits in order to improve overall QoL and return to PLOF.       OBJECTIVE IMPAIRMENTS Abnormal gait, cardiopulmonary status limiting activity, decreased activity tolerance, decreased balance, decreased endurance, decreased mobility, difficulty walking, decreased ROM, decreased strength, hypomobility, impaired flexibility, and pain.   ACTIVITY LIMITATIONS bending, standing, squatting, stairs, transfers, and locomotion level  PARTICIPATION LIMITATIONS: cleaning, laundry, shopping, community activity, occupation, yard work, and church  PERSONAL FACTORS Age, Fitness, and 3+ comorbidities: MS, Sleep apnea, 2014 brain tumor, history of LE cellulitis, intermittent fluid build up/swelling in LE, DJD, HTN, Thrombocytopenia;   are also affecting patient's functional outcome.   REHAB POTENTIAL: Good  CLINICAL DECISION MAKING: Stable/uncomplicated  EVALUATION COMPLEXITY: Low   GOALS: Goals reviewed with patient?  Yes  SHORT TERM GOALS: Target date: 01/23/2022  Patient will be adherent to HEP at least 3x a week to improve functional strength and balance for better safety at home. Baseline: Doing some exercise from home health 8/22: yes; 9/19: feels confident/indep but states, "not doing as much as I should" (previously met) Goal status: MET  2.  Patient will improve RLE knee extension to 0 to improve knee ROM for  functional tasks such as standing/walking.  Baseline: RLE: -5 degrees in supine 8/22: neutral  Goal status: MET   LONG TERM GOALS: Target date: 05/21/2022  Patient will increase 10 meter walk test to >1.46ms as to improve gait speed for better community ambulation and to reduce fall risk. Baseline: 0.98 m/s 8/22: 7.72 seconds =1.29 m/s  Goal status: MET  2.  Patient will tolerate 5 seconds of single leg stance without loss of balance to improve ability to get in and out of shower safely. Baseline: RLE: 3-5 sec inconsistent 8/22: 5 seconds Goal status: MET  3.   Patient will be independent with ascend/descend 12 steps using single UE in step over step pattern without LOB to safely get up/down stairs in home. Baseline: Not formally assessed, negotiates one step at a time; limits steps when possible 8/22: reciprocal ascending; step to descending; 9/19:able to perform recip pattern ascending/descending  Goal status: Met  4.  Pt will improve RLE knee flexion to >115 degrees for functional ROM for transfers and step negotiation;  Baseline: RLE: 108 degrees 8/22: 125  Goal status: MET  5.  Patient (> 658years old) will complete five times sit to stand test in < 15 seconds indicating an increased LE strength and improved balance. Baseline: 19.95 sec 8/22: 15.65 seconds; 9/19: 14.5 sec hands-free Goal status: Met  6.  Patient will improve FOTO score by 5% to indicate improved functional mobility with ADLs.  Baseline: 46% 8/22: 55%; 9/19: 57% Goal status: Met   7. Patient will increase six minute walk test distance to >1300 for improved functional capacity and gait ability. Baseline: 9/19: 1190 ft with one rest break due to fatigue, rates completing test as difficult Goal status: NEW    8. Patient will report a worst R LE pain as no greater than 1/10 on VAS in order to improve tolerance with ADLs and reduced symptoms with activities.  Baseline: 9/19: pain still greater than 3-4/10.  Goal  status: NEW  PLAN: PT FREQUENCY: 2x/week  PT DURATION: 12 weeks  PLANNED INTERVENTIONS: Therapeutic exercises, Therapeutic activity, Neuromuscular re-education, Balance training, Gait training, Patient/Family education, Self Care, Joint mobilization, Stair training, Dry Needling, Cryotherapy, Moist heat, scar mobilization, Taping, and Manual therapy  PLAN FOR NEXT SESSION:   *** circuit/interval training, work on ROM/strengthening, dry needling, endurance, continue plan   JGwenlyn Saran PT, DPT Physical Therapist- [facility] 04/18/22, 8:02 AM

## 2022-04-18 NOTE — Telephone Encounter (Signed)
Called and spoke to pt about missed visit.  Pt states she was unaware that she had scheduled visits and was under the impression she would need to schedule some.  Pt reminded of her next appointment on 11/16 @ 8AM.  Pt states she would write it down and would be there.    Gwenlyn Saran, PT, DPT Physical Therapist- [facility]  04/18/22, 9:19 AM

## 2022-04-23 ENCOUNTER — Encounter: Payer: Medicare Other | Admitting: Physical Therapy

## 2022-04-25 ENCOUNTER — Ambulatory Visit: Payer: Medicare Other

## 2022-04-25 DIAGNOSIS — R262 Difficulty in walking, not elsewhere classified: Secondary | ICD-10-CM | POA: Diagnosis present

## 2022-04-25 DIAGNOSIS — R2681 Unsteadiness on feet: Secondary | ICD-10-CM

## 2022-04-25 DIAGNOSIS — R278 Other lack of coordination: Secondary | ICD-10-CM | POA: Diagnosis present

## 2022-04-25 DIAGNOSIS — M6281 Muscle weakness (generalized): Secondary | ICD-10-CM

## 2022-04-25 DIAGNOSIS — M25661 Stiffness of right knee, not elsewhere classified: Secondary | ICD-10-CM | POA: Diagnosis present

## 2022-04-25 DIAGNOSIS — R2689 Other abnormalities of gait and mobility: Secondary | ICD-10-CM | POA: Diagnosis present

## 2022-04-25 DIAGNOSIS — R269 Unspecified abnormalities of gait and mobility: Secondary | ICD-10-CM

## 2022-04-25 DIAGNOSIS — M25561 Pain in right knee: Secondary | ICD-10-CM | POA: Diagnosis present

## 2022-04-25 DIAGNOSIS — M5459 Other low back pain: Secondary | ICD-10-CM | POA: Diagnosis present

## 2022-04-25 NOTE — Therapy (Signed)
OUTPATIENT PHYSICAL THERAPY LOWER EXTREMITY Treatment  Patient Name: Leslie Smith MRN: [id number] DOB:[birth year], 71 y.o., female Today's Date: 04/25/2022   PT End of Session - 04/25/22 0800     Visit Number 19    Number of Visits 38    Date for PT Re-Evaluation 05/21/22    Authorization Type Medicare Parts A & B    Authorization Time Period 12/25/21-02/20/22    Progress Note Due on Visit 42    PT Start Time 0801    PT Stop Time 0845    PT Time Calculation (min) 44 min    Equipment Utilized During Treatment Gait belt    Activity Tolerance Patient tolerated treatment well    Behavior During Therapy WFL for tasks assessed/performed                  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[place]) 09/15/2014   Bladder incontinence    Cancer ([place]) 2014   brain tumor, resected   Cellulitis and abscess of leg 2019   2 episodes this year   Complication of anesthesia    DJD (degenerative joint disease) of knee 2018   Fall    Fatigue    History of blood transfusion    Hypertension    MS (multiple sclerosis) ([place])    Myelitis ([place])    Neuromuscular disorder ([place]) 2001   multiple sclerosis diagnosed by MRI   PONV (postoperative nausea and vomiting)    severe   Thrombocytopenia ([place])    Past Surgical History:  Procedure Laterality Date   ABDOMINAL HYSTERECTOMY  2011   required wound vac for 6 weeks. ovaries had grown attached to her back bone   BRAIN SURGERY  2015   tumor resection-denies seizures   BREAST SURGERY Left 2014   biopsy. negative   HARDWARE REMOVAL Left 05/05/2018   Procedure: HARDWARE REMOVAL- LEFT ANKLE;  Surgeon: Hessie Knows, MD;  Location: [facility];  Service: Orthopedics;  Laterality: Left;   I & D EXTREMITY Left 08/25/2015   Procedure: IRRIGATION AND DEBRIDEMENT THUMB;  Surgeon: Iran Planas, MD;  Location: [place];  Service: Orthopedics;  Laterality: Left;   ORIF ANKLE FRACTURE Left 02/20/2018   Procedure: OPEN REDUCTION INTERNAL  FIXATION (ORIF) ANKLE FRACTURE;  Surgeon: Hessie Knows, MD;  Location: [facility];  Service: Orthopedics;  Laterality: Left;   TOTAL KNEE ARTHROPLASTY Right 11/21/2021   Procedure: TOTAL KNEE ARTHROPLASTY;  Surgeon: Lovell Sheehan, MD;  Location: [facility];  Service: Orthopedics;  Laterality: Right;   Patient Active Problem List   Diagnosis Date Noted   S/P TKR (total knee replacement) using cement, right 11/21/2021   Atherosclerosis of abdominal aorta ([place]) 01/10/2021   Coronary artery disease involving native coronary artery of native heart 01/10/2021   OSA (obstructive sleep apnea) 04/19/2020   Statin intolerance 01/14/2020   S/P ORIF (open reduction internal fixation) fracture 02/20/2018   DJD (degenerative joint disease) of knee 01/02/2017   Advanced care planning/counseling discussion 01/02/2017   Thrombocytopenia ([place]) 10/30/2015   Multiple sclerosis ([place])    Essential hypertension    Hyperlipidemia    Benign neoplasm of brain ([place]) 04/30/2013   Cerebral meningioma ([place]) 04/29/2013   Malignant hypertension 04/29/2013   Obesity 04/15/2013    PCP: Fulton Reek, MD  REFERRING PROVIDER: Kurtis Bushman MD  REFERRING DIAG: R TKA, 11/21/21  THERAPY DIAG:  Difficulty in walking, not elsewhere classified  Muscle weakness (generalized)  Stiffness of right knee, not elsewhere classified  Other low back pain  Chronic pain of right knee  Unsteadiness on feet  Other abnormalities of gait and mobility  Other lack of coordination  Abnormality of gait and mobility  Rationale for Evaluation and Treatment Rehabilitation  ONSET DATE: 11/21/21  SUBJECTIVE:   SUBJECTIVE STATEMENT:   Pt reports that she has been busy lately, specifically going to a lot of funerals.  Pt notes that otherwise she has been good.   Are you having pain? Yes: NPRS scale: 4/10 Pain location: knees and back Pain description: stress test   PERTINENT HISTORY:  71 yo Female s/p R TKA on 11/21/21.  Patient reports having home health PT for 2-3 weeks. She reports doing well. She presents to therapy without AD. She reports not needing AD at home. She has been sleeping with CPAP and cryocuff on RLE- but has been sleeping well; PMH significant: MS, Sleep apnea, 2014 brain tumor, history of LE cellulitis, intermittent fluid build up/swelling in LE, DJD, HTN, Thrombocytopenia; Pt is followed by PCP regarding LE swelling; Pt  back on BP medications.    PRECAUTIONS: Fall Risk  WEIGHT BEARING RESTRICTIONS No  FALLS:  Has patient fallen in last 6 months? Yes. Number of falls 1  LIVING ENVIRONMENT: Lives with: lives alone Lives in: House/apartment Stairs: Yes: Internal: 13 steps; on right going up and External: 3 steps; has ramp Has following equipment at home: Single point cane, Walker - 2 wheeled, Environmental consultant - 4 wheeled, Crutches, bed side commode, and Grab bars  OCCUPATION: Retired Emergency planning/management officer  PLOF: Independent with basic ADLs, Independent with gait, and Independent with transfers   [place] transfers, increase endurance/activity tolerance, Improve strength Pt would like to be able to clean her house and be more active at home.     TODAY'S TREATMENT:  TherEx:  NuStep Interval Training, Seat 9, Handles 9  Level 1: 1 minute Level 3: 1 minute Level 2: 1 minute Level 4: 1 minute Level 3: 1 minute Level 1: 1 minute  Seated marches, 5# AW donned, 2x15 with cues for upright posture, back away from back of chair for abdominal/trunk activation/stabilization Seated LAQ, 5# AW donned, 3 second hold, 2x15 each LE, cuing for muscle activation and sequencing for neutral alignment Seated step over hedgehogs, 5# AW donned, 2x15 each LE  BTB hamstring curl x15 each LE Adduction ball squeeze x15  Adduction ball squeeze with LAQ x15 each LE   Supine LTR, 2x10 each side Supine bridging, 2x10 each side with verbal cuing for proper technique and glute squeeze prior to lift  off   Standing with 5# ankle weight donned in // bars  Hip extension with b upper extremity support, cueing for neutral hip alignment, upright posture for optimal muscle recruitment, and sequencing, 10x each LE,  Hip abduction with b upper extremity support, cueing for neutral foot alignment for correct muscle activation, 10x each LE Hip flexion with b upper extremity support, cueing for body mechanics, speed of muscle recruitment for optimal strengthening and stabilization 15x each LE Hamstring curl with b upper extremity support, cueing for knee alignment for recruitment of hamstring musculature, 10x each LE Heel raise x20        PATIENT EDUCATION:  Education details:  Pt educated throughout session about proper posture and technique with exercises. Improved exercise technique, movement at target joints, use of target muscles after min to mod verbal, visual, tactile cues.  Person educated: Patient Education method: Explanation, Demonstration, and Verbal cues Education comprehension: verbalized understanding, returned  demonstration, verbal cues required, and needs further education   HOME EXERCISE PROGRAM:  No updates today. Pt to continue HEP as previously given: Continue as given from home health PT Reinforced importance of ROM including quad sets and LAQ/knee flexion;   ASSESSMENT:  CLINICAL IMPRESSION:  Pt performed well with the exercises given and noted a reduction in overall back pain at conclusion with the exercises given at the end to promote increased lumbar mobility.  Pt continues to demonstrate increased weakness on the L side with tasks, and will continue to benefit from skilled interventions that address these deficits.   Pt will continue to benefit from skilled therapy to address remaining deficits in order to improve overall QoL and return to PLOF.       OBJECTIVE IMPAIRMENTS Abnormal gait, cardiopulmonary status limiting activity, decreased activity tolerance,  decreased balance, decreased endurance, decreased mobility, difficulty walking, decreased ROM, decreased strength, hypomobility, impaired flexibility, and pain.   ACTIVITY LIMITATIONS bending, standing, squatting, stairs, transfers, and locomotion level  PARTICIPATION LIMITATIONS: cleaning, laundry, shopping, community activity, occupation, yard work, and church  PERSONAL FACTORS Age, Fitness, and 3+ comorbidities: MS, Sleep apnea, 2014 brain tumor, history of LE cellulitis, intermittent fluid build up/swelling in LE, DJD, HTN, Thrombocytopenia;   are also affecting patient's functional outcome.   REHAB POTENTIAL: Good  CLINICAL DECISION MAKING: Stable/uncomplicated  EVALUATION COMPLEXITY: Low   GOALS: Goals reviewed with patient? Yes  SHORT TERM GOALS: Target date: 01/23/2022  Patient will be adherent to HEP at least 3x a week to improve functional strength and balance for better safety at home. Baseline: Doing some exercise from home health 8/22: yes; 9/19: feels confident/indep but states, "not doing as much as I should" (previously met) Goal status: MET  2.  Patient will improve RLE knee extension to 0 to improve knee ROM for functional tasks such as standing/walking.  Baseline: RLE: -5 degrees in supine 8/22: neutral  Goal status: MET   LONG TERM GOALS: Target date: 05/21/2022  Patient will increase 10 meter walk test to >1.61ms as to improve gait speed for better community ambulation and to reduce fall risk. Baseline: 0.98 m/s 8/22: 7.72 seconds =1.29 m/s  Goal status: MET  2.  Patient will tolerate 5 seconds of single leg stance without loss of balance to improve ability to get in and out of shower safely. Baseline: RLE: 3-5 sec inconsistent 8/22: 5 seconds Goal status: MET  3.   Patient will be independent with ascend/descend 12 steps using single UE in step over step pattern without LOB to safely get up/down stairs in home. Baseline: Not formally assessed, negotiates  one step at a time; limits steps when possible 8/22: reciprocal ascending; step to descending; 9/19:able to perform recip pattern ascending/descending  Goal status: Met  4.  Pt will improve RLE knee flexion to >115 degrees for functional ROM for transfers and step negotiation;  Baseline: RLE: 108 degrees 8/22: 125  Goal status: MET  5.  Patient (> 692years old) will complete five times sit to stand test in < 15 seconds indicating an increased LE strength and improved balance. Baseline: 19.95 sec 8/22: 15.65 seconds; 9/19: 14.5 sec hands-free Goal status: Met  6.  Patient will improve FOTO score by 5% to indicate improved functional mobility with ADLs.  Baseline: 46% 8/22: 55%; 9/19: 57% Goal status: Met   7. Patient will increase six minute walk test distance to >1300 for improved functional capacity and gait ability. Baseline: 9/19: 1190 ft with one  rest break due to fatigue, rates completing test as difficult Goal status: NEW    8. Patient will report a worst R LE pain as no greater than 1/10 on VAS in order to improve tolerance with ADLs and reduced symptoms with activities.  Baseline: 9/19: pain still greater than 3-4/10.  Goal status: NEW  PLAN: PT FREQUENCY: 2x/week  PT DURATION: 12 weeks  PLANNED INTERVENTIONS: Therapeutic exercises, Therapeutic activity, Neuromuscular re-education, Balance training, Gait training, Patient/Family education, Self Care, Joint mobilization, Stair training, Dry Needling, Cryotherapy, Moist heat, scar mobilization, Taping, and Manual therapy  PLAN FOR NEXT SESSION:   Progress note next visit. circuit/interval training, work on ROM/strengthening, dry needling, endurance, continue plan   Gwenlyn Saran, PT, DPT Physical Therapist- [facility]  04/25/22, 8:00 AM

## 2022-04-30 ENCOUNTER — Encounter: Payer: Self-pay | Admitting: Oncology

## 2022-04-30 ENCOUNTER — Encounter: Payer: Medicare Other | Admitting: Physical Therapy

## 2022-04-30 ENCOUNTER — Inpatient Hospital Stay: Payer: Medicare Other | Attending: Oncology | Admitting: Oncology

## 2022-04-30 ENCOUNTER — Inpatient Hospital Stay: Payer: Medicare Other

## 2022-04-30 VITALS — BP 140/61 | HR 55 | Temp 97.7°F | Resp 18 | Wt 289.9 lb

## 2022-04-30 DIAGNOSIS — Z79899 Other long term (current) drug therapy: Secondary | ICD-10-CM | POA: Diagnosis not present

## 2022-04-30 DIAGNOSIS — D693 Immune thrombocytopenic purpura: Secondary | ICD-10-CM | POA: Diagnosis present

## 2022-04-30 DIAGNOSIS — D649 Anemia, unspecified: Secondary | ICD-10-CM | POA: Diagnosis not present

## 2022-04-30 LAB — COMPREHENSIVE METABOLIC PANEL
ALT: 15 U/L (ref 0–44)
AST: 18 U/L (ref 15–41)
Albumin: 4.2 g/dL (ref 3.5–5.0)
Alkaline Phosphatase: 72 U/L (ref 38–126)
Anion gap: 8 (ref 5–15)
BUN: 25 mg/dL — ABNORMAL HIGH (ref 8–23)
CO2: 25 mmol/L (ref 22–32)
Calcium: 9 mg/dL (ref 8.9–10.3)
Chloride: 104 mmol/L (ref 98–111)
Creatinine, Ser: 1.21 mg/dL — ABNORMAL HIGH (ref 0.44–1.00)
GFR, Estimated: 48 mL/min — ABNORMAL LOW (ref >60)
Glucose, Bld: 113 mg/dL — ABNORMAL HIGH (ref 70–99)
Potassium: 3.7 mmol/L (ref 3.5–5.1)
Sodium: 137 mmol/L (ref 135–145)
Total Bilirubin: 0.5 mg/dL (ref 0.3–1.2)
Total Protein: 7.5 g/dL (ref 6.5–8.1)

## 2022-04-30 LAB — CBC WITH DIFFERENTIAL/PLATELET
Abs Immature Granulocytes: 0.04 10*3/uL (ref 0.00–0.07)
Basophils Absolute: 0.1 10*3/uL (ref 0.0–0.1)
Basophils Relative: 1 %
Eosinophils Absolute: 0.3 10*3/uL (ref 0.0–0.5)
Eosinophils Relative: 4 %
HCT: 36.4 % (ref 36.0–46.0)
Hemoglobin: 11.7 g/dL — ABNORMAL LOW (ref 12.0–15.0)
Immature Granulocytes: 0 %
Lymphocytes Relative: 17 %
Lymphs Abs: 1.5 10*3/uL (ref 0.7–4.0)
MCH: 29.2 pg (ref 26.0–34.0)
MCHC: 32.1 g/dL (ref 30.0–36.0)
MCV: 90.8 fL (ref 80.0–100.0)
Monocytes Absolute: 0.5 10*3/uL (ref 0.1–1.0)
Monocytes Relative: 5 %
Neutro Abs: 6.8 10*3/uL (ref 1.7–7.7)
Neutrophils Relative %: 73 %
Platelets: 136 10*3/uL — ABNORMAL LOW (ref 150–400)
RBC: 4.01 MIL/uL (ref 3.87–5.11)
RDW: 14.6 % (ref 11.5–15.5)
WBC: 9.3 10*3/uL (ref 4.0–10.5)
nRBC: 0 % (ref 0.0–0.2)

## 2022-04-30 LAB — RETICULOCYTES
Immature Retic Fract: 12.6 % (ref 2.3–15.9)
RBC.: 4.03 MIL/uL (ref 3.87–5.11)
Retic Count, Absolute: 76.2 10*3/uL (ref 19.0–186.0)
Retic Ct Pct: 1.9 % (ref 0.4–3.1)

## 2022-04-30 LAB — IRON AND TIBC
Iron: 75 ug/dL (ref 28–170)
Saturation Ratios: 19 % (ref 10.4–31.8)
TIBC: 396 ug/dL (ref 250–450)
UIBC: 321 ug/dL

## 2022-04-30 LAB — FERRITIN: Ferritin: 58 ng/mL (ref 11–307)

## 2022-04-30 LAB — FOLATE: Folate: 10.6 ng/mL (ref >5.9)

## 2022-04-30 LAB — LACTATE DEHYDROGENASE: LDH: 179 U/L (ref 98–192)

## 2022-04-30 NOTE — Progress Notes (Signed)
Hematology/Oncology Consult note [facility]  [phone] [phone]  Patient Care Team: Idelle Crouch, MD as PCP - General (Internal Medicine)   Name of the patient: Leslie Smith  [id number]  09/23/1950   Date of visit: 04/30/22  Diagnosis-ITP Normocytic anemia  Chief complaint/ Reason for visit-routine follow-up of anemia  Heme/Onc history: Patient is a 71 year old female with a past medical history significant for hypertension, multiple sclerosis who was referred to [place] for thrombocytopenia.  Of note patient has moderate thrombocytopenia at least dating back for the last 5 years.  Her CBC in November 2015 showed white count of 6.7, H&H of 12.8/40.1 and a platelet count of 49.  Since then her platelet count has gone up and down and usually remains between 40s to 60s.  On one occasion in 2017 it did drop down to 19.  Most recent platelet count from 01/04/2019 showed white count of 9.4, H&H of 11.5/35.5 and a platelet count of 62.  Patient has undergone hysterectomy in 2011 as well as ankle surgery in 2019 despite her low platelet count and did not have any significant bleeding issues.     Results of blood work from 01/04/2019 were as follows: CBC showed white count of 9.4, H&H 11.5/35.5 and a platelet count of 62.  B12 level is low at 219.  Folate was normal.  HIV testing was negative.  Smear review was unremarkable and H. pylori stool antigen was negative    Interval history-patient reports doing well overall.  Energy levels are improved.  Denies any bleeding or bruising.  She did notice a rash in her right knee and will be discussing it further with Dr. Doy Hutching.  She feels that this rash started after she started using metoprolol.  ECOG PS- 1 Pain scale- 0 Opioid associated constipation- no  Review of systems- Review of Systems  Constitutional:  Negative for chills, fever, malaise/fatigue and weight loss.  HENT:  Negative for congestion,  ear discharge and nosebleeds.   Eyes:  Negative for blurred vision.  Respiratory:  Negative for cough, hemoptysis, sputum production, shortness of breath and wheezing.   Cardiovascular:  Negative for chest pain, palpitations, orthopnea and claudication.  Gastrointestinal:  Negative for abdominal pain, blood in stool, constipation, diarrhea, heartburn, melena, nausea and vomiting.  Genitourinary:  Negative for dysuria, flank pain, frequency, hematuria and urgency.  Musculoskeletal:  Negative for back pain, joint pain and myalgias.  Skin:  Positive for rash.  Neurological:  Negative for dizziness, tingling, focal weakness, seizures, weakness and headaches.  Endo/Heme/Allergies:  Does not bruise/bleed easily.  Psychiatric/Behavioral:  Negative for depression and suicidal ideas. The patient does not have insomnia.       Allergies  Allergen Reactions   Ezetimibe Other (See Comments)    Joint Pain    Watermelon [Citrullus Vulgaris] Diarrhea    Severe diarrhea within 2 minutes of eating   Adhesive [Tape]    Other Nausea And Vomiting    general anesthesia - deathly ill    Atorvastatin Other (See Comments)    Joint pain   Meloxicam Nausea Only     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[place]) 09/15/2014   Bladder incontinence    Cancer ([place]) 2014   brain tumor, resected   Cellulitis and abscess of leg 2019   2 episodes this year   Complication of anesthesia    DJD (degenerative joint disease) of knee 2018   Fall  Fatigue    History of blood transfusion    Hypertension    MS (multiple sclerosis) ([place])    Myelitis ([place])    Neuromuscular disorder ([place]) 2001   multiple sclerosis diagnosed by MRI   PONV (postoperative nausea and vomiting)    severe   Thrombocytopenia ([place])      Past Surgical History:  Procedure Laterality Date   ABDOMINAL HYSTERECTOMY  2011   required wound vac for 6 weeks. ovaries had grown attached to her back bone   BRAIN SURGERY   2015   tumor resection-denies seizures   BREAST SURGERY Left 2014   biopsy. negative   HARDWARE REMOVAL Left 05/05/2018   Procedure: HARDWARE REMOVAL- LEFT ANKLE;  Surgeon: Hessie Knows, MD;  Location: [facility];  Service: Orthopedics;  Laterality: Left;   I & D EXTREMITY Left 08/25/2015   Procedure: IRRIGATION AND DEBRIDEMENT THUMB;  Surgeon: Iran Planas, MD;  Location: [place];  Service: Orthopedics;  Laterality: Left;   ORIF ANKLE FRACTURE Left 02/20/2018   Procedure: OPEN REDUCTION INTERNAL FIXATION (ORIF) ANKLE FRACTURE;  Surgeon: Hessie Knows, MD;  Location: [facility];  Service: Orthopedics;  Laterality: Left;   TOTAL KNEE ARTHROPLASTY Right 11/21/2021   Procedure: TOTAL KNEE ARTHROPLASTY;  Surgeon: Lovell Sheehan, MD;  Location: [facility];  Service: Orthopedics;  Laterality: Right;    Social History   Socioeconomic History   Marital status: Single    Spouse name: Not on file   Number of children: Not on file   Years of education: high school, some college    Highest education level: High school graduate  Occupational History   Occupation: Training and development officer    Comment: disabled d/t MS  Tobacco Use   Smoking status: Never   Smokeless tobacco: Never  Vaping Use   Vaping Use: Never used  Substance and Sexual Activity   Alcohol use: Yes    Comment: occassional   Drug use: Yes    Types: Marijuana    Comment: uses for MS   Sexual activity: Not on file  Other Topics Concern   Not on file  Social History Narrative   Tai chi at [place] Strain: [place]  (12/17/2017)   Overall Financial Resource Strain (CARDIA)    Difficulty of Paying Living Expenses: Not very hard  Food Insecurity: No Food Insecurity (12/17/2017)   Hunger Vital Sign    Worried About Running Out of Food in the Last Year: Never true    Ran Out of Food in the Last Year: Never true  Transportation Needs: No Transportation Needs (12/17/2017)   PRAPARE - Armed forces logistics/support/administrative officer (Medical): No    Lack of Transportation (Non-Medical): No  Physical Activity: Inactive (12/17/2017)   Exercise Vital Sign    Days of Exercise per Week: 0 days    Minutes of Exercise per Session: 0 min  Stress: No Stress Concern Present (12/17/2017)   [place]    Feeling of Stress : Not at all  Social Connections: Somewhat Isolated (12/17/2017)   Social Connection and Isolation Panel [NHANES]    Frequency of Communication with Friends and Family: More than three times a week    Frequency of Social Gatherings with Friends and Family: More than three times a week    Attends Religious Services: More than 4 times per year    Active Member of Clubs or Organizations: No  Attends Archivist Meetings: Never    Marital Status: Never married  Intimate Partner Violence: Not At Risk (12/17/2017)   Humiliation, Afraid, Rape, and Kick questionnaire    Fear of Current or Ex-Partner: No    Emotionally Abused: No    Physically Abused: No    Sexually Abused: No    Family History  Problem Relation Age of Onset   Heart disease Father    Cancer Father      Current Outpatient Medications:    Cholecalciferol (VITAMIN D-3) 25 MCG (1000 UT) CAPS, Take 1,000 Units by mouth daily., Disp: , Rfl:    FEROSUL 325 (65 Fe) MG tablet, Take by mouth., Disp: , Rfl:    furosemide (LASIX) 20 MG tablet, Take 20 mg by mouth 2 (two) times daily., Disp: , Rfl:    hydrALAZINE (APRESOLINE) 50 MG tablet, Take 1 tablet by mouth 2 (two) times daily., Disp: , Rfl:    losartan-hydrochlorothiazide (HYZAAR) 100-25 MG tablet, , Disp: , Rfl:    metoprolol succinate (TOPROL-XL) 50 MG 24 hr tablet, Take 50 mg by mouth daily., Disp: , Rfl:    tiZANidine (ZANAFLEX) 4 MG tablet, Take 4 mg by mouth 3 (three) times daily., Disp: , Rfl:    vitamin B-12 (CYANOCOBALAMIN) 1000 MCG tablet, Take 1,000 mcg by mouth daily., Disp: , Rfl:     aspirin 81 MG chewable tablet, Chew 1 tablet (81 mg total) by mouth 2 (two) times daily. (Patient not taking: Reported on 02/20/2022), Disp: 60 tablet, Rfl: 0   docusate sodium (COLACE) 100 MG capsule, Take 1 capsule (100 mg total) by mouth 2 (two) times daily. (Patient not taking: Reported on 02/20/2022), Disp: 30 capsule, Rfl: 0   ezetimibe (ZETIA) 10 MG tablet, Take 10 mg by mouth daily. (Patient not taking: Reported on 02/20/2022), Disp: , Rfl:    fluticasone (FLONASE) 50 MCG/ACT nasal spray, Place into the nose., Disp: , Rfl:    HYDROcodone-acetaminophen (NORCO/VICODIN) 5-325 MG tablet, Take 1 tablet by mouth every 4 (four) hours as needed for moderate pain (pain score 4-6). (Patient not taking: Reported on 02/20/2022), Disp: 30 tablet, Rfl: 0   ibuprofen (IBU) 800 MG tablet, Take 1 tablet (800 mg total) by mouth every 8 (eight) hours as needed for moderate pain. (Patient not taking: Reported on 01/30/2021), Disp: 30 tablet, Rfl: 6   losartan (COZAAR) 100 MG tablet, , Disp: , Rfl:    oxyCODONE (OXY IR/ROXICODONE) 5 MG immediate release tablet, Take 1 tablet every 4 hours by oral route. (Patient not taking: Reported on 04/30/2022), Disp: , Rfl:    traMADol (ULTRAM) 50 MG tablet, Take 1 tablet every 6 hours by oral route as needed. (Patient not taking: Reported on 02/20/2022), Disp: , Rfl:   Physical exam:  Vitals:   04/30/22 1026  BP: (!) 140/61  Pulse: (!) 55  Resp: 18  Temp: 97.7 F (36.5 C)  SpO2: 99%  Weight: 289 lb 14.4 oz (131.5 kg)   Physical Exam Constitutional:      General: She is not in acute distress. Cardiovascular:     Rate and Rhythm: Normal rate and regular rhythm.     Heart sounds: Normal heart sounds.  Pulmonary:     Effort: Pulmonary effort is normal.     Breath sounds: Normal breath sounds.  Abdominal:     General: Bowel sounds are normal.     Palpations: Abdomen is soft.  Skin:    General: Skin is warm and dry.  Comments: Eczematous circular rash noted over  right knee  Neurological:     Mental Status: She is alert and oriented to person, place, and time.         Latest Ref Rng & Units 04/30/2022   10:02 AM  CMP  Glucose 70 - 99 mg/dL 113   BUN 8 - 23 mg/dL 25   Creatinine 0.44 - 1.00 mg/dL 1.21   Sodium 135 - 145 mmol/L 137   Potassium 3.5 - 5.1 mmol/L 3.7   Chloride 98 - 111 mmol/L 104   CO2 22 - 32 mmol/L 25   Calcium 8.9 - 10.3 mg/dL 9.0   Total Protein 6.5 - 8.1 g/dL 7.5   Total Bilirubin 0.3 - 1.2 mg/dL 0.5   Alkaline Phos 38 - 126 U/L 72   AST 15 - 41 U/L 18   ALT 0 - 44 U/L 15       Latest Ref Rng & Units 04/30/2022   10:02 AM  CBC  WBC 4.0 - 10.5 K/uL 9.3   Hemoglobin 12.0 - 15.0 g/dL 11.7   Hematocrit 36.0 - 46.0 % 36.4   Platelets 150 - 400 K/uL 136     No images are attached to the encounter.  No results found.   Assessment and plan- Patient is a 71 y.o. female who is here for follow-up of following issues:  Chronic ACZ:YSAYTKZS counts have improved significantly over the last 1 year her platelet count was down to 40s to 50s in 2018 and 2019 and has gone up to 136 on its own without any intervention.  Continue to monitor  Normocytic anemia: Baseline hemoglobin runs between 11-12 and was down to 10.8 in August 2022 and remained around 10 over the last 1 year.  Today it is again picked up at 11.7.  Further anemia work-up is pending.  I will repeat CBC with differential in 6 months in 1 year and I will see her back in 1 year   Visit Diagnosis 1. Chronic ITP (idiopathic thrombocytopenia) (HCC)   2. Normocytic anemia      Dr. Randa Evens, MD, MPH [facility] [phone] 04/30/2022 10:43 AM

## 2022-05-01 LAB — KAPPA/LAMBDA LIGHT CHAINS
Kappa free light chain: 35.4 mg/L — ABNORMAL HIGH (ref 3.3–19.4)
Kappa, lambda light chain ratio: 1.88 — ABNORMAL HIGH (ref 0.26–1.65)
Lambda free light chains: 18.8 mg/L (ref 5.7–26.3)

## 2022-05-02 LAB — HAPTOGLOBIN: Haptoglobin: 224 mg/dL (ref 42–346)

## 2022-05-06 LAB — MULTIPLE MYELOMA PANEL, SERUM
Albumin SerPl Elph-Mcnc: 3.6 g/dL (ref 2.9–4.4)
Albumin/Glob SerPl: 1.2 (ref 0.7–1.7)
Alpha 1: 0.3 g/dL (ref 0.0–0.4)
Alpha2 Glob SerPl Elph-Mcnc: 0.8 g/dL (ref 0.4–1.0)
B-Globulin SerPl Elph-Mcnc: 1.1 g/dL (ref 0.7–1.3)
Gamma Glob SerPl Elph-Mcnc: 0.9 g/dL (ref 0.4–1.8)
Globulin, Total: 3.1 g/dL (ref 2.2–3.9)
IgA: 199 mg/dL (ref 64–422)
IgG (Immunoglobin G), Serum: 875 mg/dL (ref 586–1602)
IgM (Immunoglobulin M), Srm: 116 mg/dL (ref 26–217)
Total Protein ELP: 6.7 g/dL (ref 6.0–8.5)

## 2022-05-07 ENCOUNTER — Encounter: Payer: Medicare Other | Admitting: Physical Therapy

## 2022-05-08 NOTE — Therapy (Signed)
OUTPATIENT PHYSICAL THERAPY LOWER EXTREMITY TREATMENT/PHYSICAL THERAPY PROGRESS NOTE   Dates of reporting period  01/29/22   to   05/09/22   Patient Name: Leslie Smith MRN: [id number] DOB:1950-09-25, 71 y.o., female Today's Date: 05/09/2022   PT End of Session - 05/09/22 0809     Visit Number 20    Number of Visits 38    Date for PT Re-Evaluation 05/21/22    Authorization Type Medicare Parts A & B    Authorization Time Period 12/25/21-02/20/22    Progress Note Due on Visit 20    PT Start Time 0809    PT Stop Time 0845    PT Time Calculation (min) 36 min    Equipment Utilized During Treatment Gait belt    Activity Tolerance Patient tolerated treatment well    Behavior During Therapy WFL for tasks assessed/performed             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 ovaries had grown attached to her back bone   BRAIN SURGERY  2015   tumor resection-denies seizures   BREAST SURGERY Left 2014   biopsy. negative   HARDWARE REMOVAL Left 05/05/2018   Procedure: HARDWARE REMOVAL- LEFT ANKLE;  Surgeon: Kennedy Bucker, MD;  Location: [facility];  Service: Orthopedics;  Laterality: Left;   I & D EXTREMITY Left 08/25/2015   Procedure: IRRIGATION AND DEBRIDEMENT THUMB;  Surgeon: Bradly Bienenstock, MD;  Location: [facility];  Service: Orthopedics;  Laterality:  Left;   ORIF ANKLE FRACTURE Left 02/20/2018   Procedure: OPEN REDUCTION INTERNAL FIXATION (ORIF) ANKLE FRACTURE;  Surgeon: Kennedy Bucker, MD;  Location: [facility];  Service: Orthopedics;  Laterality: Left;   TOTAL KNEE ARTHROPLASTY Right 11/21/2021   Procedure: TOTAL KNEE ARTHROPLASTY;  Surgeon: Lyndle Herrlich, MD;  Location: [facility];  Service: Orthopedics;  Laterality: Right;   Patient Active Problem List   Diagnosis Date Noted   S/P TKR (total knee replacement) using cement, right 11/21/2021   Atherosclerosis of abdominal aorta (HCC) 01/10/2021   Coronary artery disease involving native coronary artery of native heart 01/10/2021   OSA (obstructive sleep apnea) 04/19/2020   Statin intolerance 01/14/2020   S/P ORIF (open reduction internal fixation) fracture 02/20/2018   DJD (degenerative joint disease) of knee 01/02/2017   Advanced care planning/counseling discussion 01/02/2017   Thrombocytopenia (HCC) 10/30/2015   Multiple sclerosis (HCC)    Essential hypertension    Hyperlipidemia    Benign neoplasm of brain (HCC) 04/30/2013   Cerebral meningioma (HCC) 04/29/2013   Malignant hypertension 04/29/2013   Obesity 04/15/2013    PCP: Aram Beecham, MD  REFERRING PROVIDER: Cassell Smiles MD  REFERRING DIAG: R TKA, 11/21/21  THERAPY DIAG:  Difficulty in walking, not elsewhere  classified  Muscle weakness (generalized)  Stiffness of right knee, not elsewhere classified  Other low back pain  Chronic pain of right knee  Unsteadiness on feet  Other abnormalities of gait and mobility  Rationale for Evaluation and Treatment Rehabilitation  ONSET DATE: 11/21/21  SUBJECTIVE:   SUBJECTIVE STATEMENT:   Pt notes she is doing well and has been staying with a friend while they re-cooperating from a TKA.  Pt reports she has not been getting a whole lot of sleep the past 2 weeks.   Are you having pain? Yes: NPRS scale: 4/10 Pain location: knees and back Pain description: stress  test   PERTINENT HISTORY:  71 yo Female s/p R TKA on 11/21/21. Patient reports having home health PT for 2-3 weeks. She reports doing well. She presents to therapy without AD. She reports not needing AD at home. She has been sleeping with CPAP and cryocuff on RLE- but has been sleeping well; PMH significant: MS, Sleep apnea, 2014 brain tumor, history of LE cellulitis, intermittent fluid build up/swelling in LE, DJD, HTN, Thrombocytopenia; Pt is followed by PCP regarding LE swelling; Pt  back on BP medications.    PRECAUTIONS: Fall Risk  WEIGHT BEARING RESTRICTIONS No  FALLS:  Has patient fallen in last 6 months? Yes. Number of falls 1  LIVING ENVIRONMENT: Lives with: lives alone Lives in: House/apartment Stairs: Yes: Internal: 13 steps; on right going up and External: 3 steps; has ramp Has following equipment at home: Single point cane, Walker - 2 wheeled, Environmental consultant - 4 wheeled, Crutches, bed side commode, and Grab bars  OCCUPATION: Retired Producer, television/film/video  PLOF: Independent with basic ADLs, Independent with gait, and Independent with transfers   PATIENT GOALS Improve transfers, increase endurance/activity tolerance, Improve strength Pt would like to be able to clean her house and be more active at home.     TODAY'S TREATMENT:  TherEx:  Goal assessment performed as noted below:  Seated marches, 5# AW donned, 2x15 with cues for upright posture, back away from back of chair for abdominal/trunk activation/stabilization Seated LAQ, 5# AW donned, 3 second hold, 2x15 each LE, cuing for muscle activation and sequencing for neutral alignment BTB hamstring curl x15 each LE Seated adduction ball squeeze x15  Seated adduction ball squeeze with LAQ x15 each LE   PATIENT EDUCATION:  Education details:  Pt educated throughout session about proper posture and technique with exercises. Improved exercise technique, movement at target joints, use of target muscles after min to mod verbal, visual,  tactile cues.  Person educated: Patient Education method: Explanation, Demonstration, and Verbal cues Education comprehension: verbalized understanding, returned demonstration, verbal cues required, and needs further education   HOME EXERCISE PROGRAM:  No updates today. Pt to continue HEP as previously given: Continue as given from home health PT Reinforced importance of ROM including quad sets and LAQ/knee flexion;   ASSESSMENT:  CLINICAL IMPRESSION: Pt performed well with the exercises and put forth good effort throughout the entire session.  Pt is able to improve on exercises and perform tasks with minimal muscular fatigue.  Pt is making good progress with goals and her endurance level has improved due to not requiring a rest break with the .  Patient's condition has the potential to improve in response to therapy. Maximum improvement is yet to be obtained. The anticipated improvement is attainable and reasonable in a generally predictable time.   Pt will continue to benefit from skilled therapy to address remaining deficits in order to improve  overall QoL and return to PLOF.       OBJECTIVE IMPAIRMENTS Abnormal gait, cardiopulmonary status limiting activity, decreased activity tolerance, decreased balance, decreased endurance, decreased mobility, difficulty walking, decreased ROM, decreased strength, hypomobility, impaired flexibility, and pain.   ACTIVITY LIMITATIONS bending, standing, squatting, stairs, transfers, and locomotion level  PARTICIPATION LIMITATIONS: cleaning, laundry, shopping, community activity, occupation, yard work, and church  PERSONAL FACTORS Age, Fitness, and 3+ comorbidities: MS, Sleep apnea, 2014 brain tumor, history of LE cellulitis, intermittent fluid build up/swelling in LE, DJD, HTN, Thrombocytopenia;   are also affecting patient's functional outcome.   REHAB POTENTIAL: Good  CLINICAL DECISION MAKING: Stable/uncomplicated  EVALUATION COMPLEXITY:  Low   GOALS: Goals reviewed with patient? Yes  SHORT TERM GOALS: Target date: 01/23/2022  Patient will be adherent to HEP at least 3x a week to improve functional strength and balance for better safety at home. Baseline: Doing some exercise from home health 8/22: yes; 9/19: feels confident/indep but states, "not doing as much as I should" (previously met) Goal status: MET  2.  Patient will improve RLE knee extension to 0 to improve knee ROM for functional tasks such as standing/walking.  Baseline: RLE: -5 degrees in supine 8/22: neutral  Goal status: MET   LONG TERM GOALS: Target date: 05/21/2022  Patient will increase 10 meter walk test to >1.47m/s as to improve gait speed for better community ambulation and to reduce fall risk. Baseline: 0.98 m/s 8/22: 7.72 seconds =1.29 m/s  Goal status: MET  2.  Patient will tolerate 5 seconds of single leg stance without loss of balance to improve ability to get in and out of shower safely. Baseline: RLE: 3-5 sec inconsistent 8/22: 5 seconds Goal status: MET  3.   Patient will be independent with ascend/descend 12 steps using single UE in step over step pattern without LOB to safely get up/down stairs in home. Baseline: Not formally assessed, negotiates one step at a time; limits steps when possible 8/22: reciprocal ascending; step to descending; 9/19:able to perform recip pattern ascending/descending  Goal status: MET  4.  Pt will improve RLE knee flexion to >115 degrees for functional ROM for transfers and step negotiation;  Baseline: RLE: 108 degrees 8/22: 125  Goal status: MET  5.  Patient (> 22 years old) will complete five times sit to stand test in < 15 seconds indicating an increased LE strength and improved balance. Baseline: 19.95 sec 8/22: 15.65 seconds; 9/19: 14.5 sec hands-free Goal status: MET  6.  Patient will improve FOTO score by 5% to indicate improved functional mobility with ADLs.  Baseline: 46% 8/22: 55%; 9/19: 57% Goal  status: MET   7. Patient will increase six minute walk test distance to >1300 for improved functional capacity and gait ability. Baseline: 9/19: 1190 ft with one rest break due to fatigue, rates completing test as difficult 05/09/22:  1210 ft with no rest breaks Goal status: PROGRESSING    8. Patient will report a worst R LE pain as no greater than 1/10 on VAS in order to improve tolerance with ADLs and reduced symptoms with activities. Baseline: 9/19: pain still greater than 3-4/10. 05/09/22:  5/10 pain in the R LE at its worst over the past 7 days Goal status: PROGRESSING  PLAN: PT FREQUENCY: 2x/week  PT DURATION: 12 weeks  PLANNED INTERVENTIONS: Therapeutic exercises, Therapeutic activity, Neuromuscular re-education, Balance training, Gait training, Patient/Family education, Self Care, Joint mobilization, Stair training, Dry Needling, Cryotherapy, Moist heat, scar mobilization, Taping, and Manual therapy  PLAN FOR NEXT SESSION:   circuit/interval training, work on ROM/strengthening, dry needling, endurance, continue plan   Nolon Bussing, PT, DPT Physical Therapist- [facility]  05/09/22, 8:24 AM

## 2022-05-09 ENCOUNTER — Ambulatory Visit: Payer: Medicare Other

## 2022-05-09 DIAGNOSIS — R262 Difficulty in walking, not elsewhere classified: Secondary | ICD-10-CM | POA: Diagnosis not present

## 2022-05-09 DIAGNOSIS — M25661 Stiffness of right knee, not elsewhere classified: Secondary | ICD-10-CM

## 2022-05-09 DIAGNOSIS — R2681 Unsteadiness on feet: Secondary | ICD-10-CM

## 2022-05-09 DIAGNOSIS — M5459 Other low back pain: Secondary | ICD-10-CM

## 2022-05-09 DIAGNOSIS — R2689 Other abnormalities of gait and mobility: Secondary | ICD-10-CM

## 2022-05-09 DIAGNOSIS — M25561 Pain in right knee: Secondary | ICD-10-CM

## 2022-05-09 DIAGNOSIS — M6281 Muscle weakness (generalized): Secondary | ICD-10-CM

## 2022-05-14 ENCOUNTER — Ambulatory Visit: Payer: Medicare Other | Admitting: Physical Therapy

## 2022-05-16 ENCOUNTER — Ambulatory Visit: Payer: Medicare Other | Attending: Orthopedic Surgery

## 2022-05-16 DIAGNOSIS — R262 Difficulty in walking, not elsewhere classified: Secondary | ICD-10-CM | POA: Diagnosis present

## 2022-05-16 DIAGNOSIS — M25661 Stiffness of right knee, not elsewhere classified: Secondary | ICD-10-CM | POA: Diagnosis present

## 2022-05-16 DIAGNOSIS — R278 Other lack of coordination: Secondary | ICD-10-CM | POA: Diagnosis present

## 2022-05-16 DIAGNOSIS — M6281 Muscle weakness (generalized): Secondary | ICD-10-CM | POA: Insufficient documentation

## 2022-05-16 DIAGNOSIS — R269 Unspecified abnormalities of gait and mobility: Secondary | ICD-10-CM | POA: Diagnosis present

## 2022-05-16 DIAGNOSIS — R2681 Unsteadiness on feet: Secondary | ICD-10-CM | POA: Insufficient documentation

## 2022-05-16 DIAGNOSIS — M25561 Pain in right knee: Secondary | ICD-10-CM | POA: Insufficient documentation

## 2022-05-16 DIAGNOSIS — R2689 Other abnormalities of gait and mobility: Secondary | ICD-10-CM | POA: Insufficient documentation

## 2022-05-16 DIAGNOSIS — M5459 Other low back pain: Secondary | ICD-10-CM | POA: Diagnosis present

## 2022-05-16 NOTE — Therapy (Signed)
OUTPATIENT PHYSICAL THERAPY LOWER EXTREMITY TREATMENT   Patient Name: Leslie Smith MRN: [id number] DOB:[birth year], 71 y.o., female Today's Date: 05/16/2022   PT End of Session - 05/16/22 0808     Visit Number 21    Number of Visits 38    Date for PT Re-Evaluation 05/21/22    Authorization Type Medicare Parts A & B    Authorization Time Period 12/25/21-02/20/22    Progress Note Due on Visit 42    PT Start Time 0808    PT Stop Time 0845    PT Time Calculation (min) 37 min    Equipment Utilized During Treatment Gait belt    Activity Tolerance Patient tolerated treatment well    Behavior During Therapy [facility] for tasks assessed/performed             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[place]) 09/15/2014   Bladder incontinence    Cancer ([place]) 2014   brain tumor, resected   Cellulitis and abscess of leg 2019   2 episodes this year   Complication of anesthesia    DJD (degenerative joint disease) of knee 2018   Fall    Fatigue    History of blood transfusion    Hypertension    MS (multiple sclerosis) ([place])    Myelitis ([place])    Neuromuscular disorder ([place]) 2001   multiple sclerosis diagnosed by MRI   PONV (postoperative nausea and vomiting)    severe   Thrombocytopenia ([place])    Past Surgical History:  Procedure Laterality Date   ABDOMINAL HYSTERECTOMY  2011   required wound vac for 6 weeks. ovaries had grown attached to her back bone   BRAIN SURGERY  2015   tumor resection-denies seizures   BREAST SURGERY Left 2014   biopsy. negative   HARDWARE REMOVAL Left 05/05/2018   Procedure: HARDWARE REMOVAL- LEFT ANKLE;  Surgeon: Hessie Knows, MD;  Location: [facility];  Service: Orthopedics;  Laterality: Left;   I & D EXTREMITY Left 08/25/2015   Procedure: IRRIGATION AND DEBRIDEMENT THUMB;  Surgeon: Iran Planas, MD;  Location: [place];  Service: Orthopedics;  Laterality: Left;   ORIF ANKLE FRACTURE Left 02/20/2018   Procedure: OPEN REDUCTION INTERNAL FIXATION  (ORIF) ANKLE FRACTURE;  Surgeon: Hessie Knows, MD;  Location: [facility];  Service: Orthopedics;  Laterality: Left;   TOTAL KNEE ARTHROPLASTY Right 11/21/2021   Procedure: TOTAL KNEE ARTHROPLASTY;  Surgeon: Lovell Sheehan, MD;  Location: [facility];  Service: Orthopedics;  Laterality: Right;   Patient Active Problem List   Diagnosis Date Noted   S/P TKR (total knee replacement) using cement, right 11/21/2021   Atherosclerosis of abdominal aorta ([place]) 01/10/2021   Coronary artery disease involving native coronary artery of native heart 01/10/2021   OSA (obstructive sleep apnea) 04/19/2020   Statin intolerance 01/14/2020   S/P ORIF (open reduction internal fixation) fracture 02/20/2018   DJD (degenerative joint disease) of knee 01/02/2017   Advanced care planning/counseling discussion 01/02/2017   Thrombocytopenia ([place]) 10/30/2015   Multiple sclerosis ([place])    Essential hypertension    Hyperlipidemia    Benign neoplasm of brain ([place]) 04/30/2013   Cerebral meningioma ([place]) 04/29/2013   Malignant hypertension 04/29/2013   Obesity 04/15/2013    PCP: Fulton Reek, MD  REFERRING PROVIDER: Kurtis Bushman MD  REFERRING DIAG: R TKA, 11/21/21  THERAPY DIAG:  Difficulty in walking, not elsewhere classified  Muscle weakness (generalized)  Stiffness of right knee, not elsewhere classified  Other low back  pain  Chronic pain of right knee  Unsteadiness on feet  Other abnormalities of gait and mobility  Other lack of coordination  Abnormality of gait and mobility  Rationale for Evaluation and Treatment Rehabilitation  ONSET DATE: 11/21/21  SUBJECTIVE:   SUBJECTIVE STATEMENT:   Pt reports decreased pain upon arrival.  Pt notes that her MS is masking a lot this morning, but she's doing ok.      Are you having pain? Yes: NPRS scale: 3/10 Pain location: knees and back Pain description: stress test   PERTINENT HISTORY:  71 yo Female s/p R TKA on 11/21/21. Patient reports  having home health PT for 2-3 weeks. She reports doing well. She presents to therapy without AD. She reports not needing AD at home. She has been sleeping with CPAP and cryocuff on RLE- but has been sleeping well; PMH significant: MS, Sleep apnea, 2014 brain tumor, history of LE cellulitis, intermittent fluid build up/swelling in LE, DJD, HTN, Thrombocytopenia; Pt is followed by PCP regarding LE swelling; Pt  back on BP medications.    PRECAUTIONS: Fall Risk  WEIGHT BEARING RESTRICTIONS No  FALLS:  Has patient fallen in last 6 months? Yes. Number of falls 1  LIVING ENVIRONMENT: Lives with: lives alone Lives in: House/apartment Stairs: Yes: Internal: 13 steps; on right going up and External: 3 steps; has ramp Has following equipment at home: Single point cane, Walker - 2 wheeled, Environmental consultant - 4 wheeled, Crutches, bed side commode, and Grab bars  OCCUPATION: Retired Emergency planning/management officer  PLOF: Independent with basic ADLs, Independent with gait, and Independent with transfers   [place] transfers, increase endurance/activity tolerance, Improve strength Pt would like to be able to clean her house and be more active at home.     TODAY'S TREATMENT:  TherEx:  MHP placed on lumbar spine for pain modulation during performance of bed-level exercises  Hooklying hip adduction into physioball, 3 sec holds, 2x15 Hooklying PPT with 3 sec holds, 2x15 Hooklying bridges, 2x15 with verbal cuing for proper breathing sequence Hooklying LTR, 2x15 each side Supine SLR, 2x15 with QS prior to lift-off Supine TrA activation with 3 sec holds, 2x15 Supine TrA activation/holds with marches, 2x15 each LE Standing palloff press with 12.5# resistance, x10 each direction   PATIENT EDUCATION:  Education details:  Pt educated throughout session about proper posture and technique with exercises. Improved exercise technique, movement at target joints, use of target muscles after min to mod verbal, visual,  tactile cues.  Person educated: Patient Education method: Explanation, Demonstration, and Verbal cues Education comprehension: verbalized understanding, returned demonstration, verbal cues required, and needs further education   HOME EXERCISE PROGRAM:  No updates today. Pt to continue HEP as previously given: Continue as given from home health PT Reinforced importance of ROM including quad sets and LAQ/knee flexion;   ASSESSMENT:  CLINICAL IMPRESSION: Treatment session limited by pt arriving to the clinic late.  Pt performed well with all tasks and reported a reduction in her pain with the bed-level exercises and use of the MHP during performance.  Pt to continue with core strengthening in order to improve overall pain in the lumbar spine and core control.   Pt will continue to benefit from skilled therapy to address remaining deficits in order to improve overall QoL and return to PLOF.         OBJECTIVE IMPAIRMENTS Abnormal gait, cardiopulmonary status limiting activity, decreased activity tolerance, decreased balance, decreased endurance, decreased mobility, difficulty walking, decreased ROM, decreased strength,  hypomobility, impaired flexibility, and pain.   ACTIVITY LIMITATIONS bending, standing, squatting, stairs, transfers, and locomotion level  PARTICIPATION LIMITATIONS: cleaning, laundry, shopping, community activity, occupation, yard work, and church  PERSONAL FACTORS Age, Fitness, and 3+ comorbidities: MS, Sleep apnea, 2014 brain tumor, history of LE cellulitis, intermittent fluid build up/swelling in LE, DJD, HTN, Thrombocytopenia;   are also affecting patient's functional outcome.   REHAB POTENTIAL: Good  CLINICAL DECISION MAKING: Stable/uncomplicated  EVALUATION COMPLEXITY: Low   GOALS: Goals reviewed with patient? Yes  SHORT TERM GOALS: Target date: 01/23/2022  Patient will be adherent to HEP at least 3x a week to improve functional strength and balance for better  safety at home. Baseline: Doing some exercise from home health 8/22: yes; 9/19: feels confident/indep but states, "not doing as much as I should" (previously met) Goal status: MET  2.  Patient will improve RLE knee extension to 0 to improve knee ROM for functional tasks such as standing/walking.  Baseline: RLE: -5 degrees in supine 8/22: neutral  Goal status: MET   LONG TERM GOALS: Target date: 05/21/2022  Patient will increase 10 meter walk test to >1.96ms as to improve gait speed for better community ambulation and to reduce fall risk. Baseline: 0.98 m/s 8/22: 7.72 seconds =1.29 m/s  Goal status: MET  2.  Patient will tolerate 5 seconds of single leg stance without loss of balance to improve ability to get in and out of shower safely. Baseline: RLE: 3-5 sec inconsistent 8/22: 5 seconds Goal status: MET  3.   Patient will be independent with ascend/descend 12 steps using single UE in step over step pattern without LOB to safely get up/down stairs in home. Baseline: Not formally assessed, negotiates one step at a time; limits steps when possible 8/22: reciprocal ascending; step to descending; 9/19:able to perform recip pattern ascending/descending  Goal status: MET  4.  Pt will improve RLE knee flexion to >115 degrees for functional ROM for transfers and step negotiation;  Baseline: RLE: 108 degrees 8/22: 125  Goal status: MET  5.  Patient (> 676years old) will complete five times sit to stand test in < 15 seconds indicating an increased LE strength and improved balance. Baseline: 19.95 sec 8/22: 15.65 seconds; 9/19: 14.5 sec hands-free Goal status: MET  6.  Patient will improve FOTO score by 5% to indicate improved functional mobility with ADLs.  Baseline: 46% 8/22: 55%; 9/19: 57% Goal status: MET   7. Patient will increase six minute walk test distance to >1300 for improved functional capacity and gait ability. Baseline: 9/19: 1190 ft with one rest break due to fatigue, rates  completing test as difficult 05/09/22:  1210 ft with no rest breaks Goal status: PROGRESSING    8. Patient will report a worst R LE pain as no greater than 1/10 on VAS in order to improve tolerance with ADLs and reduced symptoms with activities. Baseline: 9/19: pain still greater than 3-4/10. 05/09/22:  5/10 pain in the R LE at its worst over the past 7 days Goal status: PROGRESSING  PLAN: PT FREQUENCY: 2x/week  PT DURATION: 12 weeks  PLANNED INTERVENTIONS: Therapeutic exercises, Therapeutic activity, Neuromuscular re-education, Balance training, Gait training, Patient/Family education, Self Care, Joint mobilization, Stair training, Dry Needling, Cryotherapy, Moist heat, scar mobilization, Taping, and Manual therapy  PLAN FOR NEXT SESSION:   circuit/interval training, work on ROM/strengthening, dry needling, endurance, continue plan   JGwenlyn Saran PT, DPT Physical Therapist- [facility] 05/16/22, 8:09  AM

## 2022-05-23 ENCOUNTER — Ambulatory Visit: Payer: Medicare Other

## 2022-05-23 DIAGNOSIS — R262 Difficulty in walking, not elsewhere classified: Secondary | ICD-10-CM

## 2022-05-23 DIAGNOSIS — R2681 Unsteadiness on feet: Secondary | ICD-10-CM

## 2022-05-23 DIAGNOSIS — R269 Unspecified abnormalities of gait and mobility: Secondary | ICD-10-CM

## 2022-05-23 DIAGNOSIS — M5459 Other low back pain: Secondary | ICD-10-CM

## 2022-05-23 DIAGNOSIS — M25561 Pain in right knee: Secondary | ICD-10-CM

## 2022-05-23 DIAGNOSIS — R2689 Other abnormalities of gait and mobility: Secondary | ICD-10-CM

## 2022-05-23 DIAGNOSIS — R278 Other lack of coordination: Secondary | ICD-10-CM

## 2022-05-23 DIAGNOSIS — M6281 Muscle weakness (generalized): Secondary | ICD-10-CM

## 2022-05-23 DIAGNOSIS — M25661 Stiffness of right knee, not elsewhere classified: Secondary | ICD-10-CM

## 2022-05-23 NOTE — Therapy (Addendum)
OUTPATIENT PHYSICAL THERAPY LOWER EXTREMITY TREATMENT   Patient Name: Leslie Smith MRN: [id number] DOB:March 04, 1951, 71 y.o., female Today's Date: 05/23/2022   PT End of Session - 05/23/22 1305     Visit Number 22    Number of Visits 31    Date for PT Re-Evaluation 07/04/22    Authorization Type Medicare Parts A & B    Authorization Time Period 12/25/21-02/20/22; 05/23/22-07/04/22    Progress Note Due on Visit 20    PT Start Time 1302    PT Stop Time 1345    PT Time Calculation (min) 43 min    Equipment Utilized During Treatment Gait belt    Activity Tolerance Patient tolerated treatment well    Behavior During Therapy WFL for tasks assessed/performed             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[place]) 09/15/2014   Bladder incontinence    Cancer ([place]) 2014   brain tumor, resected   Cellulitis and abscess of leg 2019   2 episodes this year   Complication of anesthesia    DJD (degenerative joint disease) of knee 2018   Fall    Fatigue    History of blood transfusion    Hypertension    MS (multiple sclerosis) ([place])    Myelitis ([place])    Neuromuscular disorder ([place]) 2001   multiple sclerosis diagnosed by MRI   PONV (postoperative nausea and vomiting)    severe   Thrombocytopenia ([place])    Past Surgical History:  Procedure Laterality Date   ABDOMINAL HYSTERECTOMY  2011   required wound vac for 6 weeks. ovaries had grown attached to her back bone   BRAIN SURGERY  2015   tumor resection-denies seizures   BREAST SURGERY Left 2014   biopsy. negative   HARDWARE REMOVAL Left 05/05/2018   Procedure: HARDWARE REMOVAL- LEFT ANKLE;  Surgeon: Hessie Knows, MD;  Location: [facility];  Service: Orthopedics;  Laterality: Left;   I & D EXTREMITY Left 08/25/2015   Procedure: IRRIGATION AND DEBRIDEMENT THUMB;  Surgeon: Iran Planas, MD;  Location: [place];  Service: Orthopedics;  Laterality: Left;   ORIF ANKLE FRACTURE Left 02/20/2018   Procedure: OPEN  REDUCTION INTERNAL FIXATION (ORIF) ANKLE FRACTURE;  Surgeon: Hessie Knows, MD;  Location: [facility];  Service: Orthopedics;  Laterality: Left;   TOTAL KNEE ARTHROPLASTY Right 11/21/2021   Procedure: TOTAL KNEE ARTHROPLASTY;  Surgeon: Lovell Sheehan, MD;  Location: [facility];  Service: Orthopedics;  Laterality: Right;   Patient Active Problem List   Diagnosis Date Noted   S/P TKR (total knee replacement) using cement, right 11/21/2021   Atherosclerosis of abdominal aorta ([place]) 01/10/2021   Coronary artery disease involving native coronary artery of native heart 01/10/2021   OSA (obstructive sleep apnea) 04/19/2020   Statin intolerance 01/14/2020   S/P ORIF (open reduction internal fixation) fracture 02/20/2018   DJD (degenerative joint disease) of knee 01/02/2017   Advanced care planning/counseling discussion 01/02/2017   Thrombocytopenia ([place]) 10/30/2015   Multiple sclerosis ([place])    Essential hypertension    Hyperlipidemia    Benign neoplasm of brain ([place]) 04/30/2013   Cerebral meningioma ([place]) 04/29/2013   Malignant hypertension 04/29/2013   Obesity 04/15/2013    PCP: Fulton Reek, MD  REFERRING PROVIDER: Kurtis Bushman MD  REFERRING DIAG: R TKA, 11/21/21  THERAPY DIAG:  Difficulty in walking, not elsewhere classified  Muscle weakness (generalized)  Stiffness of right knee, not elsewhere classified  Other low  back pain  Chronic pain of right knee  Unsteadiness on feet  Other abnormalities of gait and mobility  Other lack of coordination  Abnormality of gait and mobility  Rationale for Evaluation and Treatment Rehabilitation  ONSET DATE: 11/21/21  SUBJECTIVE:   SUBJECTIVE STATEMENT:   Pt reports she is doing well and is anticipating finding a senior center to do exercises and such to keep her occupied.    Are you having pain? Yes: NPRS scale: 4/10 Pain location: R knee Pain description: tight   PERTINENT HISTORY:  71 yo Female s/p R TKA on 11/21/21.  Patient reports having home health PT for 2-3 weeks. She reports doing well. She presents to therapy without AD. She reports not needing AD at home. She has been sleeping with CPAP and cryocuff on RLE- but has been sleeping well; PMH significant: MS, Sleep apnea, 2014 brain tumor, history of LE cellulitis, intermittent fluid build up/swelling in LE, DJD, HTN, Thrombocytopenia; Pt is followed by PCP regarding LE swelling; Pt  back on BP medications.    PRECAUTIONS: Fall Risk  WEIGHT BEARING RESTRICTIONS No  FALLS:  Has patient fallen in last 6 months? Yes. Number of falls 1  LIVING ENVIRONMENT: Lives with: lives alone Lives in: House/apartment Stairs: Yes: Internal: 13 steps; on right going up and External: 3 steps; has ramp Has following equipment at home: Single point cane, Walker - 2 wheeled, Environmental consultant - 4 wheeled, Crutches, bed side commode, and Grab bars  OCCUPATION: Retired Emergency planning/management officer  PLOF: Independent with basic ADLs, Independent with gait, and Independent with transfers   [place] transfers, increase endurance/activity tolerance, Improve strength Pt would like to be able to clean her house and be more active at home.     TODAY'S TREATMENT:  TherEx:  NuStep, seat 9, no arm use, utilized for improved mobility of the R knee Level 1 - 6 min    Neuro:  [employer], practice level 4:59 with 35 herring caught Korebalance [place], Levels I-V and Bonus Level 1, stability level 3   PATIENT EDUCATION:  Education details:  Pt educated throughout session about proper posture and technique with exercises. Improved exercise technique, movement at target joints, use of target muscles after min to mod verbal, visual, tactile cues.  Person educated: Patient Education method: Explanation, Demonstration, and Verbal cues Education comprehension: verbalized understanding, returned demonstration, verbal cues required, and needs further education   HOME  EXERCISE PROGRAM:  No updates today. Pt to continue HEP as previously given: Continue as given from home health PT Reinforced importance of ROM including quad sets and LAQ/knee flexion;   ASSESSMENT:  CLINICAL IMPRESSION:  Pt performed well with the tasks given and put forth great effort throughout the session.  Pt noted to appreciate the balance training on the [facility], and noted to have fun performing the tasks assigned on the computer.  Pt does need continued balance training along with core stability as well.   Pt will continue to benefit from skilled therapy to address remaining deficits in order to improve overall QoL and return to PLOF.      OBJECTIVE IMPAIRMENTS Abnormal gait, cardiopulmonary status limiting activity, decreased activity tolerance, decreased balance, decreased endurance, decreased mobility, difficulty walking, decreased ROM, decreased strength, hypomobility, impaired flexibility, and pain.   ACTIVITY LIMITATIONS bending, standing, squatting, stairs, transfers, and locomotion level  PARTICIPATION LIMITATIONS: cleaning, laundry, shopping, community activity, occupation, yard work, and church  PERSONAL FACTORS Age, Fitness, and 3+ comorbidities: MS, Sleep apnea, 2014  brain tumor, history of LE cellulitis, intermittent fluid build up/swelling in LE, DJD, HTN, Thrombocytopenia;   are also affecting patient's functional outcome.   REHAB POTENTIAL: Good  CLINICAL DECISION MAKING: Stable/uncomplicated  EVALUATION COMPLEXITY: Low   GOALS: Goals reviewed with patient? Yes  SHORT TERM GOALS: Target date: 01/23/2022  Patient will be adherent to HEP at least 3x a week to improve functional strength and balance for better safety at home. Baseline: Doing some exercise from home health 8/22: yes; 9/19: feels confident/indep but states, "not doing as much as I should" (previously met) Goal status: MET  2.  Patient will improve RLE knee extension to 0 to improve knee ROM  for functional tasks such as standing/walking.  Baseline: RLE: -5 degrees in supine 8/22: neutral  Goal status: MET   LONG TERM GOALS: Target date: 05/21/2022  Patient will increase 10 meter walk test to >1.8ms as to improve gait speed for better community ambulation and to reduce fall risk. Baseline: 0.98 m/s 8/22: 7.72 seconds =1.29 m/s  Goal status: MET  2.  Patient will tolerate 5 seconds of single leg stance without loss of balance to improve ability to get in and out of shower safely. Baseline: RLE: 3-5 sec inconsistent 8/22: 5 seconds Goal status: MET  3.   Patient will be independent with ascend/descend 12 steps using single UE in step over step pattern without LOB to safely get up/down stairs in home. Baseline: Not formally assessed, negotiates one step at a time; limits steps when possible 8/22: reciprocal ascending; step to descending; 9/19:able to perform recip pattern ascending/descending  Goal status: MET  4.  Pt will improve RLE knee flexion to >115 degrees for functional ROM for transfers and step negotiation;  Baseline: RLE: 108 degrees 8/22: 125  Goal status: MET  5.  Patient (> 615years old) will complete five times sit to stand test in < 15 seconds indicating an increased LE strength and improved balance. Baseline: 19.95 sec 8/22: 15.65 seconds; 9/19: 14.5 sec hands-free Goal status: MET  6.  Patient will improve FOTO score by 5% to indicate improved functional mobility with ADLs.  Baseline: 46% 8/22: 55%; 9/19: 57% Goal status: MET   7. Patient will increase six minute walk test distance to >1300 for improved functional capacity and gait ability. Baseline: 9/19: 1190 ft with one rest break due to fatigue, rates completing test as difficult 05/09/22:  1210 ft with no rest breaks Goal status: PROGRESSING    8. Patient will report a worst R LE pain as no greater than 1/10 on VAS in order to improve tolerance with ADLs and reduced symptoms with  activities. Baseline: 9/19: pain still greater than 3-4/10. 05/09/22:  5/10 pain in the R LE at its worst over the past 7 days Goal status: PROGRESSING  9.  Patient to report   PLAN: PT FREQUENCY: 2x/week  PT DURATION: 12 weeks  PLANNED INTERVENTIONS: Therapeutic exercises, Therapeutic activity, Neuromuscular re-education, Balance training, Gait training, Patient/Family education, Self Care, Joint mobilization, Stair training, Dry Needling, Cryotherapy, Moist heat, scar mobilization, Taping, and Manual therapy  PLAN FOR NEXT SESSION:   circuit/interval training, work on ROM/strengthening, dry needling, endurance, continue plan   JGwenlyn Saran PT, DPT Physical Therapist- [facility] 05/23/22, 5:22 PM

## 2022-05-24 NOTE — Addendum Note (Signed)
Addended by: Christie Nottingham on: 05/24/2022 11:20 AM   Modules accepted: Orders

## 2022-05-28 ENCOUNTER — Encounter: Payer: Medicare Other | Admitting: Physical Therapy

## 2022-05-30 ENCOUNTER — Ambulatory Visit: Payer: Medicare Other

## 2022-05-30 DIAGNOSIS — R262 Difficulty in walking, not elsewhere classified: Secondary | ICD-10-CM | POA: Diagnosis not present

## 2022-05-30 DIAGNOSIS — R269 Unspecified abnormalities of gait and mobility: Secondary | ICD-10-CM

## 2022-05-30 DIAGNOSIS — M5459 Other low back pain: Secondary | ICD-10-CM

## 2022-05-30 DIAGNOSIS — R278 Other lack of coordination: Secondary | ICD-10-CM

## 2022-05-30 DIAGNOSIS — R2689 Other abnormalities of gait and mobility: Secondary | ICD-10-CM

## 2022-05-30 DIAGNOSIS — M6281 Muscle weakness (generalized): Secondary | ICD-10-CM

## 2022-05-30 DIAGNOSIS — M25561 Pain in right knee: Secondary | ICD-10-CM

## 2022-05-30 DIAGNOSIS — R2681 Unsteadiness on feet: Secondary | ICD-10-CM

## 2022-05-30 DIAGNOSIS — M25661 Stiffness of right knee, not elsewhere classified: Secondary | ICD-10-CM

## 2022-05-30 NOTE — Therapy (Signed)
OUTPATIENT PHYSICAL THERAPY LOWER EXTREMITY TREATMENT   Patient Name: Leslie Smith MRN: [id number] DOB:April 23, 1951, 71 y.o., female Today's Date: 05/30/2022   PT End of Session - 05/30/22 0932     Visit Number 23    Number of Visits 38    Date for PT Re-Evaluation 07/04/22    Authorization Type Medicare Parts A & B    Authorization Time Period 12/25/21-02/20/22; 05/23/22-07/04/22    Progress Note Due on Visit 20    PT Start Time 0932    PT Stop Time 1015    PT Time Calculation (min) 43 min    Equipment Utilized During Treatment Gait belt    Activity Tolerance Patient tolerated treatment well    Behavior During Therapy WFL for tasks assessed/performed             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[place]) 09/15/2014   Bladder incontinence    Cancer ([place]) 2014   brain tumor, resected   Cellulitis and abscess of leg 2019   2 episodes this year   Complication of anesthesia    DJD (degenerative joint disease) of knee 2018   Fall    Fatigue    History of blood transfusion    Hypertension    MS (multiple sclerosis) ([place])    Myelitis ([place])    Neuromuscular disorder ([place]) 2001   multiple sclerosis diagnosed by MRI   PONV (postoperative nausea and vomiting)    severe   Thrombocytopenia ([place])    Past Surgical History:  Procedure Laterality Date   ABDOMINAL HYSTERECTOMY  2011   required wound vac for 6 weeks. ovaries had grown attached to her back bone   BRAIN SURGERY  2015   tumor resection-denies seizures   BREAST SURGERY Left 2014   biopsy. negative   HARDWARE REMOVAL Left 05/05/2018   Procedure: HARDWARE REMOVAL- LEFT ANKLE;  Surgeon: Hessie Knows, MD;  Location: [facility];  Service: Orthopedics;  Laterality: Left;   I & D EXTREMITY Left 08/25/2015   Procedure: IRRIGATION AND DEBRIDEMENT THUMB;  Surgeon: Iran Planas, MD;  Location: [place];  Service: Orthopedics;  Laterality: Left;   ORIF ANKLE FRACTURE Left 02/20/2018   Procedure: OPEN  REDUCTION INTERNAL FIXATION (ORIF) ANKLE FRACTURE;  Surgeon: Hessie Knows, MD;  Location: [facility];  Service: Orthopedics;  Laterality: Left;   TOTAL KNEE ARTHROPLASTY Right 11/21/2021   Procedure: TOTAL KNEE ARTHROPLASTY;  Surgeon: Lovell Sheehan, MD;  Location: [facility];  Service: Orthopedics;  Laterality: Right;   Patient Active Problem List   Diagnosis Date Noted   S/P TKR (total knee replacement) using cement, right 11/21/2021   Atherosclerosis of abdominal aorta ([place]) 01/10/2021   Coronary artery disease involving native coronary artery of native heart 01/10/2021   OSA (obstructive sleep apnea) 04/19/2020   Statin intolerance 01/14/2020   S/P ORIF (open reduction internal fixation) fracture 02/20/2018   DJD (degenerative joint disease) of knee 01/02/2017   Advanced care planning/counseling discussion 01/02/2017   Thrombocytopenia ([place]) 10/30/2015   Multiple sclerosis ([place])    Essential hypertension    Hyperlipidemia    Benign neoplasm of brain ([place]) 04/30/2013   Cerebral meningioma ([place]) 04/29/2013   Malignant hypertension 04/29/2013   Obesity 04/15/2013    PCP: Fulton Reek, MD  REFERRING PROVIDER: Kurtis Bushman MD  REFERRING DIAG: R TKA, 11/21/21  THERAPY DIAG:  Difficulty in walking, not elsewhere classified  Muscle weakness (generalized)  Stiffness of right knee, not elsewhere classified  Other low  back pain  Chronic pain of right knee  Other abnormalities of gait and mobility  Other lack of coordination  Abnormality of gait and mobility  Unsteadiness on feet  Rationale for Evaluation and Treatment Rehabilitation  ONSET DATE: 11/21/21  SUBJECTIVE:   SUBJECTIVE STATEMENT:   Pt reports that she feels like she has an onset of gout in her feet.  She believes it may have come from the ham that she bought.  She states she's usually better about reading the ingredients, but she must have slipped up and got one with increased nitrates that cause her gout to  flare up.   Are you having pain? Yes: NPRS scale: 5/10 Pain location: Back, butt, tender in the feet Pain description: tenderness   PERTINENT HISTORY:  71 yo Female s/p R TKA on 11/21/21. Patient reports having home health PT for 2-3 weeks. She reports doing well. She presents to therapy without AD. She reports not needing AD at home. She has been sleeping with CPAP and cryocuff on RLE- but has been sleeping well; PMH significant: MS, Sleep apnea, 2014 brain tumor, history of LE cellulitis, intermittent fluid build up/swelling in LE, DJD, HTN, Thrombocytopenia; Pt is followed by PCP regarding LE swelling; Pt  back on BP medications.    PRECAUTIONS: Fall Risk  WEIGHT BEARING RESTRICTIONS No  FALLS:  Has patient fallen in last 6 months? Yes. Number of falls 1  LIVING ENVIRONMENT: Lives with: lives alone Lives in: House/apartment Stairs: Yes: Internal: 13 steps; on right going up and External: 3 steps; has ramp Has following equipment at home: Single point cane, Walker - 2 wheeled, Environmental consultant - 4 wheeled, Crutches, bed side commode, and Grab bars  OCCUPATION: Retired Emergency planning/management officer  PLOF: Independent with basic ADLs, Independent with gait, and Independent with transfers   [place] transfers, increase endurance/activity tolerance, Improve strength Pt would like to be able to clean her house and be more active at home.     TODAY'S TREATMENT:  TherEx:  Supine lower trunk rotation, 2x10 each side Supine hamstring curls with green physioball, 2x10 Supine LTR with green physioball under LE's, 2x10 each side Hooklying hip adduction into rainbow physioball, 2x10 Hooklying bridges with physioball between knee for hip adduction, 2x10 Hooklying knee extension with physioball between knees for increased hip adduction, 2x10 each LE    Neuro: Round 1: Korebalance T[employer] Racer, practice level, 3:57 time with 44 herring caught Round 2: [employer], practice level, 3:48  time with 57 herring caught  Mulitple attempts with other practice sessions noting increased ability to perform weight shifts without any increased difficulty and improved balance as progressed.  Pt attempted 3 other modes and succeeded in 2/3 practice modes.  PATIENT EDUCATION:  Education details:  Pt educated throughout session about proper posture and technique with exercises. Improved exercise technique, movement at target joints, use of target muscles after min to mod verbal, visual, tactile cues.  Person educated: Patient Education method: Explanation, Demonstration, and Verbal cues Education comprehension: verbalized understanding, returned demonstration, verbal cues required, and needs further education   HOME EXERCISE PROGRAM:  No updates today. Pt to continue HEP as previously given: Continue as given from home health PT Reinforced importance of ROM including quad sets and LAQ/knee flexion;   ASSESSMENT:  CLINICAL IMPRESSION:  Pt noted improved performance with supine exercises and is demonstrating improved TrA activation with activities and showing increased strength in LE's as well.  Pt also much more confident on Korebalance trainer to  assist with improved weight shifts and balance related tasks.   Pt will continue to benefit from skilled therapy to address remaining deficits in order to improve overall QoL and return to PLOF.      OBJECTIVE IMPAIRMENTS Abnormal gait, cardiopulmonary status limiting activity, decreased activity tolerance, decreased balance, decreased endurance, decreased mobility, difficulty walking, decreased ROM, decreased strength, hypomobility, impaired flexibility, and pain.   ACTIVITY LIMITATIONS bending, standing, squatting, stairs, transfers, and locomotion level  PARTICIPATION LIMITATIONS: cleaning, laundry, shopping, community activity, occupation, yard work, and church  PERSONAL FACTORS Age, Fitness, and 3+ comorbidities: MS, Sleep apnea, 2014  brain tumor, history of LE cellulitis, intermittent fluid build up/swelling in LE, DJD, HTN, Thrombocytopenia;   are also affecting patient's functional outcome.   REHAB POTENTIAL: Good  CLINICAL DECISION MAKING: Stable/uncomplicated  EVALUATION COMPLEXITY: Low   GOALS: Goals reviewed with patient? Yes  SHORT TERM GOALS: Target date: 01/23/2022  Patient will be adherent to HEP at least 3x a week to improve functional strength and balance for better safety at home. Baseline: Doing some exercise from home health 8/22: yes; 9/19: feels confident/indep but states, "not doing as much as I should" (previously met) Goal status: MET  2.  Patient will improve RLE knee extension to 0 to improve knee ROM for functional tasks such as standing/walking.  Baseline: RLE: -5 degrees in supine 8/22: neutral  Goal status: MET   LONG TERM GOALS: Target date: 05/21/2022  Patient will increase 10 meter walk test to >1.80ms as to improve gait speed for better community ambulation and to reduce fall risk. Baseline: 0.98 m/s 8/22: 7.72 seconds =1.29 m/s  Goal status: MET  2.  Patient will tolerate 5 seconds of single leg stance without loss of balance to improve ability to get in and out of shower safely. Baseline: RLE: 3-5 sec inconsistent 8/22: 5 seconds Goal status: MET  3.   Patient will be independent with ascend/descend 12 steps using single UE in step over step pattern without LOB to safely get up/down stairs in home. Baseline: Not formally assessed, negotiates one step at a time; limits steps when possible 8/22: reciprocal ascending; step to descending; 9/19:able to perform recip pattern ascending/descending  Goal status: MET  4.  Pt will improve RLE knee flexion to >115 degrees for functional ROM for transfers and step negotiation;  Baseline: RLE: 108 degrees 8/22: 125  Goal status: MET  5.  Patient (> 678years old) will complete five times sit to stand test in < 15 seconds indicating an  increased LE strength and improved balance. Baseline: 19.95 sec 8/22: 15.65 seconds; 9/19: 14.5 sec hands-free Goal status: MET  6.  Patient will improve FOTO score by 5% to indicate improved functional mobility with ADLs.  Baseline: 46% 8/22: 55%; 9/19: 57% Goal status: MET   7. Patient will increase six minute walk test distance to >1300 for improved functional capacity and gait ability. Baseline: 9/19: 1190 ft with one rest break due to fatigue, rates completing test as difficult 05/09/22:  1210 ft with no rest breaks Goal status: PROGRESSING    8. Patient will report a worst R LE pain as no greater than 1/10 on VAS in order to improve tolerance with ADLs and reduced symptoms with activities. Baseline: 9/19: pain still greater than 3-4/10. 05/09/22:  5/10 pain in the R LE at its worst over the past 7 days Goal status: PROGRESSING  9.  Patient to report   PLAN: PT FREQUENCY: 2x/week  PT DURATION: 12  weeks  PLANNED INTERVENTIONS: Therapeutic exercises, Therapeutic activity, Neuromuscular re-education, Balance training, Gait training, Patient/Family education, Self Care, Joint mobilization, Stair training, Dry Needling, Cryotherapy, Moist heat, scar mobilization, Taping, and Manual therapy  PLAN FOR NEXT SESSION:   circuit/interval training, work on ROM/strengthening, dry needling, endurance, continue plan   Gwenlyn Saran, PT, DPT Physical Therapist- [facility]  05/30/22, 1:33 PM

## 2022-06-06 ENCOUNTER — Ambulatory Visit: Payer: Medicare Other

## 2022-06-12 NOTE — Therapy (Signed)
OUTPATIENT PHYSICAL THERAPY LOWER EXTREMITY TREATMENT   Patient Name: Leslie Smith MRN: [id number] DOB:1950-12-26, 72 y.o., female Today's Date: 06/13/2022   PT End of Session - 06/13/22 0802     Visit Number 24    Number of Visits 59    Date for PT Re-Evaluation 07/04/22    Authorization Type Medicare Parts A & B    Authorization Time Period 12/25/21-02/20/22; 05/23/22-07/04/22    Progress Note Due on Visit 20    PT Start Time 0801    PT Stop Time 0845    PT Time Calculation (min) 44 min    Equipment Utilized During Treatment Gait belt    Activity Tolerance Patient tolerated treatment well    Behavior During Therapy WFL for tasks assessed/performed              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[place]) 09/15/2014   Bladder incontinence    Cancer ([place]) 2014   brain tumor, resected   Cellulitis and abscess of leg 2019   2 episodes this year   Complication of anesthesia    DJD (degenerative joint disease) of knee 2018   Fall    Fatigue    History of blood transfusion    Hypertension    MS (multiple sclerosis) ([place])    Myelitis ([place])    Neuromuscular disorder ([place]) 2001   multiple sclerosis diagnosed by MRI   PONV (postoperative nausea and vomiting)    severe   Thrombocytopenia ([place])    Past Surgical History:  Procedure Laterality Date   ABDOMINAL HYSTERECTOMY  2011   required wound vac for 6 weeks. ovaries had grown attached to her back bone   BRAIN SURGERY  2015   tumor resection-denies seizures   BREAST SURGERY Left 2014   biopsy. negative   HARDWARE REMOVAL Left 05/05/2018   Procedure: HARDWARE REMOVAL- LEFT ANKLE;  Surgeon: Hessie Knows, MD;  Location: [facility];  Service: Orthopedics;  Laterality: Left;   I & D EXTREMITY Left 08/25/2015   Procedure: IRRIGATION AND DEBRIDEMENT THUMB;  Surgeon: Iran Planas, MD;  Location: [place];  Service: Orthopedics;  Laterality: Left;   ORIF ANKLE FRACTURE Left 02/20/2018   Procedure: OPEN  REDUCTION INTERNAL FIXATION (ORIF) ANKLE FRACTURE;  Surgeon: Hessie Knows, MD;  Location: [facility];  Service: Orthopedics;  Laterality: Left;   TOTAL KNEE ARTHROPLASTY Right 11/21/2021   Procedure: TOTAL KNEE ARTHROPLASTY;  Surgeon: Lovell Sheehan, MD;  Location: [facility];  Service: Orthopedics;  Laterality: Right;   Patient Active Problem List   Diagnosis Date Noted   S/P TKR (total knee replacement) using cement, right 11/21/2021   Atherosclerosis of abdominal aorta ([place]) 01/10/2021   Coronary artery disease involving native coronary artery of native heart 01/10/2021   OSA (obstructive sleep apnea) 04/19/2020   Statin intolerance 01/14/2020   S/P ORIF (open reduction internal fixation) fracture 02/20/2018   DJD (degenerative joint disease) of knee 01/02/2017   Advanced care planning/counseling discussion 01/02/2017   Thrombocytopenia ([place]) 10/30/2015   Multiple sclerosis ([place])    Essential hypertension    Hyperlipidemia    Benign neoplasm of brain ([place]) 04/30/2013   Cerebral meningioma ([place]) 04/29/2013   Malignant hypertension 04/29/2013   Obesity 04/15/2013    PCP: Fulton Reek, MD  REFERRING PROVIDER: Kurtis Bushman MD  REFERRING DIAG: R TKA, 11/21/21  THERAPY DIAG:  Difficulty in walking, not elsewhere classified  Muscle weakness (generalized)  Stiffness of right knee, not elsewhere classified  Other  low back pain  Chronic pain of right knee  Other abnormalities of gait and mobility  Other lack of coordination  Abnormality of gait and mobility  Unsteadiness on feet  Rationale for Evaluation and Treatment Rehabilitation  ONSET DATE: 11/21/21  SUBJECTIVE:   SUBJECTIVE STATEMENT:   Pt reports she had a good Christmas and Holiday.  She notes she is still having difficulty in her feet with the gout.  Pt notes her pain was a lot worse once she woke up, but it has backed off since she has moved around more.   Are you having pain? Yes: NPRS scale: 5/10 Pain  location: Feet Pain description: tenderness   PERTINENT HISTORY:  72 yo Female s/p R TKA on 11/21/21. Patient reports having home health PT for 2-3 weeks. She reports doing well. She presents to therapy without AD. She reports not needing AD at home. She has been sleeping with CPAP and cryocuff on RLE- but has been sleeping well; PMH significant: MS, Sleep apnea, 2014 brain tumor, history of LE cellulitis, intermittent fluid build up/swelling in LE, DJD, HTN, Thrombocytopenia; Pt is followed by PCP regarding LE swelling; Pt  back on BP medications.    PRECAUTIONS: Fall Risk  WEIGHT BEARING RESTRICTIONS No  FALLS:  Has patient fallen in last 6 months? Yes. Number of falls 1  LIVING ENVIRONMENT: Lives with: lives alone Lives in: House/apartment Stairs: Yes: Internal: 13 steps; on right going up and External: 3 steps; has ramp Has following equipment at home: Single point cane, Walker - 2 wheeled, Environmental consultant - 4 wheeled, Crutches, bed side commode, and Grab bars  OCCUPATION: Retired Emergency planning/management officer  PLOF: Independent with basic ADLs, Independent with gait, and Independent with transfers   [place] transfers, increase endurance/activity tolerance, Improve strength Pt would like to be able to clean her house and be more active at home.     TODAY'S TREATMENT:  TherEx:  Supine lower trunk rotation, 2x10 each side Supine hamstring curls with green physioball, 2x10 Supine LTR with green physioball under LE's, 2x10 each side Hooklying hip adduction into rainbow physioball, 3 sec holds, 2x10 Hooklying bridges with physioball between knee for hip adduction, 2x10 Hooklying knee extension with physioball between knees for increased hip adduction, 2x10 each LE Step up onto 6" step, alternating LE's stepping up, 2x10 each LE   Neuro:  Lunges onto BOSU ball (round side up) to improve proprioception of the ankle musculature, 2x10 each LE leading Static stance on airex pad, 30 sec  bout Static stance with horizontal/vertical head nods, 30 sec bouts x2 each direction Static narrow stance on airex pad, 30 sec bouts Static narrow stance on airex pad with eyes closed, 30 sec bouts x2    PATIENT EDUCATION:  Education details:  Pt educated throughout session about proper posture and technique with exercises. Improved exercise technique, movement at target joints, use of target muscles after min to mod verbal, visual, tactile cues.  Person educated: Patient Education method: Explanation, Demonstration, and Verbal cues Education comprehension: verbalized understanding, returned demonstration, verbal cues required, and needs further education   HOME EXERCISE PROGRAM:  No updates today. Pt to continue HEP as previously given: Continue as given from home health PT Reinforced importance of ROM including quad sets and LAQ/knee flexion;   ASSESSMENT:  CLINICAL IMPRESSION:  Pt performed well and put forth great effort throughout the session although her feet are still bothering her.  Pt has new medication and is hopeful that by next visit she  will feel much better.  Pt able to perform all activities without complication however and is making good improvement with balance related tasks.  Pt still has difficulty when performing tasks with eyes closed, and will continue to improve on that deficit going forward.   Pt will continue to benefit from skilled therapy to address remaining deficits in order to improve overall QoL and return to PLOF.       OBJECTIVE IMPAIRMENTS Abnormal gait, cardiopulmonary status limiting activity, decreased activity tolerance, decreased balance, decreased endurance, decreased mobility, difficulty walking, decreased ROM, decreased strength, hypomobility, impaired flexibility, and pain.   ACTIVITY LIMITATIONS bending, standing, squatting, stairs, transfers, and locomotion level  PARTICIPATION LIMITATIONS: cleaning, laundry, shopping, community activity,  occupation, yard work, and church  PERSONAL FACTORS Age, Fitness, and 3+ comorbidities: MS, Sleep apnea, 2014 brain tumor, history of LE cellulitis, intermittent fluid build up/swelling in LE, DJD, HTN, Thrombocytopenia;   are also affecting patient's functional outcome.   REHAB POTENTIAL: Good  CLINICAL DECISION MAKING: Stable/uncomplicated  EVALUATION COMPLEXITY: Low   GOALS: Goals reviewed with patient? Yes  SHORT TERM GOALS: Target date: 01/23/2022  Patient will be adherent to HEP at least 3x a week to improve functional strength and balance for better safety at home. Baseline: Doing some exercise from home health 8/22: yes; 9/19: feels confident/indep but states, "not doing as much as I should" (previously met) Goal status: MET  2.  Patient will improve RLE knee extension to 0 to improve knee ROM for functional tasks such as standing/walking.  Baseline: RLE: -5 degrees in supine 8/22: neutral  Goal status: MET   LONG TERM GOALS: Target date: 05/21/2022  Patient will increase 10 meter walk test to >1.48ms as to improve gait speed for better community ambulation and to reduce fall risk. Baseline: 0.98 m/s 8/22: 7.72 seconds =1.29 m/s  Goal status: MET  2.  Patient will tolerate 5 seconds of single leg stance without loss of balance to improve ability to get in and out of shower safely. Baseline: RLE: 3-5 sec inconsistent 8/22: 5 seconds Goal status: MET  3.   Patient will be independent with ascend/descend 12 steps using single UE in step over step pattern without LOB to safely get up/down stairs in home. Baseline: Not formally assessed, negotiates one step at a time; limits steps when possible 8/22: reciprocal ascending; step to descending; 9/19:able to perform recip pattern ascending/descending  Goal status: MET  4.  Pt will improve RLE knee flexion to >115 degrees for functional ROM for transfers and step negotiation;  Baseline: RLE: 108 degrees 8/22: 125  Goal status:  MET  5.  Patient (> 622years old) will complete five times sit to stand test in < 15 seconds indicating an increased LE strength and improved balance. Baseline: 19.95 sec 8/22: 15.65 seconds; 9/19: 14.5 sec hands-free Goal status: MET  6.  Patient will improve FOTO score by 5% to indicate improved functional mobility with ADLs.  Baseline: 46% 8/22: 55%; 9/19: 57% Goal status: MET   7. Patient will increase six minute walk test distance to >1300 for improved functional capacity and gait ability. Baseline: 9/19: 1190 ft with one rest break due to fatigue, rates completing test as difficult 05/09/22:  1210 ft with no rest breaks Goal status: PROGRESSING    8. Patient will report a worst R LE pain as no greater than 1/10 on VAS in order to improve tolerance with ADLs and reduced symptoms with activities. Baseline: 9/19: pain still greater than  3-4/10. 05/09/22:  5/10 pain in the R LE at its worst over the past 7 days Goal status: PROGRESSING  9.  Patient to report   PLAN: PT FREQUENCY: 2x/week  PT DURATION: 12 weeks  PLANNED INTERVENTIONS: Therapeutic exercises, Therapeutic activity, Neuromuscular re-education, Balance training, Gait training, Patient/Family education, Self Care, Joint mobilization, Stair training, Dry Needling, Cryotherapy, Moist heat, scar mobilization, Taping, and Manual therapy  PLAN FOR NEXT SESSION:   circuit/interval training, work on ROM/strengthening, dry needling, endurance, continue plan   Gwenlyn Saran, PT, DPT Physical Therapist- [facility]  06/13/22, 8:53 AM

## 2022-06-13 ENCOUNTER — Ambulatory Visit: Payer: Medicare Other | Attending: Orthopedic Surgery

## 2022-06-13 DIAGNOSIS — R2681 Unsteadiness on feet: Secondary | ICD-10-CM | POA: Insufficient documentation

## 2022-06-13 DIAGNOSIS — R2689 Other abnormalities of gait and mobility: Secondary | ICD-10-CM | POA: Insufficient documentation

## 2022-06-13 DIAGNOSIS — M25561 Pain in right knee: Secondary | ICD-10-CM | POA: Diagnosis present

## 2022-06-13 DIAGNOSIS — M5459 Other low back pain: Secondary | ICD-10-CM | POA: Diagnosis present

## 2022-06-13 DIAGNOSIS — R262 Difficulty in walking, not elsewhere classified: Secondary | ICD-10-CM | POA: Insufficient documentation

## 2022-06-13 DIAGNOSIS — R278 Other lack of coordination: Secondary | ICD-10-CM | POA: Insufficient documentation

## 2022-06-13 DIAGNOSIS — M25661 Stiffness of right knee, not elsewhere classified: Secondary | ICD-10-CM | POA: Diagnosis present

## 2022-06-13 DIAGNOSIS — R269 Unspecified abnormalities of gait and mobility: Secondary | ICD-10-CM | POA: Diagnosis present

## 2022-06-13 DIAGNOSIS — M6281 Muscle weakness (generalized): Secondary | ICD-10-CM | POA: Diagnosis present

## 2022-06-20 ENCOUNTER — Ambulatory Visit: Payer: Medicare Other

## 2022-06-20 DIAGNOSIS — R269 Unspecified abnormalities of gait and mobility: Secondary | ICD-10-CM

## 2022-06-20 DIAGNOSIS — M25561 Pain in right knee: Secondary | ICD-10-CM

## 2022-06-20 DIAGNOSIS — M6281 Muscle weakness (generalized): Secondary | ICD-10-CM

## 2022-06-20 DIAGNOSIS — R262 Difficulty in walking, not elsewhere classified: Secondary | ICD-10-CM | POA: Diagnosis not present

## 2022-06-20 DIAGNOSIS — M5459 Other low back pain: Secondary | ICD-10-CM

## 2022-06-20 DIAGNOSIS — M25661 Stiffness of right knee, not elsewhere classified: Secondary | ICD-10-CM

## 2022-06-20 DIAGNOSIS — R278 Other lack of coordination: Secondary | ICD-10-CM

## 2022-06-20 DIAGNOSIS — R2681 Unsteadiness on feet: Secondary | ICD-10-CM

## 2022-06-20 DIAGNOSIS — R2689 Other abnormalities of gait and mobility: Secondary | ICD-10-CM

## 2022-06-20 NOTE — Therapy (Addendum)
OUTPATIENT PHYSICAL THERAPY LOWER EXTREMITY TREATMENT   Patient Name: Leslie Smith MRN: [id number] DOB:06-Nov-1950, 72 y.o., female Today's Date: 06/20/2022   PT End of Session - 06/20/22 0814     Visit Number 25    Number of Visits 76    Date for PT Re-Evaluation 07/04/22    Authorization Type Medicare Parts A & B    Authorization Time Period 12/25/21-02/20/22; 05/23/22-07/04/22    Progress Note Due on Visit 20    PT Start Time 0810    PT Stop Time 0845    PT Time Calculation (min) 35 min    Equipment Utilized During Treatment Gait belt    Activity Tolerance Patient tolerated treatment well    Behavior During Therapy [facility] for tasks assessed/performed               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[place]) 09/15/2014   Bladder incontinence    Cancer ([place]) 2014   brain tumor, resected   Cellulitis and abscess of leg 2019   2 episodes this year   Complication of anesthesia    DJD (degenerative joint disease) of knee 2018   Fall    Fatigue    History of blood transfusion    Hypertension    MS (multiple sclerosis) ([place])    Myelitis ([place])    Neuromuscular disorder ([place]) 2001   multiple sclerosis diagnosed by MRI   PONV (postoperative nausea and vomiting)    severe   Thrombocytopenia ([place])    Past Surgical History:  Procedure Laterality Date   ABDOMINAL HYSTERECTOMY  2011   required wound vac for 6 weeks. ovaries had grown attached to her back bone   BRAIN SURGERY  2015   tumor resection-denies seizures   BREAST SURGERY Left 2014   biopsy. negative   HARDWARE REMOVAL Left 05/05/2018   Procedure: HARDWARE REMOVAL- LEFT ANKLE;  Surgeon: Hessie Knows, MD;  Location: [facility];  Service: Orthopedics;  Laterality: Left;   I & D EXTREMITY Left 08/25/2015   Procedure: IRRIGATION AND DEBRIDEMENT THUMB;  Surgeon: Iran Planas, MD;  Location: [place];  Service: Orthopedics;  Laterality: Left;   ORIF ANKLE FRACTURE Left 02/20/2018   Procedure: OPEN  REDUCTION INTERNAL FIXATION (ORIF) ANKLE FRACTURE;  Surgeon: Hessie Knows, MD;  Location: [facility];  Service: Orthopedics;  Laterality: Left;   TOTAL KNEE ARTHROPLASTY Right 11/21/2021   Procedure: TOTAL KNEE ARTHROPLASTY;  Surgeon: Lovell Sheehan, MD;  Location: [facility];  Service: Orthopedics;  Laterality: Right;   Patient Active Problem List   Diagnosis Date Noted   S/P TKR (total knee replacement) using cement, right 11/21/2021   Atherosclerosis of abdominal aorta ([place]) 01/10/2021   Coronary artery disease involving native coronary artery of native heart 01/10/2021   OSA (obstructive sleep apnea) 04/19/2020   Statin intolerance 01/14/2020   S/P ORIF (open reduction internal fixation) fracture 02/20/2018   DJD (degenerative joint disease) of knee 01/02/2017   Advanced care planning/counseling discussion 01/02/2017   Thrombocytopenia ([place]) 10/30/2015   Multiple sclerosis ([place])    Essential hypertension    Hyperlipidemia    Benign neoplasm of brain ([place]) 04/30/2013   Cerebral meningioma ([place]) 04/29/2013   Malignant hypertension 04/29/2013   Obesity 04/15/2013    PCP: Fulton Reek, MD  REFERRING PROVIDER: Kurtis Bushman MD  REFERRING DIAG: R TKA, 11/21/21  THERAPY DIAG:  Difficulty in walking, not elsewhere classified  Stiffness of right knee, not elsewhere classified  Muscle weakness (generalized)  Other low back pain  Chronic pain of right knee  Other abnormalities of gait and mobility  Other lack of coordination  Abnormality of gait and mobility  Unsteadiness on feet  Rationale for Evaluation and Treatment Rehabilitation  ONSET DATE: 11/21/21  SUBJECTIVE:   SUBJECTIVE STATEMENT:   Pt reports she was helping out at her church and doing a fundraiser to raise money.  Pt notes the new fellowship hall is on concrete and that was difficult on her back walking on that floor.  Are you having pain? Yes: NPRS scale: 4/10 Pain location: Knees & Back Pain  description: tenderness   PERTINENT HISTORY:  72 yo Female s/p R TKA on 11/21/21. Patient reports having home health PT for 2-3 weeks. She reports doing well. She presents to therapy without AD. She reports not needing AD at home. She has been sleeping with CPAP and cryocuff on RLE- but has been sleeping well; PMH significant: MS, Sleep apnea, 2014 brain tumor, history of LE cellulitis, intermittent fluid build up/swelling in LE, DJD, HTN, Thrombocytopenia; Pt is followed by PCP regarding LE swelling; Pt  back on BP medications.    PRECAUTIONS: Fall Risk  WEIGHT BEARING RESTRICTIONS No  FALLS:  Has patient fallen in last 6 months? Yes. Number of falls 1  LIVING ENVIRONMENT: Lives with: lives alone Lives in: House/apartment Stairs: Yes: Internal: 13 steps; on right going up and External: 3 steps; has ramp Has following equipment at home: Single point cane, Walker - 2 wheeled, Environmental consultant - 4 wheeled, Crutches, bed side commode, and Grab bars  OCCUPATION: Retired Emergency planning/management officer  PLOF: Independent with basic ADLs, Independent with gait, and Independent with transfers   [place] transfers, increase endurance/activity tolerance, Improve strength Pt would like to be able to clean her house and be more active at home.     TODAY'S TREATMENT:  TherEx:  Supine hamstring curls with green physioball, 2x10 Supine LTR with green physioball under LE's, 2x10 each side Hooklying hip adduction into rainbow physioball, 3 sec holds, 2x10 Hooklying bridges with physioball between knee for hip adduction, 2x10 Hooklying knee extension with physioball between knees for increased hip adduction, 2x10 each LE Step up onto 6" step, alternating LE's stepping up, 2x10 each LE Deadlift with 15# kettlebell from step, 2x15 with verbal cuing for proper form Step up onto 6" step, x10 with each LE leading and UE support applied for balance purposes  MHP applied to lumbar spine when performing  supine/hooklying exercises.   PATIENT EDUCATION:  Education details:  Pt educated throughout session about proper posture and technique with exercises. Improved exercise technique, movement at target joints, use of target muscles after min to mod verbal, visual, tactile cues.  Person educated: Patient Education method: Explanation, Demonstration, and Verbal cues Education comprehension: verbalized understanding, returned demonstration, verbal cues required, and needs further education   HOME EXERCISE PROGRAM:  No updates today. Pt to continue HEP as previously given: Continue as given from home health PT Reinforced importance of ROM including quad sets and LAQ/knee flexion;   ASSESSMENT:  CLINICAL IMPRESSION:  Pt is making good progress towards goals and noted a reduction in her overall pain.  MHP allowed for lumbar spine musculature to relax and reduce overall pain in the lumbar region.  Pt performed well with the exercises given and performed well with the dead lifts that were introduced.  Pt did require verbal cues and visual demonstration in order to perform correctly, but has good recall.   Pt  will continue to benefit from skilled therapy to address remaining deficits in order to improve overall QoL and return to PLOF.      OBJECTIVE IMPAIRMENTS Abnormal gait, cardiopulmonary status limiting activity, decreased activity tolerance, decreased balance, decreased endurance, decreased mobility, difficulty walking, decreased ROM, decreased strength, hypomobility, impaired flexibility, and pain.   ACTIVITY LIMITATIONS bending, standing, squatting, stairs, transfers, and locomotion level  PARTICIPATION LIMITATIONS: cleaning, laundry, shopping, community activity, occupation, yard work, and church  PERSONAL FACTORS Age, Fitness, and 3+ comorbidities: MS, Sleep apnea, 2014 brain tumor, history of LE cellulitis, intermittent fluid build up/swelling in LE, DJD, HTN, Thrombocytopenia;   are  also affecting patient's functional outcome.   REHAB POTENTIAL: Good  CLINICAL DECISION MAKING: Stable/uncomplicated  EVALUATION COMPLEXITY: Low   GOALS: Goals reviewed with patient? Yes  SHORT TERM GOALS: Target date: 01/23/2022  Patient will be adherent to HEP at least 3x a week to improve functional strength and balance for better safety at home. Baseline: Doing some exercise from home health 8/22: yes; 9/19: feels confident/indep but states, "not doing as much as I should" (previously met) Goal status: MET  2.  Patient will improve RLE knee extension to 0 to improve knee ROM for functional tasks such as standing/walking.  Baseline: RLE: -5 degrees in supine 8/22: neutral  Goal status: MET   LONG TERM GOALS: Target date: 05/21/2022  Patient will increase 10 meter walk test to >1.73ms as to improve gait speed for better community ambulation and to reduce fall risk. Baseline: 0.98 m/s 8/22: 7.72 seconds =1.29 m/s  Goal status: MET  2.  Patient will tolerate 5 seconds of single leg stance without loss of balance to improve ability to get in and out of shower safely. Baseline: RLE: 3-5 sec inconsistent 8/22: 5 seconds Goal status: MET  3.   Patient will be independent with ascend/descend 12 steps using single UE in step over step pattern without LOB to safely get up/down stairs in home. Baseline: Not formally assessed, negotiates one step at a time; limits steps when possible 8/22: reciprocal ascending; step to descending; 9/19:able to perform recip pattern ascending/descending  Goal status: MET  4.  Pt will improve RLE knee flexion to >115 degrees for functional ROM for transfers and step negotiation;  Baseline: RLE: 108 degrees 8/22: 125  Goal status: MET  5.  Patient (> 639years old) will complete five times sit to stand test in < 15 seconds indicating an increased LE strength and improved balance. Baseline: 19.95 sec 8/22: 15.65 seconds; 9/19: 14.5 sec hands-free Goal  status: MET  6.  Patient will improve FOTO score by 5% to indicate improved functional mobility with ADLs.  Baseline: 46% 8/22: 55%; 9/19: 57% Goal status: MET   7. Patient will increase six minute walk test distance to >1300 for improved functional capacity and gait ability. Baseline: 9/19: 1190 ft with one rest break due to fatigue, rates completing test as difficult 05/09/22:  1210 ft with no rest breaks Goal status: PROGRESSING    8. Patient will report a worst R LE pain as no greater than 1/10 on VAS in order to improve tolerance with ADLs and reduced symptoms with activities. Baseline: 9/19: pain still greater than 3-4/10. 05/09/22:  5/10 pain in the R LE at its worst over the past 7 days Goal status: PROGRESSING  9.  Patient to report   PLAN: PT FREQUENCY: 2x/week  PT DURATION: 12 weeks  PLANNED INTERVENTIONS: Therapeutic exercises, Therapeutic activity, Neuromuscular re-education, Balance training,  Gait training, Patient/Family education, Self Care, Joint mobilization, Stair training, Dry Needling, Cryotherapy, Moist heat, scar mobilization, Taping, and Manual therapy  PLAN FOR NEXT SESSION:   circuit/interval training, work on ROM/strengthening, dry needling, endurance, continue plan   Gwenlyn Saran, PT, DPT Physical Therapist- [facility]  06/20/22, 11:03 AM

## 2022-06-27 ENCOUNTER — Ambulatory Visit: Payer: Medicare Other

## 2022-06-27 DIAGNOSIS — R278 Other lack of coordination: Secondary | ICD-10-CM

## 2022-06-27 DIAGNOSIS — R2689 Other abnormalities of gait and mobility: Secondary | ICD-10-CM

## 2022-06-27 DIAGNOSIS — M5459 Other low back pain: Secondary | ICD-10-CM

## 2022-06-27 DIAGNOSIS — M6281 Muscle weakness (generalized): Secondary | ICD-10-CM

## 2022-06-27 DIAGNOSIS — R2681 Unsteadiness on feet: Secondary | ICD-10-CM

## 2022-06-27 DIAGNOSIS — M25661 Stiffness of right knee, not elsewhere classified: Secondary | ICD-10-CM

## 2022-06-27 DIAGNOSIS — R262 Difficulty in walking, not elsewhere classified: Secondary | ICD-10-CM

## 2022-06-27 DIAGNOSIS — M25561 Pain in right knee: Secondary | ICD-10-CM

## 2022-06-27 DIAGNOSIS — R269 Unspecified abnormalities of gait and mobility: Secondary | ICD-10-CM

## 2022-06-27 NOTE — Therapy (Signed)
OUTPATIENT PHYSICAL THERAPY LOWER EXTREMITY TREATMENT   Patient Name: Leslie Smith MRN: [id number] DOB:May 31, 1951, 72 y.o., female Today's Date: 06/27/2022   PT End of Session - 06/27/22 1514     Visit Number 26    Number of Visits 48    Date for PT Re-Evaluation 07/04/22    Authorization Type Medicare Parts A & B    Authorization Time Period 12/25/21-02/20/22; 05/23/22-07/04/22    Progress Note Due on Visit 20    PT Start Time 1502    PT Stop Time 1556    PT Time Calculation (min) 54 min    Equipment Utilized During Treatment Gait belt    Activity Tolerance Patient tolerated treatment well    Behavior During Therapy WFL for tasks assessed/performed              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[place]) 09/15/2014   Bladder incontinence    Cancer ([place]) 2014   brain tumor, resected   Cellulitis and abscess of leg 2019   2 episodes this year   Complication of anesthesia    DJD (degenerative joint disease) of knee 2018   Fall    Fatigue    History of blood transfusion    Hypertension    MS (multiple sclerosis) ([place])    Myelitis ([place])    Neuromuscular disorder ([place]) 2001   multiple sclerosis diagnosed by MRI   PONV (postoperative nausea and vomiting)    severe   Thrombocytopenia ([place])    Past Surgical History:  Procedure Laterality Date   ABDOMINAL HYSTERECTOMY  2011   required wound vac for 6 weeks. ovaries had grown attached to her back bone   BRAIN SURGERY  2015   tumor resection-denies seizures   BREAST SURGERY Left 2014   biopsy. negative   HARDWARE REMOVAL Left 05/05/2018   Procedure: HARDWARE REMOVAL- LEFT ANKLE;  Surgeon: Hessie Knows, MD;  Location: [facility];  Service: Orthopedics;  Laterality: Left;   I & D EXTREMITY Left 08/25/2015   Procedure: IRRIGATION AND DEBRIDEMENT THUMB;  Surgeon: Iran Planas, MD;  Location: [place];  Service: Orthopedics;  Laterality: Left;   ORIF ANKLE FRACTURE Left 02/20/2018   Procedure: OPEN  REDUCTION INTERNAL FIXATION (ORIF) ANKLE FRACTURE;  Surgeon: Hessie Knows, MD;  Location: [facility];  Service: Orthopedics;  Laterality: Left;   TOTAL KNEE ARTHROPLASTY Right 11/21/2021   Procedure: TOTAL KNEE ARTHROPLASTY;  Surgeon: Lovell Sheehan, MD;  Location: [facility];  Service: Orthopedics;  Laterality: Right;   Patient Active Problem List   Diagnosis Date Noted   S/P TKR (total knee replacement) using cement, right 11/21/2021   Atherosclerosis of abdominal aorta ([place]) 01/10/2021   Coronary artery disease involving native coronary artery of native heart 01/10/2021   OSA (obstructive sleep apnea) 04/19/2020   Statin intolerance 01/14/2020   S/P ORIF (open reduction internal fixation) fracture 02/20/2018   DJD (degenerative joint disease) of knee 01/02/2017   Advanced care planning/counseling discussion 01/02/2017   Thrombocytopenia ([place]) 10/30/2015   Multiple sclerosis ([place])    Essential hypertension    Hyperlipidemia    Benign neoplasm of brain ([place]) 04/30/2013   Cerebral meningioma ([place]) 04/29/2013   Malignant hypertension 04/29/2013   Obesity 04/15/2013    PCP: Fulton Reek, MD  REFERRING PROVIDER: Kurtis Bushman MD  REFERRING DIAG: R TKA, 11/21/21  THERAPY DIAG:  Difficulty in walking, not elsewhere classified  Stiffness of right knee, not elsewhere classified  Muscle weakness (generalized)  Other  low back pain  Chronic pain of right knee  Other abnormalities of gait and mobility  Other lack of coordination  Abnormality of gait and mobility  Unsteadiness on feet  Rationale for Evaluation and Treatment Rehabilitation  ONSET DATE: 11/21/21  SUBJECTIVE:   SUBJECTIVE STATEMENT:   Pt reports no new complaints, but stated there was no way she would have made it this morning at 8AM.     Are you having pain? Yes: NPRS scale: 4/10; pt noted it to be a 9/10 this morning. Pain location: Back Pain description: tenderness   PERTINENT HISTORY:  72 yo Female  s/p R TKA on 11/21/21. Patient reports having home health PT for 2-3 weeks. She reports doing well. She presents to therapy without AD. She reports not needing AD at home. She has been sleeping with CPAP and cryocuff on RLE- but has been sleeping well; PMH significant: MS, Sleep apnea, 2014 brain tumor, history of LE cellulitis, intermittent fluid build up/swelling in LE, DJD, HTN, Thrombocytopenia; Pt is followed by PCP regarding LE swelling; Pt  back on BP medications.    PRECAUTIONS: Fall Risk  WEIGHT BEARING RESTRICTIONS No  FALLS:  Has patient fallen in last 6 months? Yes. Number of falls 1  LIVING ENVIRONMENT: Lives with: lives alone Lives in: House/apartment Stairs: Yes: Internal: 13 steps; on right going up and External: 3 steps; has ramp Has following equipment at home: Single point cane, Walker - 2 wheeled, Environmental consultant - 4 wheeled, Crutches, bed side commode, and Grab bars  OCCUPATION: Retired Emergency planning/management officer  PLOF: Independent with basic ADLs, Independent with gait, and Independent with transfers   [place] transfers, increase endurance/activity tolerance, Improve strength Pt would like to be able to clean her house and be more active at home.     TODAY'S TREATMENT:  TherEx:  NuStep, seat 9, arms 7, interval training for improved cardiovascular performance Level 1 - 1 min Level 3 - 1 min Level 2 - 1 min Level 4 - 1 min Level 3 - 1 min Level 5 - 1 min Level 4 - 1 min Level 6 - 1 min Level 5 - 1 min Level 4 - 1 min Level 3 - 1 min Level 1 - 2 min as cool-off period  Seated hamstring curls with BTB for resistance, 2x10 Seated hip abduction with BTB resistance applied at distal thigh, 2x10 Seated LAQ with 7.5# AW donned, 2x15 each LE Seated marches with 7.5# AW donned, 2x15 each LE  MHP applied to lumbar spine when performing seated exercises   PATIENT EDUCATION:  Education details:  Pt educated throughout session about proper posture and technique with  exercises. Improved exercise technique, movement at target joints, use of target muscles after min to mod verbal, visual, tactile cues.  Person educated: Patient Education method: Explanation, Demonstration, and Verbal cues Education comprehension: verbalized understanding, returned demonstration, verbal cues required, and needs further education   HOME EXERCISE PROGRAM:  No updates today. Pt to continue HEP as previously given: Continue as given from home health PT Reinforced importance of ROM including quad sets and LAQ/knee flexion;   ASSESSMENT:  CLINICAL IMPRESSION:  Pt performed well with the exercises and responded well to the endurance training on the NuStep.  Pt able to achieve greater reps and resistance with seated level exercises as well.  Pt continues to improve upon goals and will continue with current POC.   Pt will continue to benefit from skilled therapy to address remaining deficits in order  to improve overall QoL and return to PLOF.      OBJECTIVE IMPAIRMENTS Abnormal gait, cardiopulmonary status limiting activity, decreased activity tolerance, decreased balance, decreased endurance, decreased mobility, difficulty walking, decreased ROM, decreased strength, hypomobility, impaired flexibility, and pain.   ACTIVITY LIMITATIONS bending, standing, squatting, stairs, transfers, and locomotion level  PARTICIPATION LIMITATIONS: cleaning, laundry, shopping, community activity, occupation, yard work, and church  PERSONAL FACTORS Age, Fitness, and 3+ comorbidities: MS, Sleep apnea, 2014 brain tumor, history of LE cellulitis, intermittent fluid build up/swelling in LE, DJD, HTN, Thrombocytopenia;   are also affecting patient's functional outcome.   REHAB POTENTIAL: Good  CLINICAL DECISION MAKING: Stable/uncomplicated  EVALUATION COMPLEXITY: Low   GOALS: Goals reviewed with patient? Yes  SHORT TERM GOALS: Target date: 01/23/2022  Patient will be adherent to HEP at least  3x a week to improve functional strength and balance for better safety at home. Baseline: Doing some exercise from home health 8/22: yes; 9/19: feels confident/indep but states, "not doing as much as I should" (previously met) Goal status: MET  2.  Patient will improve RLE knee extension to 0 to improve knee ROM for functional tasks such as standing/walking.  Baseline: RLE: -5 degrees in supine 8/22: neutral  Goal status: MET   LONG TERM GOALS: Target date: 05/21/2022  Patient will increase 10 meter walk test to >1.23ms as to improve gait speed for better community ambulation and to reduce fall risk. Baseline: 0.98 m/s 8/22: 7.72 seconds =1.29 m/s  Goal status: MET  2.  Patient will tolerate 5 seconds of single leg stance without loss of balance to improve ability to get in and out of shower safely. Baseline: RLE: 3-5 sec inconsistent 8/22: 5 seconds Goal status: MET  3.   Patient will be independent with ascend/descend 12 steps using single UE in step over step pattern without LOB to safely get up/down stairs in home. Baseline: Not formally assessed, negotiates one step at a time; limits steps when possible 8/22: reciprocal ascending; step to descending; 9/19:able to perform recip pattern ascending/descending  Goal status: MET  4.  Pt will improve RLE knee flexion to >115 degrees for functional ROM for transfers and step negotiation;  Baseline: RLE: 108 degrees 8/22: 125  Goal status: MET  5.  Patient (> 666years old) will complete five times sit to stand test in < 15 seconds indicating an increased LE strength and improved balance. Baseline: 19.95 sec 8/22: 15.65 seconds; 9/19: 14.5 sec hands-free Goal status: MET  6.  Patient will improve FOTO score by 5% to indicate improved functional mobility with ADLs.  Baseline: 46% 8/22: 55%; 9/19: 57% Goal status: MET   7. Patient will increase six minute walk test distance to >1300 for improved functional capacity and gait  ability. Baseline: 9/19: 1190 ft with one rest break due to fatigue, rates completing test as difficult 05/09/22:  1210 ft with no rest breaks Goal status: PROGRESSING    8. Patient will report a worst R LE pain as no greater than 1/10 on VAS in order to improve tolerance with ADLs and reduced symptoms with activities. Baseline: 9/19: pain still greater than 3-4/10. 05/09/22:  5/10 pain in the R LE at its worst over the past 7 days Goal status: PROGRESSING  9.  Patient to report   PLAN: PT FREQUENCY: 2x/week  PT DURATION: 12 weeks  PLANNED INTERVENTIONS: Therapeutic exercises, Therapeutic activity, Neuromuscular re-education, Balance training, Gait training, Patient/Family education, Self Care, Joint mobilization, Stair training, Dry Needling, Cryotherapy,  Moist heat, scar mobilization, Taping, and Manual therapy  PLAN FOR NEXT SESSION:   circuit/interval training, work on ROM/strengthening, dry needling, endurance, continue plan   Gwenlyn Saran, PT, DPT Physical Therapist- [facility]  06/27/22, 4:58 PM

## 2022-07-04 ENCOUNTER — Ambulatory Visit: Payer: Medicare Other

## 2022-07-11 ENCOUNTER — Ambulatory Visit: Payer: Medicare Other | Attending: Internal Medicine

## 2022-07-11 DIAGNOSIS — R278 Other lack of coordination: Secondary | ICD-10-CM | POA: Diagnosis present

## 2022-07-11 DIAGNOSIS — M25561 Pain in right knee: Secondary | ICD-10-CM | POA: Insufficient documentation

## 2022-07-11 DIAGNOSIS — M5459 Other low back pain: Secondary | ICD-10-CM | POA: Diagnosis present

## 2022-07-11 DIAGNOSIS — R2681 Unsteadiness on feet: Secondary | ICD-10-CM | POA: Diagnosis present

## 2022-07-11 DIAGNOSIS — M25661 Stiffness of right knee, not elsewhere classified: Secondary | ICD-10-CM | POA: Diagnosis present

## 2022-07-11 DIAGNOSIS — R262 Difficulty in walking, not elsewhere classified: Secondary | ICD-10-CM | POA: Insufficient documentation

## 2022-07-11 DIAGNOSIS — R269 Unspecified abnormalities of gait and mobility: Secondary | ICD-10-CM | POA: Insufficient documentation

## 2022-07-11 DIAGNOSIS — R2689 Other abnormalities of gait and mobility: Secondary | ICD-10-CM | POA: Diagnosis present

## 2022-07-11 DIAGNOSIS — M6281 Muscle weakness (generalized): Secondary | ICD-10-CM | POA: Diagnosis present

## 2022-07-11 NOTE — Therapy (Signed)
OUTPATIENT PHYSICAL THERAPY LOWER EXTREMITY TREATMENT   Patient Name: Leslie Smith MRN: [id number] DOB:1951-02-10, 72 y.o., female Today's Date: 07/11/2022   PT End of Session - 07/11/22 1355     Visit Number 27    Number of Visits 27    Date for PT Re-Evaluation 10/03/22    Authorization Type Medicare Parts A & B    Authorization Time Period 12/25/21-02/20/22; 05/23/22-07/04/22    Progress Note Due on Visit 20    PT Start Time 1351    PT Stop Time 1430    PT Time Calculation (min) 39 min    Equipment Utilized During Treatment Gait belt    Activity Tolerance Patient tolerated treatment well    Behavior During Therapy WFL for tasks assessed/performed              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[place]) 09/15/2014   Bladder incontinence    Cancer ([place]) 2014   brain tumor, resected   Cellulitis and abscess of leg 2019   2 episodes this year   Complication of anesthesia    DJD (degenerative joint disease) of knee 2018   Fall    Fatigue    History of blood transfusion    Hypertension    MS (multiple sclerosis) ([place])    Myelitis ([place])    Neuromuscular disorder ([place]) 2001   multiple sclerosis diagnosed by MRI   PONV (postoperative nausea and vomiting)    severe   Thrombocytopenia ([place])    Past Surgical History:  Procedure Laterality Date   ABDOMINAL HYSTERECTOMY  2011   required wound vac for 6 weeks. ovaries had grown attached to her back bone   BRAIN SURGERY  2015   tumor resection-denies seizures   BREAST SURGERY Left 2014   biopsy. negative   HARDWARE REMOVAL Left 05/05/2018   Procedure: HARDWARE REMOVAL- LEFT ANKLE;  Surgeon: Hessie Knows, MD;  Location: [facility];  Service: Orthopedics;  Laterality: Left;   I & D EXTREMITY Left 08/25/2015   Procedure: IRRIGATION AND DEBRIDEMENT THUMB;  Surgeon: Iran Planas, MD;  Location: [place];  Service: Orthopedics;  Laterality: Left;   ORIF ANKLE FRACTURE Left 02/20/2018   Procedure: OPEN  REDUCTION INTERNAL FIXATION (ORIF) ANKLE FRACTURE;  Surgeon: Hessie Knows, MD;  Location: [facility];  Service: Orthopedics;  Laterality: Left;   TOTAL KNEE ARTHROPLASTY Right 11/21/2021   Procedure: TOTAL KNEE ARTHROPLASTY;  Surgeon: Lovell Sheehan, MD;  Location: [facility];  Service: Orthopedics;  Laterality: Right;   Patient Active Problem List   Diagnosis Date Noted   S/P TKR (total knee replacement) using cement, right 11/21/2021   Atherosclerosis of abdominal aorta ([place]) 01/10/2021   Coronary artery disease involving native coronary artery of native heart 01/10/2021   OSA (obstructive sleep apnea) 04/19/2020   Statin intolerance 01/14/2020   S/P ORIF (open reduction internal fixation) fracture 02/20/2018   DJD (degenerative joint disease) of knee 01/02/2017   Advanced care planning/counseling discussion 01/02/2017   Thrombocytopenia ([place]) 10/30/2015   Multiple sclerosis ([place])    Essential hypertension    Hyperlipidemia    Benign neoplasm of brain ([place]) 04/30/2013   Cerebral meningioma ([place]) 04/29/2013   Malignant hypertension 04/29/2013   Obesity 04/15/2013    PCP: Fulton Reek, MD  REFERRING PROVIDER: Kurtis Bushman MD  REFERRING DIAG: R TKA, 11/21/21  THERAPY DIAG:  Difficulty in walking, not elsewhere classified  Stiffness of right knee, not elsewhere classified  Muscle weakness (generalized)  Other  low back pain  Chronic pain of right knee  Other abnormalities of gait and mobility  Other lack of coordination  Abnormality of gait and mobility  Unsteadiness on feet  Rationale for Evaluation and Treatment Rehabilitation  ONSET DATE: 11/21/21  SUBJECTIVE:   SUBJECTIVE STATEMENT:   Pt notes she is doing fairly well, but has some swelling in the LE's.  Pt states she feels like she is having flare ups with gout or fluid or both.     Are you having pain? Yes: NPRS scale: 4/10;  Pain location: Tops of the feet Pain description: tenderness   PERTINENT  HISTORY:  72 yo Female s/p R TKA on 11/21/21. Patient reports having home health PT for 2-3 weeks. She reports doing well. She presents to therapy without AD. She reports not needing AD at home. She has been sleeping with CPAP and cryocuff on RLE- but has been sleeping well; PMH significant: MS, Sleep apnea, 2014 brain tumor, history of LE cellulitis, intermittent fluid build up/swelling in LE, DJD, HTN, Thrombocytopenia; Pt is followed by PCP regarding LE swelling; Pt  back on BP medications.    PRECAUTIONS: Fall Risk  WEIGHT BEARING RESTRICTIONS No  FALLS:  Has patient fallen in last 6 months? Yes. Number of falls 1  LIVING ENVIRONMENT: Lives with: lives alone Lives in: House/apartment Stairs: Yes: Internal: 13 steps; on right going up and External: 3 steps; has ramp Has following equipment at home: Single point cane, Walker - 2 wheeled, Environmental consultant - 4 wheeled, Crutches, bed side commode, and Grab bars  OCCUPATION: Retired Emergency planning/management officer  PLOF: Independent with basic ADLs, Independent with gait, and Independent with transfers   [place] transfers, increase endurance/activity tolerance, Improve strength Pt would like to be able to clean her house and be more active at home.     TODAY'S TREATMENT:  TherEx:  Goal assessment performed and noted below as part of re-certification:  Seated hamstring curls with BTB for resistance, 2x10 Seated hip abduction with BTB resistance applied at distal thigh, 2x15 Seated LAQ with 7.5# AW donned, 2x15 each LE Seated marches with 7.5# AW donned, 2x15 each LE  MHP applied to lumbar spine when performing seated exercises   PATIENT EDUCATION:  Education details:  Pt educated throughout session about proper posture and technique with exercises. Improved exercise technique, movement at target joints, use of target muscles after min to mod verbal, visual, tactile cues.  Person educated: Patient Education method: Explanation,  Demonstration, and Verbal cues Education comprehension: verbalized understanding, returned demonstration, verbal cues required, and needs further education   HOME EXERCISE PROGRAM:  No updates today. Pt to continue HEP as previously given: Continue as given from home health PT Reinforced importance of ROM including quad sets and LAQ/knee flexion;   ASSESSMENT:  CLINICAL IMPRESSION:   Pt is improving in her ability to perform exercises with increased resistance.  Pt continues to put forth great effort throughout therapy sessions and is able to tolerate improved techniques when ambulating as well as strengthening her LE's.  Patient's condition has the potential to improve in response to therapy. Maximum improvement is yet to be obtained. The anticipated improvement is attainable and reasonable in a generally predictable time.  Pt will continue to benefit from skilled therapy to address remaining deficits in order to improve overall QoL and return to PLOF.        OBJECTIVE IMPAIRMENTS Abnormal gait, cardiopulmonary status limiting activity, decreased activity tolerance, decreased balance, decreased endurance, decreased mobility, difficulty  walking, decreased ROM, decreased strength, hypomobility, impaired flexibility, and pain.   ACTIVITY LIMITATIONS bending, standing, squatting, stairs, transfers, and locomotion level  PARTICIPATION LIMITATIONS: cleaning, laundry, shopping, community activity, occupation, yard work, and church  PERSONAL FACTORS Age, Fitness, and 3+ comorbidities: MS, Sleep apnea, 2014 brain tumor, history of LE cellulitis, intermittent fluid build up/swelling in LE, DJD, HTN, Thrombocytopenia;   are also affecting patient's functional outcome.   REHAB POTENTIAL: Good  CLINICAL DECISION MAKING: Stable/uncomplicated  EVALUATION COMPLEXITY: Low   GOALS: Goals reviewed with patient? Yes  SHORT TERM GOALS: Target date: 01/23/2022  Patient will be adherent to HEP at  least 3x a week to improve functional strength and balance for better safety at home. Baseline: Doing some exercise from home health 8/22: yes; 9/19: feels confident/indep but states, "not doing as much as I should" (previously met) Goal status: MET  2.  Patient will improve RLE knee extension to 0 to improve knee ROM for functional tasks such as standing/walking.  Baseline: RLE: -5 degrees in supine 8/22: neutral  Goal status: MET   LONG TERM GOALS: Target date: 05/21/2022  Patient will increase 10 meter walk test to >1.36ms as to improve gait speed for better community ambulation and to reduce fall risk. Baseline: 0.98 m/s 8/22: 7.72 seconds =1.29 m/s  Goal status: MET  2.  Patient will tolerate 5 seconds of single leg stance without loss of balance to improve ability to get in and out of shower safely. Baseline: RLE: 3-5 sec inconsistent 8/22: 5 seconds Goal status: MET  3.   Patient will be independent with ascend/descend 12 steps using single UE in step over step pattern without LOB to safely get up/down stairs in home. Baseline: Not formally assessed, negotiates one step at a time; limits steps when possible 8/22: reciprocal ascending; step to descending; 9/19:able to perform recip pattern ascending/descending  Goal status: MET  4.  Pt will improve RLE knee flexion to >115 degrees for functional ROM for transfers and step negotiation;  Baseline: RLE: 108 degrees 8/22: 125  Goal status: MET  5.  Patient (> 662years old) will complete five times sit to stand test in < 15 seconds indicating an increased LE strength and improved balance. Baseline: 19.95 sec 8/22: 15.65 seconds; 9/19: 14.5 sec hands-free Goal status: MET  6.  Patient will improve FOTO score by 5% to indicate improved functional mobility with ADLs.  Baseline: 46%  8/22: 55% 9/19: 57% 07/11/22: 55% Goal status: MET   7. Patient will increase six minute walk test distance to >1300 for improved functional capacity  and gait ability. Baseline: 9/19: 1190 ft with one rest break due to fatigue, rates completing test as difficult 05/09/22:  1210 ft with no rest breaks Goal status: PROGRESSING    8. Patient will report a worst R LE pain as no greater than 1/10 on VAS in order to improve tolerance with ADLs and reduced symptoms with activities. Baseline: 9/19: pain still greater than 3-4/10. 05/09/22:  5/10 pain in the R LE at its worst over the past 7 days 07/11/22: 8/10 pain in the R LE at its worst over the past 7 days Goal status: PROGRESSING    PLAN: PT FREQUENCY: 2x/week  PT DURATION: 12 weeks  PLANNED INTERVENTIONS: Therapeutic exercises, Therapeutic activity, Neuromuscular re-education, Balance training, Gait training, Patient/Family education, Self Care, Joint mobilization, Stair training, Dry Needling, Cryotherapy, Moist heat, scar mobilization, Taping, and Manual therapy  PLAN FOR NEXT SESSION:   circuit/interval training, work  on ROM/strengthening, dry needling, endurance, continue plan   Gwenlyn Saran, PT, DPT Physical Therapist- [facility]  07/11/22, 1:59 PM

## 2022-07-18 ENCOUNTER — Ambulatory Visit: Payer: Medicare Other

## 2022-07-18 DIAGNOSIS — R2689 Other abnormalities of gait and mobility: Secondary | ICD-10-CM

## 2022-07-18 DIAGNOSIS — M25661 Stiffness of right knee, not elsewhere classified: Secondary | ICD-10-CM

## 2022-07-18 DIAGNOSIS — R262 Difficulty in walking, not elsewhere classified: Secondary | ICD-10-CM | POA: Diagnosis not present

## 2022-07-18 DIAGNOSIS — R278 Other lack of coordination: Secondary | ICD-10-CM

## 2022-07-18 DIAGNOSIS — M5459 Other low back pain: Secondary | ICD-10-CM

## 2022-07-18 DIAGNOSIS — R269 Unspecified abnormalities of gait and mobility: Secondary | ICD-10-CM

## 2022-07-18 DIAGNOSIS — M6281 Muscle weakness (generalized): Secondary | ICD-10-CM

## 2022-07-18 DIAGNOSIS — R2681 Unsteadiness on feet: Secondary | ICD-10-CM

## 2022-07-18 DIAGNOSIS — M25561 Pain in right knee: Secondary | ICD-10-CM

## 2022-07-18 NOTE — Therapy (Signed)
OUTPATIENT PHYSICAL THERAPY LOWER EXTREMITY TREATMENT   Patient Name: Leslie Smith MRN: [id number] DOB:[birth year], 72 y.o., female Today's Date: 07/18/2022   PT End of Session - 07/18/22 0812     Visit Number 28    Number of Visits 38    Date for PT Re-Evaluation 10/03/22    Authorization Type Medicare Parts A & B    Authorization Time Period 12/25/21-02/20/22; 05/23/22-07/04/22    Progress Note Due on Visit 20    PT Start Time 0804    PT Stop Time 0845    PT Time Calculation (min) 41 min    Equipment Utilized During Treatment Gait belt    Activity Tolerance Patient tolerated treatment well    Behavior During Therapy WFL for tasks assessed/performed              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[place]) 09/15/2014   Bladder incontinence    Cancer ([place]) 2014   brain tumor, resected   Cellulitis and abscess of leg 2019   2 episodes this year   Complication of anesthesia    DJD (degenerative joint disease) of knee 2018   Fall    Fatigue    History of blood transfusion    Hypertension    MS (multiple sclerosis) ([place])    Myelitis ([place])    Neuromuscular disorder ([place]) 2001   multiple sclerosis diagnosed by MRI   PONV (postoperative nausea and vomiting)    severe   Thrombocytopenia ([place])    Past Surgical History:  Procedure Laterality Date   ABDOMINAL HYSTERECTOMY  2011   required wound vac for 6 weeks. ovaries had grown attached to her back bone   BRAIN SURGERY  2015   tumor resection-denies seizures   BREAST SURGERY Left 2014   biopsy. negative   HARDWARE REMOVAL Left 05/05/2018   Procedure: HARDWARE REMOVAL- LEFT ANKLE;  Surgeon: Hessie Knows, MD;  Location: [facility];  Service: Orthopedics;  Laterality: Left;   I & D EXTREMITY Left 08/25/2015   Procedure: IRRIGATION AND DEBRIDEMENT THUMB;  Surgeon: Iran Planas, MD;  Location: [facility];  Service: Orthopedics;  Laterality: Left;   ORIF ANKLE FRACTURE Left 02/20/2018   Procedure: OPEN  REDUCTION INTERNAL FIXATION (ORIF) ANKLE FRACTURE;  Surgeon: Hessie Knows, MD;  Location: [facility];  Service: Orthopedics;  Laterality: Left;   TOTAL KNEE ARTHROPLASTY Right 11/21/2021   Procedure: TOTAL KNEE ARTHROPLASTY;  Surgeon: Lovell Sheehan, MD;  Location: [facility];  Service: Orthopedics;  Laterality: Right;   Patient Active Problem List   Diagnosis Date Noted   S/P TKR (total knee replacement) using cement, right 11/21/2021   Atherosclerosis of abdominal aorta ([place]) 01/10/2021   Coronary artery disease involving native coronary artery of native heart 01/10/2021   OSA (obstructive sleep apnea) 04/19/2020   Statin intolerance 01/14/2020   S/P ORIF (open reduction internal fixation) fracture 02/20/2018   DJD (degenerative joint disease) of knee 01/02/2017   Advanced care planning/counseling discussion 01/02/2017   Thrombocytopenia ([place]) 10/30/2015   Multiple sclerosis ([place])    Essential hypertension    Hyperlipidemia    Benign neoplasm of brain ([place]) 04/30/2013   Cerebral meningioma ([place]) 04/29/2013   Malignant hypertension 04/29/2013   Obesity 04/15/2013    PCP: Fulton Reek, MD  REFERRING PROVIDER: Kurtis Bushman MD  REFERRING DIAG: R TKA, 11/21/21  THERAPY DIAG:  Difficulty in walking, not elsewhere classified  Stiffness of right knee, not elsewhere classified  Muscle weakness (generalized)  Other  low back pain  Chronic pain of right knee  Other abnormalities of gait and mobility  Other lack of coordination  Abnormality of gait and mobility  Unsteadiness on feet  Rationale for Evaluation and Treatment Rehabilitation  ONSET DATE: 11/21/21  SUBJECTIVE:   SUBJECTIVE STATEMENT:   Pt reports that the top of her feet are doing better and her legs are not swollen this morning as well.    Are you having pain? Yes: NPRS scale: 3/10;  Pain location: Tops of the feet Pain description: tenderness   PERTINENT HISTORY:  72 yo Female s/p R TKA on 11/21/21.  Patient reports having home health PT for 2-3 weeks. She reports doing well. She presents to therapy without AD. She reports not needing AD at home. She has been sleeping with CPAP and cryocuff on RLE- but has been sleeping well; PMH significant: MS, Sleep apnea, 2014 brain tumor, history of LE cellulitis, intermittent fluid build up/swelling in LE, DJD, HTN, Thrombocytopenia; Pt is followed by PCP regarding LE swelling; Pt  back on BP medications.    PRECAUTIONS: Fall Risk  WEIGHT BEARING RESTRICTIONS No  FALLS:  Has patient fallen in last 6 months? Yes. Number of falls 1  LIVING ENVIRONMENT: Lives with: lives alone Lives in: House/apartment Stairs: Yes: Internal: 13 steps; on right going up and External: 3 steps; has ramp Has following equipment at home: Single point cane, Walker - 2 wheeled, Environmental consultant - 4 wheeled, Crutches, bed side commode, and Grab bars  OCCUPATION: Retired Emergency planning/management officer  PLOF: Independent with basic ADLs, Independent with gait, and Independent with transfers   [place] transfers, increase endurance/activity tolerance, Improve strength Pt would like to be able to clean her house and be more active at home.     TODAY'S TREATMENT:  TherEx:  Seated hamstring curls with BTB for resistance, 2x10 Seated lateral step over hedgehogs while wearing 7.5# AW, 2x10 each LE Seated hip abduction with BTB resistance applied at distal thigh, 2x15 Seated LAQ with 7.5# AW donned, 2x15 each LE Seated marches with 7.5# AW donned, 2x15 each LE Step ups at stairs while wearing 7.5# AW, 2x5 each LE leading Lateral step ups at stairs while wearing 7.5# AW, 2x15 each LE leading    PATIENT EDUCATION:  Education details:  Pt educated throughout session about proper posture and technique with exercises. Improved exercise technique, movement at target joints, use of target muscles after min to mod verbal, visual, tactile cues.  Person educated: Patient Education method:  Explanation, Demonstration, and Verbal cues Education comprehension: verbalized understanding, returned demonstration, verbal cues required, and needs further education   HOME EXERCISE PROGRAM:  No updates today. Pt to continue HEP as previously given: Continue as given from home health PT Reinforced importance of ROM including quad sets and LAQ/knee flexion;   ASSESSMENT:  CLINICAL IMPRESSION:  Pt performed well with the exercises listed above and is making good improvements on the days that her MS is not contributing as much.  Pt is able to perform exercises with significant resistance and is improving weekly with mobility and endurance.  Pt did have some difficulty with foot clearance at times when ambulating with the 7.5# AW.  Pt to continue to improve mobility and strengthening of the LE's and prevent decline with MS symptoms.   Pt will continue to benefit from skilled therapy to address remaining deficits in order to improve overall QoL and return to PLOF.      OBJECTIVE IMPAIRMENTS Abnormal gait, cardiopulmonary status  limiting activity, decreased activity tolerance, decreased balance, decreased endurance, decreased mobility, difficulty walking, decreased ROM, decreased strength, hypomobility, impaired flexibility, and pain.   ACTIVITY LIMITATIONS bending, standing, squatting, stairs, transfers, and locomotion level  PARTICIPATION LIMITATIONS: cleaning, laundry, shopping, community activity, occupation, yard work, and church  PERSONAL FACTORS Age, Fitness, and 3+ comorbidities: MS, Sleep apnea, 2014 brain tumor, history of LE cellulitis, intermittent fluid build up/swelling in LE, DJD, HTN, Thrombocytopenia;   are also affecting patient's functional outcome.   REHAB POTENTIAL: Good  CLINICAL DECISION MAKING: Stable/uncomplicated  EVALUATION COMPLEXITY: Low   GOALS: Goals reviewed with patient? Yes  SHORT TERM GOALS: Target date: 01/23/2022  Patient will be adherent to HEP at  least 3x a week to improve functional strength and balance for better safety at home. Baseline: Doing some exercise from home health 8/22: yes; 9/19: feels confident/indep but states, "not doing as much as I should" (previously met) Goal status: MET  2.  Patient will improve RLE knee extension to 0 to improve knee ROM for functional tasks such as standing/walking.  Baseline: RLE: -5 degrees in supine 8/22: neutral  Goal status: MET   LONG TERM GOALS: Target date: 05/21/2022  Patient will increase 10 meter walk test to >1.77ms as to improve gait speed for better community ambulation and to reduce fall risk. Baseline: 0.98 m/s 8/22: 7.72 seconds =1.29 m/s  Goal status: MET  2.  Patient will tolerate 5 seconds of single leg stance without loss of balance to improve ability to get in and out of shower safely. Baseline: RLE: 3-5 sec inconsistent 8/22: 5 seconds Goal status: MET  3.   Patient will be independent with ascend/descend 12 steps using single UE in step over step pattern without LOB to safely get up/down stairs in home. Baseline: Not formally assessed, negotiates one step at a time; limits steps when possible 8/22: reciprocal ascending; step to descending; 9/19:able to perform recip pattern ascending/descending  Goal status: MET  4.  Pt will improve RLE knee flexion to >115 degrees for functional ROM for transfers and step negotiation;  Baseline: RLE: 108 degrees 8/22: 125  Goal status: MET  5.  Patient (> 666years old) will complete five times sit to stand test in < 15 seconds indicating an increased LE strength and improved balance. Baseline: 19.95 sec 8/22: 15.65 seconds; 9/19: 14.5 sec hands-free Goal status: MET  6.  Patient will improve FOTO score by 5% to indicate improved functional mobility with ADLs.  Baseline: 46%  8/22: 55% 9/19: 57% 07/11/22: 55% Goal status: MET   7. Patient will increase six minute walk test distance to >1300 for improved functional capacity  and gait ability. Baseline: 9/19: 1190 ft with one rest break due to fatigue, rates completing test as difficult 05/09/22:  1210 ft with no rest breaks Goal status: PROGRESSING    8. Patient will report a worst R LE pain as no greater than 1/10 on VAS in order to improve tolerance with ADLs and reduced symptoms with activities. Baseline: 9/19: pain still greater than 3-4/10. 05/09/22:  5/10 pain in the R LE at its worst over the past 7 days 07/11/22: 8/10 pain in the R LE at its worst over the past 7 days Goal status: PROGRESSING    PLAN:  PT FREQUENCY: 2x/week  PT DURATION: 12 weeks  PLANNED INTERVENTIONS: Therapeutic exercises, Therapeutic activity, Neuromuscular re-education, Balance training, Gait training, Patient/Family education, Self Care, Joint mobilization, Stair training, Dry Needling, Cryotherapy, Moist heat, scar mobilization, Taping,  and Manual therapy  PLAN FOR NEXT SESSION:   circuit/interval training, work on ROM/strengthening, dry needling, endurance, continue plan   Gwenlyn Saran, PT, DPT Physical Therapist- [facility]  07/18/22, 11:14 AM

## 2022-07-25 ENCOUNTER — Ambulatory Visit: Payer: Medicare Other

## 2022-07-25 DIAGNOSIS — R269 Unspecified abnormalities of gait and mobility: Secondary | ICD-10-CM

## 2022-07-25 DIAGNOSIS — R278 Other lack of coordination: Secondary | ICD-10-CM

## 2022-07-25 DIAGNOSIS — R262 Difficulty in walking, not elsewhere classified: Secondary | ICD-10-CM | POA: Diagnosis not present

## 2022-07-25 DIAGNOSIS — M25561 Pain in right knee: Secondary | ICD-10-CM

## 2022-07-25 DIAGNOSIS — M6281 Muscle weakness (generalized): Secondary | ICD-10-CM

## 2022-07-25 DIAGNOSIS — M25661 Stiffness of right knee, not elsewhere classified: Secondary | ICD-10-CM

## 2022-07-25 DIAGNOSIS — M5459 Other low back pain: Secondary | ICD-10-CM

## 2022-07-25 DIAGNOSIS — R2681 Unsteadiness on feet: Secondary | ICD-10-CM

## 2022-07-25 DIAGNOSIS — R2689 Other abnormalities of gait and mobility: Secondary | ICD-10-CM

## 2022-07-25 NOTE — Therapy (Signed)
OUTPATIENT PHYSICAL THERAPY LOWER EXTREMITY TREATMENT   Patient Name: Leslie Smith MRN: [id number] DOB:September 02, 1950, 72 y.o., female Today's Date: 07/25/2022   PT End of Session - 07/25/22 1149     Visit Number 29    Number of Visits 41    Date for PT Re-Evaluation 10/03/22    Authorization Type Medicare Parts A & B    Authorization Time Period 12/25/21-02/20/22; 05/23/22-07/04/22    Progress Note Due on Visit 20    PT Start Time 1149    PT Stop Time 1230    PT Time Calculation (min) 41 min    Equipment Utilized During Treatment Gait belt    Activity Tolerance Patient tolerated treatment well    Behavior During Therapy WFL for tasks assessed/performed              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[place]) 09/15/2014   Bladder incontinence    Cancer ([place]) 2014   brain tumor, resected   Cellulitis and abscess of leg 2019   2 episodes this year   Complication of anesthesia    DJD (degenerative joint disease) of knee 2018   Fall    Fatigue    History of blood transfusion    Hypertension    MS (multiple sclerosis) ([place])    Myelitis ([place])    Neuromuscular disorder ([place]) 2001   multiple sclerosis diagnosed by MRI   PONV (postoperative nausea and vomiting)    severe   Thrombocytopenia ([place])    Past Surgical History:  Procedure Laterality Date   ABDOMINAL HYSTERECTOMY  2011   required wound vac for 6 weeks. ovaries had grown attached to her back bone   BRAIN SURGERY  2015   tumor resection-denies seizures   BREAST SURGERY Left 2014   biopsy. negative   HARDWARE REMOVAL Left 05/05/2018   Procedure: HARDWARE REMOVAL- LEFT ANKLE;  Surgeon: Hessie Knows, MD;  Location: [facility];  Service: Orthopedics;  Laterality: Left;   I & D EXTREMITY Left 08/25/2015   Procedure: IRRIGATION AND DEBRIDEMENT THUMB;  Surgeon: Iran Planas, MD;  Location: [facility];  Service: Orthopedics;  Laterality: Left;   ORIF ANKLE FRACTURE Left 02/20/2018   Procedure: OPEN  REDUCTION INTERNAL FIXATION (ORIF) ANKLE FRACTURE;  Surgeon: Hessie Knows, MD;  Location: [facility];  Service: Orthopedics;  Laterality: Left;   TOTAL KNEE ARTHROPLASTY Right 11/21/2021   Procedure: TOTAL KNEE ARTHROPLASTY;  Surgeon: Lovell Sheehan, MD;  Location: [facility];  Service: Orthopedics;  Laterality: Right;   Patient Active Problem List   Diagnosis Date Noted   S/P TKR (total knee replacement) using cement, right 11/21/2021   Atherosclerosis of abdominal aorta ([place]) 01/10/2021   Coronary artery disease involving native coronary artery of native heart 01/10/2021   OSA (obstructive sleep apnea) 04/19/2020   Statin intolerance 01/14/2020   S/P ORIF (open reduction internal fixation) fracture 02/20/2018   DJD (degenerative joint disease) of knee 01/02/2017   Advanced care planning/counseling discussion 01/02/2017   Thrombocytopenia ([place]) 10/30/2015   Multiple sclerosis ([place])    Essential hypertension    Hyperlipidemia    Benign neoplasm of brain ([place]) 04/30/2013   Cerebral meningioma ([place]) 04/29/2013   Malignant hypertension 04/29/2013   Obesity 04/15/2013    PCP: Fulton Reek, MD  REFERRING PROVIDER: Kurtis Bushman MD  REFERRING DIAG: R TKA, 11/21/21  THERAPY DIAG:  Difficulty in walking, not elsewhere classified  Stiffness of right knee, not elsewhere classified  Muscle weakness (generalized)  Other  low back pain  Chronic pain of right knee  Other abnormalities of gait and mobility  Other lack of coordination  Abnormality of gait and mobility  Unsteadiness on feet  Rationale for Evaluation and Treatment Rehabilitation  ONSET DATE: 11/21/21  SUBJECTIVE:   SUBJECTIVE STATEMENT:   Pt notes that she is doing well today, noting a little pain in the lumbar spine, but overall doing well.  Pt denies any falls or other complaints at this time.  Are you having pain? Yes: NPRS scale: 3/10;  Pain location: Tops of the feet Pain description:  tenderness   PERTINENT HISTORY:  72 yo Female s/p R TKA on 11/21/21. Patient reports having home health PT for 2-3 weeks. She reports doing well. She presents to therapy without AD. She reports not needing AD at home. She has been sleeping with CPAP and cryocuff on RLE- but has been sleeping well; PMH significant: MS, Sleep apnea, 2014 brain tumor, history of LE cellulitis, intermittent fluid build up/swelling in LE, DJD, HTN, Thrombocytopenia; Pt is followed by PCP regarding LE swelling; Pt  back on BP medications.    PRECAUTIONS: Fall Risk  WEIGHT BEARING RESTRICTIONS No  FALLS:  Has patient fallen in last 6 months? Yes. Number of falls 1  LIVING ENVIRONMENT: Lives with: lives alone Lives in: House/apartment Stairs: Yes: Internal: 13 steps; on right going up and External: 3 steps; has ramp Has following equipment at home: Single point cane, Walker - 2 wheeled, Environmental consultant - 4 wheeled, Crutches, bed side commode, and Grab bars  OCCUPATION: Retired Emergency planning/management officer  PLOF: Independent with basic ADLs, Independent with gait, and Independent with transfers   [place] transfers, increase endurance/activity tolerance, Improve strength Pt would like to be able to clean her house and be more active at home.     TODAY'S TREATMENT:   TherAct:  Seated  BP: 159/72 HR: 63  Standing: BP: 145/123 HR: 70  2nd attempt: BP: 155/64 HR: 68   TherEx:  Seated hamstring curls with BTB for resistance, 2x10 Seated lateral step over hedgehogs while wearing 7.5# AW, 2x10 each LE Seated hip abduction with BTB resistance applied at distal thigh, 2x15 Seated LAQ with 7.5# AW donned, 2x15 each LE Seated marches with 7.5# AW donned, 2x15 each LE Step ups at stairs while wearing 7.5# AW, 2x5 each LE leading Lateral step ups at stairs while wearing 7.5# AW, 2x15 each LE leading    PATIENT EDUCATION:  Education details:  Pt educated throughout session about proper posture and technique  with exercises. Improved exercise technique, movement at target joints, use of target muscles after min to mod verbal, visual, tactile cues.  Person educated: Patient Education method: Explanation, Demonstration, and Verbal cues Education comprehension: verbalized understanding, returned demonstration, verbal cues required, and needs further education   HOME EXERCISE PROGRAM:  No updates today. Pt to continue HEP as previously given: Continue as given from home health PT Reinforced importance of ROM including quad sets and LAQ/knee flexion;   ASSESSMENT:  CLINICAL IMPRESSION:  BP reading performed as pt reported she has not been taking her BP medicine due to the way it makes it feels.  She states that she has been only taking 1/2 a pill over the previous 4 days and then did not take it at all yesterday.  She reports that the medicine causes her to feel cold and have "ice water running through the veins".  Pt had measurement taken upon standing position as well to check for  modified orthostatics, and noted to have a significant rise in the diastolic.  Therapist attempted to perform following the pt taking off her sweater and after 3 minutes and was found to be much closer to her baseline levels.  Pt otherwise performed well with the exercises however will need to be continuously monitored for BP going forward.  Pt advised to contact MD about medication change as well.   Pt will continue to benefit from skilled therapy to address remaining deficits in order to improve overall QoL and return to PLOF.        OBJECTIVE IMPAIRMENTS Abnormal gait, cardiopulmonary status limiting activity, decreased activity tolerance, decreased balance, decreased endurance, decreased mobility, difficulty walking, decreased ROM, decreased strength, hypomobility, impaired flexibility, and pain.   ACTIVITY LIMITATIONS bending, standing, squatting, stairs, transfers, and locomotion level  PARTICIPATION LIMITATIONS:  cleaning, laundry, shopping, community activity, occupation, yard work, and church  PERSONAL FACTORS Age, Fitness, and 3+ comorbidities: MS, Sleep apnea, 2014 brain tumor, history of LE cellulitis, intermittent fluid build up/swelling in LE, DJD, HTN, Thrombocytopenia;   are also affecting patient's functional outcome.   REHAB POTENTIAL: Good  CLINICAL DECISION MAKING: Stable/uncomplicated  EVALUATION COMPLEXITY: Low   GOALS: Goals reviewed with patient? Yes  SHORT TERM GOALS: Target date: 01/23/2022  Patient will be adherent to HEP at least 3x a week to improve functional strength and balance for better safety at home. Baseline: Doing some exercise from home health 8/22: yes; 9/19: feels confident/indep but states, "not doing as much as I should" (previously met) Goal status: MET  2.  Patient will improve RLE knee extension to 0 to improve knee ROM for functional tasks such as standing/walking.  Baseline: RLE: -5 degrees in supine 8/22: neutral  Goal status: MET   LONG TERM GOALS: Target date: 05/21/2022  Patient will increase 10 meter walk test to >1.26ms as to improve gait speed for better community ambulation and to reduce fall risk. Baseline: 0.98 m/s 8/22: 7.72 seconds =1.29 m/s  Goal status: MET  2.  Patient will tolerate 5 seconds of single leg stance without loss of balance to improve ability to get in and out of shower safely. Baseline: RLE: 3-5 sec inconsistent 8/22: 5 seconds Goal status: MET  3.   Patient will be independent with ascend/descend 12 steps using single UE in step over step pattern without LOB to safely get up/down stairs in home. Baseline: Not formally assessed, negotiates one step at a time; limits steps when possible 8/22: reciprocal ascending; step to descending; 9/19:able to perform recip pattern ascending/descending  Goal status: MET  4.  Pt will improve RLE knee flexion to >115 degrees for functional ROM for transfers and step negotiation;   Baseline: RLE: 108 degrees 8/22: 125  Goal status: MET  5.  Patient (> 651years old) will complete five times sit to stand test in < 15 seconds indicating an increased LE strength and improved balance. Baseline: 19.95 sec 8/22: 15.65 seconds; 9/19: 14.5 sec hands-free Goal status: MET  6.  Patient will improve FOTO score by 5% to indicate improved functional mobility with ADLs.  Baseline: 46%  8/22: 55% 9/19: 57% 07/11/22: 55% Goal status: MET   7. Patient will increase six minute walk test distance to >1300 for improved functional capacity and gait ability. Baseline: 9/19: 1190 ft with one rest break due to fatigue, rates completing test as difficult 05/09/22:  1210 ft with no rest breaks Goal status: PROGRESSING    8. Patient will report a  worst R LE pain as no greater than 1/10 on VAS in order to improve tolerance with ADLs and reduced symptoms with activities. Baseline: 9/19: pain still greater than 3-4/10. 05/09/22:  5/10 pain in the R LE at its worst over the past 7 days 07/11/22: 8/10 pain in the R LE at its worst over the past 7 days Goal status: PROGRESSING    PLAN:  PT FREQUENCY: 2x/week  PT DURATION: 12 weeks  PLANNED INTERVENTIONS: Therapeutic exercises, Therapeutic activity, Neuromuscular re-education, Balance training, Gait training, Patient/Family education, Self Care, Joint mobilization, Stair training, Dry Needling, Cryotherapy, Moist heat, scar mobilization, Taping, and Manual therapy  PLAN FOR NEXT SESSION:   Check BP circuit/interval training, work on ROM/strengthening, dry needling, endurance, continue plan   Gwenlyn Saran, PT, DPT Physical Therapist- [facility]  07/25/22, 5:36 PM

## 2022-08-01 ENCOUNTER — Ambulatory Visit: Payer: Medicare Other

## 2022-08-02 ENCOUNTER — Ambulatory Visit: Payer: Medicare Other

## 2022-08-02 DIAGNOSIS — M5459 Other low back pain: Secondary | ICD-10-CM

## 2022-08-02 DIAGNOSIS — R262 Difficulty in walking, not elsewhere classified: Secondary | ICD-10-CM | POA: Diagnosis not present

## 2022-08-02 DIAGNOSIS — R278 Other lack of coordination: Secondary | ICD-10-CM

## 2022-08-02 DIAGNOSIS — M6281 Muscle weakness (generalized): Secondary | ICD-10-CM

## 2022-08-02 DIAGNOSIS — R2689 Other abnormalities of gait and mobility: Secondary | ICD-10-CM

## 2022-08-02 DIAGNOSIS — M25661 Stiffness of right knee, not elsewhere classified: Secondary | ICD-10-CM

## 2022-08-02 DIAGNOSIS — M25561 Pain in right knee: Secondary | ICD-10-CM

## 2022-08-02 DIAGNOSIS — R2681 Unsteadiness on feet: Secondary | ICD-10-CM

## 2022-08-02 DIAGNOSIS — R269 Unspecified abnormalities of gait and mobility: Secondary | ICD-10-CM

## 2022-08-02 NOTE — Therapy (Signed)
OUTPATIENT PHYSICAL THERAPY LOWER EXTREMITY TREATMENT/DISCHARGE   Patient Name: SOLASH GUIST MRN: [id number] DOB:06-12-1950, 72 y.o., female Today's Date: 08/02/2022   PT End of Session - 08/02/22 0905     Visit Number 30    Number of Visits 92    Date for PT Re-Evaluation 10/03/22    Authorization Type Medicare Parts A & B    Authorization Time Period 12/25/21-02/20/22; 05/23/22-07/04/22    Progress Note Due on Visit 20    PT Start Time 0854    PT Stop Time 0940    PT Time Calculation (min) 46 min    Equipment Utilized During Treatment Gait belt    Activity Tolerance Patient tolerated treatment well    Behavior During Therapy [facility] for tasks assessed/performed              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[place]) 09/15/2014   Bladder incontinence    Cancer ([place]) 2014   brain tumor, resected   Cellulitis and abscess of leg 2019   2 episodes this year   Complication of anesthesia    DJD (degenerative joint disease) of knee 2018   Fall    Fatigue    History of blood transfusion    Hypertension    MS (multiple sclerosis) ([place])    Myelitis ([place])    Neuromuscular disorder ([place]) 2001   multiple sclerosis diagnosed by MRI   PONV (postoperative nausea and vomiting)    severe   Thrombocytopenia ([place])    Past Surgical History:  Procedure Laterality Date   ABDOMINAL HYSTERECTOMY  2011   required wound vac for 6 weeks. ovaries had grown attached to her back bone   BRAIN SURGERY  2015   tumor resection-denies seizures   BREAST SURGERY Left 2014   biopsy. negative   HARDWARE REMOVAL Left 05/05/2018   Procedure: HARDWARE REMOVAL- LEFT ANKLE;  Surgeon: Hessie Knows, MD;  Location: [facility];  Service: Orthopedics;  Laterality: Left;   I & D EXTREMITY Left 08/25/2015   Procedure: IRRIGATION AND DEBRIDEMENT THUMB;  Surgeon: Iran Planas, MD;  Location: [place];  Service: Orthopedics;  Laterality: Left;   ORIF ANKLE FRACTURE Left 02/20/2018   Procedure:  OPEN REDUCTION INTERNAL FIXATION (ORIF) ANKLE FRACTURE;  Surgeon: Hessie Knows, MD;  Location: [facility];  Service: Orthopedics;  Laterality: Left;   TOTAL KNEE ARTHROPLASTY Right 11/21/2021   Procedure: TOTAL KNEE ARTHROPLASTY;  Surgeon: Lovell Sheehan, MD;  Location: [facility];  Service: Orthopedics;  Laterality: Right;   Patient Active Problem List   Diagnosis Date Noted   S/P TKR (total knee replacement) using cement, right 11/21/2021   Atherosclerosis of abdominal aorta ([place]) 01/10/2021   Coronary artery disease involving native coronary artery of native heart 01/10/2021   OSA (obstructive sleep apnea) 04/19/2020   Statin intolerance 01/14/2020   S/P ORIF (open reduction internal fixation) fracture 02/20/2018   DJD (degenerative joint disease) of knee 01/02/2017   Advanced care planning/counseling discussion 01/02/2017   Thrombocytopenia ([place]) 10/30/2015   Multiple sclerosis ([place])    Essential hypertension    Hyperlipidemia    Benign neoplasm of brain ([place]) 04/30/2013   Cerebral meningioma ([place]) 04/29/2013   Malignant hypertension 04/29/2013   Obesity 04/15/2013    PCP: Fulton Reek, MD  REFERRING PROVIDER: Kurtis Bushman MD  REFERRING DIAG: R TKA, 11/21/21  THERAPY DIAG:  Difficulty in walking, not elsewhere classified  Stiffness of right knee, not elsewhere classified  Muscle weakness (generalized)  Other  low back pain  Chronic pain of right knee  Other abnormalities of gait and mobility  Other lack of coordination  Abnormality of gait and mobility  Unsteadiness on feet  Rationale for Evaluation and Treatment Rehabilitation  ONSET DATE: 11/21/21  SUBJECTIVE:   SUBJECTIVE STATEMENT:   Pt notes that she is doing well today, noting a little pain in the lumbar spine, but overall doing well.  Pt denies any falls or other complaints at this time.  Are you having pain? Yes: NPRS scale: 3/10;  Pain location: Tops of the feet Pain description:  tenderness   PERTINENT HISTORY:  72 yo Female s/p R TKA on 11/21/21. Patient reports having home health PT for 2-3 weeks. She reports doing well. She presents to therapy without AD. She reports not needing AD at home. She has been sleeping with CPAP and cryocuff on RLE- but has been sleeping well; PMH significant: MS, Sleep apnea, 2014 brain tumor, history of LE cellulitis, intermittent fluid build up/swelling in LE, DJD, HTN, Thrombocytopenia; Pt is followed by PCP regarding LE swelling; Pt  back on BP medications.    PRECAUTIONS: Fall Risk  WEIGHT BEARING RESTRICTIONS No  FALLS:  Has patient fallen in last 6 months? Yes. Number of falls 1  LIVING ENVIRONMENT: Lives with: lives alone Lives in: House/apartment Stairs: Yes: Internal: 13 steps; on right going up and External: 3 steps; has ramp Has following equipment at home: Single point cane, Walker - 2 wheeled, Environmental consultant - 4 wheeled, Crutches, bed side commode, and Grab bars  OCCUPATION: Retired Emergency planning/management officer  PLOF: Independent with basic ADLs, Independent with gait, and Independent with transfers   [place] transfers, increase endurance/activity tolerance, Improve strength Pt would like to be able to clean her house and be more active at home.     TODAY'S TREATMENT:   TherAct:  Seated  BP: 153/74 HR: 60  Standing: BP: 157/63 HR: 67  2nd attempt: BP: 149/60 HR: 62   Neuro:  Goal assessment this morning and noted below:   PATIENT EDUCATION:  Education details:  Pt educated throughout session about proper posture and technique with exercises. Improved exercise technique, movement at target joints, use of target muscles after min to mod verbal, visual, tactile cues.  Person educated: Patient Education method: Explanation, Demonstration, and Verbal cues Education comprehension: verbalized understanding, returned demonstration, verbal cues required, and needs further education   HOME EXERCISE  PROGRAM:  No updates today. Pt to continue HEP as previously given: Continue as given from home health PT Reinforced importance of ROM including quad sets and LAQ/knee flexion;   ASSESSMENT:  CLINICAL IMPRESSION:  Pt has progressed well with therapy for the knee, and at this time is ready to be discharged from therapy.  Pt states she is likely going to take a break and see how she performs with her current HEP and potentially return for more balance related activities in the future if necessary.  Pt able to achieve all goals set forth during current POC and has made vast improvements in her QoL as self reported and demonstrated through the achievement of these goals.  Pt encouraged to reach out to the clinic if any needs arise or has any questions.  Pt is d/c at this time.      OBJECTIVE IMPAIRMENTS Abnormal gait, cardiopulmonary status limiting activity, decreased activity tolerance, decreased balance, decreased endurance, decreased mobility, difficulty walking, decreased ROM, decreased strength, hypomobility, impaired flexibility, and pain.   ACTIVITY LIMITATIONS bending, standing, squatting,  stairs, transfers, and locomotion level  PARTICIPATION LIMITATIONS: cleaning, laundry, shopping, community activity, occupation, yard work, and church  PERSONAL FACTORS Age, Fitness, and 3+ comorbidities: MS, Sleep apnea, 2014 brain tumor, history of LE cellulitis, intermittent fluid build up/swelling in LE, DJD, HTN, Thrombocytopenia;   are also affecting patient's functional outcome.   REHAB POTENTIAL: Good  CLINICAL DECISION MAKING: Stable/uncomplicated  EVALUATION COMPLEXITY: Low   GOALS: Goals reviewed with patient? Yes  SHORT TERM GOALS: Target date: 01/23/2022  Patient will be adherent to HEP at least 3x a week to improve functional strength and balance for better safety at home. Baseline: Doing some exercise from home health 8/22: yes; 9/19: feels confident/indep but states, "not  doing as much as I should" (previously met) Goal status: MET  2.  Patient will improve RLE knee extension to 0 to improve knee ROM for functional tasks such as standing/walking.  Baseline: RLE: -5 degrees in supine 8/22: neutral  Goal status: MET   LONG TERM GOALS: Target date: 05/21/2022  Patient will increase 10 meter walk test to >1.42ms as to improve gait speed for better community ambulation and to reduce fall risk. Baseline: 0.98 m/s 8/22: 7.72 seconds =1.29 m/s  Goal status: MET  2.  Patient will tolerate 5 seconds of single leg stance without loss of balance to improve ability to get in and out of shower safely. Baseline: RLE: 3-5 sec inconsistent 8/22: 5 seconds Goal status: MET  3.   Patient will be independent with ascend/descend 12 steps using single UE in step over step pattern without LOB to safely get up/down stairs in home. Baseline: Not formally assessed, negotiates one step at a time; limits steps when possible 8/22: reciprocal ascending; step to descending; 9/19:able to perform recip pattern ascending/descending  Goal status: MET  4.  Pt will improve RLE knee flexion to >115 degrees for functional ROM for transfers and step negotiation;  Baseline: RLE: 108 degrees 8/22: 125  Goal status: MET  5.  Patient (> 686years old) will complete five times sit to stand test in < 15 seconds indicating an increased LE strength and improved balance. Baseline: 19.95 sec 8/22: 15.65 seconds; 9/19: 14.5 sec hands-free Goal status: MET  6.  Patient will improve FOTO score by 5% to indicate improved functional mobility with ADLs.  Baseline: 46%  8/22: 55% 9/19: 57% 07/11/22: 55% Goal status: MET   7. Patient will increase six minute walk test distance to >1300 for improved functional capacity and gait ability. Baseline: 9/19: 1190 ft with one rest break due to fatigue, rates completing test as difficult 05/09/22:  1210 ft with no rest breaks 08/02/22: MET Goal status:  PROGRESSING    8. Patient will report a worst R LE pain as no greater than 1/10 on VAS in order to improve tolerance with ADLs and reduced symptoms with activities. Baseline: 9/19: pain still greater than 3-4/10. 05/09/22:  5/10 pain in the R LE at its worst over the past 7 days 07/11/22: 8/10 pain in the R LE at its worst over the past 7 days 08/02/22: 5/10 pain in the R LE at its worst over the past 7 days. Goal status: PROGRESSING    PLAN:  PT FREQUENCY: 2x/week  PT DURATION: 12 weeks  PLANNED INTERVENTIONS: Therapeutic exercises, Therapeutic activity, Neuromuscular re-education, Balance training, Gait training, Patient/Family education, Self Care, Joint mobilization, Stair training, Dry Needling, Cryotherapy, Moist heat, scar mobilization, Taping, and Manual therapy  PLAN FOR NEXT SESSION:   D/c at  this time.   Gwenlyn Saran, PT, DPT Physical Therapist- [facility]  08/02/22, 10:07 AM

## 2022-08-08 ENCOUNTER — Ambulatory Visit: Payer: Medicare Other

## 2022-08-15 ENCOUNTER — Ambulatory Visit: Payer: Medicare Other

## 2022-10-29 ENCOUNTER — Inpatient Hospital Stay: Payer: Medicare Other | Attending: Oncology

## 2022-10-29 DIAGNOSIS — D693 Immune thrombocytopenic purpura: Secondary | ICD-10-CM

## 2022-10-29 LAB — CBC WITH DIFFERENTIAL/PLATELET
Abs Immature Granulocytes: 0.04 10*3/uL (ref 0.00–0.07)
Basophils Absolute: 0.1 10*3/uL (ref 0.0–0.1)
Basophils Relative: 2 %
Eosinophils Absolute: 0.2 10*3/uL (ref 0.0–0.5)
Eosinophils Relative: 3 %
HCT: 33.1 % — ABNORMAL LOW (ref 36.0–46.0)
Hemoglobin: 10.7 g/dL — ABNORMAL LOW (ref 12.0–15.0)
Immature Granulocytes: 1 %
Lymphocytes Relative: 17 %
Lymphs Abs: 1.5 10*3/uL (ref 0.7–4.0)
MCH: 30.3 pg (ref 26.0–34.0)
MCHC: 32.3 g/dL (ref 30.0–36.0)
MCV: 93.8 fL (ref 80.0–100.0)
Monocytes Absolute: 0.6 10*3/uL (ref 0.1–1.0)
Monocytes Relative: 7 %
Neutro Abs: 6 10*3/uL (ref 1.7–7.7)
Neutrophils Relative %: 70 %
Platelets: 146 10*3/uL — ABNORMAL LOW (ref 150–400)
RBC: 3.53 MIL/uL — ABNORMAL LOW (ref 3.87–5.11)
RDW: 13.9 % (ref 11.5–15.5)
WBC: 8.5 10*3/uL (ref 4.0–10.5)
nRBC: 0 % (ref 0.0–0.2)

## 2023-01-09 ENCOUNTER — Other Ambulatory Visit: Payer: Self-pay | Admitting: Internal Medicine

## 2023-01-09 DIAGNOSIS — Z1231 Encounter for screening mammogram for malignant neoplasm of breast: Secondary | ICD-10-CM

## 2023-05-07 ENCOUNTER — Other Ambulatory Visit: Payer: Medicare Other

## 2023-05-07 ENCOUNTER — Ambulatory Visit: Payer: Medicare Other | Admitting: Oncology

## 2023-05-20 ENCOUNTER — Other Ambulatory Visit: Payer: Self-pay | Admitting: *Deleted

## 2023-05-20 DIAGNOSIS — D649 Anemia, unspecified: Secondary | ICD-10-CM

## 2023-05-20 DIAGNOSIS — D693 Immune thrombocytopenic purpura: Secondary | ICD-10-CM

## 2023-05-21 ENCOUNTER — Encounter: Payer: Self-pay | Admitting: Oncology

## 2023-05-21 ENCOUNTER — Inpatient Hospital Stay (HOSPITAL_BASED_OUTPATIENT_CLINIC_OR_DEPARTMENT_OTHER): Payer: Medicare Other | Admitting: Oncology

## 2023-05-21 ENCOUNTER — Inpatient Hospital Stay: Payer: Medicare Other | Attending: Oncology

## 2023-05-21 VITALS — BP 174/70 | HR 70 | Temp 97.5°F | Resp 18 | Wt 293.3 lb

## 2023-05-21 DIAGNOSIS — R768 Other specified abnormal immunological findings in serum: Secondary | ICD-10-CM | POA: Diagnosis not present

## 2023-05-21 DIAGNOSIS — Z79899 Other long term (current) drug therapy: Secondary | ICD-10-CM | POA: Diagnosis not present

## 2023-05-21 DIAGNOSIS — D693 Immune thrombocytopenic purpura: Secondary | ICD-10-CM

## 2023-05-21 DIAGNOSIS — I700 Atherosclerosis of aorta: Secondary | ICD-10-CM | POA: Insufficient documentation

## 2023-05-21 DIAGNOSIS — D649 Anemia, unspecified: Secondary | ICD-10-CM

## 2023-05-21 LAB — CBC WITH DIFFERENTIAL/PLATELET
Abs Immature Granulocytes: 0.05 10*3/uL (ref 0.00–0.07)
Basophils Absolute: 0.1 10*3/uL (ref 0.0–0.1)
Basophils Relative: 1 %
Eosinophils Absolute: 0.3 10*3/uL (ref 0.0–0.5)
Eosinophils Relative: 3 %
HCT: 34.6 % — ABNORMAL LOW (ref 36.0–46.0)
Hemoglobin: 11.2 g/dL — ABNORMAL LOW (ref 12.0–15.0)
Immature Granulocytes: 1 %
Lymphocytes Relative: 14 %
Lymphs Abs: 1.3 10*3/uL (ref 0.7–4.0)
MCH: 30 pg (ref 26.0–34.0)
MCHC: 32.4 g/dL (ref 30.0–36.0)
MCV: 92.8 fL (ref 80.0–100.0)
Monocytes Absolute: 0.5 10*3/uL (ref 0.1–1.0)
Monocytes Relative: 5 %
Neutro Abs: 7.5 10*3/uL (ref 1.7–7.7)
Neutrophils Relative %: 76 %
Platelets: 152 10*3/uL (ref 150–400)
RBC: 3.73 MIL/uL — ABNORMAL LOW (ref 3.87–5.11)
RDW: 13.7 % (ref 11.5–15.5)
WBC: 9.7 10*3/uL (ref 4.0–10.5)
nRBC: 0 % (ref 0.0–0.2)

## 2023-05-21 NOTE — Progress Notes (Signed)
Hematology/Oncology Consult note [facility]  Telephone:([phone] [phone]  Patient Care Team: Marguarite Arbour, MD as PCP - General (Internal Medicine) Creig Hines, MD as Consulting Physician (Oncology)   Name of the patient: Leslie Smith  [id number]  04-Oct-1950   Date of visit: 05/21/23  Diagnosis-chronic ITP Normocytic anemia likely secondary to chronic disease with a component of iron deficiency  Chief complaint/ Reason for visit-routine follow-up of ITP and anemia  Heme/Onc history: Patient is a 72 year old female with a past medical history significant for hypertension, multiple sclerosis who was referred to [place] for thrombocytopenia.  Of note patient has moderate thrombocytopenia at least dating back for the last 5 years.  Her CBC in November 2015 showed white count of 6.7, H&H of 12.8/40.1 and a platelet count of 49.  Since then her platelet count has gone up and down and usually remains between 40s to 60s.  On one occasion in 2017 it did drop down to 19.  Most recent platelet count from 01/04/2019 showed white count of 9.4, H&H of 11.5/35.5 and a platelet count of 62.  Patient has undergone hysterectomy in 2011 as well as ankle surgery in 2019 despite her low platelet count and did not have any significant bleeding issues.     Results of blood work from 01/04/2019 were as follows: CBC showed white count of 9.4, H&H 11.5/35.5 and a platelet count of 62.  B12 level is low at 219.  Folate was normal.  HIV testing was negative.  Smear review was unremarkable and H. pylori stool antigen was negative  Interval history-patient is trying to lose weight and reports ongoing fatigue  ECOG PS- 1 Pain scale- 0   Review of systems- Review of Systems  Constitutional:  Positive for malaise/fatigue. Negative for chills, fever and weight loss.       Weight gain  HENT:  Negative for congestion, ear discharge and nosebleeds.   Eyes:  Negative for blurred  vision.  Respiratory:  Negative for cough, hemoptysis, sputum production, shortness of breath and wheezing.   Cardiovascular:  Negative for chest pain, palpitations, orthopnea and claudication.  Gastrointestinal:  Negative for abdominal pain, blood in stool, constipation, diarrhea, heartburn, melena, nausea and vomiting.  Genitourinary:  Negative for dysuria, flank pain, frequency, hematuria and urgency.  Musculoskeletal:  Negative for back pain, joint pain and myalgias.  Skin:  Negative for rash.  Neurological:  Negative for dizziness, tingling, focal weakness, seizures, weakness and headaches.  Endo/Heme/Allergies:  Does not bruise/bleed easily.  Psychiatric/Behavioral:  Negative for depression and suicidal ideas. The patient does not have insomnia.       Allergies  Allergen Reactions   Ezetimibe Other (See Comments)    Joint Pain    Watermelon [Citrullus Vulgaris] Diarrhea    Severe diarrhea within 2 minutes of eating   Adhesive [Tape]    Other Nausea And Vomiting    general anesthesia - deathly ill    Atorvastatin Other (See Comments)    Joint pain   Meloxicam Nausea Only     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[facility]) 09/15/2014   Bladder incontinence    Cancer ([facility]) 2014   brain tumor, resected   Cellulitis and abscess of leg 2019   2 episodes this year   Complication of anesthesia    DJD (degenerative joint disease) of knee 2018   Fall    Fatigue    History of blood transfusion  Hypertension    MS (multiple sclerosis) ([facility])    Myelitis ([facility])    Neuromuscular disorder ([facility]) 2001   multiple sclerosis diagnosed by MRI   PONV (postoperative nausea and vomiting)    severe   Thrombocytopenia ([facility])      Past Surgical History:  Procedure Laterality Date   ABDOMINAL HYSTERECTOMY  2011   required wound vac for 6 weeks. ovaries had grown attached to her back bone   BRAIN SURGERY  2015   tumor resection-denies seizures   BREAST SURGERY Left  2014   biopsy. negative   HARDWARE REMOVAL Left 05/05/2018   Procedure: HARDWARE REMOVAL- LEFT ANKLE;  Surgeon: Kennedy Bucker, MD;  Location: [facility];  Service: Orthopedics;  Laterality: Left;   I & D EXTREMITY Left 08/25/2015   Procedure: IRRIGATION AND DEBRIDEMENT THUMB;  Surgeon: Bradly Bienenstock, MD;  Location[facility];  Service: Orthopedics;  Laterality: Left;   ORIF ANKLE FRACTURE Left 02/20/2018   Procedure: OPEN REDUCTION INTERNAL FIXATION (ORIF) ANKLE FRACTURE;  Surgeon: Kennedy Bucker, MD;  Location: [facility];  Service: Orthopedics;  Laterality: Left;   TOTAL KNEE ARTHROPLASTY Right 11/21/2021   Procedure: TOTAL KNEE ARTHROPLASTY;  Surgeon: Lyndle Herrlich, MD;  Location: [facility];  Service: Orthopedics;  Laterality: Right;    Social History   Socioeconomic History   Marital status: Single    Spouse name: Not on file   Number of children: Not on file   Years of education: high school, some college    Highest education level: High school graduate  Occupational History   Occupation: Tree surgeon    Comment: disabled d/t MS  Tobacco Use   Smoking status: Never   Smokeless tobacco: Never  Vaping Use   Vaping status: Never Used  Substance and Sexual Activity   Alcohol use: Yes    Comment: occassional   Drug use: Yes    Types: Marijuana    Comment: uses for MS   Sexual activity: Not on file  Other Topics Concern   Not on file  Social History Narrative   Tai chi at [employer]    Social Determinants of Health   Financial Resource Strain: Low Risk  (12/17/2017)   Overall Financial Resource Strain (CARDIA)    Difficulty of Paying Living Expenses: Not very hard  Food Insecurity: No Food Insecurity (12/17/2017)   Hunger Vital Sign    Worried About Running Out of Food in the Last Year: Never true    Ran Out of Food in the Last Year: Never true  Transportation Needs: No Transportation Needs (12/17/2017)   PRAPARE - Administrator, Civil Service (Medical): No    Lack of  Transportation (Non-Medical): No  Physical Activity: Inactive (12/17/2017)   Exercise Vital Sign    Days of Exercise per Week: 0 days    Minutes of Exercise per Session: 0 min  Stress: No Stress Concern Present (12/17/2017)   [employer] of Occupational Health - Occupational Stress Questionnaire    Feeling of Stress : Not at all  Social Connections: Somewhat Isolated (12/17/2017)   Social Connection and Isolation Panel [NHANES]    Frequency of Communication with Friends and Family: More than three times a week    Frequency of Social Gatherings with Friends and Family: More than three times a week    Attends Religious Services: More than 4 times per year    Active Member of [employer] or Organizations: No    Attends Banker Meetings: Never  Marital Status: Never married  Intimate Partner Violence: Not At Risk (12/17/2017)   Humiliation, Afraid, Rape, and Kick questionnaire    Fear of Current or Ex-Partner: No    Emotionally Abused: No    Physically Abused: No    Sexually Abused: No    Family History  Problem Relation Age of Onset   Heart disease Father    Cancer Father      Current Outpatient Medications:    aspirin 81 MG chewable tablet, Chew 1 tablet (81 mg total) by mouth 2 (two) times daily. (Patient not taking: Reported on 02/20/2022), Disp: 60 tablet, Rfl: 0   Cholecalciferol (VITAMIN D-3) 25 MCG (1000 UT) CAPS, Take 1,000 Units by mouth daily., Disp: , Rfl:    docusate sodium (COLACE) 100 MG capsule, Take 1 capsule (100 mg total) by mouth 2 (two) times daily. (Patient not taking: Reported on 02/20/2022), Disp: 30 capsule, Rfl: 0   ezetimibe (ZETIA) 10 MG tablet, Take 10 mg by mouth daily. (Patient not taking: Reported on 02/20/2022), Disp: , Rfl:    FEROSUL 325 (65 Fe) MG tablet, Take by mouth., Disp: , Rfl:    fluticasone (FLONASE) 50 MCG/ACT nasal spray, Place into the nose., Disp: , Rfl:    furosemide (LASIX) 20 MG tablet, Take 20 mg by mouth 2 (two) times  daily., Disp: , Rfl:    hydrALAZINE (APRESOLINE) 50 MG tablet, Take 1 tablet by mouth 2 (two) times daily., Disp: , Rfl:    HYDROcodone-acetaminophen (NORCO/VICODIN) 5-325 MG tablet, Take 1 tablet by mouth every 4 (four) hours as needed for moderate pain (pain score 4-6). (Patient not taking: Reported on 02/20/2022), Disp: 30 tablet, Rfl: 0   ibuprofen (IBU) 800 MG tablet, Take 1 tablet (800 mg total) by mouth every 8 (eight) hours as needed for moderate pain. (Patient not taking: Reported on 01/30/2021), Disp: 30 tablet, Rfl: 6   losartan (COZAAR) 100 MG tablet, , Disp: , Rfl:    losartan-hydrochlorothiazide (HYZAAR) 100-25 MG tablet, , Disp: , Rfl:    metoprolol succinate (TOPROL-XL) 50 MG 24 hr tablet, Take 50 mg by mouth daily., Disp: , Rfl:    oxyCODONE (OXY IR/ROXICODONE) 5 MG immediate release tablet, Take 1 tablet every 4 hours by oral route. (Patient not taking: Reported on 04/30/2022), Disp: , Rfl:    tiZANidine (ZANAFLEX) 4 MG tablet, Take 4 mg by mouth 3 (three) times daily., Disp: , Rfl:    traMADol (ULTRAM) 50 MG tablet, Take 1 tablet every 6 hours by oral route as needed. (Patient not taking: Reported on 02/20/2022), Disp: , Rfl:    vitamin B-12 (CYANOCOBALAMIN) 1000 MCG tablet, Take 1,000 mcg by mouth daily., Disp: , Rfl:   Physical exam:  Vitals:   05/21/23 1519 05/21/23 1524  BP: (!) 178/67 (!) 174/70  Pulse: 70   Resp: 18   Temp: (!) 97.5 F (36.4 C)   TempSrc: Tympanic   SpO2: 98%   Weight: 293 lb 4.8 oz (133 kg)    Physical Exam Cardiovascular:     Rate and Rhythm: Normal rate and regular rhythm.     Heart sounds: Normal heart sounds.  Pulmonary:     Effort: Pulmonary effort is normal.     Breath sounds: Normal breath sounds.  Skin:    General: Skin is warm and dry.  Neurological:     Mental Status: She is alert and oriented to person, place, and time.         Latest Ref Rng & Units  04/30/2022   10:02 AM  CMP  Glucose 70 - 99 mg/dL 604   BUN 8 - 23 mg/dL  25   Creatinine 5.40 - 1.00 mg/dL 9.81   Sodium 191 - 478 mmol/L 137   Potassium 3.5 - 5.1 mmol/L 3.7   Chloride 98 - 111 mmol/L 104   CO2 22 - 32 mmol/L 25   Calcium 8.9 - 10.3 mg/dL 9.0   Total Protein 6.5 - 8.1 g/dL 7.5   Total Bilirubin 0.3 - 1.2 mg/dL 0.5   Alkaline Phos 38 - 126 U/L 72   AST 15 - 41 U/L 18   ALT 0 - 44 U/L 15       Latest Ref Rng & Units 05/21/2023    2:51 PM  CBC  WBC 4.0 - 10.5 K/uL 9.7   Hemoglobin 12.0 - 15.0 g/dL 29.5   Hematocrit 62.1 - 46.0 % 34.6   Platelets 150 - 400 K/uL 152      Assessment and plan- Patient is a 72 y.o. female who is here for follow-up of following issues  Chronic ITP: Platelet counts back send [place] but she has not required any treatment for her ITP in the last 4 years.  Platelet counts are presently normal at 152.  Continue to monitor  Normocytic anemia: Patient's baseline hemoglobin  runs around [place] and she is currently at her baseline.  I will plan to repeat ferritin and iron studies in 6 months in 1 year and see her back in 1 year.  She was found to have a mildly elevated free light chain ratio 1.8 back in November 2023 which I will recheck with next set of labs   Visit Diagnosis 1. Atherosclerosis of abdominal aorta (HCC)   2. Chronic ITP (idiopathic thrombocytopenia) (HCC)      Dr. Owens Shark, MD, MPH [facility] [phone] 05/21/2023 4:12 PM

## 2023-07-25 ENCOUNTER — Emergency Department: Payer: Medicare Other

## 2023-07-25 ENCOUNTER — Other Ambulatory Visit: Payer: Self-pay

## 2023-07-25 DIAGNOSIS — S0993XA Unspecified injury of face, initial encounter: Secondary | ICD-10-CM | POA: Diagnosis present

## 2023-07-25 DIAGNOSIS — W1839XA Other fall on same level, initial encounter: Secondary | ICD-10-CM | POA: Insufficient documentation

## 2023-07-25 DIAGNOSIS — S0181XA Laceration without foreign body of other part of head, initial encounter: Secondary | ICD-10-CM | POA: Insufficient documentation

## 2023-07-25 LAB — BASIC METABOLIC PANEL WITH GFR
Anion gap: 13 (ref 5–15)
BUN: 17 mg/dL (ref 8–23)
CO2: 24 mmol/L (ref 22–32)
Calcium: 9.4 mg/dL (ref 8.9–10.3)
Chloride: 104 mmol/L (ref 98–111)
Creatinine, Ser: 1.03 mg/dL — ABNORMAL HIGH (ref 0.44–1.00)
GFR, Estimated: 58 mL/min — ABNORMAL LOW
Glucose, Bld: 115 mg/dL — ABNORMAL HIGH (ref 70–99)
Potassium: 3.8 mmol/L (ref 3.5–5.1)
Sodium: 141 mmol/L (ref 135–145)

## 2023-07-25 LAB — CBC WITH DIFFERENTIAL/PLATELET
Abs Immature Granulocytes: 0.05 10*3/uL (ref 0.00–0.07)
Basophils Absolute: 0.1 10*3/uL (ref 0.0–0.1)
Basophils Relative: 1 %
Eosinophils Absolute: 0.2 10*3/uL (ref 0.0–0.5)
Eosinophils Relative: 2 %
HCT: 37.1 % (ref 36.0–46.0)
Hemoglobin: 12 g/dL (ref 12.0–15.0)
Immature Granulocytes: 1 %
Lymphocytes Relative: 17 %
Lymphs Abs: 1.8 10*3/uL (ref 0.7–4.0)
MCH: 29.6 pg (ref 26.0–34.0)
MCHC: 32.3 g/dL (ref 30.0–36.0)
MCV: 91.4 fL (ref 80.0–100.0)
Monocytes Absolute: 0.6 10*3/uL (ref 0.1–1.0)
Monocytes Relative: 6 %
Neutro Abs: 8.2 10*3/uL — ABNORMAL HIGH (ref 1.7–7.7)
Neutrophils Relative %: 73 %
Platelets: 187 10*3/uL (ref 150–400)
RBC: 4.06 MIL/uL (ref 3.87–5.11)
RDW: 14.6 % (ref 11.5–15.5)
WBC: 11 10*3/uL — ABNORMAL HIGH (ref 4.0–10.5)
nRBC: 0 % (ref 0.0–0.2)

## 2023-07-25 NOTE — ED Triage Notes (Signed)
Patient C/O mechanical fall. Patient states she was stepping off the curb and lost her footing. Patient has large, deep laceration above left eye (bleeding controlled), no blood thinners. No LOC. Denies any loss of vision or trauma to the left eye itself.

## 2023-07-25 NOTE — ED Provider Triage Note (Signed)
Emergency Medicine Provider Triage Evaluation Note  Leslie Smith , a 73 y.o. female  was evaluated in triage.  Pt complains of fall, hit head, large laceration.  Review of Systems  Positive:  Negative:   Physical Exam  BP (!) 197/71   Pulse 83   Temp 98.4 F (36.9 C) (Oral)   Resp 18   Ht 5\' 6"  (1.676 m)   Wt 133.8 kg   SpO2 99%   BMI 47.61 kg/m  Gen:   Awake, no distress   Resp:  Normal effort  MSK:   Moves extremities without difficulty  Other:    Medical Decision Making  Medically screening exam initiated at 9:20 PM.  Appropriate orders placed.  Leslie Smith was informed that the remainder of the evaluation will be completed by another provider, this initial triage assessment does not replace that evaluation, and the importance of remaining in the ED until their evaluation is complete.     Leslie Ghee, PA-C 07/25/23 2120

## 2023-07-26 ENCOUNTER — Emergency Department
Admission: EM | Admit: 2023-07-26 | Discharge: 2023-07-26 | Disposition: A | Discharge status: Home / Self Care | Payer: Medicare Other | Attending: Emergency Medicine | Admitting: Emergency Medicine

## 2023-07-26 DIAGNOSIS — S0181XA Laceration without foreign body of other part of head, initial encounter: Secondary | ICD-10-CM | POA: Diagnosis not present

## 2023-07-26 DIAGNOSIS — W19XXXA Unspecified fall, initial encounter: Secondary | ICD-10-CM

## 2023-07-26 MED ORDER — CEPHALEXIN 500 MG PO CAPS: 500.0000 mg | ORAL_CAPSULE | Freq: Four times a day (QID) | ORAL | 0 refills | Status: AC

## 2023-07-26 MED ORDER — HYDROCODONE-ACETAMINOPHEN 5-325 MG PO TABS
1.0000 | ORAL_TABLET | Freq: Once | ORAL | Status: AC
  Administered 2023-07-26: 1 via ORAL
  Filled 2023-07-26: qty 1

## 2023-07-26 MED ORDER — LIDOCAINE HCL (PF) 1 % IJ SOLN
5.0000 mL | Freq: Once | INTRAMUSCULAR | Status: AC
  Administered 2023-07-26: 5 mL
  Filled 2023-07-26: qty 5

## 2023-07-26 MED ORDER — ACETAMINOPHEN 325 MG PO TABS
650.0000 mg | ORAL_TABLET | Freq: Once | ORAL | Status: AC
  Administered 2023-07-26: 650 mg via ORAL
  Filled 2023-07-26: qty 2

## 2023-07-26 MED ORDER — CEPHALEXIN 500 MG PO CAPS
500.0000 mg | ORAL_CAPSULE | Freq: Once | ORAL | Status: AC
  Administered 2023-07-26: 500 mg via ORAL
  Filled 2023-07-26: qty 1

## 2023-07-26 NOTE — ED Provider Notes (Signed)
[facility] Provider Note    Event Date/Time   First MD Initiated Contact with Patient 07/26/23 0011     (approximate)   History   Head Laceration and Fall   HPI Leslie Smith is a 73 y.o. female who presents for evaluation after mechanical fall.  She stepped off a curb and lost her footing, landing on her forehead and scraping it into the gravel.  Bleeding is well-controlled and she does not take blood thinners.  She did not lose consciousness and has a mild headache around the area of injury but no neck pain.  Her vision is normal.  She denies chest pain and shortness of breath.  No injuries to her arms or her legs.     Physical Exam   Triage Vital Signs: ED Triage Vitals  Encounter Vitals Group     BP 07/25/23 2057 (!) 197/71     Systolic BP Percentile --      Diastolic BP Percentile --      Pulse Rate 07/25/23 2057 83     Resp 07/25/23 2057 18     Temp 07/25/23 2057 98.4 F (36.9 C)     Temp Source 07/25/23 2057 Oral     SpO2 07/25/23 2057 97 %     Weight 07/25/23 2110 133.8 kg (295 lb)     Height 07/25/23 2110 1.676 m (5\' 6" )     Head Circumference --      Peak Flow --      Pain Score 07/26/23 0047 7     Pain Loc --      Pain Education --      Exclude from Growth Chart --     Most recent vital signs: Vitals:   07/26/23 0047 07/26/23 0250  BP: (!) 175/53 (!) 149/46  Pulse: 75 68  Resp: 20 18  Temp: 98.5 F (36.9 C)   SpO2: 97% 99%    General: Awake, no distress despite obvious traumatic injury. Head:  Patient has a 4 cm elliptical laceration on the left upper part of her forehead, bleeding well-controlled, but wound heavily contaminated with gravel.  See procedure note. CV:  Good peripheral perfusion.  Resp:  Normal effort. Speaking easily and comfortably, no accessory muscle usage nor intercostal retractions.   Abd:  No distention.  Other:  Patient is calm, conversant, very pleasant, no concerning psychiatric or neurological  findings.   ED Results / Procedures / Treatments   Labs (all labs ordered are listed, but only abnormal results are displayed) Labs Reviewed  CBC WITH DIFFERENTIAL/PLATELET - Abnormal; Notable for the following components:      Result Value   WBC 11.0 (*)    Neutro Abs 8.2 (*)    All other components within normal limits  BASIC METABOLIC PANEL - Abnormal; Notable for the following components:   Glucose, Bld 115 (*)    Creatinine, Ser 1.03 (*)    GFR, Estimated 58 (*)    All other components within normal limits     EKG  ED ECG REPORT I, Loleta Rose, the attending physician, personally viewed and interpreted this ECG.  Date: 07/25/2023 EKG Time: 21: 15 Rate: 77 Rhythm: normal sinus rhythm QRS Axis: normal Intervals: normal ST/T Wave abnormalities: normal Narrative Interpretation: no evidence of acute ischemia    RADIOLOGY I viewed and interpreted the patient's head CT, cervical spine CT, and right knee x-rays.  Fortunately there is no intracranial bleed, no neck/C-spine injury, no evidence of any  fracture or dislocation of the right knee.  Radiology reports normal findings.   PROCEDURES:  Critical Care performed: No  .Laceration Repair  Date/Time: 07/26/2023 2:54 AM  Performed by: Loleta Rose, MD Authorized by: Loleta Rose, MD   Consent:    Consent obtained:  Verbal   Consent given by:  Patient   Risks discussed:  Infection, need for additional repair, pain, poor cosmetic result, poor wound healing and retained foreign body Universal protocol:    Patient identity confirmed:  Verbally with patient and arm band Anesthesia:    Anesthesia method:  Local infiltration   Local anesthetic:  Lidocaine 1% w/o epi Laceration details:    Location:  Face   Face location:  Forehead   Length (cm):  4.2 Exploration:    Hemostasis achieved with:  Direct pressure   Wound exploration: wound explored through full range of motion and entire depth of wound visualized      Contaminated: yes   Treatment:    Area cleansed with:  Povidone-iodine   Amount of cleaning:  Extensive   Irrigation solution:  Sterile saline   Irrigation volume:  >500 mL   Irrigation method:  Pressure wash   Visualized foreign bodies/material removed: yes     Debridement:  Minimal   Undermining:  None Skin repair:    Repair method:  Sutures   Suture size:  5-0   Wound skin closure material used: Ethilon.   Suture technique:  Simple interrupted   Number of sutures:  6 Approximation:    Approximation:  Close Repair type:    Repair type:  Intermediate Post-procedure details:    Dressing:  Sterile dressing   Procedure completion:  Tolerated well, no immediate complications     IMPRESSION / MDM / ASSESSMENT AND PLAN / ED COURSE  I reviewed the triage vital signs and the nursing notes.                              Differential diagnosis includes, but is not limited to, acute intracranial hemorrhage, skull fracture, C-spine injury, contaminated wound.  Patient's presentation is most consistent with acute presentation with potential threat to life or bodily function.  Labs/studies ordered: CT head, CT cervical spine, knee x-ray, EKG, BMP, CBC w/ diff  Interventions/Medications given:  Medications  HYDROcodone-acetaminophen (NORCO/VICODIN) 5-325 MG per tablet 1 tablet (1 tablet Oral Given 07/26/23 0115)  acetaminophen (TYLENOL) tablet 650 mg (650 mg Oral Given 07/26/23 0115)  lidocaine (PF) (XYLOCAINE) 1 % injection 5 mL (5 mLs Other Given 07/26/23 0220)  cephALEXin (KEFLEX) capsule 500 mg (500 mg Oral Given 07/26/23 0246)    (Note:  hospital course my include additional interventions and/or labs/studies not listed above.)   Extensive contamination of forehead wound but after copious irrigation and manual removal of pieces of gravel with forceps, I believe that I cleaned the vast majority if not all of the contaminant out of the wound.  Sutured as described above.  No  evidence of cardiogenic issue such as syncope, patient is otherwise well and appropriate for discharge.  She agrees with the plan.  She is up-to-date on her tetanus vaccination.  Given the contamination of the wound, I am giving her a course of Keflex.  She will follow-up with her PCP and I gave my usual and customary return precautions.         FINAL CLINICAL IMPRESSION(S) / ED DIAGNOSES   Final diagnoses:  Fall, initial encounter  Laceration of forehead, initial encounter     Rx / DC Orders   ED Discharge Orders          Ordered    cephALEXin (KEFLEX) 500 MG capsule  4 times daily        07/26/23 0251             Note:  This document was prepared using Dragon voice recognition software and may include unintentional dictation errors.   Loleta Rose, MD 07/26/23 [phone]

## 2023-07-26 NOTE — Discharge Instructions (Signed)
As we discussed, fortunately you did not sustain a severe internal injury as result of your fall.  Your wound was very contaminated with gravel, but we believe we were able to wash it out sufficiently to get out all of the extra pieces of foreign material.  Please take the full course of antibiotics as prescribed to lower the risk that you will develop an infection.  Keep your wound clean and dry and you can change the gauze or bandage that covers it twice a day.  It is okay if you shower normally, but it is recommended that you not submerge the wound.  Follow-up with your regular doctor in about a week to 10 days to have the wound reexamined and to have the sutures removed.  Return to the emergency department if you develop new or worsening symptoms that concern you.

## 2023-11-19 ENCOUNTER — Inpatient Hospital Stay: Payer: Medicare Other

## 2023-11-19 ENCOUNTER — Inpatient Hospital Stay: Attending: Oncology

## 2023-11-19 DIAGNOSIS — D693 Immune thrombocytopenic purpura: Secondary | ICD-10-CM

## 2023-11-19 DIAGNOSIS — D649 Anemia, unspecified: Secondary | ICD-10-CM | POA: Diagnosis not present

## 2023-11-19 DIAGNOSIS — R768 Other specified abnormal immunological findings in serum: Secondary | ICD-10-CM

## 2023-11-19 LAB — CBC WITH DIFFERENTIAL (CANCER CENTER ONLY)
Abs Immature Granulocytes: 0.06 10*3/uL (ref 0.00–0.07)
Basophils Absolute: 0.1 10*3/uL (ref 0.0–0.1)
Basophils Relative: 1 %
Eosinophils Absolute: 0.3 10*3/uL (ref 0.0–0.5)
Eosinophils Relative: 3 %
HCT: 35.6 % — ABNORMAL LOW (ref 36.0–46.0)
Hemoglobin: 11.4 g/dL — ABNORMAL LOW (ref 12.0–15.0)
Immature Granulocytes: 1 %
Lymphocytes Relative: 15 %
Lymphs Abs: 1.7 10*3/uL (ref 0.7–4.0)
MCH: 29.6 pg (ref 26.0–34.0)
MCHC: 32 g/dL (ref 30.0–36.0)
MCV: 92.5 fL (ref 80.0–100.0)
Monocytes Absolute: 0.7 10*3/uL (ref 0.1–1.0)
Monocytes Relative: 6 %
Neutro Abs: 8.4 10*3/uL — ABNORMAL HIGH (ref 1.7–7.7)
Neutrophils Relative %: 74 %
Platelet Count: 176 10*3/uL (ref 150–400)
RBC: 3.85 MIL/uL — ABNORMAL LOW (ref 3.87–5.11)
RDW: 14.1 % (ref 11.5–15.5)
WBC Count: 11.2 10*3/uL — ABNORMAL HIGH (ref 4.0–10.5)
nRBC: 0 % (ref 0.0–0.2)

## 2023-11-19 LAB — FERRITIN: Ferritin: 56 ng/mL (ref 11–307)

## 2023-11-19 LAB — IRON AND TIBC
Iron: 75 ug/dL (ref 28–170)
Saturation Ratios: 18 % (ref 10.4–31.8)
TIBC: 417 ug/dL (ref 250–450)
UIBC: 342 ug/dL

## 2023-11-20 LAB — KAPPA/LAMBDA LIGHT CHAINS
Kappa free light chain: 25.6 mg/L — ABNORMAL HIGH (ref 3.3–19.4)
Kappa, lambda light chain ratio: 1.28 (ref 0.26–1.65)
Lambda free light chains: 20 mg/L (ref 5.7–26.3)

## 2024-05-21 ENCOUNTER — Encounter: Payer: Self-pay | Admitting: Oncology

## 2024-05-21 ENCOUNTER — Other Ambulatory Visit: Payer: Self-pay

## 2024-05-21 ENCOUNTER — Inpatient Hospital Stay: Payer: Medicare Other | Admitting: Oncology

## 2024-05-21 ENCOUNTER — Inpatient Hospital Stay: Payer: Medicare Other | Attending: Oncology

## 2024-05-21 VITALS — BP 132/74 | HR 66 | Temp 98.4°F | Resp 18 | Wt 260.0 lb

## 2024-05-21 DIAGNOSIS — D693 Immune thrombocytopenic purpura: Secondary | ICD-10-CM

## 2024-05-21 DIAGNOSIS — I10 Essential (primary) hypertension: Secondary | ICD-10-CM | POA: Diagnosis not present

## 2024-05-21 DIAGNOSIS — R7689 Other specified abnormal immunological findings in serum: Secondary | ICD-10-CM

## 2024-05-21 DIAGNOSIS — G35D Multiple sclerosis, unspecified: Secondary | ICD-10-CM | POA: Diagnosis not present

## 2024-05-21 DIAGNOSIS — Z79899 Other long term (current) drug therapy: Secondary | ICD-10-CM | POA: Insufficient documentation

## 2024-05-21 DIAGNOSIS — D649 Anemia, unspecified: Secondary | ICD-10-CM | POA: Insufficient documentation

## 2024-05-21 DIAGNOSIS — Z809 Family history of malignant neoplasm, unspecified: Secondary | ICD-10-CM | POA: Insufficient documentation

## 2024-05-21 LAB — IRON AND TIBC
Iron: 69 ug/dL (ref 28–170)
Saturation Ratios: 20 % (ref 10.4–31.8)
TIBC: 342 ug/dL (ref 250–450)
UIBC: 273 ug/dL

## 2024-05-21 LAB — CBC WITH DIFFERENTIAL (CANCER CENTER ONLY)
Abs Immature Granulocytes: 0.04 K/uL (ref 0.00–0.07)
Basophils Absolute: 0.1 K/uL (ref 0.0–0.1)
Basophils Relative: 1 %
Eosinophils Absolute: 0.3 K/uL (ref 0.0–0.5)
Eosinophils Relative: 3 %
HCT: 35.8 % — ABNORMAL LOW (ref 36.0–46.0)
Hemoglobin: 11.5 g/dL — ABNORMAL LOW (ref 12.0–15.0)
Immature Granulocytes: 1 %
Lymphocytes Relative: 18 %
Lymphs Abs: 1.4 K/uL (ref 0.7–4.0)
MCH: 29.9 pg (ref 26.0–34.0)
MCHC: 32.1 g/dL (ref 30.0–36.0)
MCV: 93.2 fL (ref 80.0–100.0)
Monocytes Absolute: 0.5 K/uL (ref 0.1–1.0)
Monocytes Relative: 6 %
Neutro Abs: 5.8 K/uL (ref 1.7–7.7)
Neutrophils Relative %: 71 %
Platelet Count: 151 K/uL (ref 150–400)
RBC: 3.84 MIL/uL — ABNORMAL LOW (ref 3.87–5.11)
RDW: 14.1 % (ref 11.5–15.5)
WBC Count: 8 K/uL (ref 4.0–10.5)
nRBC: 0 % (ref 0.0–0.2)

## 2024-05-21 LAB — FERRITIN: Ferritin: 107 ng/mL (ref 11–307)

## 2024-05-21 NOTE — Progress Notes (Signed)
 Hematology/Oncology Consult note [facility]  Telephone:([phone] [phone]  Patient Care Team: Auston Reyes BIRCH, MD as PCP - General (Internal Medicine) Melanee Annah BROCKS, MD as Consulting Physician (Oncology)   Name of the patient: Leslie Smith  [id number]  11-24-1950   Date of visit: 05/21/2024  Diagnosis- chronic ITP Normocytic anemia likely secondary to chronic disease with a component of iron deficiency  Chief complaint/ Reason for visit-routine follow-up of anemia and thrombocytopenia secondary to ITP  Heme/Onc history:  Patient is a 73 year old female with a past medical history significant for hypertension, multiple sclerosis who was referred to us  for thrombocytopenia.  Of note patient has moderate thrombocytopenia at least dating back for the last 5 years.  Her CBC in November 2015 showed white count of 6.7, H&H of 12.8/40.1 and a platelet count of 49.  Since then her platelet count has gone up and down and usually remains between 40s to 60s.  On one occasion in 2017 it did drop down to 19.  Most recent platelet count from 01/04/2019 showed white count of 9.4, H&H of 11.5/35.5 and a platelet count of 62.  Patient has undergone hysterectomy in 2011 as well as ankle surgery in 2019 despite her low platelet count and did not have any significant bleeding issues.     Results of blood work from 01/04/2019 were as follows: CBC showed white count of 9.4, H&H 11.5/35.5 and a platelet count of 62.  B12 level is low at 219.  Folate was normal.  HIV testing was negative.  Smear review was unremarkable and H. pylori stool antigen was negative  Interval history- overall she is doing well. She is trying to lose weight. Denies any blood in stool or urine. Denies significant bleeding or bruising  ECOG PS- 1 Pain scale- 0   Review of systems- Review of Systems  Constitutional:  Negative for chills, fever, malaise/fatigue and weight loss.  HENT:  Negative  for congestion, ear discharge and nosebleeds.   Eyes:  Negative for blurred vision.  Respiratory:  Negative for cough, hemoptysis, sputum production, shortness of breath and wheezing.   Cardiovascular:  Negative for chest pain, palpitations, orthopnea and claudication.  Gastrointestinal:  Negative for abdominal pain, blood in stool, constipation, diarrhea, heartburn, melena, nausea and vomiting.  Genitourinary:  Negative for dysuria, flank pain, frequency, hematuria and urgency.  Musculoskeletal:  Negative for back pain, joint pain and myalgias.  Skin:  Negative for rash.  Neurological:  Negative for dizziness, tingling, focal weakness, seizures, weakness and headaches.  Endo/Heme/Allergies:  Does not bruise/bleed easily.  Psychiatric/Behavioral:  Negative for depression and suicidal ideas. The patient does not have insomnia.       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 ovaries had grown attached to her back bone   BRAIN SURGERY  2015   tumor resection-denies seizures   BREAST SURGERY Left 2014   biopsy. negative   HARDWARE REMOVAL Left 05/05/2018   Procedure: HARDWARE REMOVAL- LEFT ANKLE;  Surgeon: Kathlynn Sharper, MD;  Location: [facility] ORS;  Service: Orthopedics;  Laterality: Left;   I & D EXTREMITY Left 08/25/2015   Procedure: IRRIGATION AND  DEBRIDEMENT THUMB;  Surgeon: Prentice Pagan, MD;  Location: [facility] OR;  Service: Orthopedics;  Laterality: Left;   ORIF ANKLE FRACTURE Left 02/20/2018   Procedure: OPEN REDUCTION INTERNAL FIXATION (ORIF) ANKLE FRACTURE;  Surgeon: Kathlynn Sharper, MD;  Location: [facility] ORS;  Service: Orthopedics;  Laterality: Left;   TOTAL KNEE ARTHROPLASTY Right 11/21/2021   Procedure: TOTAL KNEE ARTHROPLASTY;  Surgeon: Leora Lynwood SAUNDERS, MD;  Location: [facility] ORS;  Service: Orthopedics;  Laterality: Right;    Social History   Socioeconomic History   Marital status: Single    Spouse name: Not on file   Number of children: Not on file   Years of education: high school, some college    Highest education level: High school graduate  Occupational History   Occupation: tree surgeon    Comment: disabled d/t MS  Tobacco Use   Smoking status: Never   Smokeless tobacco: Never  Vaping Use   Vaping status: Never Used  Substance and Sexual Activity   Alcohol use: Yes    Comment: occassional   Drug use: Yes    Types: Marijuana    Comment: uses for MS   Sexual activity: Not on file  Other Topics Concern   Not on file  Social History Narrative   Tai chi at [employer]    Social Drivers of Health   Tobacco Use: Low Risk  (01/07/2024)   Received from [facility]   Patient History    Smoking Tobacco Use: Never    Smokeless Tobacco Use: Never    Passive Exposure: Not on file  Financial Resource Strain: Low Risk  (01/07/2024)   Received from [facility]   Overall Financial Resource Strain (CARDIA)    Difficulty of Paying Living Expenses: Not hard at all  Food Insecurity: No Food Insecurity (01/07/2024)   Received from [facility]   Epic    Within the past 12 months, you worried that your food would run out before you got the money to buy more.: Never true    Within the past 12 months, the food you bought just didn't last and you didn't have money to get more.: Never true   Transportation Needs: No Transportation Needs (01/07/2024)   Received from [facility] - Transportation    In the past 12 months, has lack of transportation kept you from medical appointments or from getting medications?: No    Lack of Transportation (Non-Medical): No  Physical Activity: Not on file  Stress: Not on file  Social Connections: Not on file  Intimate Partner Violence: Not on file  Depression (EYV7-0): Not on file  Alcohol Screen: Not on file  Housing: Low Risk  (01/07/2024)   Received from [facility]   Epic    In the last 12 months, was there a time when you were not able to pay the mortgage or rent on time?: No    In the past 12 months, how many times have you moved where you were living?: 0  At any time in the past 12 months, were you homeless or living in a shelter (including now)?: No  Utilities: Not At Risk (01/07/2024)   Received from [facility]   Epic    In the past 12 months has the electric, gas, oil, or water  company threatened to shut off services in your home?: No  Health Literacy: Not on file    Family History  Problem Relation Age of Onset   Heart disease Father    Cancer Father     Current Medications[2]  Physical exam: There were no vitals filed for this visit. Physical Exam Cardiovascular:     Rate and Rhythm: Normal rate and regular rhythm.     Heart sounds: Normal heart sounds.  Pulmonary:     Effort: Pulmonary effort is normal.     Breath sounds: Normal breath sounds.  Skin:    General: Skin is warm and dry.  Neurological:     Mental Status: She is alert and oriented to person, place, and time.      I have personally reviewed labs listed below:    Latest Ref Rng & Units 07/25/2023    9:14 PM  CMP  Glucose 70 - 99 mg/dL 884   BUN 8 - 23 mg/dL 17   Creatinine 9.55 - 1.00 mg/dL 8.96   Sodium 864 - 854 mmol/L 141   Potassium 3.5 - 5.1 mmol/L 3.8   Chloride 98 - 111  mmol/L 104   CO2 22 - 32 mmol/L 24   Calcium 8.9 - 10.3 mg/dL 9.4       Latest Ref Rng & Units 11/19/2023    3:46 PM  CBC  WBC 4.0 - 10.5 K/uL 11.2   Hemoglobin 12.0 - 15.0 g/dL 88.5   Hematocrit 63.9 - 46.0 % 35.6   Platelets 150 - 400 K/uL 176       Assessment and plan- Patient is a 73 y.o. 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  Platelet counts have been normal over the last 1 year.  In the past her platelets had drifted down to 40s.  No intervention needed. - Monitored platelet count annually. - Scheduled follow-up in one year.  Normocytic anemia Chronic mild anemia with stable hemoglobin. Iron studies pending. Prefers oral iron supplementation. No symptomatic anemia or need for therapy escalation. - Repeat hemoglobin and iron studies in six months. - Contact if iron studies are low. - Continue oral iron supplementation as tolerated. - No iron infusions planned unless oral iron is ineffective and iron studies are low.     Elevated free kappa light chain: Free light chain ratio has remained normal.  Continue to monitor on yearly basis    Visit Diagnosis 1. Elevated serum immunoglobulin free light chain level   2. Chronic ITP (idiopathic thrombocytopenia) (HCC)   3. Normocytic anemia      Dr. Annah Skene, MD, MPH [facility] [phone] 05/21/2024 2:05 PM                   [1]  Allergies Allergen Reactions   Ezetimibe  Other (See Comments)    Joint Pain    Watermelon [Citrullus Vulgaris] Diarrhea    Severe diarrhea within 2 minutes of eating   Adhesive [Tape]    Other Nausea And Vomiting    general anesthesia - deathly ill  Atorvastatin Other (See Comments)    Joint pain   Meloxicam Nausea Only  [2]  Current Outpatient Medications:    aspirin  81 MG chewable tablet, Chew 1 tablet (81 mg  total) by mouth 2 (two) times daily. (Patient not taking: Reported on 02/20/2022), Disp: 60 tablet, Rfl: 0   Cholecalciferol  (VITAMIN D -3) 25 MCG (1000 UT) CAPS, Take 1,000 Units by mouth daily., Disp: , Rfl:    docusate sodium  (COLACE) 100 MG capsule, Take 1 capsule (100 mg total) by mouth 2 (two) times daily. (Patient not taking: Reported on 02/20/2022), Disp: 30 capsule, Rfl: 0   ezetimibe  (ZETIA ) 10 MG tablet, Take 10 mg by mouth daily. (Patient not taking: Reported on 02/20/2022), Disp: , Rfl:    FEROSUL 325 (65 Fe) MG tablet, Take by mouth., Disp: , Rfl:    fluticasone (FLONASE) 50 MCG/ACT nasal spray, Place into the nose., Disp: , Rfl:    furosemide  (LASIX ) 20 MG tablet, Take 20 mg by mouth 2 (two) times daily., Disp: , Rfl:    hydrALAZINE  (APRESOLINE ) 50 MG tablet, Take 1 tablet by mouth 2 (two) times daily., Disp: , Rfl:    HYDROcodone -acetaminophen  (NORCO/VICODIN) 5-325 MG tablet, Take 1 tablet by mouth every 4 (four) hours as needed for moderate pain (pain score 4-6). (Patient not taking: Reported on 02/20/2022), Disp: 30 tablet, Rfl: 0   ibuprofen  (IBU) 800 MG tablet, Take 1 tablet (800 mg total) by mouth every 8 (eight) hours as needed for moderate pain. (Patient not taking: Reported on 01/30/2021), Disp: 30 tablet, Rfl: 6   losartan  (COZAAR ) 100 MG tablet, , Disp: , Rfl:    losartan -hydrochlorothiazide  (HYZAAR) 100-25 MG tablet, , Disp: , Rfl:    metoprolol succinate (TOPROL-XL) 50 MG 24 hr tablet, Take 50 mg by mouth daily., Disp: , Rfl:    oxyCODONE  (OXY IR/ROXICODONE ) 5 MG immediate release tablet, Take 1 tablet every 4 hours by oral route. (Patient not taking: Reported on 04/30/2022), Disp: , Rfl:    tiZANidine  (ZANAFLEX ) 4 MG tablet, Take 4 mg by mouth 3 (three) times daily., Disp: , Rfl:    traMADol (ULTRAM) 50 MG tablet, Take 1 tablet every 6 hours by oral route as needed. (Patient not taking: Reported on 02/20/2022), Disp: , Rfl:    vitamin B-12 (CYANOCOBALAMIN ) 1000 MCG tablet, Take  1,000 mcg by mouth daily., Disp: , Rfl:

## 2024-05-24 LAB — KAPPA/LAMBDA LIGHT CHAINS
Kappa free light chain: 26.2 mg/L — ABNORMAL HIGH (ref 3.3–19.4)
Kappa, lambda light chain ratio: 1.49 (ref 0.26–1.65)
Lambda free light chains: 17.6 mg/L (ref 5.7–26.3)

## 2024-11-19 ENCOUNTER — Inpatient Hospital Stay

## 2025-05-20 ENCOUNTER — Inpatient Hospital Stay: Admitting: Oncology

## 2025-05-20 ENCOUNTER — Inpatient Hospital Stay
